# Patient Record
Sex: Female | Born: 1945
Health system: Southern US, Community
[De-identification: ages and names within clinical notes are randomized; demographics above are authoritative.]

## PROBLEM LIST (undated history)

## (undated) DIAGNOSIS — J309 Allergic rhinitis, unspecified: Secondary | ICD-10-CM

## (undated) DIAGNOSIS — I10 Essential (primary) hypertension: Secondary | ICD-10-CM

## (undated) DIAGNOSIS — K219 Gastro-esophageal reflux disease without esophagitis: Secondary | ICD-10-CM

## (undated) DIAGNOSIS — D649 Anemia, unspecified: Secondary | ICD-10-CM

## (undated) DIAGNOSIS — C8590 Non-Hodgkin lymphoma, unspecified, unspecified site: Secondary | ICD-10-CM

## (undated) DIAGNOSIS — M109 Gout, unspecified: Secondary | ICD-10-CM

## (undated) DIAGNOSIS — R7689 Other specified abnormal immunological findings in serum: Secondary | ICD-10-CM

## (undated) DIAGNOSIS — C801 Malignant (primary) neoplasm, unspecified: Secondary | ICD-10-CM

## (undated) DIAGNOSIS — E079 Disorder of thyroid, unspecified: Secondary | ICD-10-CM

## (undated) DIAGNOSIS — IMO0002 Reserved for concepts with insufficient information to code with codable children: Secondary | ICD-10-CM

## (undated) DIAGNOSIS — R768 Other specified abnormal immunological findings in serum: Secondary | ICD-10-CM

## (undated) HISTORY — PX: COLON SURGERY: SHX602

## (undated) HISTORY — PX: TUBAL LIGATION: SHX77

## (undated) HISTORY — DX: Disorder of thyroid, unspecified: E07.9

## (undated) HISTORY — DX: Anemia, unspecified: D64.9

## (undated) HISTORY — DX: Essential (primary) hypertension: I10

## (undated) HISTORY — DX: Allergic rhinitis, unspecified: J30.9

## (undated) HISTORY — PX: ABDOMINAL HYSTERECTOMY: SHX81

## (undated) HISTORY — DX: Gout, unspecified: M10.9

## (undated) HISTORY — DX: Reserved for concepts with insufficient information to code with codable children: IMO0002

## (undated) HISTORY — PX: PARTIAL COLECTOMY: SHX5273

## (undated) HISTORY — DX: Other specified abnormal immunological findings in serum: R76.89

## (undated) HISTORY — PX: BREAST REDUCTION SURGERY: SHX8

## (undated) HISTORY — DX: Gastro-esophageal reflux disease without esophagitis: K21.9

## (undated) HISTORY — DX: Other specified abnormal immunological findings in serum: R76.8

## (undated) HISTORY — DX: Non-Hodgkin lymphoma, unspecified, unspecified site: C85.90

---

## 1998-12-19 ENCOUNTER — Encounter (INDEPENDENT_AMBULATORY_CARE_PROVIDER_SITE_OTHER): Payer: Self-pay | Admitting: Specialist

## 1998-12-19 ENCOUNTER — Other Ambulatory Visit: Admission: RE | Admit: 1998-12-19 | Discharge: 1998-12-19 | Payer: Self-pay | Admitting: Plastic Surgery

## 2001-02-25 ENCOUNTER — Encounter: Payer: Self-pay | Admitting: Internal Medicine

## 2001-05-07 ENCOUNTER — Encounter: Payer: Self-pay | Admitting: Internal Medicine

## 2002-09-16 ENCOUNTER — Other Ambulatory Visit: Admission: RE | Admit: 2002-09-16 | Discharge: 2002-09-16 | Payer: Self-pay | Admitting: Gynecology

## 2005-04-07 ENCOUNTER — Other Ambulatory Visit: Admission: RE | Admit: 2005-04-07 | Discharge: 2005-04-07 | Payer: Self-pay | Admitting: Gynecology

## 2005-07-24 ENCOUNTER — Ambulatory Visit: Payer: Self-pay | Admitting: Internal Medicine

## 2005-07-30 ENCOUNTER — Ambulatory Visit: Payer: Self-pay | Admitting: Internal Medicine

## 2005-08-11 ENCOUNTER — Ambulatory Visit (HOSPITAL_COMMUNITY): Admission: RE | Admit: 2005-08-11 | Discharge: 2005-08-11 | Payer: Self-pay | Admitting: Internal Medicine

## 2005-08-11 IMAGING — US US SOFT TISSUE HEAD/NECK
1 series · 14 of 25 positions shown · non-contrast
Comparison: none

CLINICAL DATA: The patient has a right thyroid nodule.
 THYROID ULTRASOUND:
TECHNIQUE: Ultrasound examination of the thyroid gland and adjacent soft tissue structures was performed.   
 No comparison.

[Series 1: unknown · 0.07mm/px · 14 of 76 slices shown]
[im 1/76]
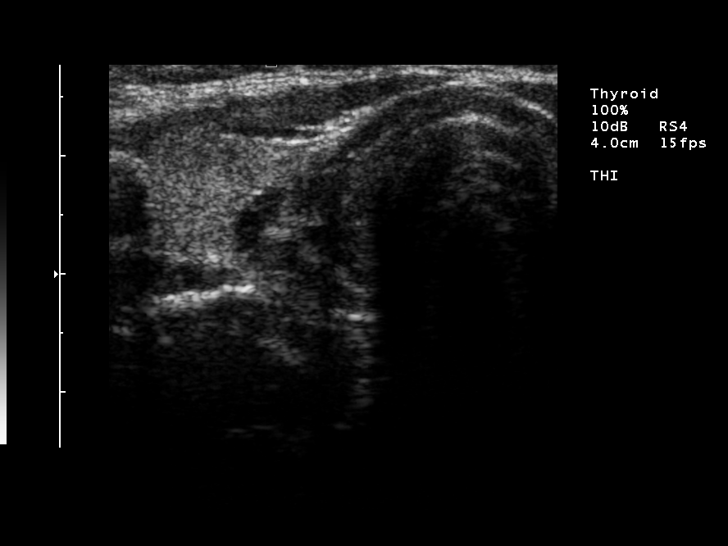
[im 7/76]
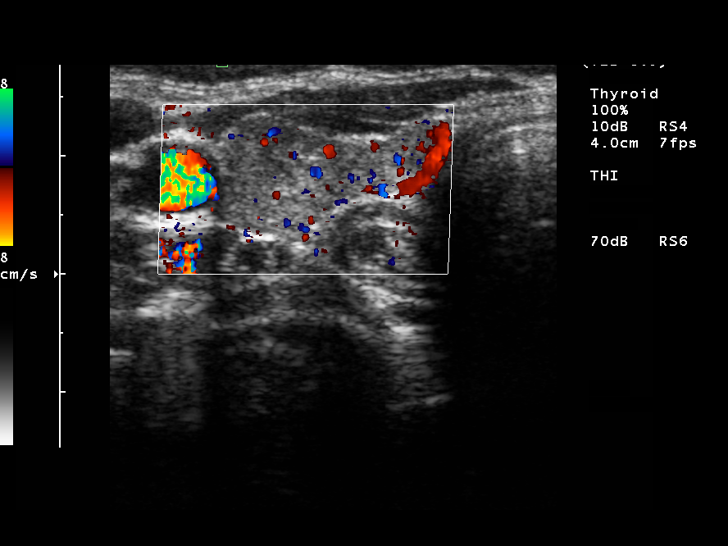
[im 13/76]
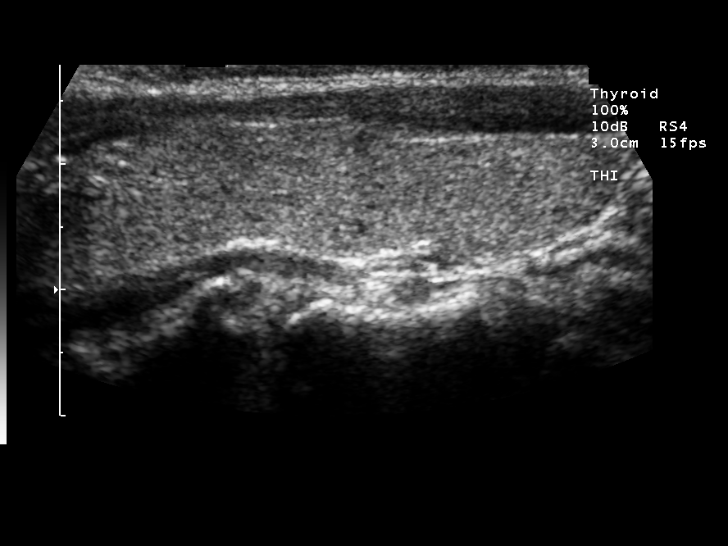
[im 19/76]
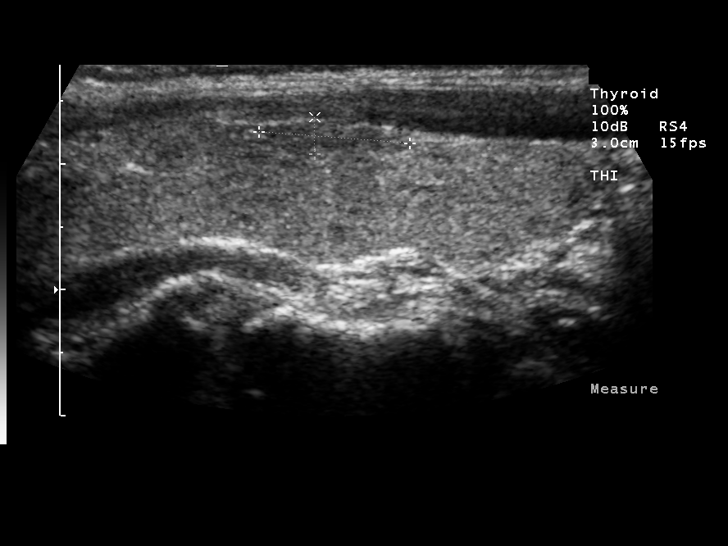
[im 26/76]
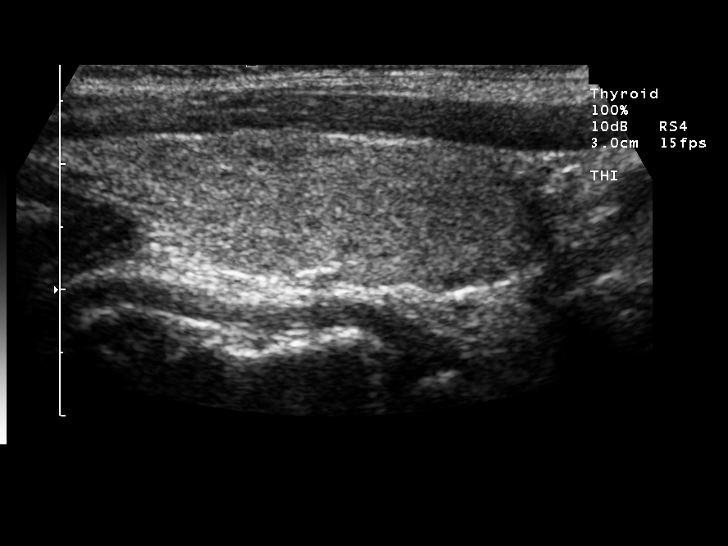
[im 29/76]
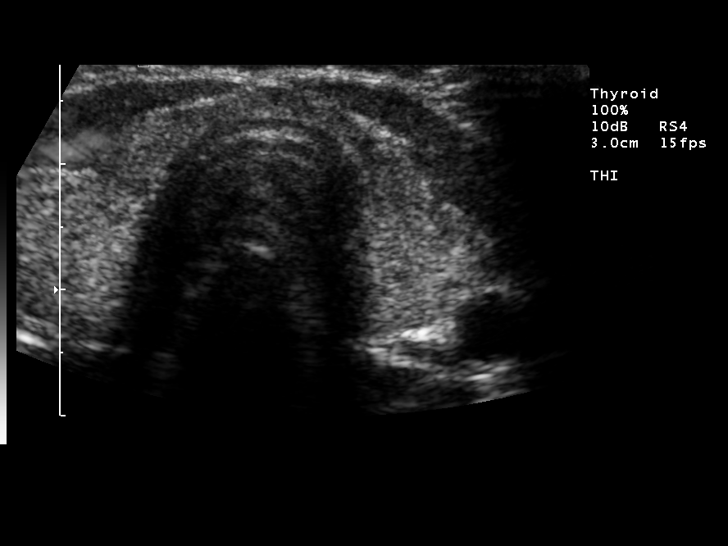
[im 35/76]
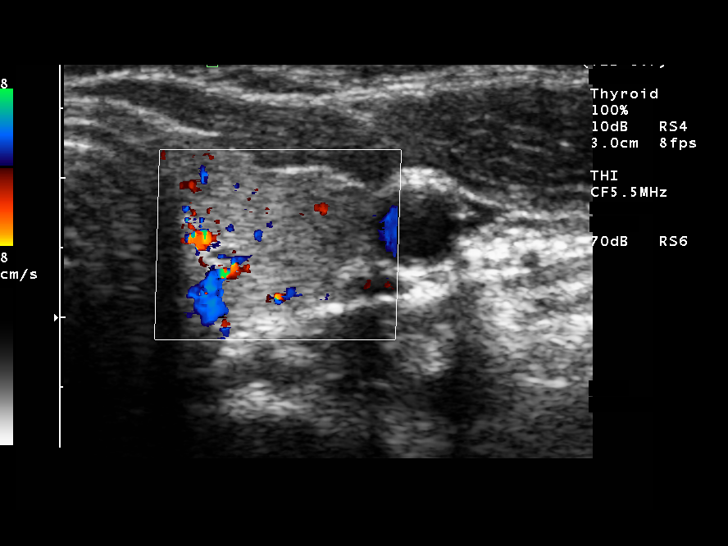
[im 41/76]
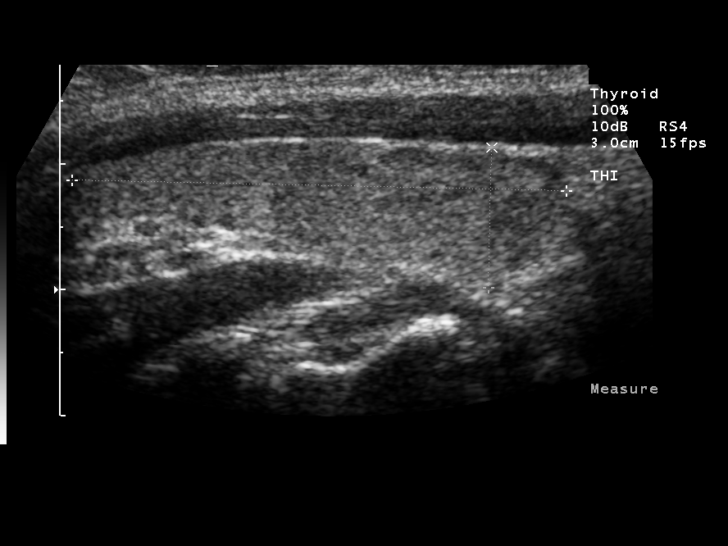
[im 47/76]
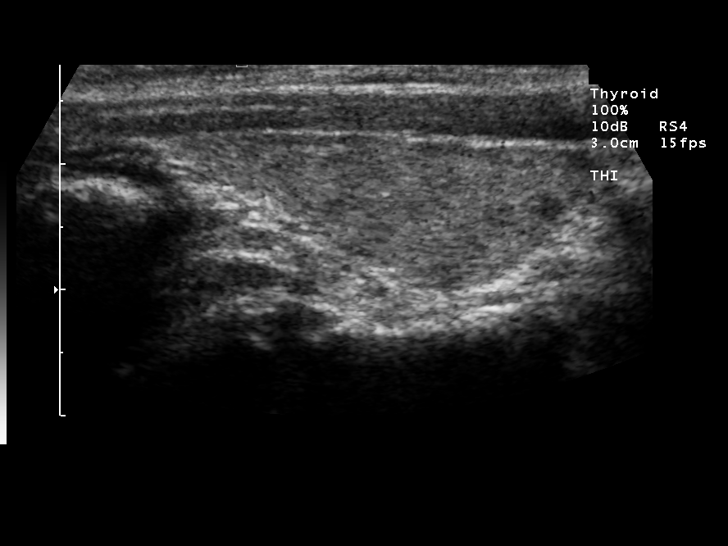
[im 51/76]
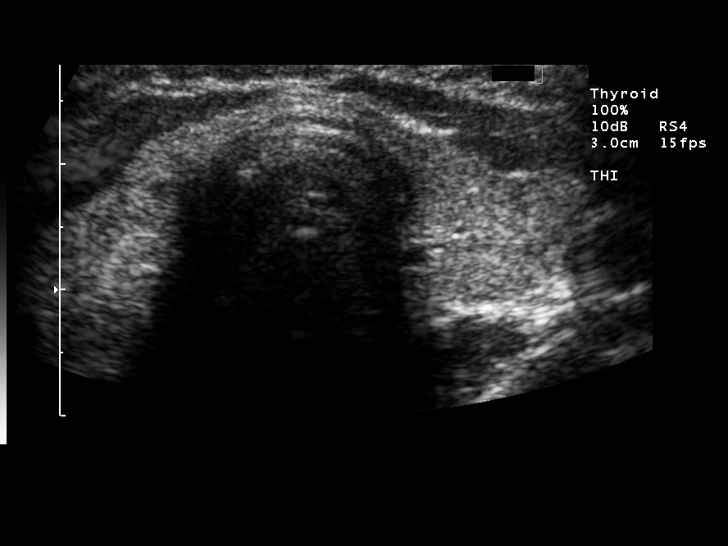
[im 57/76]
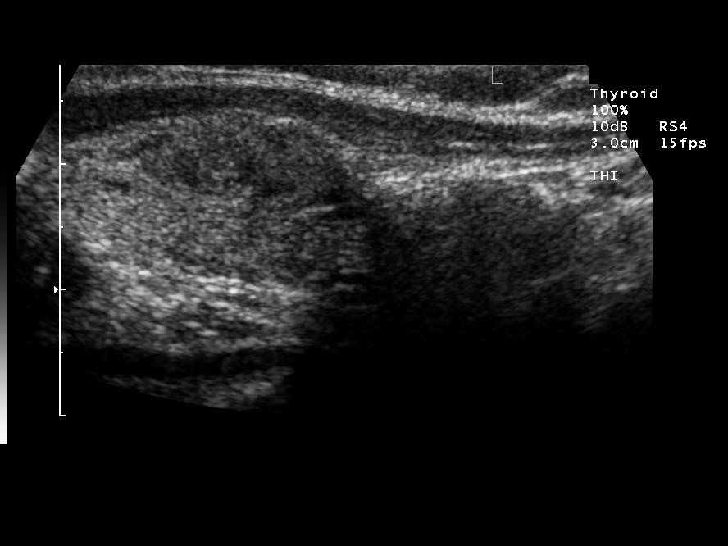
[im 63/76]
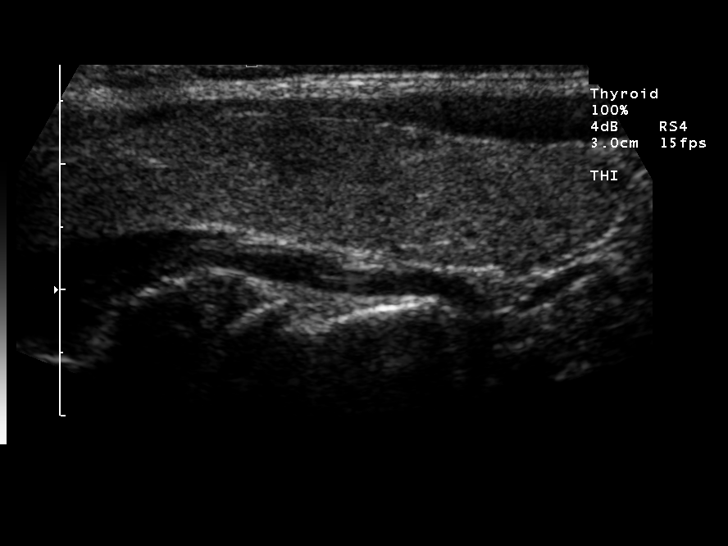
[im 69/76]
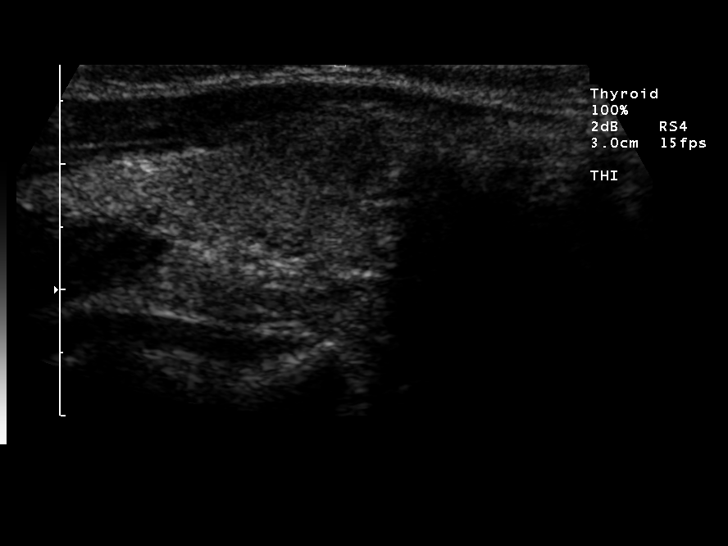
[im 76/76]
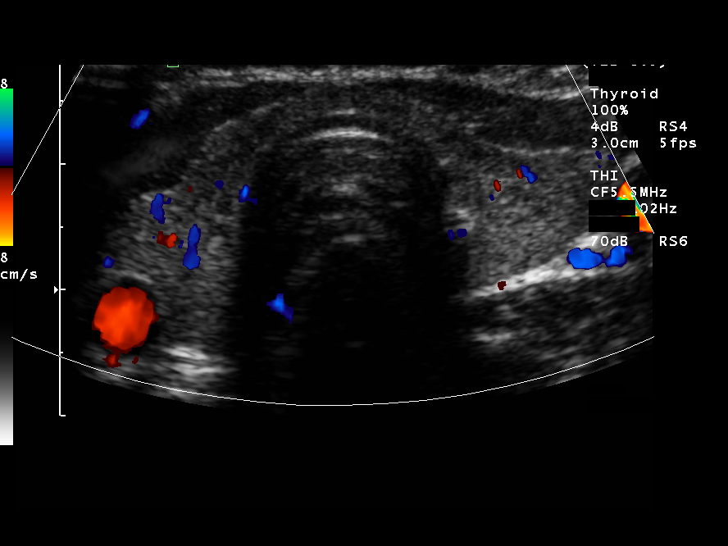

[14 of 25 positions shown; findings below may reference images not displayed]

FINDINGS: The right lobe of the thyroid measures 4.4 x 1.2 x 1.9 cm and the left lobe measures 3.9 x 1.1 x 1.3 cm.  In the right portion of the isthmus of the thyroid, there is a 10 x 6 x 7 mm solid nodule.  In the midportion of the right lobe, there is a 9 x 5 x 8 mm solid nodule.  The initial measurement was 1.6 cm in length however, this is thought to be an erroneous measurement.  
 There is a solid nodule laterally in the right midpole and this nodule measures 9 x 5 x 8 mm.  No discrete nodules are noted on the left.  The echo pattern is inhomogeneous.
IMPRESSION: Two solid nodules in the region of the right lobe, one laterally positioned measuring up to 9 mm and a second being medially positioned in the region of the isthmus measuring up to 10 mm.
 Follow-up ultrasound would be suggested in six months.  If there has been interval growth of these nodules, then biopsy would be suggested.

## 2006-03-06 ENCOUNTER — Ambulatory Visit: Payer: Self-pay | Admitting: Internal Medicine

## 2006-07-01 ENCOUNTER — Ambulatory Visit: Payer: Self-pay | Admitting: Internal Medicine

## 2006-07-24 HISTORY — PX: COLONOSCOPY: SHX174

## 2006-09-07 ENCOUNTER — Other Ambulatory Visit: Admission: RE | Admit: 2006-09-07 | Discharge: 2006-09-07 | Payer: Self-pay | Admitting: Gynecology

## 2007-01-04 ENCOUNTER — Ambulatory Visit: Payer: Self-pay | Admitting: Internal Medicine

## 2007-01-15 ENCOUNTER — Ambulatory Visit: Payer: Self-pay | Admitting: Internal Medicine

## 2007-01-15 LAB — HM COLONOSCOPY: HM Colonoscopy: NORMAL

## 2007-09-10 ENCOUNTER — Other Ambulatory Visit: Admission: RE | Admit: 2007-09-10 | Discharge: 2007-09-10 | Payer: Self-pay | Admitting: Gynecology

## 2007-09-17 ENCOUNTER — Encounter: Payer: Self-pay | Admitting: Internal Medicine

## 2007-12-01 ENCOUNTER — Encounter: Payer: Self-pay | Admitting: *Deleted

## 2007-12-01 DIAGNOSIS — I10 Essential (primary) hypertension: Secondary | ICD-10-CM

## 2007-12-01 DIAGNOSIS — J309 Allergic rhinitis, unspecified: Secondary | ICD-10-CM | POA: Insufficient documentation

## 2007-12-01 DIAGNOSIS — K921 Melena: Secondary | ICD-10-CM | POA: Insufficient documentation

## 2007-12-01 DIAGNOSIS — Z9189 Other specified personal risk factors, not elsewhere classified: Secondary | ICD-10-CM | POA: Insufficient documentation

## 2008-01-24 ENCOUNTER — Ambulatory Visit: Payer: Self-pay | Admitting: Internal Medicine

## 2008-01-24 DIAGNOSIS — M79609 Pain in unspecified limb: Secondary | ICD-10-CM

## 2008-01-24 LAB — CONVERTED CEMR LAB
ALT: 20 units/L (ref 0–35)
AST: 24 units/L (ref 0–37)
Basophils Absolute: 0 10*3/uL (ref 0.0–0.1)
Basophils Relative: 0.5 % (ref 0.0–3.0)
Calcium: 9.4 mg/dL (ref 8.4–10.5)
Eosinophils Absolute: 0.2 10*3/uL (ref 0.0–0.7)
Eosinophils Relative: 3 % (ref 0.0–5.0)
Glucose, Bld: 83 mg/dL (ref 70–99)
Hemoglobin, Urine: NEGATIVE
Lymphocytes Relative: 38.2 % (ref 12.0–46.0)
Monocytes Absolute: 0.5 10*3/uL (ref 0.1–1.0)
Mucus, UA: NEGATIVE
Neutro Abs: 3 10*3/uL (ref 1.4–7.7)
Nitrite: NEGATIVE
Platelets: 336 10*3/uL (ref 150–400)
Potassium: 4.4 meq/L (ref 3.5–5.1)
RBC / HPF: NONE SEEN
RDW: 13.4 % (ref 11.5–14.6)
Specific Gravity, Urine: 1.025 (ref 1.000–1.03)
TSH: 4.1 microintl units/mL (ref 0.35–5.50)
Total Protein, Urine: NEGATIVE mg/dL
Urine Glucose: NEGATIVE mg/dL

## 2008-03-24 ENCOUNTER — Ambulatory Visit: Payer: Self-pay | Admitting: Internal Medicine

## 2008-03-24 DIAGNOSIS — R109 Unspecified abdominal pain: Secondary | ICD-10-CM | POA: Insufficient documentation

## 2008-03-24 DIAGNOSIS — R61 Generalized hyperhidrosis: Secondary | ICD-10-CM

## 2008-03-26 LAB — CONVERTED CEMR LAB
BUN: 10 mg/dL (ref 6–23)
Basophils Absolute: 0.1 10*3/uL (ref 0.0–0.1)
Crystals: NEGATIVE
GFR calc Af Amer: 94 mL/min
GFR calc non Af Amer: 78 mL/min
HCT: 41.7 % (ref 36.0–46.0)
Lymphocytes Relative: 20 % (ref 12.0–46.0)
MCHC: 34.9 g/dL (ref 30.0–36.0)
Monocytes Absolute: 0.6 10*3/uL (ref 0.1–1.0)
Neutro Abs: 4.7 10*3/uL (ref 1.4–7.7)
Neutrophils Relative %: 70.9 % (ref 43.0–77.0)
Nitrite: NEGATIVE
Platelets: 333 10*3/uL (ref 150–400)
Potassium: 3.5 meq/L (ref 3.5–5.1)
Sed Rate: 46 mm/hr — ABNORMAL HIGH (ref 0–22)
Sodium: 137 meq/L (ref 135–145)
Total Protein, Urine: 30 mg/dL — AB
pH: 5.5 (ref 5.0–8.0)

## 2008-04-18 ENCOUNTER — Encounter: Payer: Self-pay | Admitting: Internal Medicine

## 2008-06-12 ENCOUNTER — Telehealth: Payer: Self-pay | Admitting: Internal Medicine

## 2008-06-13 ENCOUNTER — Telehealth: Payer: Self-pay | Admitting: Internal Medicine

## 2008-06-19 ENCOUNTER — Ambulatory Visit: Payer: Self-pay | Admitting: Internal Medicine

## 2008-06-19 DIAGNOSIS — R198 Other specified symptoms and signs involving the digestive system and abdomen: Secondary | ICD-10-CM | POA: Insufficient documentation

## 2008-09-18 ENCOUNTER — Ambulatory Visit: Payer: Self-pay | Admitting: Women's Health

## 2008-09-18 ENCOUNTER — Encounter: Payer: Self-pay | Admitting: Women's Health

## 2008-09-18 ENCOUNTER — Other Ambulatory Visit: Admission: RE | Admit: 2008-09-18 | Discharge: 2008-09-18 | Payer: Self-pay | Admitting: Gynecology

## 2008-09-29 ENCOUNTER — Telehealth: Payer: Self-pay | Admitting: Internal Medicine

## 2008-09-29 ENCOUNTER — Encounter: Payer: Self-pay | Admitting: Internal Medicine

## 2008-12-21 ENCOUNTER — Ambulatory Visit: Payer: Self-pay | Admitting: Internal Medicine

## 2008-12-21 DIAGNOSIS — M549 Dorsalgia, unspecified: Secondary | ICD-10-CM

## 2009-03-19 ENCOUNTER — Ambulatory Visit: Payer: Self-pay | Admitting: Internal Medicine

## 2009-03-19 DIAGNOSIS — R609 Edema, unspecified: Secondary | ICD-10-CM | POA: Insufficient documentation

## 2009-05-07 ENCOUNTER — Encounter: Payer: Self-pay | Admitting: Internal Medicine

## 2009-11-05 ENCOUNTER — Other Ambulatory Visit: Admission: RE | Admit: 2009-11-05 | Discharge: 2009-11-05 | Payer: Self-pay | Admitting: Gynecology

## 2009-11-05 ENCOUNTER — Ambulatory Visit: Payer: Self-pay | Admitting: Women's Health

## 2009-11-23 ENCOUNTER — Ambulatory Visit: Payer: Self-pay | Admitting: Internal Medicine

## 2009-11-23 DIAGNOSIS — R110 Nausea: Secondary | ICD-10-CM | POA: Insufficient documentation

## 2009-11-23 DIAGNOSIS — A048 Other specified bacterial intestinal infections: Secondary | ICD-10-CM | POA: Insufficient documentation

## 2009-11-23 DIAGNOSIS — R1013 Epigastric pain: Secondary | ICD-10-CM

## 2009-11-24 LAB — CONVERTED CEMR LAB
AST: 18 units/L (ref 0–37)
Albumin: 3.8 g/dL (ref 3.5–5.2)
Alkaline Phosphatase: 63 units/L (ref 39–117)
BUN: 10 mg/dL (ref 6–23)
Basophils Relative: 0.4 % (ref 0.0–3.0)
Bilirubin, Direct: 0.1 mg/dL (ref 0.0–0.3)
CO2: 30 meq/L (ref 19–32)
Chloride: 106 meq/L (ref 96–112)
Eosinophils Absolute: 0 10*3/uL (ref 0.0–0.7)
Eosinophils Relative: 0.7 % (ref 0.0–5.0)
GFR calc non Af Amer: 110.98 mL/min (ref >60)
H Pylori IgG: POSITIVE
Ketones, ur: NEGATIVE mg/dL
Lipase: 1 units/L — ABNORMAL LOW (ref 11.0–59.0)
Lymphocytes Relative: 28.6 % (ref 12.0–46.0)
Lymphs Abs: 1.7 10*3/uL (ref 0.7–4.0)
Monocytes Absolute: 0.5 10*3/uL (ref 0.1–1.0)
Monocytes Relative: 8.3 % (ref 3.0–12.0)
Neutrophils Relative %: 62 % (ref 43.0–77.0)
Sodium: 143 meq/L (ref 135–145)
Specific Gravity, Urine: 1.02 (ref 1.000–1.030)
Total Protein, Urine: NEGATIVE mg/dL
pH: 5.5 (ref 5.0–8.0)

## 2009-12-11 ENCOUNTER — Ambulatory Visit: Payer: Self-pay | Admitting: Women's Health

## 2009-12-12 ENCOUNTER — Encounter (INDEPENDENT_AMBULATORY_CARE_PROVIDER_SITE_OTHER): Payer: Self-pay | Admitting: *Deleted

## 2009-12-12 ENCOUNTER — Encounter: Admission: RE | Admit: 2009-12-12 | Discharge: 2009-12-12 | Payer: Self-pay | Admitting: Internal Medicine

## 2009-12-12 IMAGING — US US ABDOMEN COMPLETE
1 series · 14 of 25 positions shown · non-contrast
Comparison: None.

CLINICAL DATA: Nausea/epigastric and abdominal pain.

COMPLETE ABDOMINAL ULTRASOUND

[Series 1: us abdomen complete · 0.30mm/px · 14 of 77 slices shown]
[im 1/77]
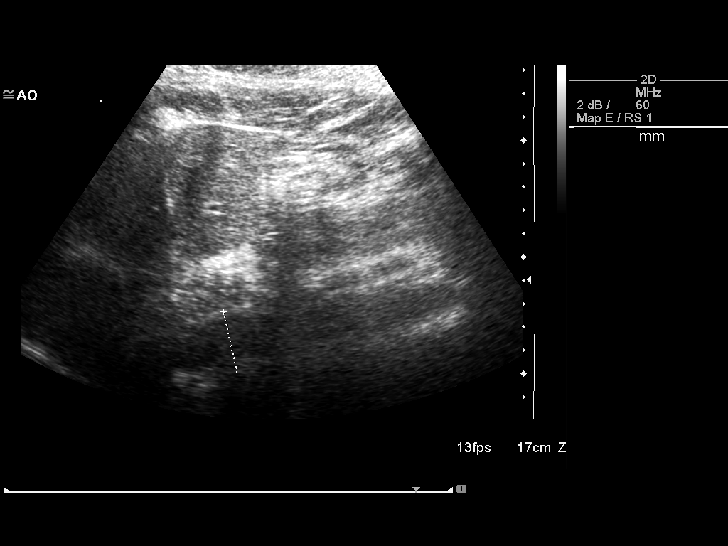
[im 7/77]
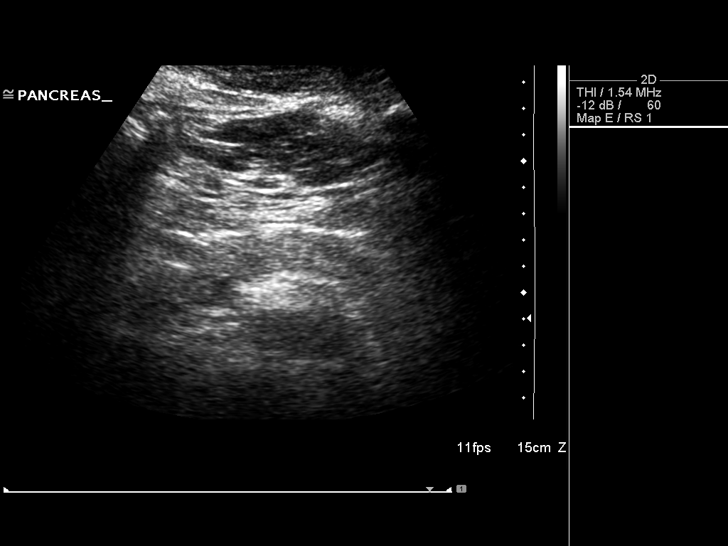
[im 13/77]
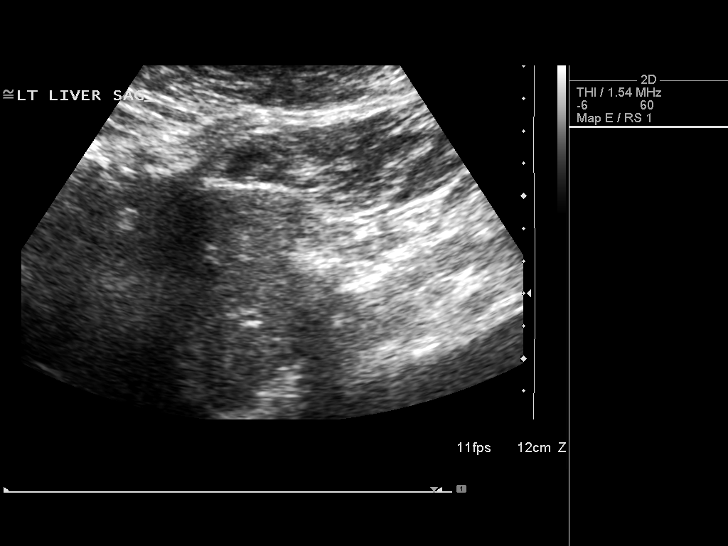
[im 20/77]
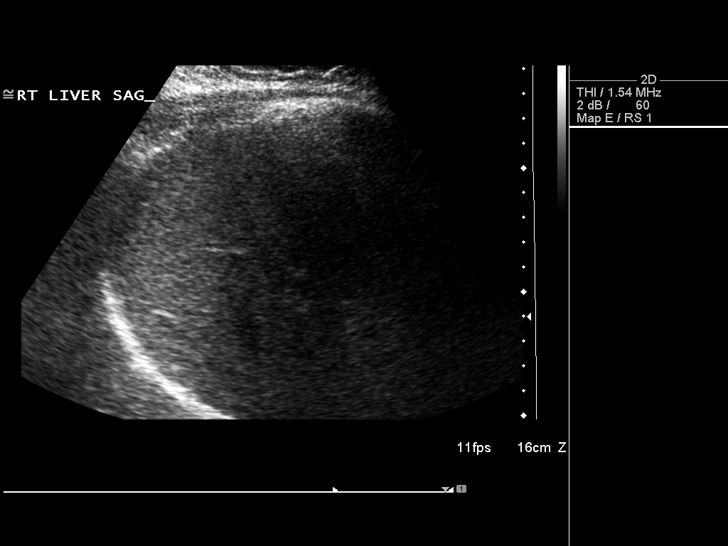
[im 26/77]
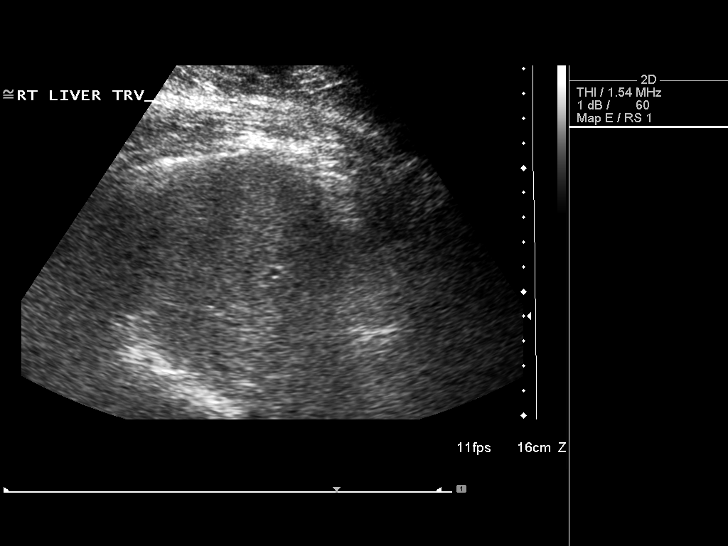
[im 29/77]
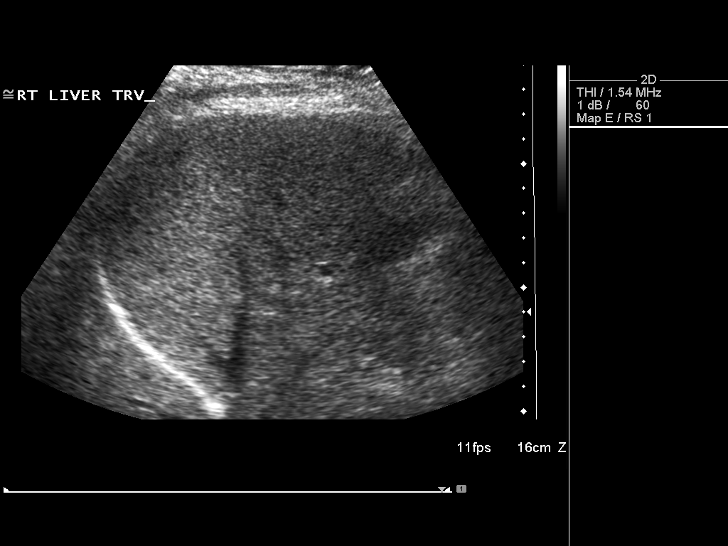
[im 35/77]
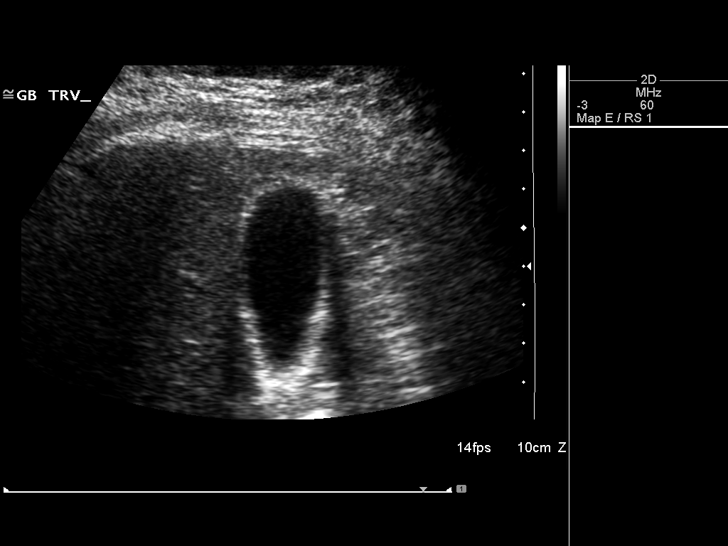
[im 42/77]
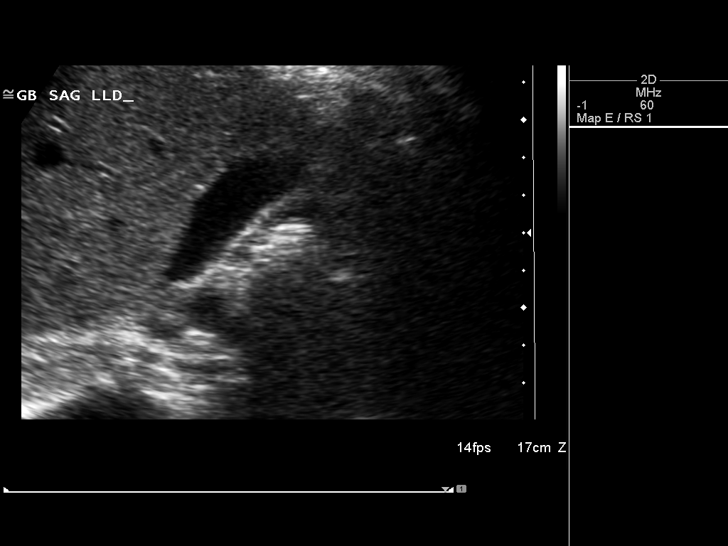
[im 48/77]
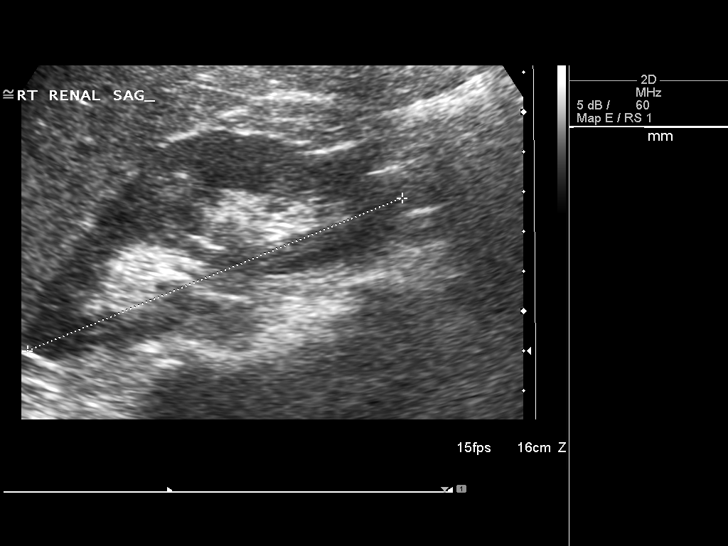
[im 51/77]
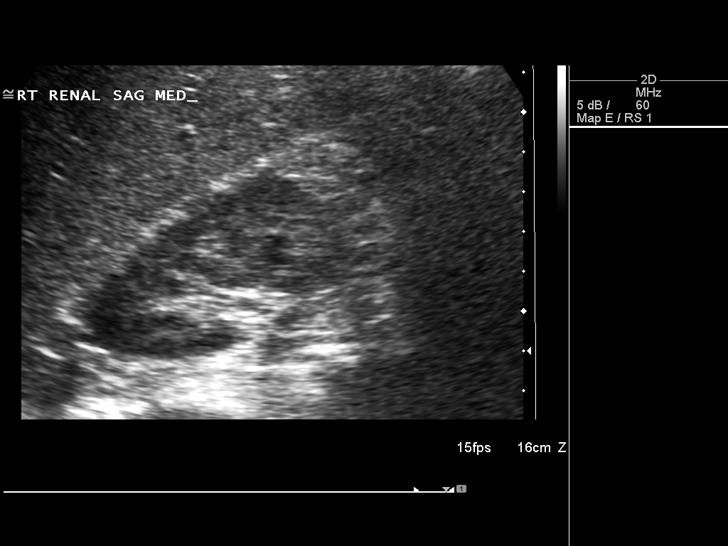
[im 58/77]
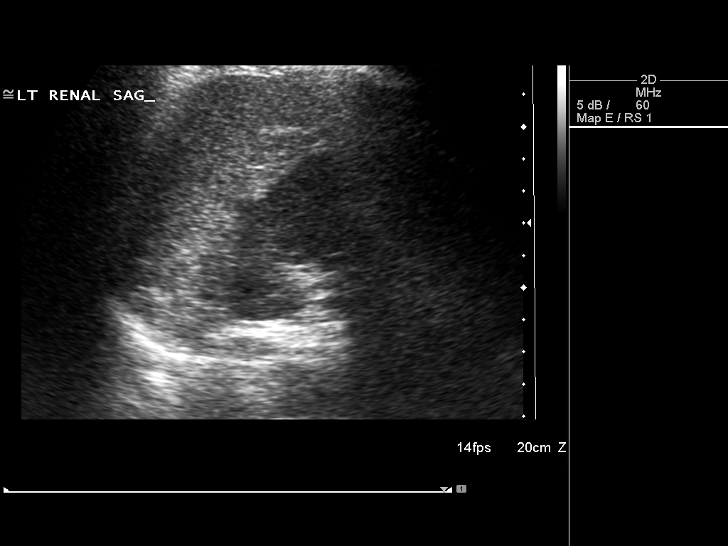
[im 64/77]
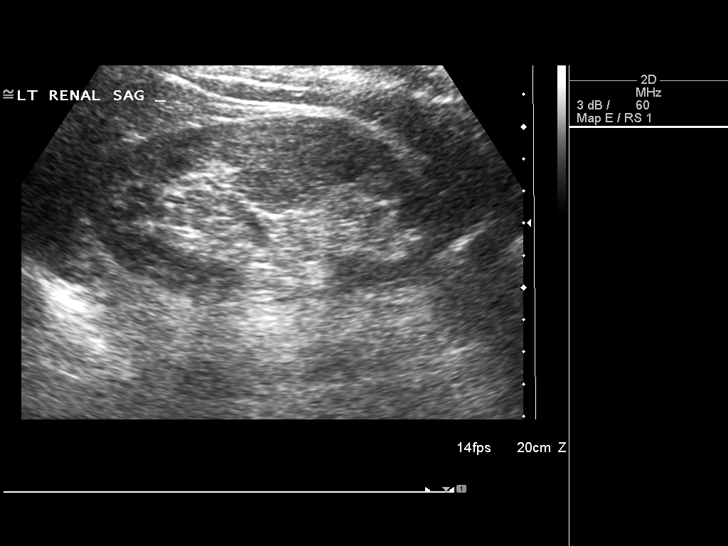
[im 70/77]
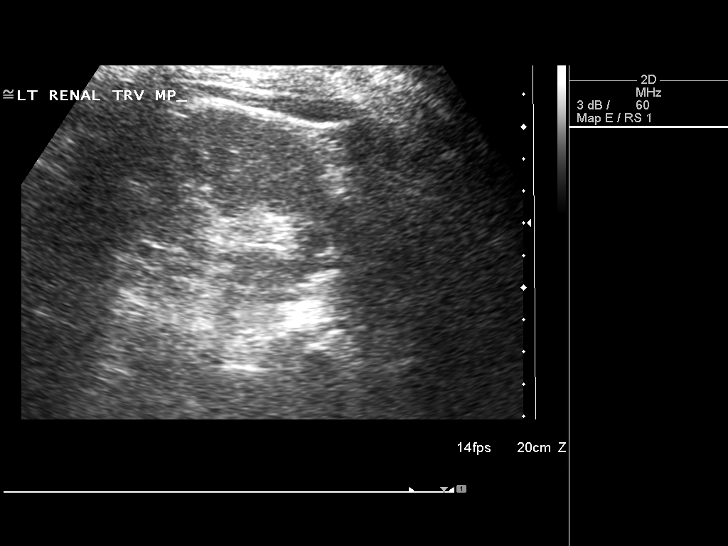
[im 77/77]
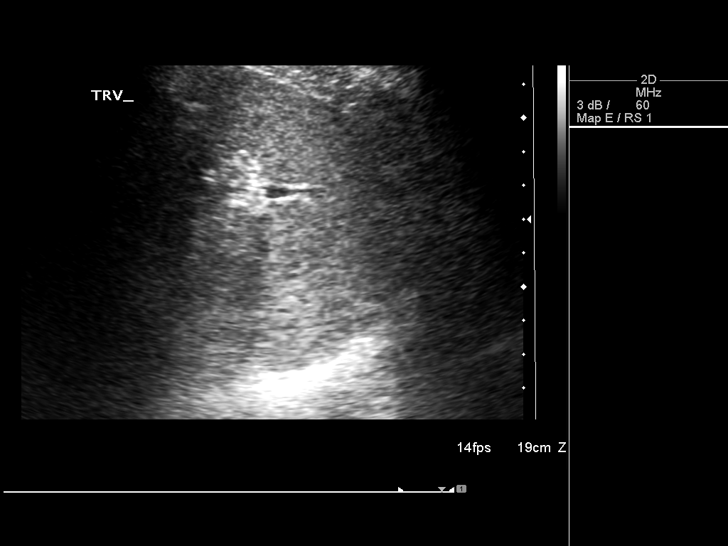

[14 of 25 positions shown; findings below may reference images not displayed]

FINDINGS: Gallbladder:  No gallstones, gallbladder wall thickening, or
pericholecystic fluid.

Common bile duct:  Measures 4.5 mm in diameter.

Liver:  No focal lesion identified.  Within normal limits in
parenchymal echogenicity.

IVC:  Appears normal.

Pancreas:  No focal abnormality seen.

Spleen:  Measures 6.2 cm in length.  No splenic lesions.

Right Kidney:  Measures 10.2 cm in length.  No renal mass or
hydronephrosis.

Left Kidney:  Measures 11.8 cm in length.  No renal mass or
hydronephrosis.

Abdominal aorta:  No aneurysm identified. Maximum diameter
abdominal aorta is 2.6 cm.
IMPRESSION: Negative abdominal ultrasound.

## 2009-12-21 DIAGNOSIS — C8590 Non-Hodgkin lymphoma, unspecified, unspecified site: Secondary | ICD-10-CM

## 2009-12-21 HISTORY — DX: Non-Hodgkin lymphoma, unspecified, unspecified site: C85.90

## 2009-12-28 ENCOUNTER — Encounter (INDEPENDENT_AMBULATORY_CARE_PROVIDER_SITE_OTHER): Payer: Self-pay | Admitting: *Deleted

## 2009-12-28 ENCOUNTER — Ambulatory Visit: Payer: Self-pay | Admitting: Internal Medicine

## 2009-12-28 DIAGNOSIS — K649 Unspecified hemorrhoids: Secondary | ICD-10-CM | POA: Insufficient documentation

## 2009-12-28 DIAGNOSIS — D509 Iron deficiency anemia, unspecified: Secondary | ICD-10-CM

## 2009-12-29 ENCOUNTER — Inpatient Hospital Stay (HOSPITAL_COMMUNITY)
Admission: EM | Admit: 2009-12-29 | Discharge: 2010-01-02 | Payer: Self-pay | Source: Home / Self Care | Admitting: Emergency Medicine

## 2009-12-29 ENCOUNTER — Encounter (INDEPENDENT_AMBULATORY_CARE_PROVIDER_SITE_OTHER): Payer: Self-pay | Admitting: *Deleted

## 2009-12-29 IMAGING — CT CT ABD-PELV W/ CM
2 of 5 series · 17 of 46 positions shown, 19 images · IV contrast (APPLIED)
Comparison: None

CLINICAL DATA: Abdominal pain, fever, history diverticulitis

CT ABDOMEN AND PELVIS WITH CONTRAST
TECHNIQUE: Multidetector CT imaging of the abdomen and pelvis was
performed following the standard protocol during bolus
administration of intravenous contrast. Breast shield utilized.
Sagittal and coronal MPR images reconstructed from axial data set.
Contrast: Dilute oral contrast and 125 ml [I7] IV

[Series 2: abd_pel 5.0 b40f st · axial · 0.73mm/px · z∈[-275,+115]mm · 14 of 88 slices shown, 16 images]
[im 5/88  soft-tissue]
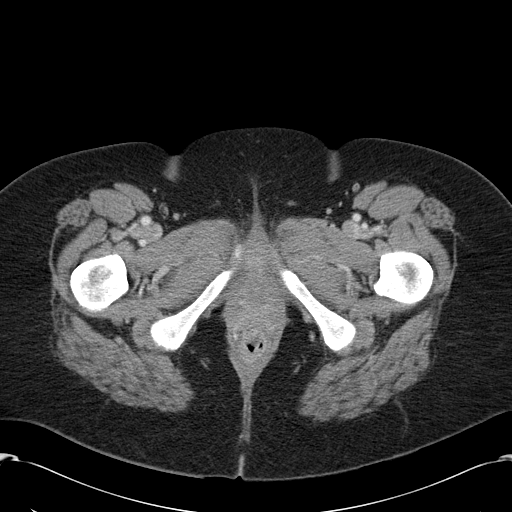
[im 5/88  bone]
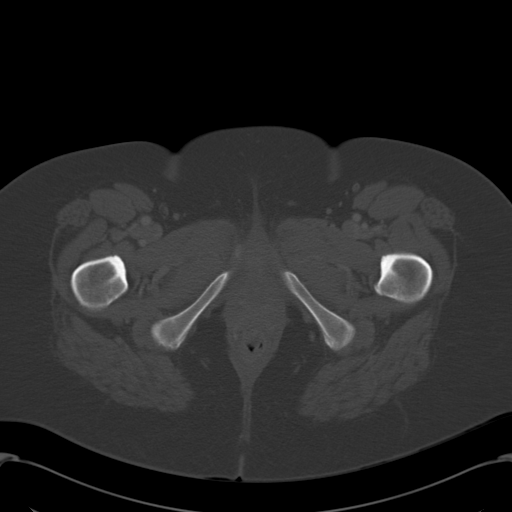
[im 14/88  soft-tissue]
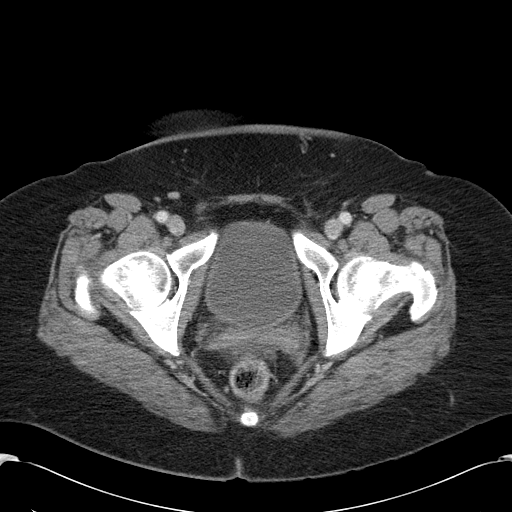
[im 18/88  soft-tissue]
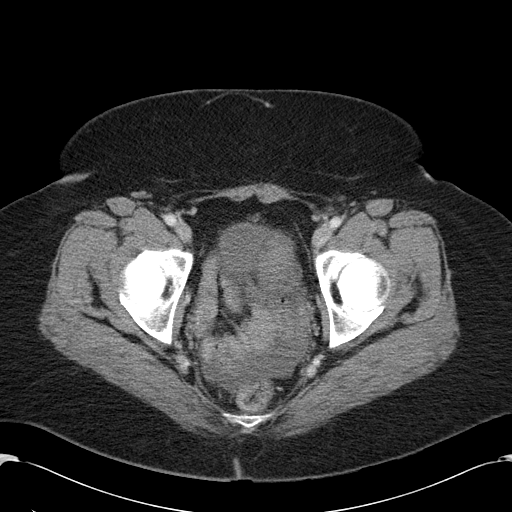
[im 22/88  soft-tissue]
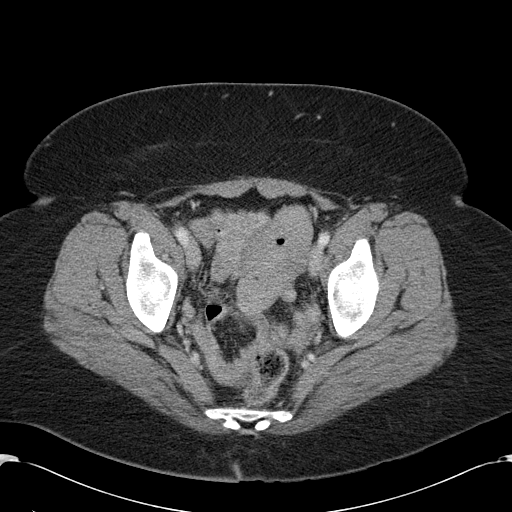
[im 31/88  soft-tissue]
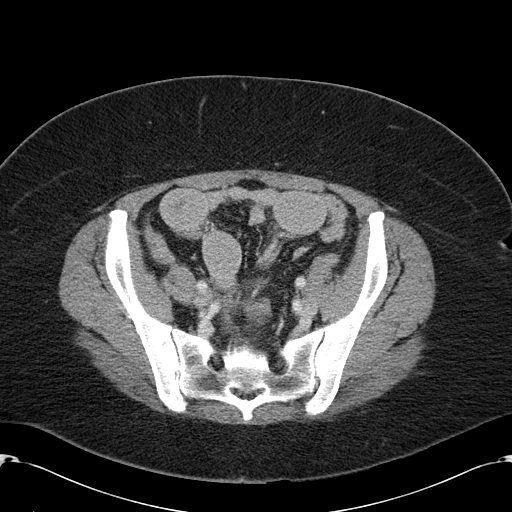
[im 35/88  soft-tissue]
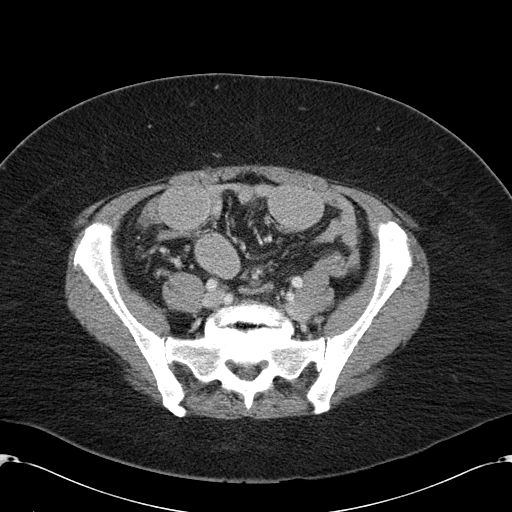
[im 40/88  soft-tissue]
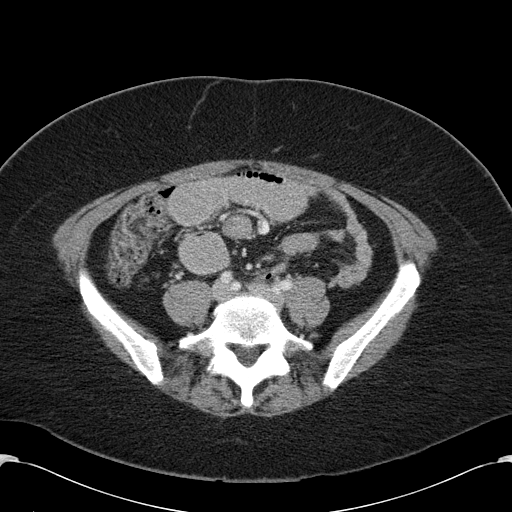
[im 48/88  soft-tissue]
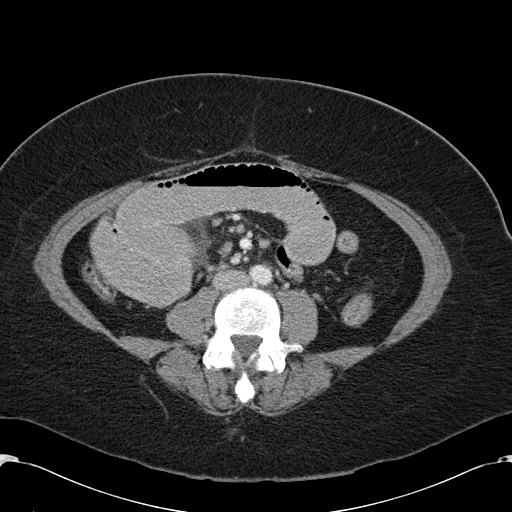
[im 53/88  soft-tissue]
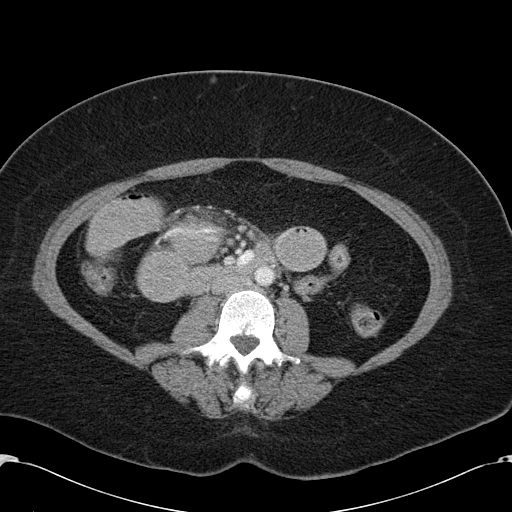
[im 53/88  bone]
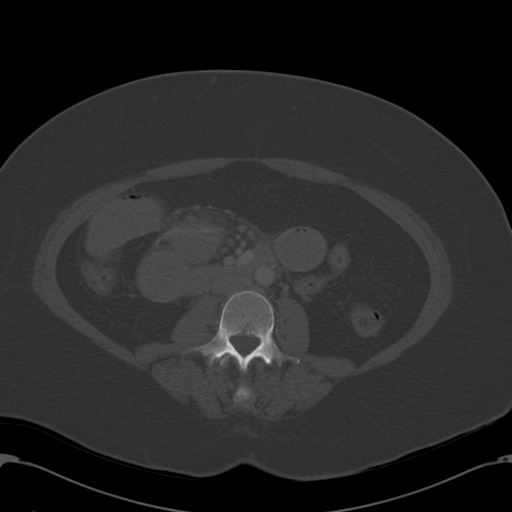
[im 57/88  soft-tissue]
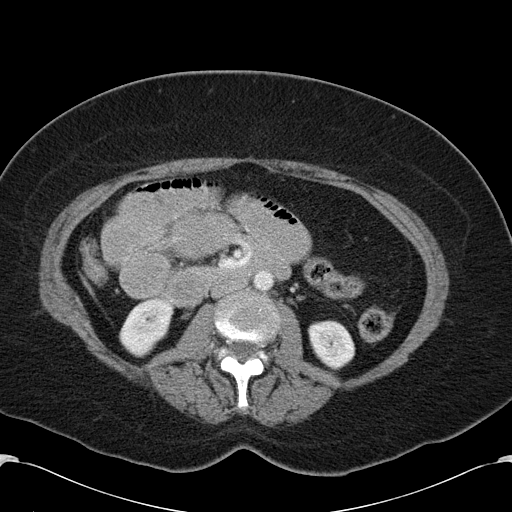
[im 66/88  soft-tissue]
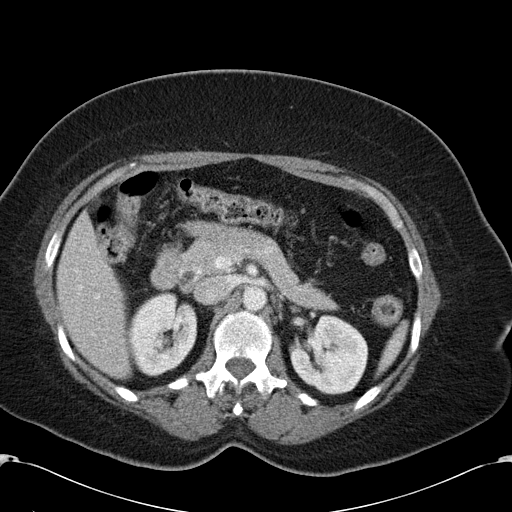
[im 70/88  soft-tissue]
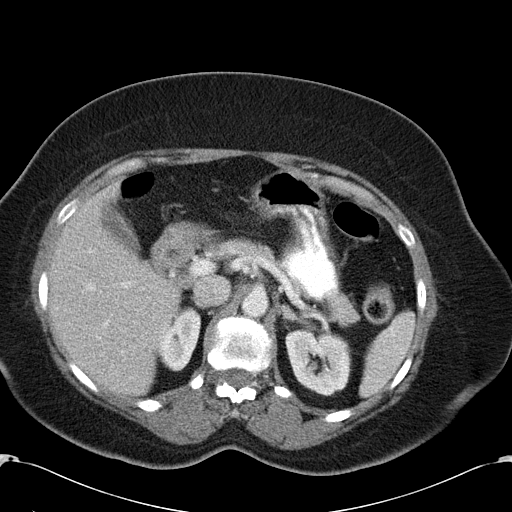
[im 74/88  soft-tissue]
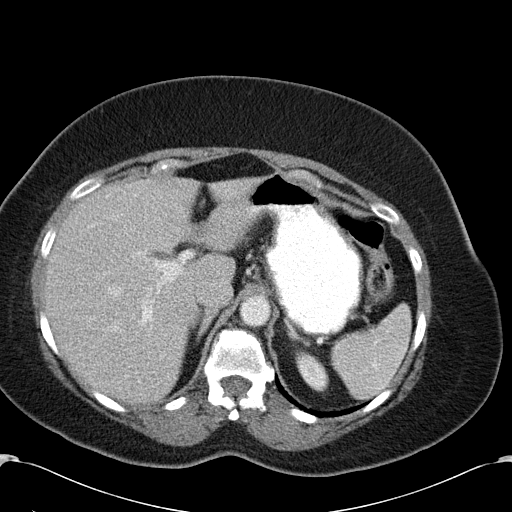
[im 83/88  soft-tissue]
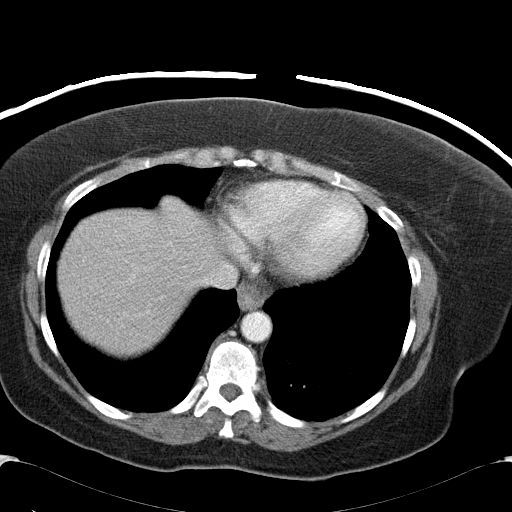

[Series 602: coronal · coronal · 0.90mm/px · 3 of 71 slices shown]
[im 24/71  soft-tissue]
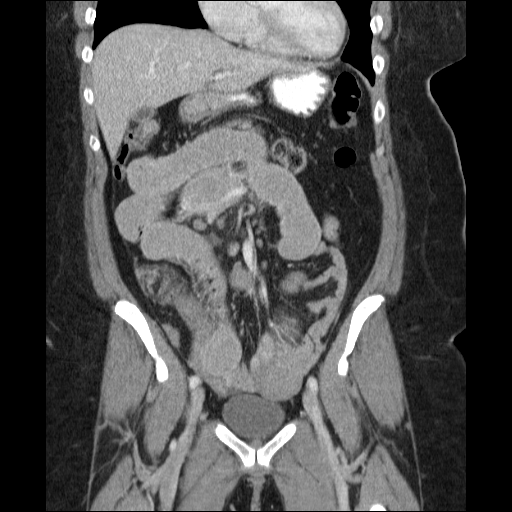
[im 32/71  soft-tissue]
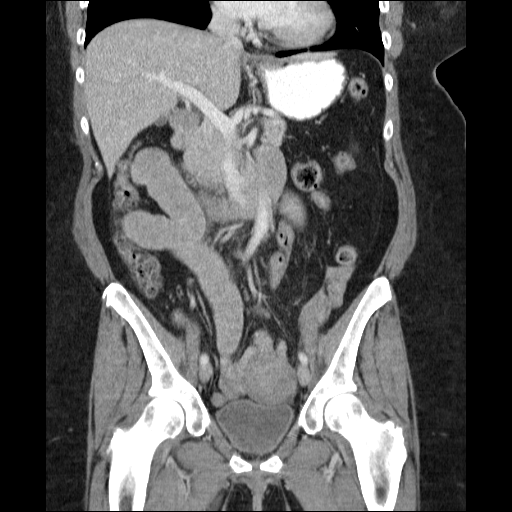
[im 39/71  soft-tissue]
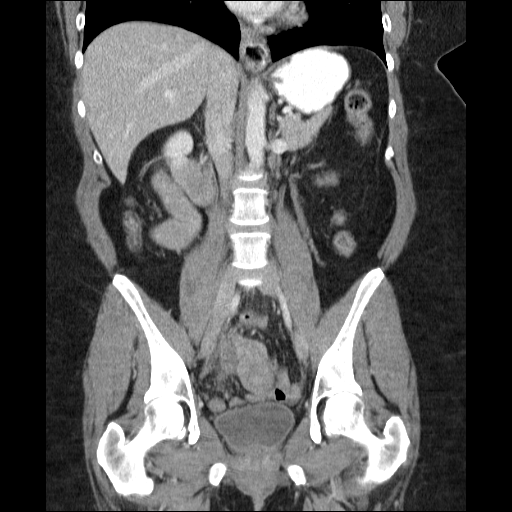

[17 of 46 positions shown; findings below may reference images not displayed]

FINDINGS: Lung bases clear.
Question tiny hiatal hernia.
Liver, spleen, pancreas, kidneys, and left adrenal gland normal.
Questionable tiny nonspecific nodule right adrenal gland 11 x 7 mm
image 14.
Stomach and colon unremarkable.
Small amount of nonspecific free pelvic fluid.
Dilated proximal and decompressed distal small bowel loops
compatible with small bowel obstruction.
Bowel dilatation terminates at a poorly defined soft tissue mass in
the left pelvis, measuring approximately 4.8 x 4.7 x 5.4 cm,
uncertain if this represents a thickened small bowel segment or a
soft tissue mass arising from small bowel, less likely mass of left
ovarian origin.
Small bowel beyond this site is decompressed with normal wall
thickness.
No hernia or adenopathy.
Uterus surgically absent.
No acute bony findings.
IMPRESSION: Mid to distal small bowel obstruction secondary to a soft tissue
mass in the left pelvis, [DATE] x 4.7 x 5.4 cm, question small bowel
mass versus less likely significantly thickened small bowel
segment.
Small amount of nonspecific free pelvic fluid.

## 2009-12-30 ENCOUNTER — Encounter (INDEPENDENT_AMBULATORY_CARE_PROVIDER_SITE_OTHER): Payer: Self-pay | Admitting: *Deleted

## 2009-12-30 IMAGING — US US PELVIS COMPLETE MODIFY
1 series · 13 of 25 positions shown · non-contrast
Comparison: CT scan [DATE].

CLINICAL DATA: A small bowel obstruction.  Evaluate for pelvic
mass.



[Series 1: us pelvis complete modify · 0.31mm/px · 37 acquisitions, 13 frames shown]
[im 1/37]
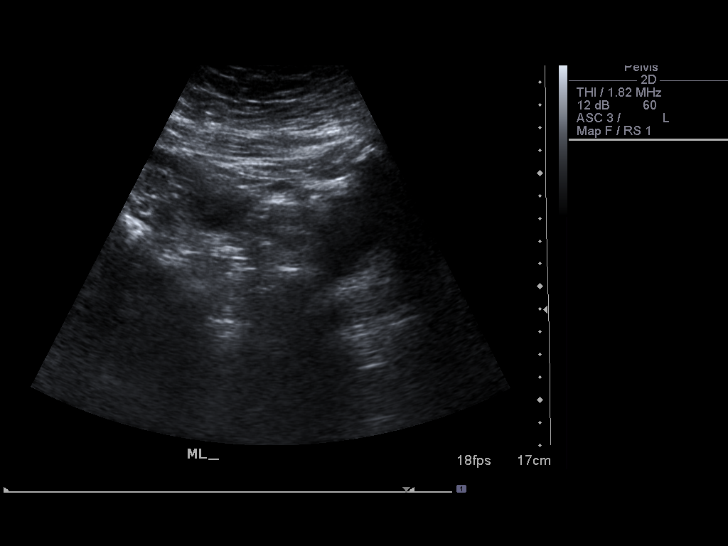
[im 4/37]
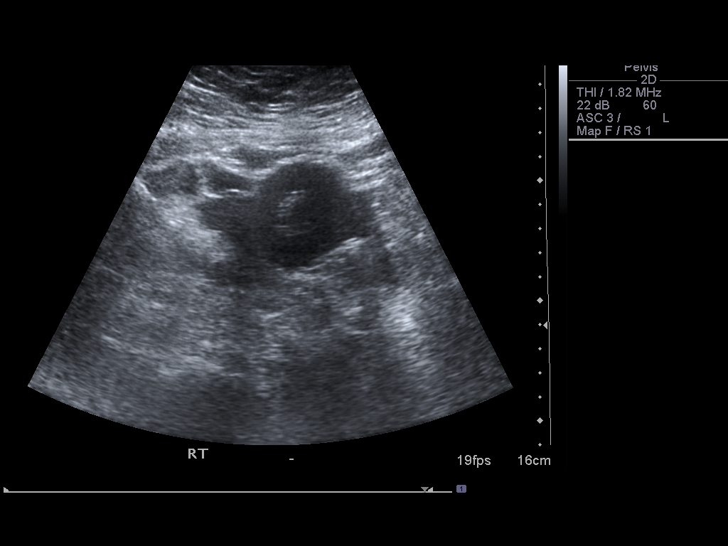
[im 7/37]
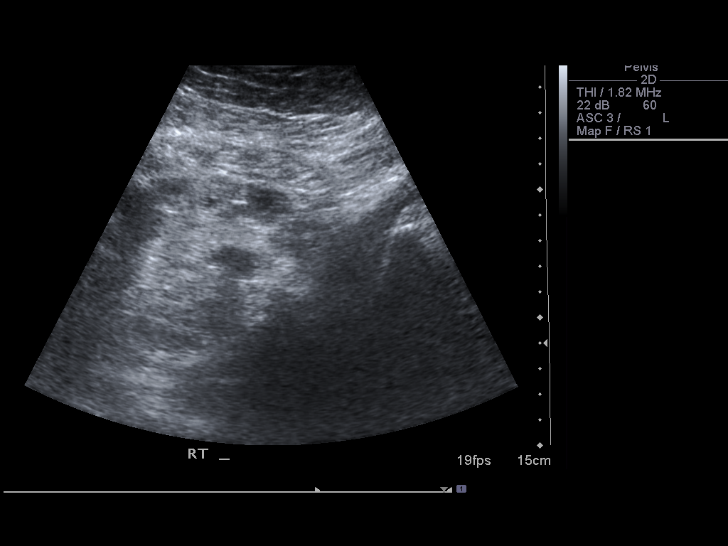
[im 10/37]
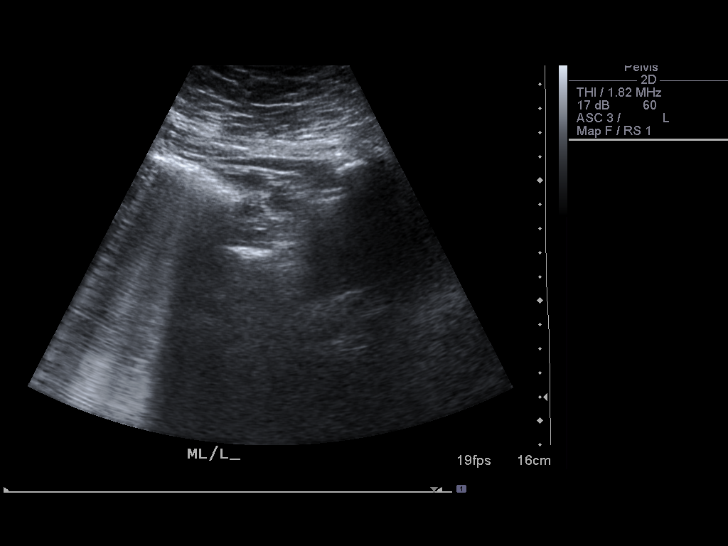
[im 13/37]
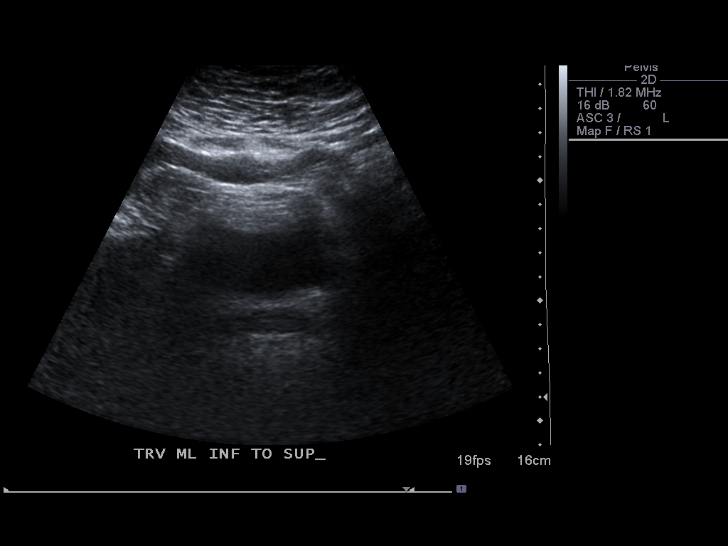
[im 16/37]
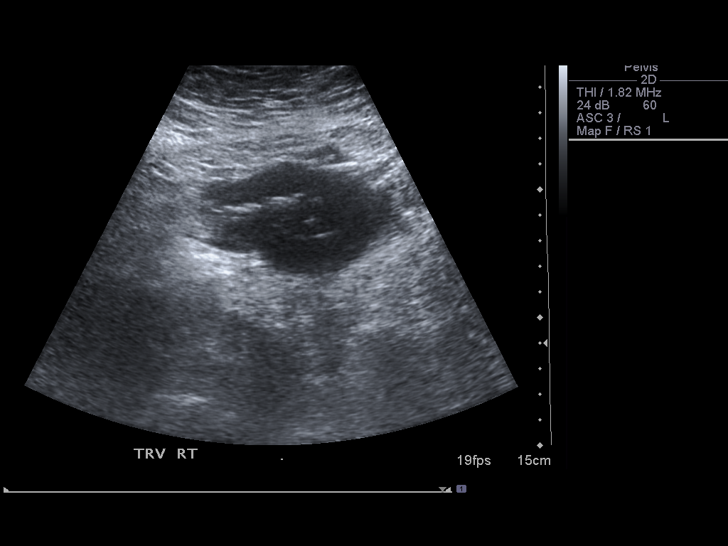
[im 19/37]
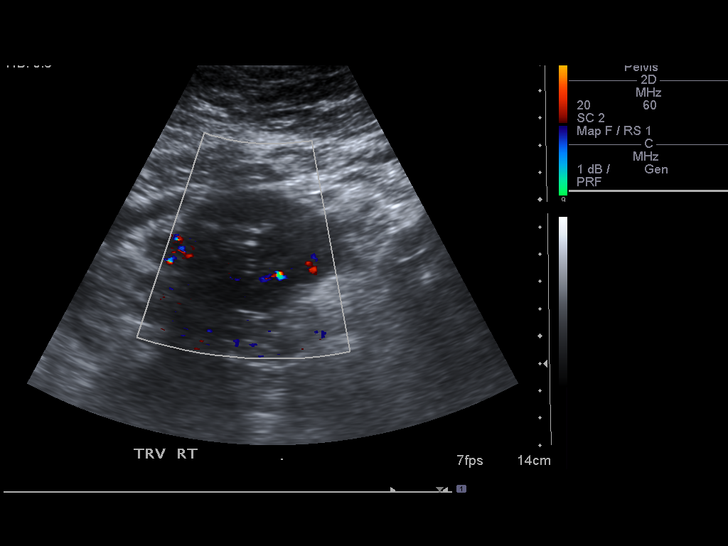
[im 22/37]
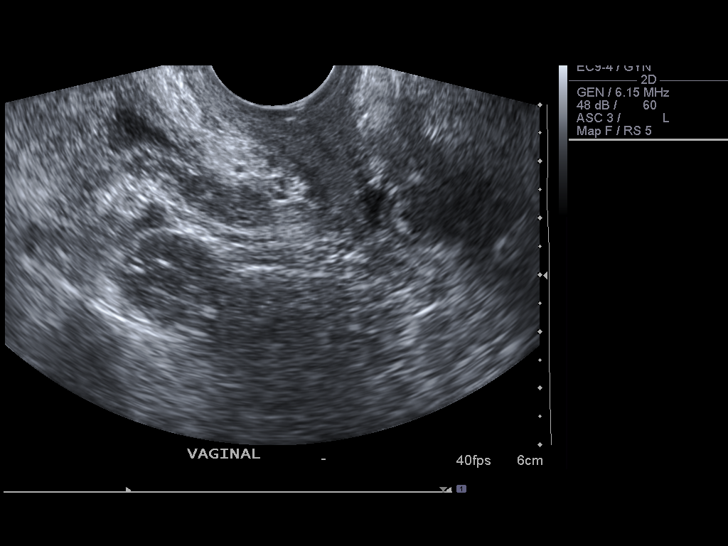
[im 25/37]
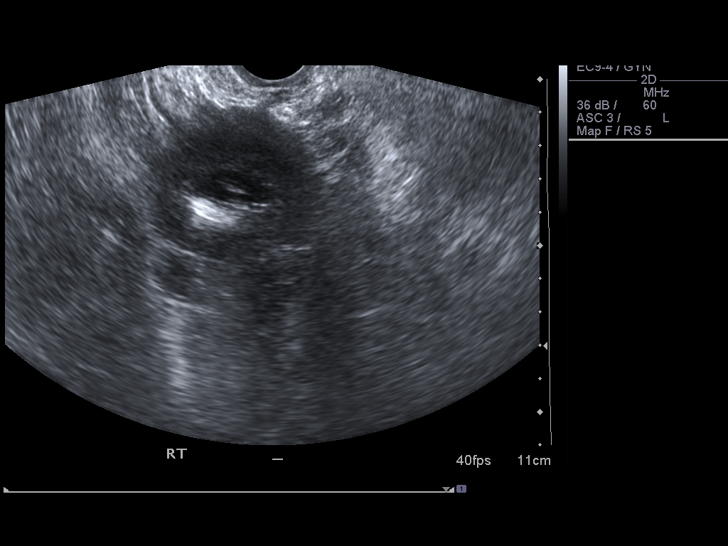
[im 28/37]
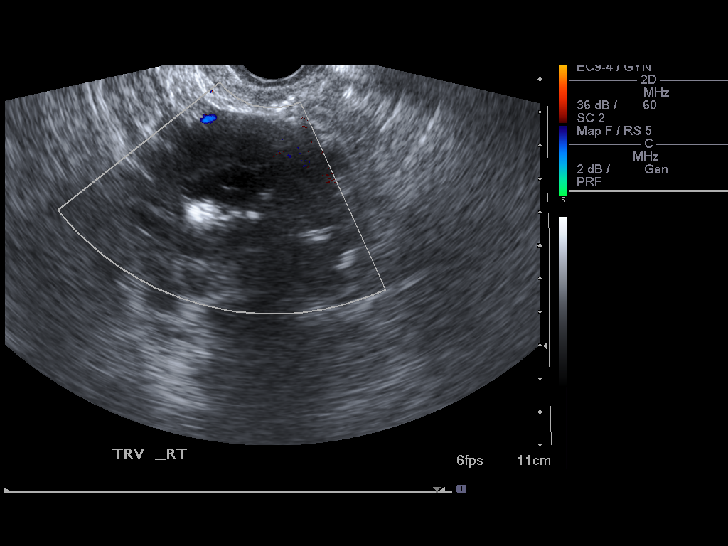
[im 31/37]
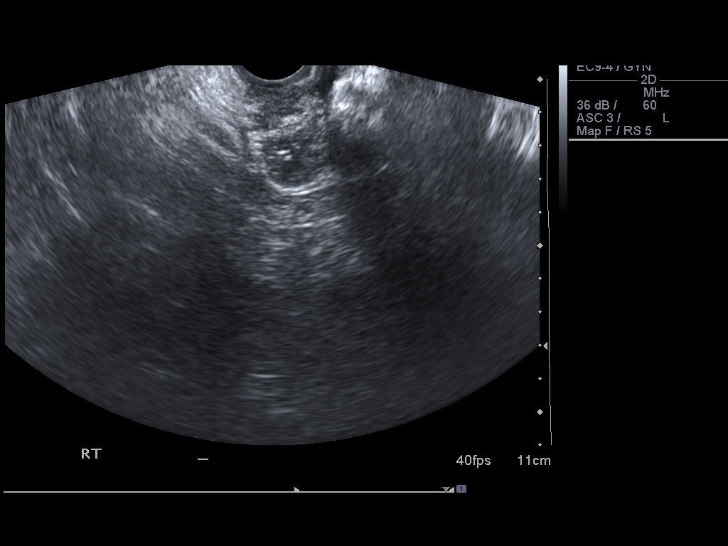
[im 34/37]
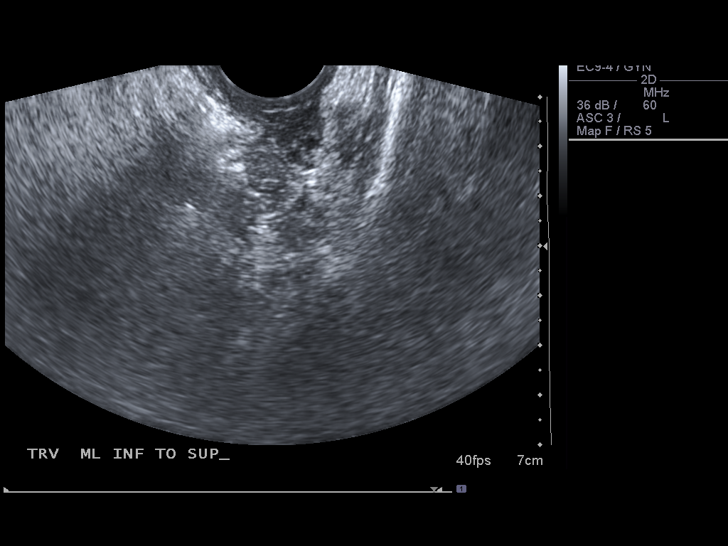
[im 37/37]
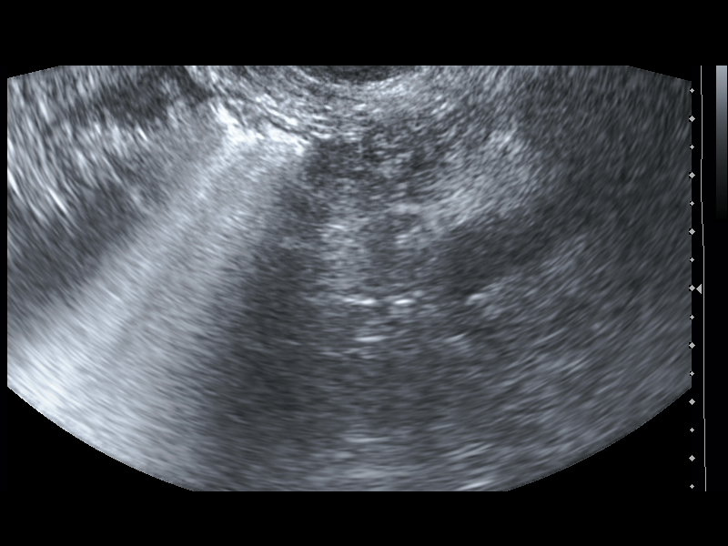

[13 of 25 positions shown; findings below may reference images not displayed]

FINDINGS: Uterus status post hysterectomy.

Endometrium status post hysterectomy.

Right Ovary not visualized on the ultrasound examination but normal
on the recent CT scan.

Left Ovary not visualized on the ultrasound examination but normal
on recent CT scan.

Other Findings:  Mass-like lesion noted  in the mid pelvis
measuring 5.3 x 4.9 x 4.4 cm.  Ultrasound appearance is
nonspecific.  This correlates with the lesion seen on CT scan. A
small bowel tumor is possible.
IMPRESSION: 1.  Status post hysterectomy.
2.  Ovaries are not visualized on the ultrasound examination but
appear normal on the CT scan.
3.  A mass-like lesion noted in the mid pelvis.  This may be a
small bowel tumor causing the small bowel obstruction.

## 2009-12-31 ENCOUNTER — Encounter: Payer: Self-pay | Admitting: Internal Medicine

## 2009-12-31 ENCOUNTER — Encounter (INDEPENDENT_AMBULATORY_CARE_PROVIDER_SITE_OTHER): Payer: Self-pay | Admitting: *Deleted

## 2009-12-31 ENCOUNTER — Encounter (INDEPENDENT_AMBULATORY_CARE_PROVIDER_SITE_OTHER): Payer: Self-pay | Admitting: Internal Medicine

## 2009-12-31 IMAGING — CT CT BIOPSY
1 of 7 series · 14 of 32 positions shown, 19 images · non-contrast
Comparison: none

Clinical Data/Indication: MESENTERIC MASS

CT-GUIDED BIOPSY OF A MESENTERIC MASS.  CORE BIOPSY.
Sedation: Versed three mg, Fentanyl 75 mcg.
Total Moderate Sedation Time: 25 minutes.
Procedure: The procedure, risks, benefits, and alternatives were
explained to the patient. Questions regarding the procedure were
encouraged and answered. The patient understands and consents to
the procedure.
The right abdomen was prepped with betadine in a sterile fashion,
and a sterile drape was applied covering the operative field. A
sterile gown and sterile gloves were used for the procedure.
Under CT guidance, a(n) 17 gauge guide needle was advanced into the
mesenteric mass. Subsequently four 18 gauge core biopsies were
obtained. The guide needle was removed. Final imaging was
performed.
Patient tolerated the procedure well without complication.  Vital
sign monitoring by nursing staff during the procedure will continue
as patient is in the special procedures unit for post procedure
observation.

[Series 2: rtn ap without · axial · non-contrast · 0.76mm/px · z∈[-189,-14]mm · 14 of 41 slices shown, 19 images]
[im 3/41  soft-tissue]
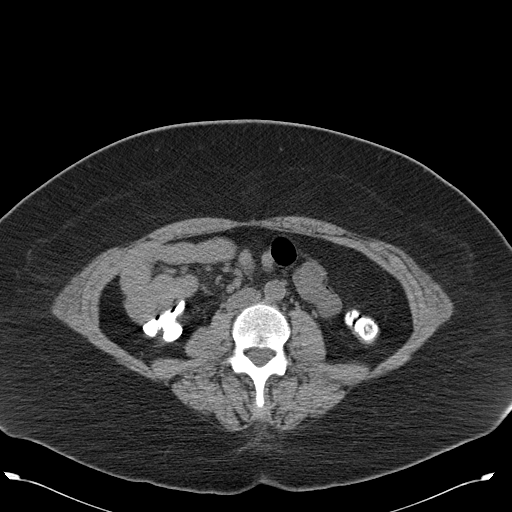
[im 3/41  bone]
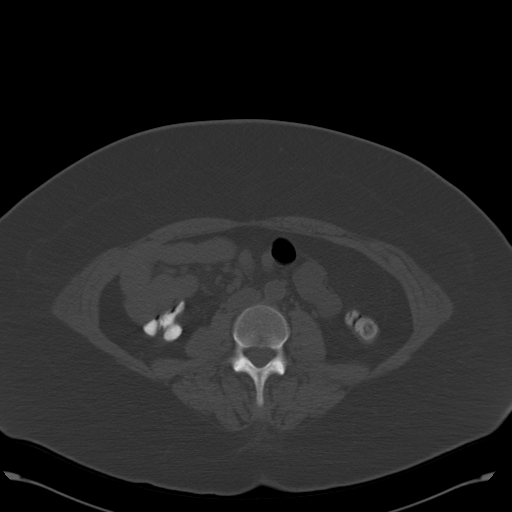
[im 5/41  soft-tissue]
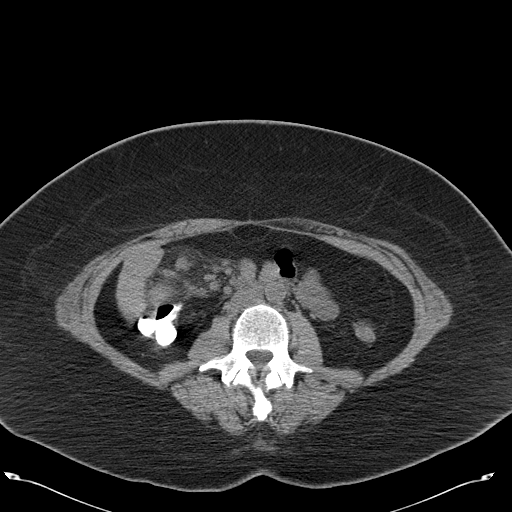
[im 10/41  soft-tissue]
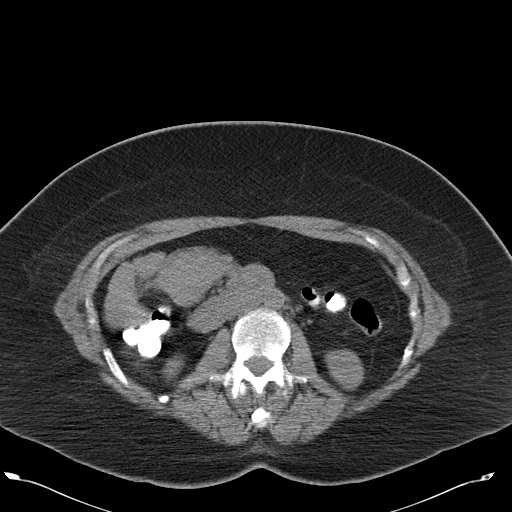
[im 12/41  soft-tissue]
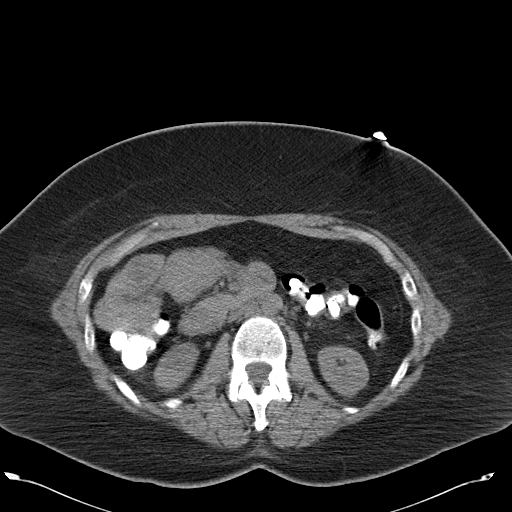
[im 15/41  soft-tissue]
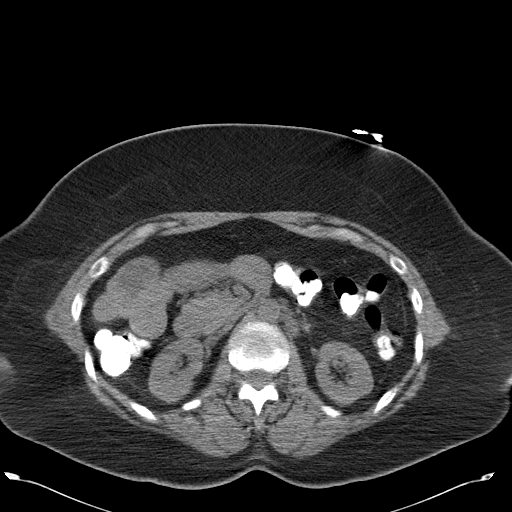
[im 17/41  soft-tissue]
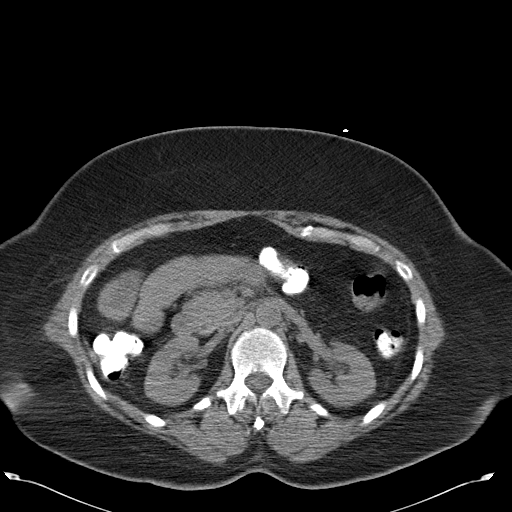
[im 22/41  soft-tissue]
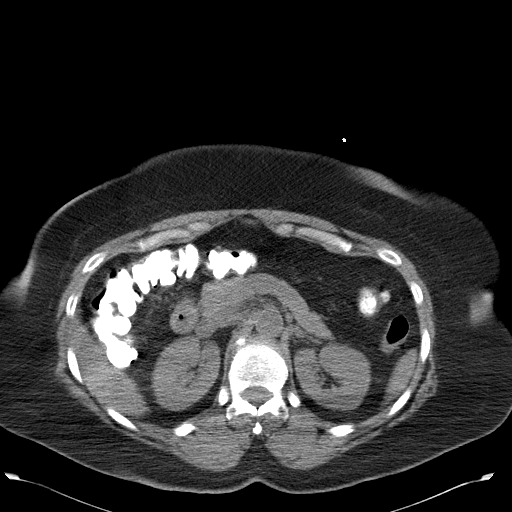
[im 24/41  soft-tissue]
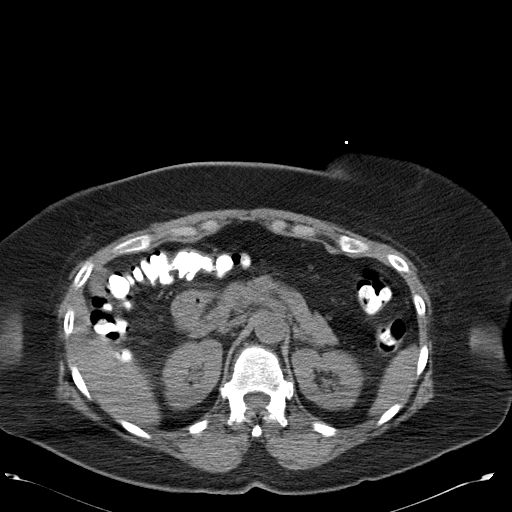
[im 26/41  soft-tissue]
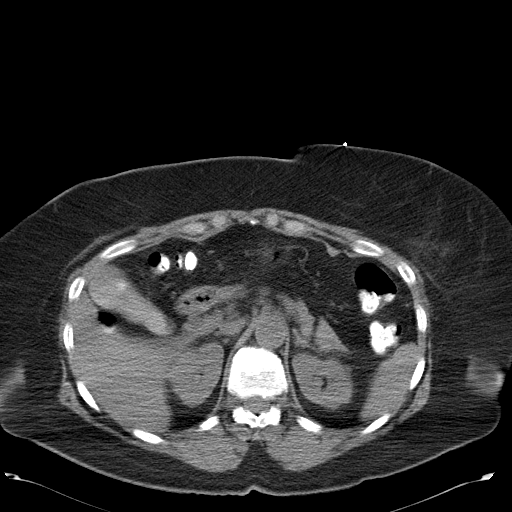
[im 26/41  bone]
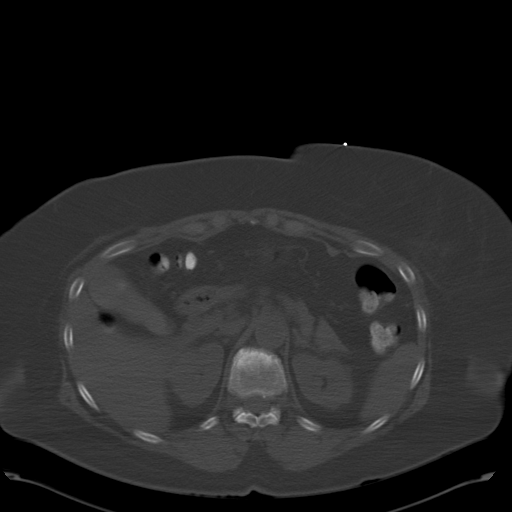
[im 29/41  soft-tissue]
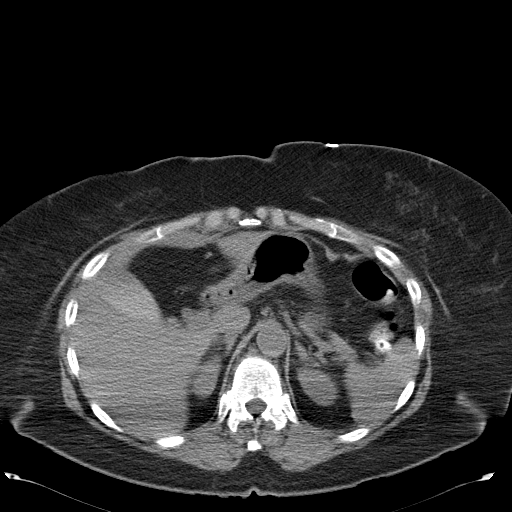
[im 31/41  soft-tissue]
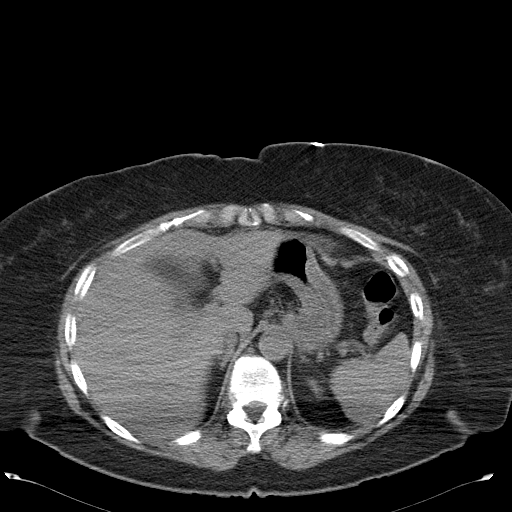
[im 31/41  lung]
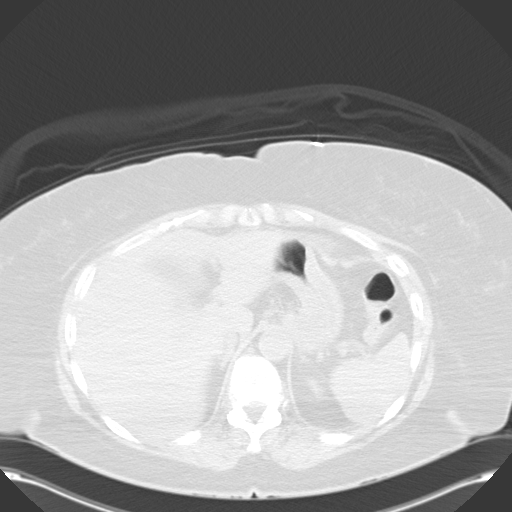
[im 33/41  lung]
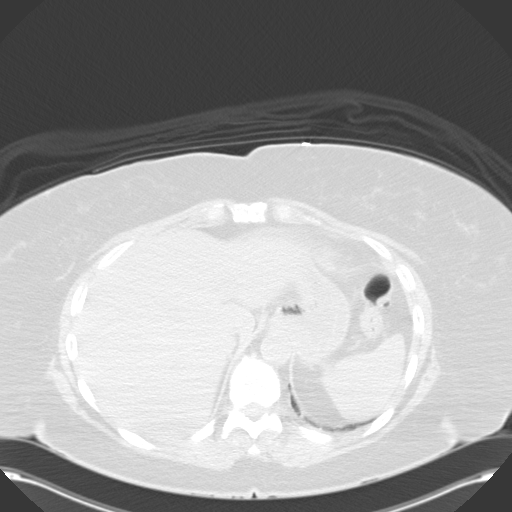
[im 36/41  soft-tissue]
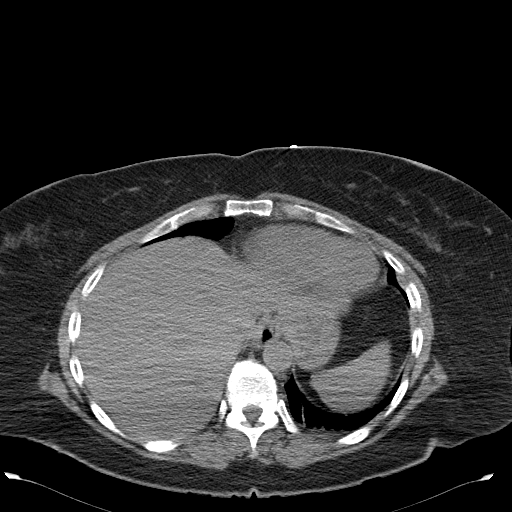
[im 36/41  lung]
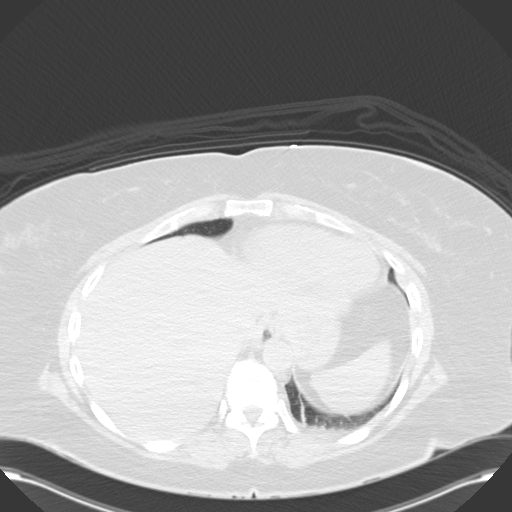
[im 38/41  soft-tissue]
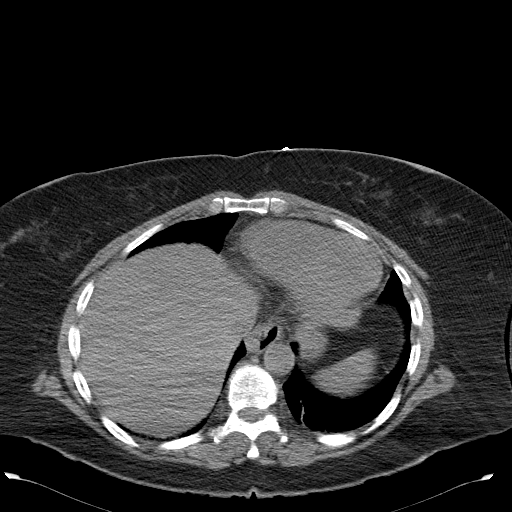
[im 38/41  lung]
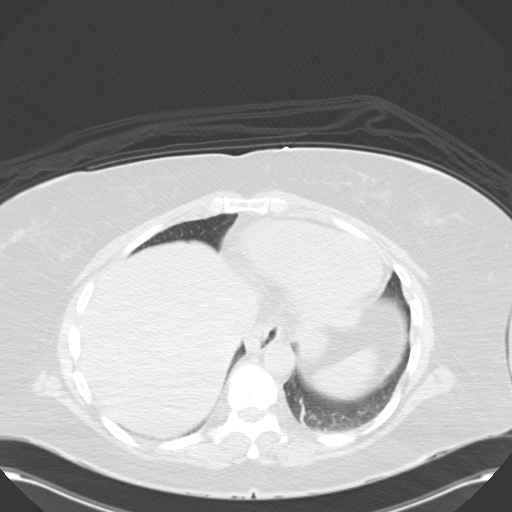

[14 of 32 positions shown; findings below may reference images not displayed]

FINDINGS: The images document guide needle placement within the
right abdominal mass.  Post biopsy images demonstrate no
hemorrhage.
IMPRESSION: Successful CT-guided core  biopsy of in mesenteric mass.

## 2010-01-03 ENCOUNTER — Telehealth: Payer: Self-pay | Admitting: Internal Medicine

## 2010-01-07 ENCOUNTER — Ambulatory Visit: Payer: Self-pay | Admitting: Internal Medicine

## 2010-01-07 DIAGNOSIS — C8593 Non-Hodgkin lymphoma, unspecified, intra-abdominal lymph nodes: Secondary | ICD-10-CM

## 2010-01-09 ENCOUNTER — Ambulatory Visit: Payer: Self-pay | Admitting: Oncology

## 2010-01-11 LAB — CBC WITH DIFFERENTIAL/PLATELET
BASO%: 0.3 % (ref 0.0–2.0)
Eosinophils Absolute: 0.1 10*3/uL (ref 0.0–0.5)
MCHC: 29.5 g/dL — ABNORMAL LOW (ref 31.5–36.0)
MONO#: 0.3 10*3/uL (ref 0.1–0.9)
NEUT#: 2 10*3/uL (ref 1.5–6.5)
Platelets: 355 10*3/uL (ref 145–400)
RBC: 4.87 10*6/uL (ref 3.70–5.45)
WBC: 3.9 10*3/uL (ref 3.9–10.3)
lymph#: 1.6 10*3/uL (ref 0.9–3.3)
nRBC: 0 % (ref 0–0)

## 2010-01-11 LAB — COMPREHENSIVE METABOLIC PANEL
ALT: 24 U/L (ref 0–35)
AST: 25 U/L (ref 0–37)
Albumin: 3.9 g/dL (ref 3.5–5.2)
CO2: 22 mEq/L (ref 19–32)
Calcium: 9.6 mg/dL (ref 8.4–10.5)
Chloride: 108 mEq/L (ref 96–112)
Creatinine, Ser: 0.74 mg/dL (ref 0.40–1.20)
Potassium: 3.9 mEq/L (ref 3.5–5.3)
Sodium: 144 mEq/L (ref 135–145)
Total Protein: 6.4 g/dL (ref 6.0–8.3)

## 2010-01-11 LAB — LACTATE DEHYDROGENASE: LDH: 213 U/L (ref 94–250)

## 2010-01-16 ENCOUNTER — Ambulatory Visit (HOSPITAL_COMMUNITY): Admission: RE | Admit: 2010-01-16 | Discharge: 2010-01-16 | Payer: Self-pay | Admitting: Oncology

## 2010-01-16 ENCOUNTER — Encounter (INDEPENDENT_AMBULATORY_CARE_PROVIDER_SITE_OTHER): Payer: Self-pay | Admitting: *Deleted

## 2010-01-16 IMAGING — CT NM PET TUM IMG INITIAL (PI) SKULL BASE T - THIGH
6 series · 25 of 25 positions shown · IV contrast (350 OM)
Comparison: CT [DATE]

CLINICAL DATA: Initial treatment strategy for lymphoma.

NUCLEAR MEDICINE PET SKULL BASE TO THIGH
Fasting Blood Glucose:  93
TECHNIQUE: 16.6 mCi F-18 FDG was injected intravenously via the
right antecubital fossa.  Full-ring PET imaging was performed from
the skull base through the mid-thighs 63  minutes after injection.
CT data was obtained and used for attenuation correction and
anatomic localization only.  (This was not acquired as a diagnostic
CT examination.)

[Series 1: pet ac · axial · 3.3mm · 4.69mm/px · z∈[-870,+0]mm · 5 of 267 slices shown]
[im 1/267]
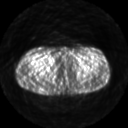
[im 67/267]
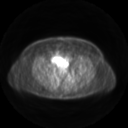
[im 134/267]
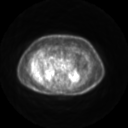
[im 200/267]
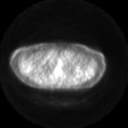
[im 267/267]
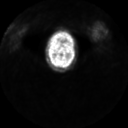

[Series 2: ct images · axial · 3.8mm · 0.98mm/px · z∈[-870,+0]mm · 5 of 267 slices shown]
[im 1/267]
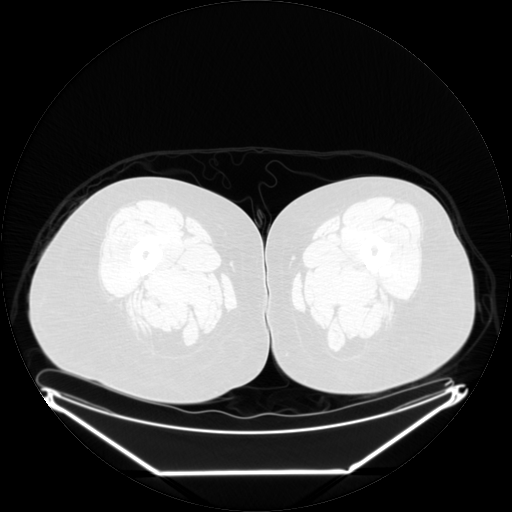
[im 67/267]
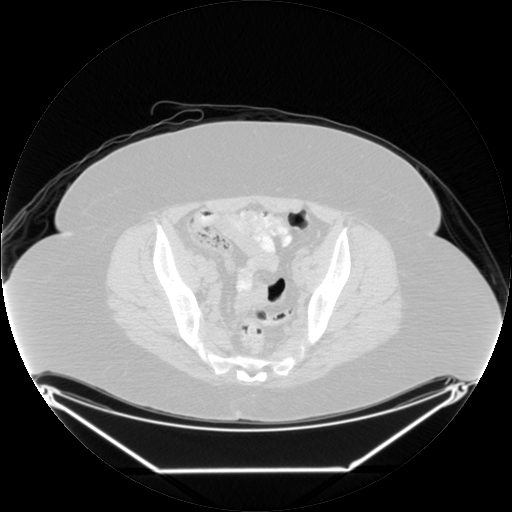
[im 134/267]
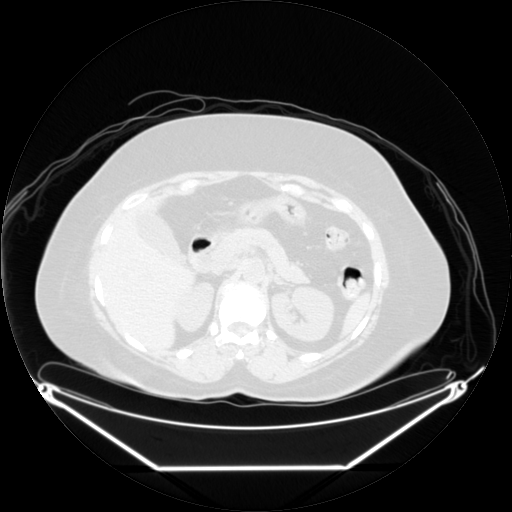
[im 200/267]
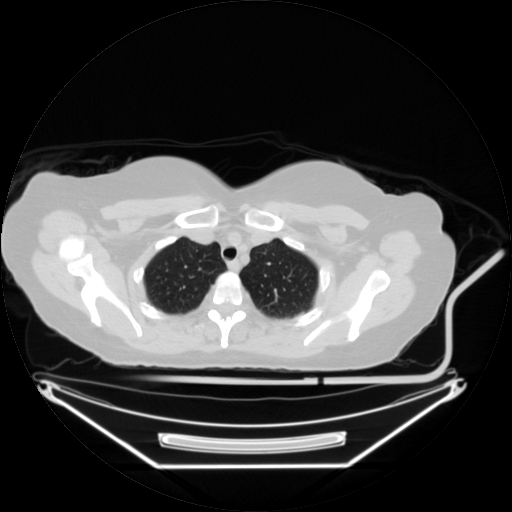
[im 267/267  brain]
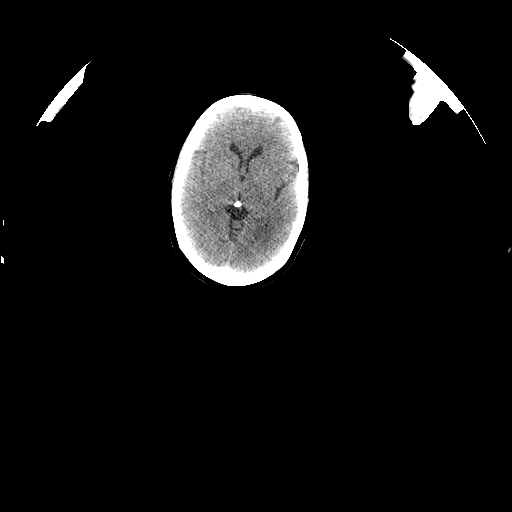

[Series 2: pet nac · axial · 3.3mm · 4.69mm/px · z∈[-870,+0]mm · 6 of 267 slices shown]
[im 1/267]
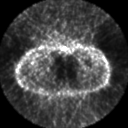
[im 54/267]
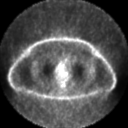
[im 107/267]
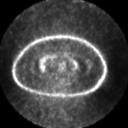
[im 160/267]
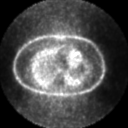
[im 213/267]
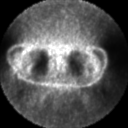
[im 267/267]
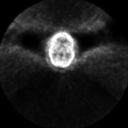

[Series 123: mip · coronal · 3.3mm · 4.69mm/px · 1 of 30 slices shown]
[im 1/30]
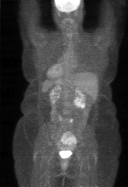

[Series 151: reformatted · axial · 3.3mm · 3.91mm/px · z∈[-870,+0]mm · 6 of 267 slices shown (1 of 2)]
[im 1/267]
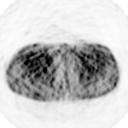
[im 54/267]
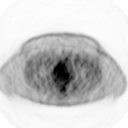
[im 107/267]
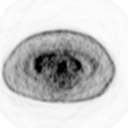
[im 160/267]
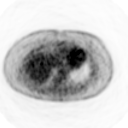
[im 213/267]
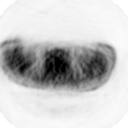
[im 267/267]
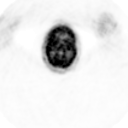

[Series 153: reformatted · coronal · 4.7mm · 6.98mm/px · 2 of 85 slices shown (2 of 2)]
[im 1/85]
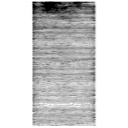
[im 85/85]
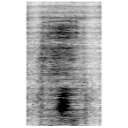

[25 of 25 positions shown; findings below may reference images not displayed]

FINDINGS: Neck:No hypermetabolic nodes in the neck.

Chest:No hypermetabolic mediastinal or hilar nodes.  No suspicious
pulmonary nodules.

Abdomen / Pelvis:There is hypermetabolic mass in the right upper
quadrant just beneath the duodenum which measures 4.5 x 3.6 cm
(image 49) with intense metabolic activity SUV max = 10.9.
Hypermetabolic smaller metabolic nodes in the central mesentery
(image 172) measuring 11 mm with SUV max = 8.0.  There is a
hypermetabolic circumferential mass surrounding a loop of small
bowel within the upper pelvis with SUV max = 9.8 (image 206).

There is no hypermetabolic activity within the spleen or liver to
suggest metastasis.

Skeleton:No focal hypermetabolic activity to suggest skeletal
metastasis.
IMPRESSION: 1.  Hypermetabolic mass within the right upper quadrant adjacent
to or potentially arising from the second portion the duodenum
consistent with lymphoma.
2.  Hypermetabolic circumferential mass-like thickening of the
distal small bowel in the pelvis consistent with lymphoma.
3.  Several hypermetabolic retroperitoneal/mesenteric lymph nodes.

4.  No evidence of metastasis above the diaphragm or within the
liver or spleen.  No evidence of bone metastasis.

## 2010-01-21 ENCOUNTER — Ambulatory Visit (HOSPITAL_COMMUNITY): Admission: RE | Admit: 2010-01-21 | Discharge: 2010-01-21 | Payer: Self-pay | Admitting: Oncology

## 2010-01-21 IMAGING — XA IR FLUORO GUIDE CV LINE*R*
1 series · 1 of 1 positions shown · non-contrast
Comparison: none

CLINICAL DATA: Non-Hodgkins lymphoma, access for chemotherapy

[Series 300: line placements · 1 of 1 slices shown]
[im 1/1]
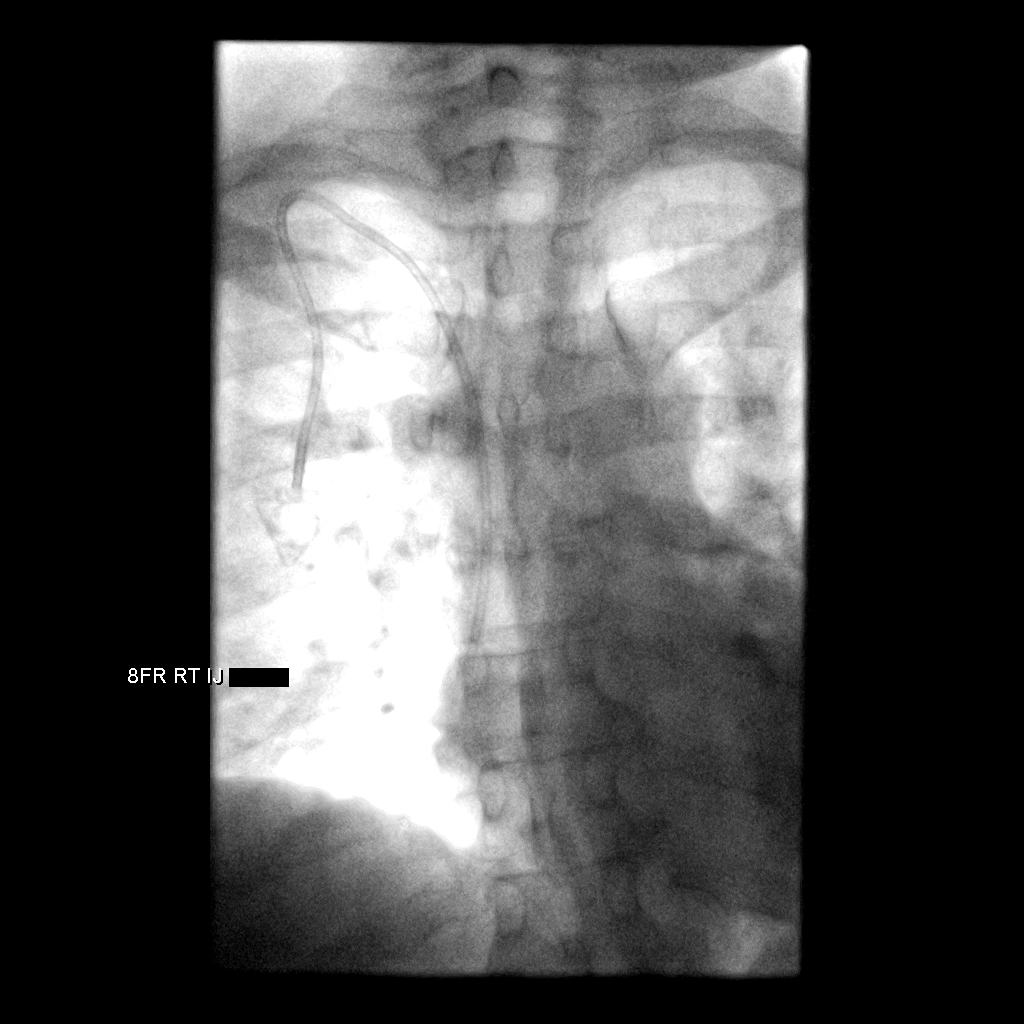

[1 of 1 positions shown; findings below may reference images not displayed]

ULTRASOUND GUIDANCE FOR VASCULAR ACCESS
RIGHT INTERNAL JUGULAR SINGLE LUMEN POWER PORT CATHETER INSERTION

Date: [DATE] [DATE],[DATE] [DATE]

Radiologist:  NYA, M.D.

Medications:  1 gram ancefadministered within 1 hour of the
procedure.2 mg Versed, 100 mcg Fentanyl

Guidance:  Ultrasound and fluoroscopic

Fluoroscopy time:  0.6 minutes

Sedation time:  40 minutes

Contrast volume:  None.

Complications:  No immediate

PROCEDURE/FINDINGS:

Informed consent was obtained from the patient following
explanation of the procedure, risks, benefits and alternatives.
The patient understands, agrees and consents for the procedure.
All questions were addressed.  A time out was performed.

Maximal barrier sterile technique utilized including caps, mask,
sterile gowns, sterile gloves, large sterile drape, hand hygiene,
and 2% chlorhexidine scrub.

Under sterile conditions and local anesthesia, right internal
jugular micropuncture venous access was performed.  Access was
performed with ultrasound.  Images were obtained for documentation.
A guide wire was inserted followed by a transitional dilator.  This
allowed insertion of a guide wire and catheter into the IVC.
Measurements were obtained from the SVC / RA junction back to the
right IJ venotomy site.  In the right infraclavicular chest, a
subcutaneous pocket was created over the second anterior rib.  This
was done under sterile conditions and local anesthesia.  1%
lidocaine with epinephrine was utilized for this.  A 2.5 cm
incision was made in the skin.  Blunt dissection was performed to
create a subcutaneous pocket over the right pectoralis major
muscle.  The pocket was flushed with saline vigorously.  There was
adequate hemostasis.  The port catheter was assembled and checked
for leakage.  The port catheter was secured in the pocket with two
retention sutures.  The tubing was tunneled subcutaneously to the
right venotomy site and inserted into the SVC/RA junction through a
valved peel-away sheath.  Position was confirmed with fluoroscopy.
Images were obtained for documentation.  The patient tolerated the
procedure well.  No immediate complications.  Incisions were closed
in a two layer fashion with 4 - 0 Vicryl suture.  Dermabond was
applied to the skin. The port catheter was accessed, blood was
aspirated followed by saline and heparin flushes.  Needle was
removed.  A dry sterile dressing was applied.
IMPRESSION: Ultrasound and fluoroscopically guided right internal jugular
single lumen power port catheter insertion.  Tip in the SVC/RA
junction.  Catheter ready for use.

## 2010-01-21 IMAGING — US IR US GUIDE VASC ACCESS RIGHT
1 series · 2 of 2 positions shown · non-contrast
Comparison: none

CLINICAL DATA: Non-Hodgkins lymphoma, access for chemotherapy

[Series 1: sp us guide vasc access*right* · 2 of 2 slices shown]
[im 1/2]
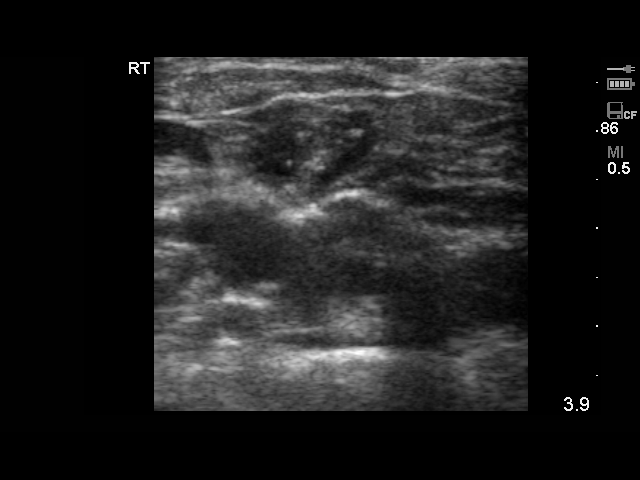
[im 2/2]
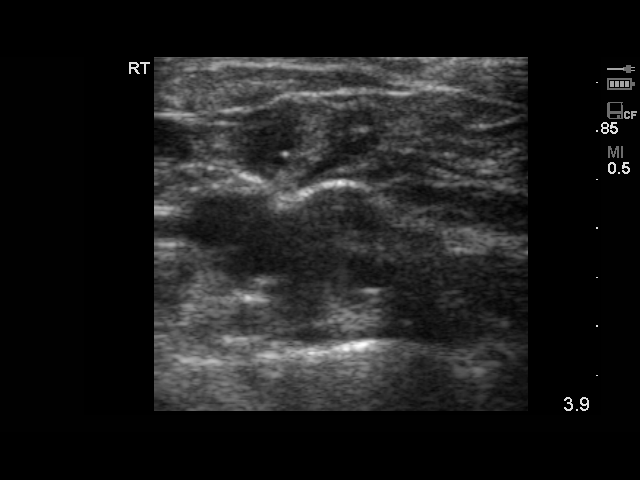

[2 of 2 positions shown; findings below may reference images not displayed]

ULTRASOUND GUIDANCE FOR VASCULAR ACCESS
RIGHT INTERNAL JUGULAR SINGLE LUMEN POWER PORT CATHETER INSERTION

Date: [DATE] [DATE],[DATE] [DATE]

Radiologist:  NYA, M.D.

Medications:  1 gram ancefadministered within 1 hour of the
procedure.2 mg Versed, 100 mcg Fentanyl

Guidance:  Ultrasound and fluoroscopic

Fluoroscopy time:  0.6 minutes

Sedation time:  40 minutes

Contrast volume:  None.

Complications:  No immediate

PROCEDURE/FINDINGS:

Informed consent was obtained from the patient following
explanation of the procedure, risks, benefits and alternatives.
The patient understands, agrees and consents for the procedure.
All questions were addressed.  A time out was performed.

Maximal barrier sterile technique utilized including caps, mask,
sterile gowns, sterile gloves, large sterile drape, hand hygiene,
and 2% chlorhexidine scrub.

Under sterile conditions and local anesthesia, right internal
jugular micropuncture venous access was performed.  Access was
performed with ultrasound.  Images were obtained for documentation.
A guide wire was inserted followed by a transitional dilator.  This
allowed insertion of a guide wire and catheter into the IVC.
Measurements were obtained from the SVC / RA junction back to the
right IJ venotomy site.  In the right infraclavicular chest, a
subcutaneous pocket was created over the second anterior rib.  This
was done under sterile conditions and local anesthesia.  1%
lidocaine with epinephrine was utilized for this.  A 2.5 cm
incision was made in the skin.  Blunt dissection was performed to
create a subcutaneous pocket over the right pectoralis major
muscle.  The pocket was flushed with saline vigorously.  There was
adequate hemostasis.  The port catheter was assembled and checked
for leakage.  The port catheter was secured in the pocket with two
retention sutures.  The tubing was tunneled subcutaneously to the
right venotomy site and inserted into the SVC/RA junction through a
valved peel-away sheath.  Position was confirmed with fluoroscopy.
Images were obtained for documentation.  The patient tolerated the
procedure well.  No immediate complications.  Incisions were closed
in a two layer fashion with 4 - 0 Vicryl suture.  Dermabond was
applied to the skin. The port catheter was accessed, blood was
aspirated followed by saline and heparin flushes.  Needle was
removed.  A dry sterile dressing was applied.
IMPRESSION: Ultrasound and fluoroscopically guided right internal jugular
single lumen power port catheter insertion.  Tip in the SVC/RA
junction.  Catheter ready for use.

## 2010-01-22 ENCOUNTER — Encounter: Payer: Self-pay | Admitting: Internal Medicine

## 2010-01-23 LAB — WHOLE BLOOD GLUCOSE

## 2010-01-26 ENCOUNTER — Encounter (INDEPENDENT_AMBULATORY_CARE_PROVIDER_SITE_OTHER): Payer: Self-pay | Admitting: *Deleted

## 2010-01-26 ENCOUNTER — Observation Stay (HOSPITAL_COMMUNITY): Admission: EM | Admit: 2010-01-26 | Discharge: 2010-01-29 | Payer: Self-pay | Admitting: Emergency Medicine

## 2010-01-26 IMAGING — CR DG ABDOMEN ACUTE W/ 1V CHEST
3 series · 3 of 3 positions shown · non-contrast
Comparison: None.

CLINICAL DATA: Nominal pain, nausea and vomiting

ACUTE ABDOMEN SERIES (ABDOMEN 2 VIEW & CHEST 1 VIEW)

[w chest pa]
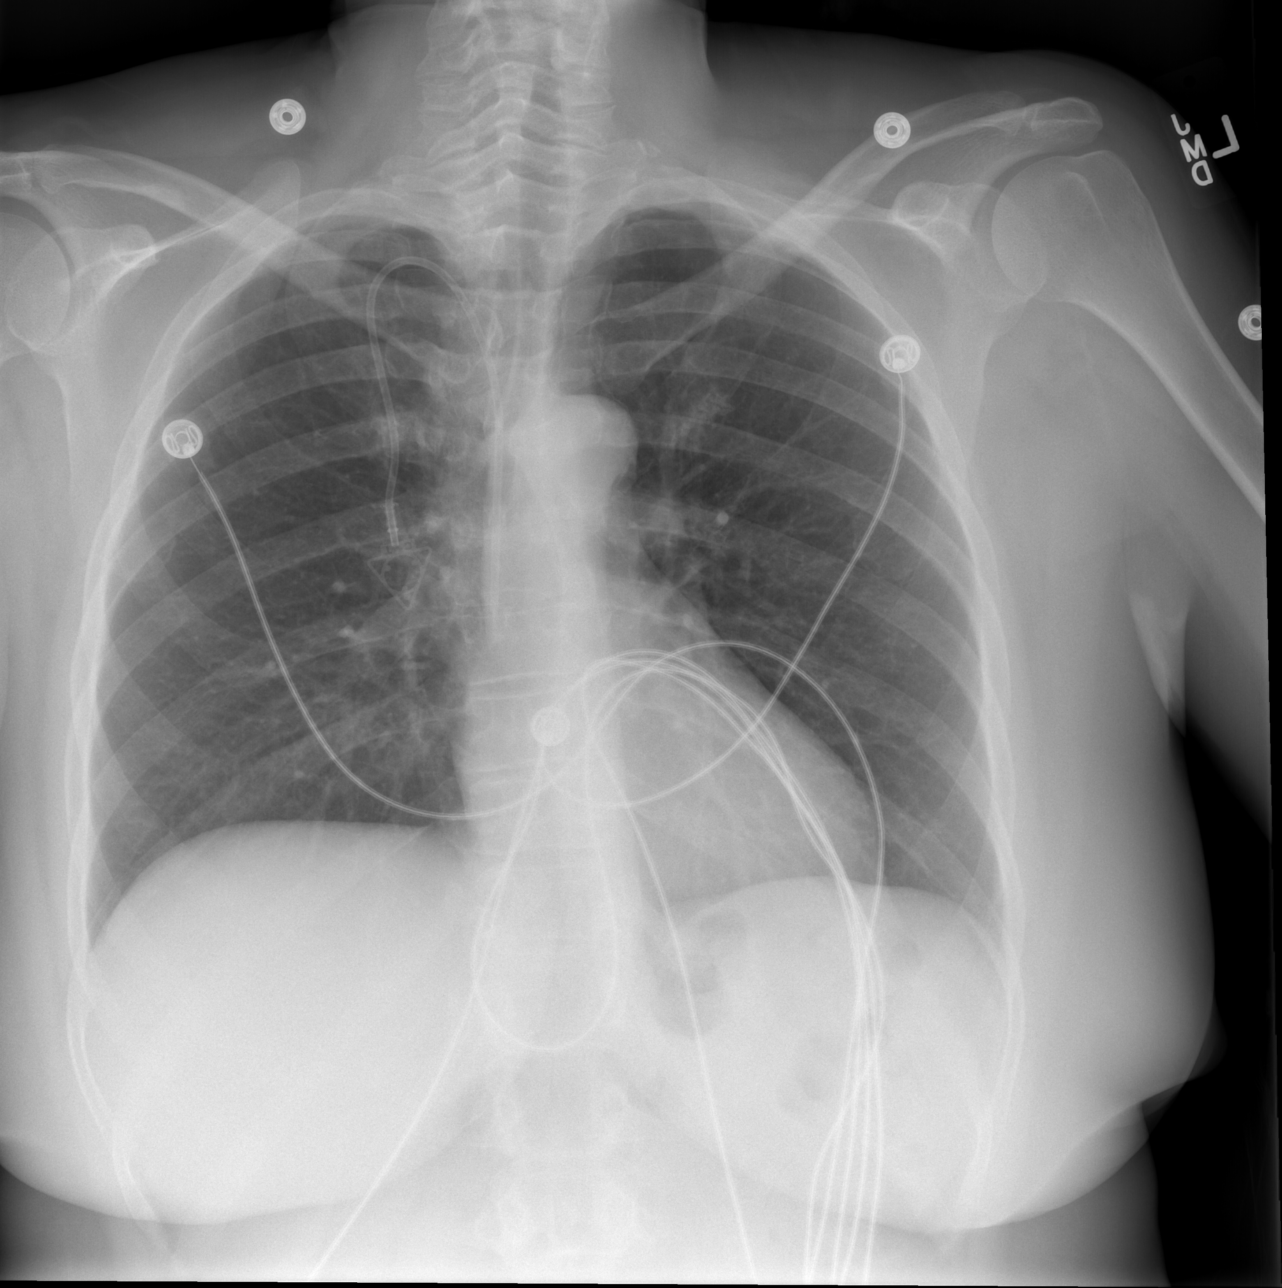

[w abdomen upright *]
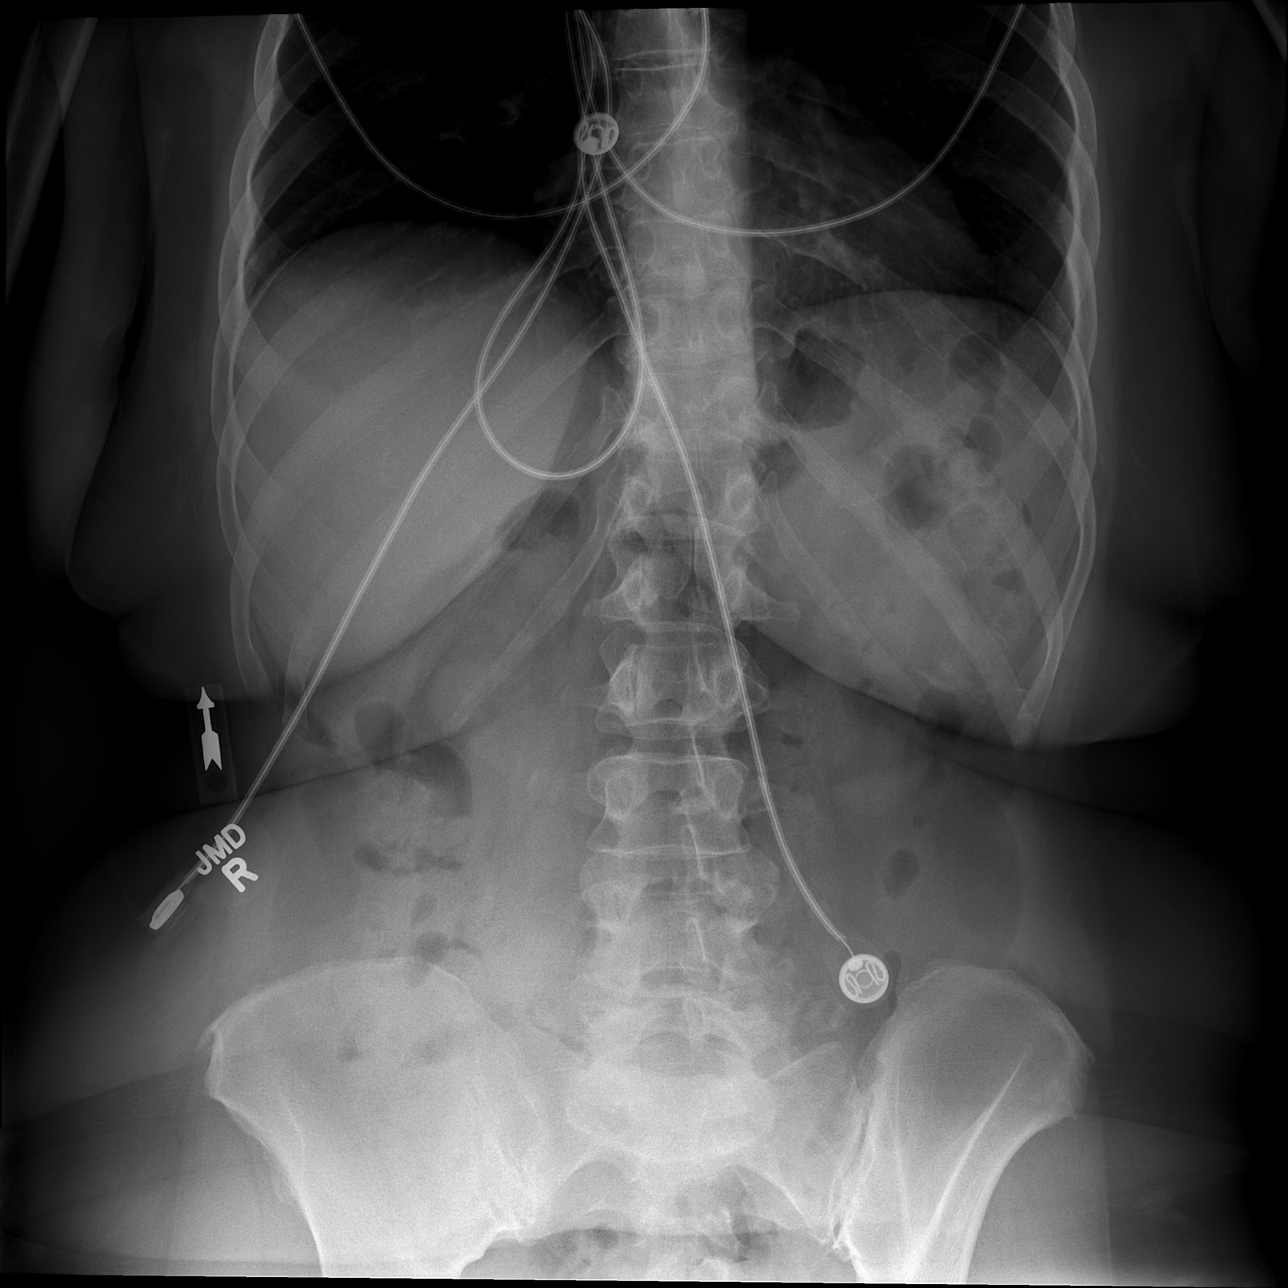

[t abdomen supine]
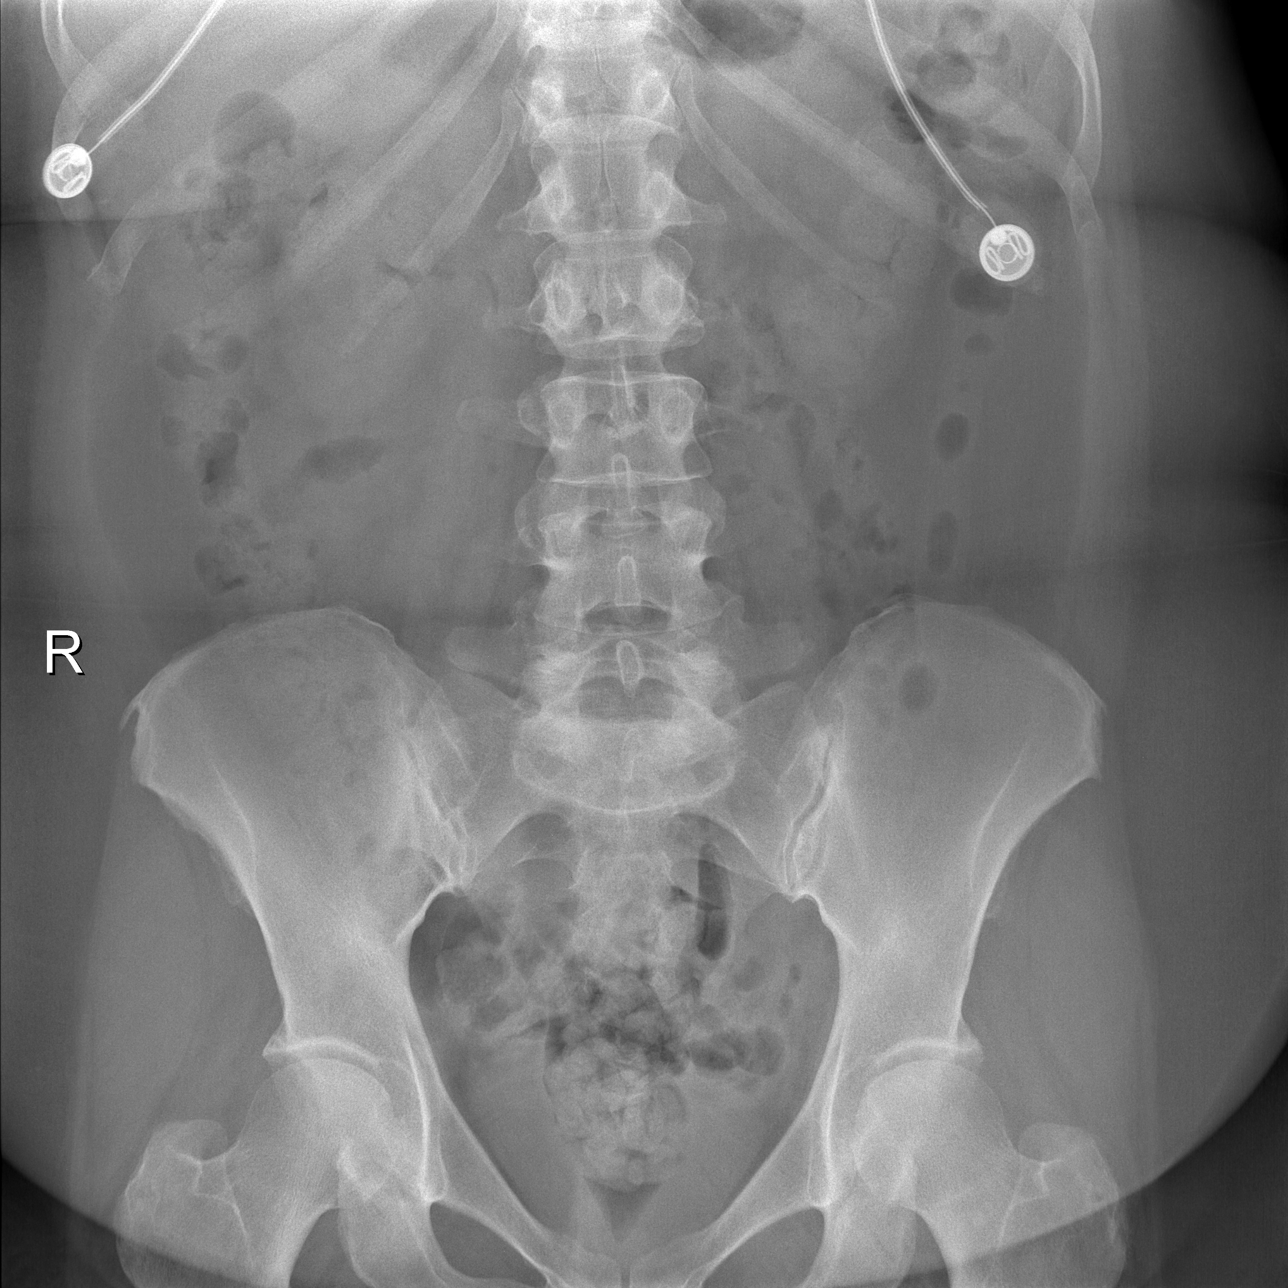

[3 of 3 positions shown; findings below may reference images not displayed]

FINDINGS: Cardiomediastinal silhouette is unremarkable.  There is a
right IJ Port-A-Cath with tip in SVC.  No diagnostic pneumothorax.
No acute infiltrate or edema.

There is a nonspecific nonobstructive bowel gas pattern.  Bony
structures are unremarkable.  No free abdominal air.
IMPRESSION: No acute disease.  Nonspecific nonobstructive bowel gas pattern.
No free abdominal air.

## 2010-01-27 ENCOUNTER — Ambulatory Visit: Payer: Self-pay | Admitting: Oncology

## 2010-02-15 ENCOUNTER — Ambulatory Visit: Payer: Self-pay | Admitting: Oncology

## 2010-02-19 ENCOUNTER — Encounter: Payer: Self-pay | Admitting: Internal Medicine

## 2010-02-19 LAB — COMPREHENSIVE METABOLIC PANEL
ALT: 21 U/L (ref 0–35)
CO2: 27 mEq/L (ref 19–32)
Calcium: 9.1 mg/dL (ref 8.4–10.5)
Chloride: 107 mEq/L (ref 96–112)
Creatinine, Ser: 0.77 mg/dL (ref 0.40–1.20)
Glucose, Bld: 81 mg/dL (ref 70–99)
Sodium: 144 mEq/L (ref 135–145)
Total Bilirubin: 0.6 mg/dL (ref 0.3–1.2)
Total Protein: 6 g/dL (ref 6.0–8.3)

## 2010-02-19 LAB — CBC WITH DIFFERENTIAL/PLATELET
BASO%: 0.3 % (ref 0.0–2.0)
Eosinophils Absolute: 0.1 10*3/uL (ref 0.0–0.5)
HCT: 36.2 % (ref 34.8–46.6)
LYMPH%: 23.4 % (ref 14.0–49.7)
MONO#: 0.4 10*3/uL (ref 0.1–0.9)
NEUT#: 1.4 10*3/uL — ABNORMAL LOW (ref 1.5–6.5)
NEUT%: 56 % (ref 38.4–76.8)
Platelets: 203 10*3/uL (ref 145–400)
WBC: 2.6 10*3/uL — ABNORMAL LOW (ref 3.9–10.3)
lymph#: 0.6 10*3/uL — ABNORMAL LOW (ref 0.9–3.3)

## 2010-03-02 ENCOUNTER — Inpatient Hospital Stay (HOSPITAL_COMMUNITY): Admission: EM | Admit: 2010-03-02 | Discharge: 2010-03-05 | Payer: Self-pay | Admitting: Emergency Medicine

## 2010-03-02 IMAGING — CR DG ABDOMEN ACUTE W/ 1V CHEST
4 series · 4 of 4 positions shown · non-contrast
Comparison: [DATE]

CLINICAL DATA: Abdominal pain with nausea, vomiting, and diarrhea.

ACUTE ABDOMEN SERIES (ABDOMEN 2 VIEW & CHEST 1 VIEW)

[w abdomen upright *]
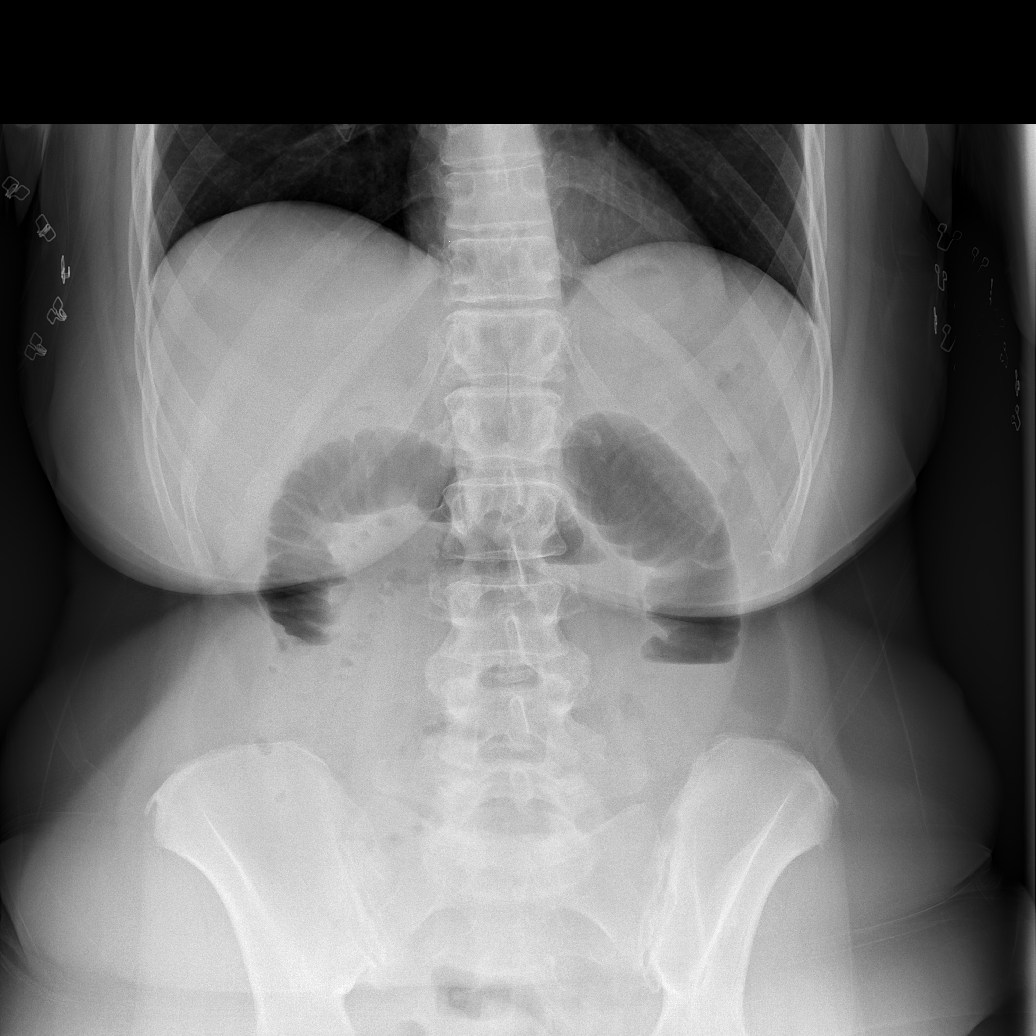

[w chest pa]
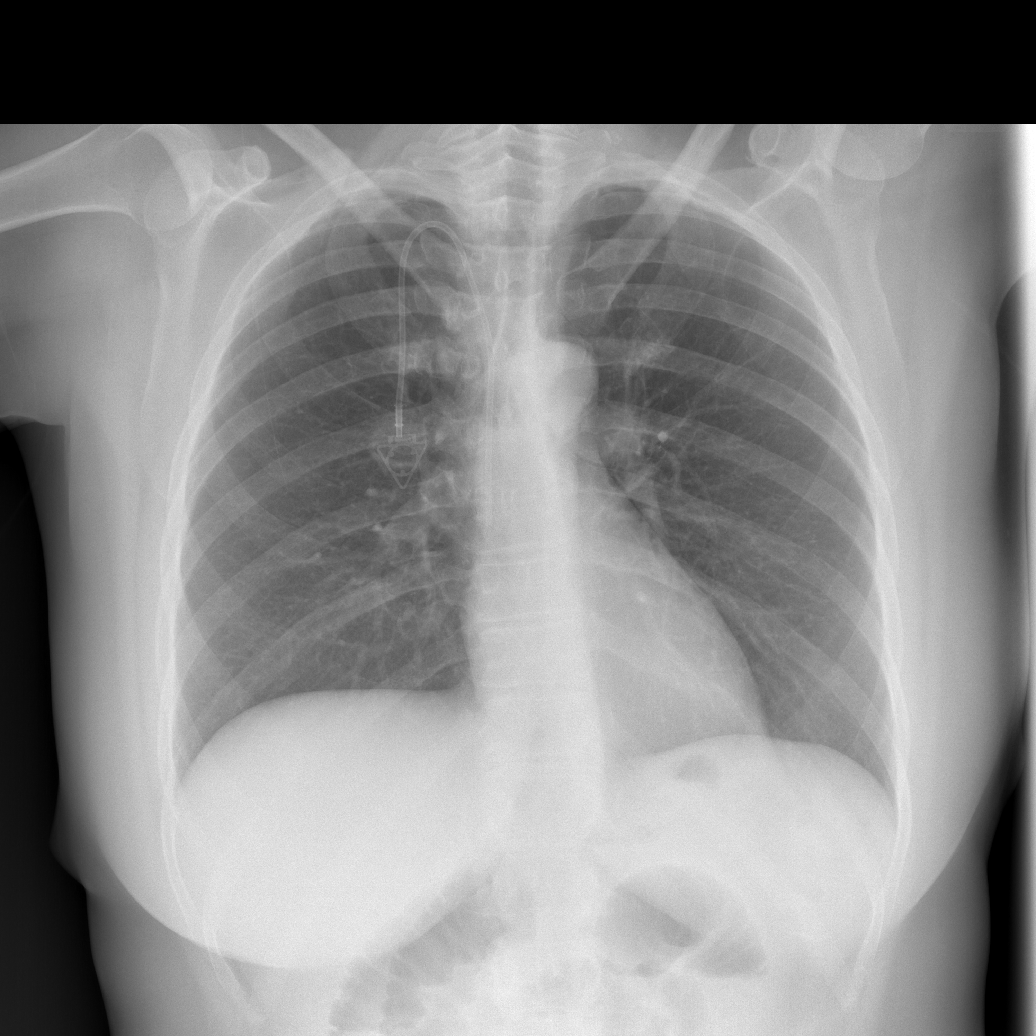

[t abdomen supine (1 of 2)]
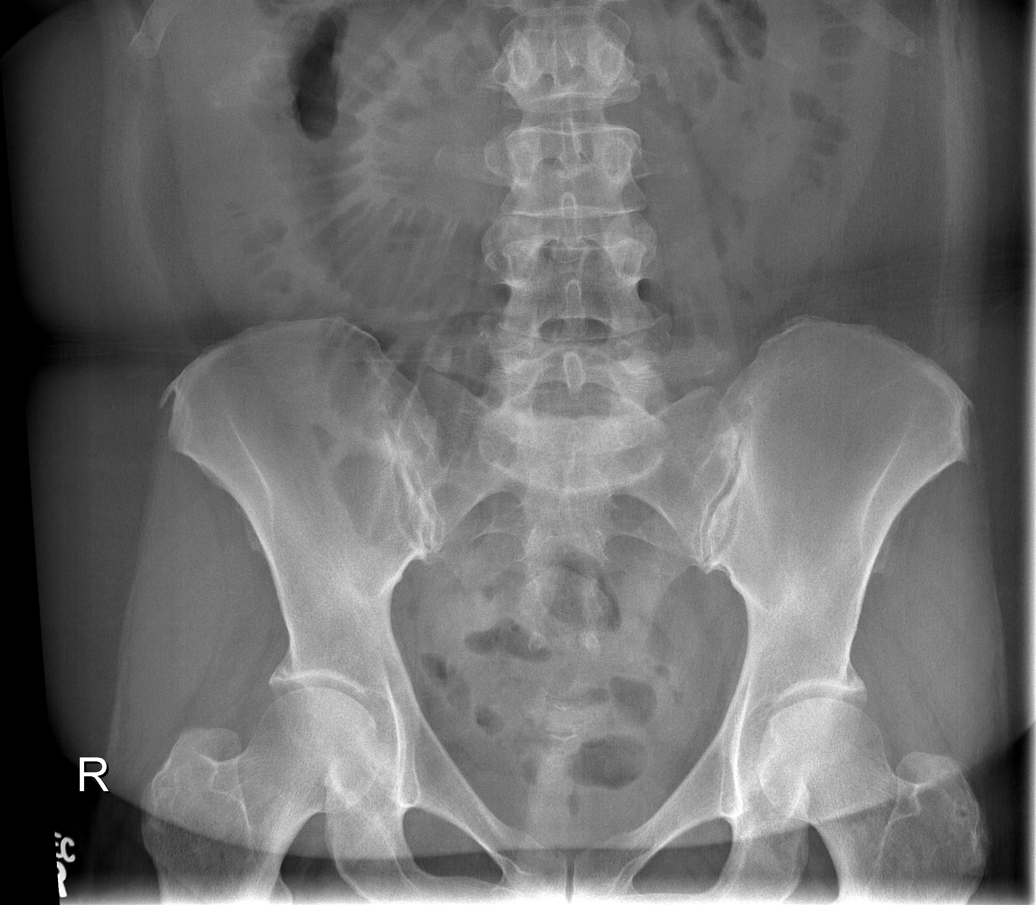

[t abdomen supine (2 of 2)]
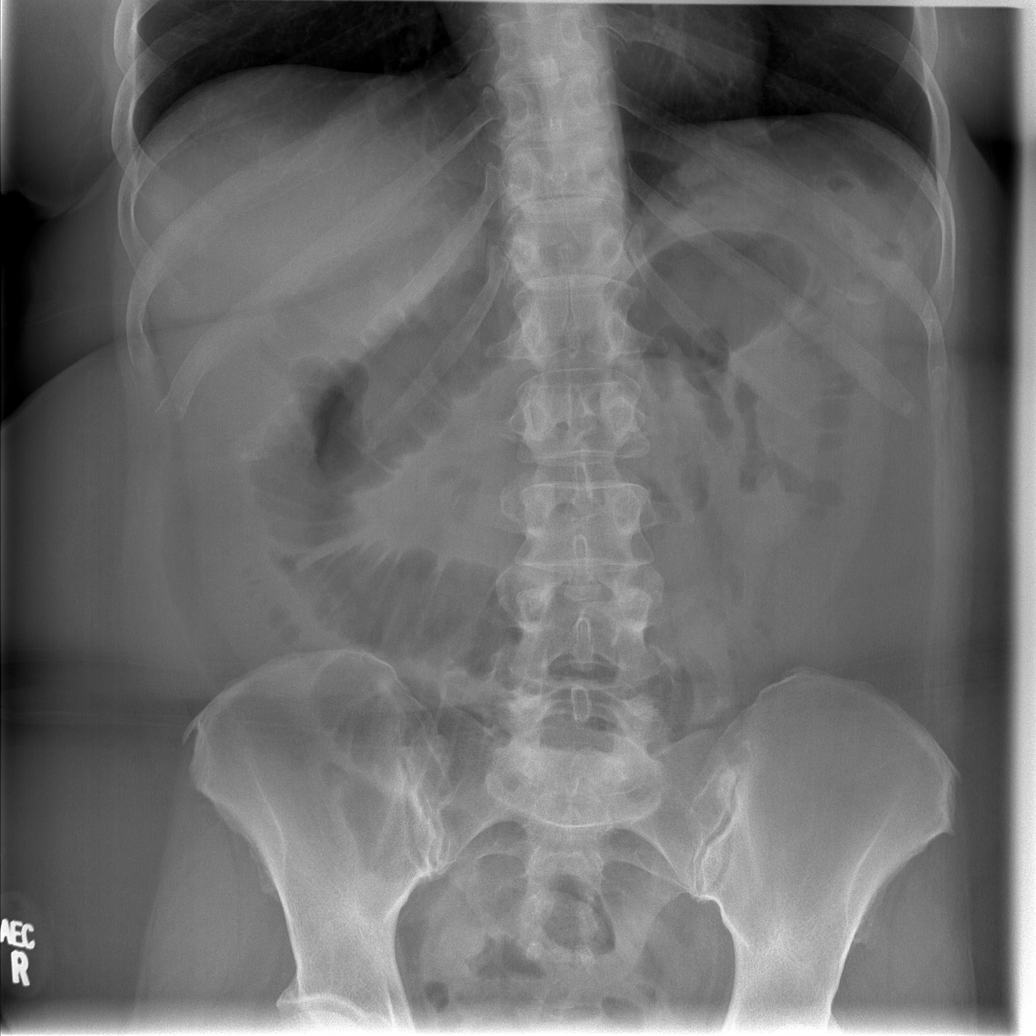

[4 of 4 positions shown; findings below may reference images not displayed]

FINDINGS: Port-A-Cath is in place, unchanged.  Heart and pulmonary
vascularity are normal and the lungs are clear.

There are several dilated loops of small bowel with stair step air
fluid levels consistent with small bowel obstruction.  The colon is
not distended.  There is no free air or free fluid in the abdomen.
Osseous structures are normal.
IMPRESSION: Small bowel obstruction.

## 2010-03-03 IMAGING — CR DG ABDOMEN ACUTE W/ 1V CHEST
3 series · 3 of 3 positions shown · non-contrast
Comparison: [DATE]

CLINICAL DATA: Small bowel obstruction

ACUTE ABDOMEN SERIES (ABDOMEN 2 VIEW & CHEST 1 VIEW)

[w chest pa]
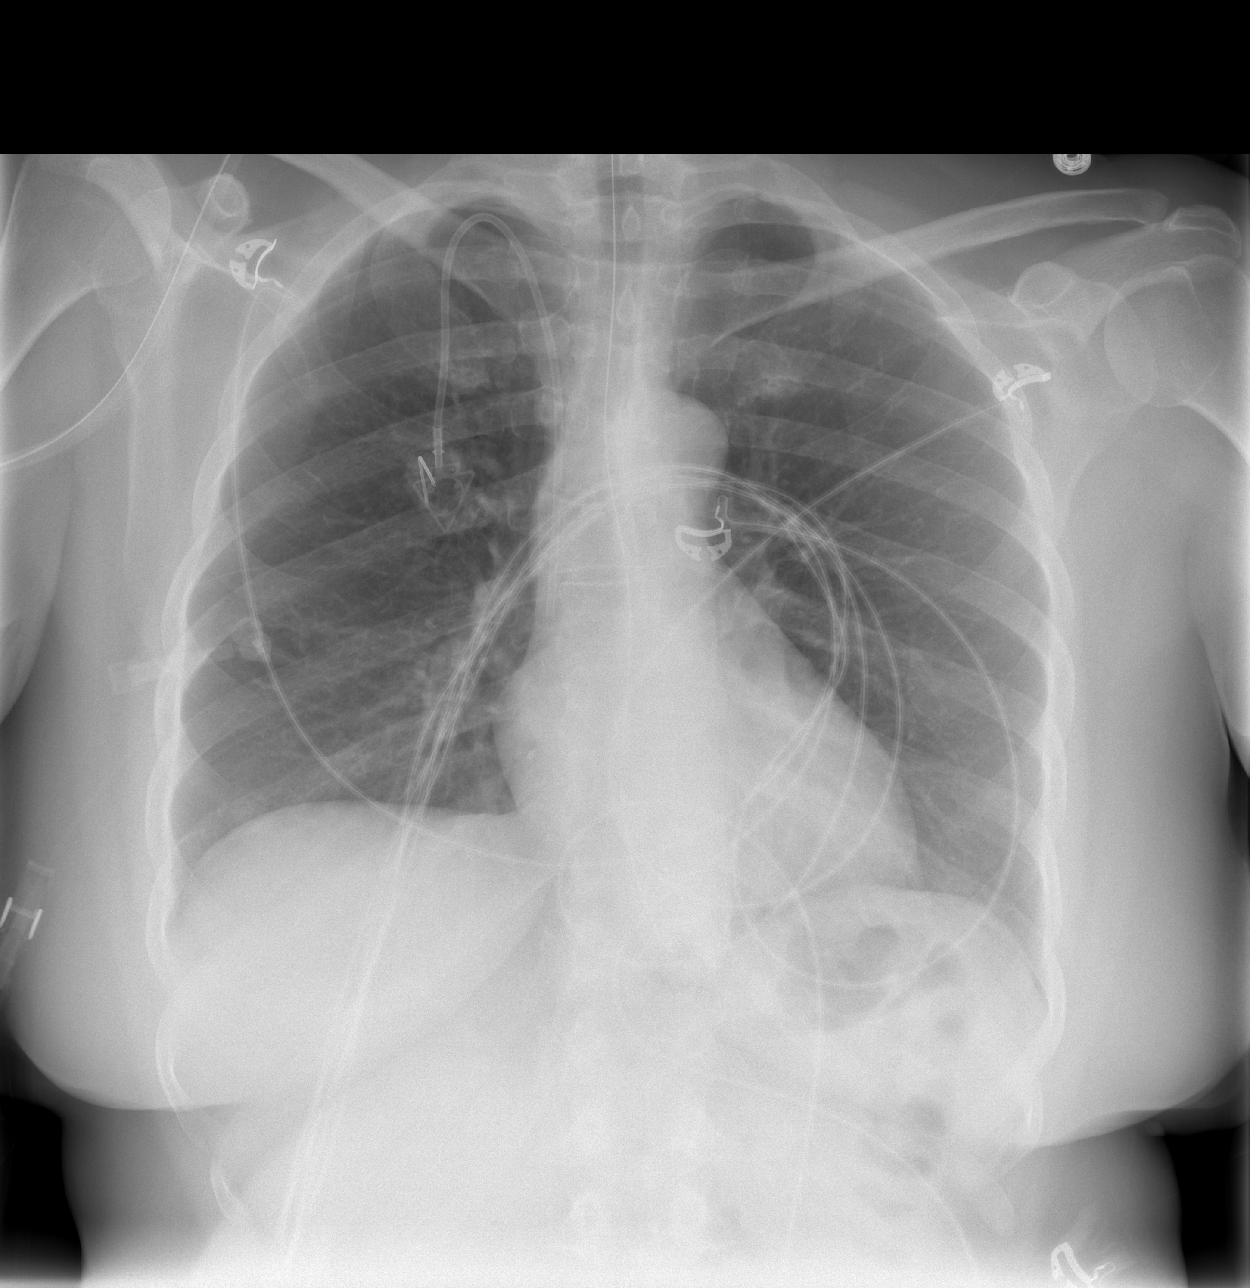

[w abdomen upright *]
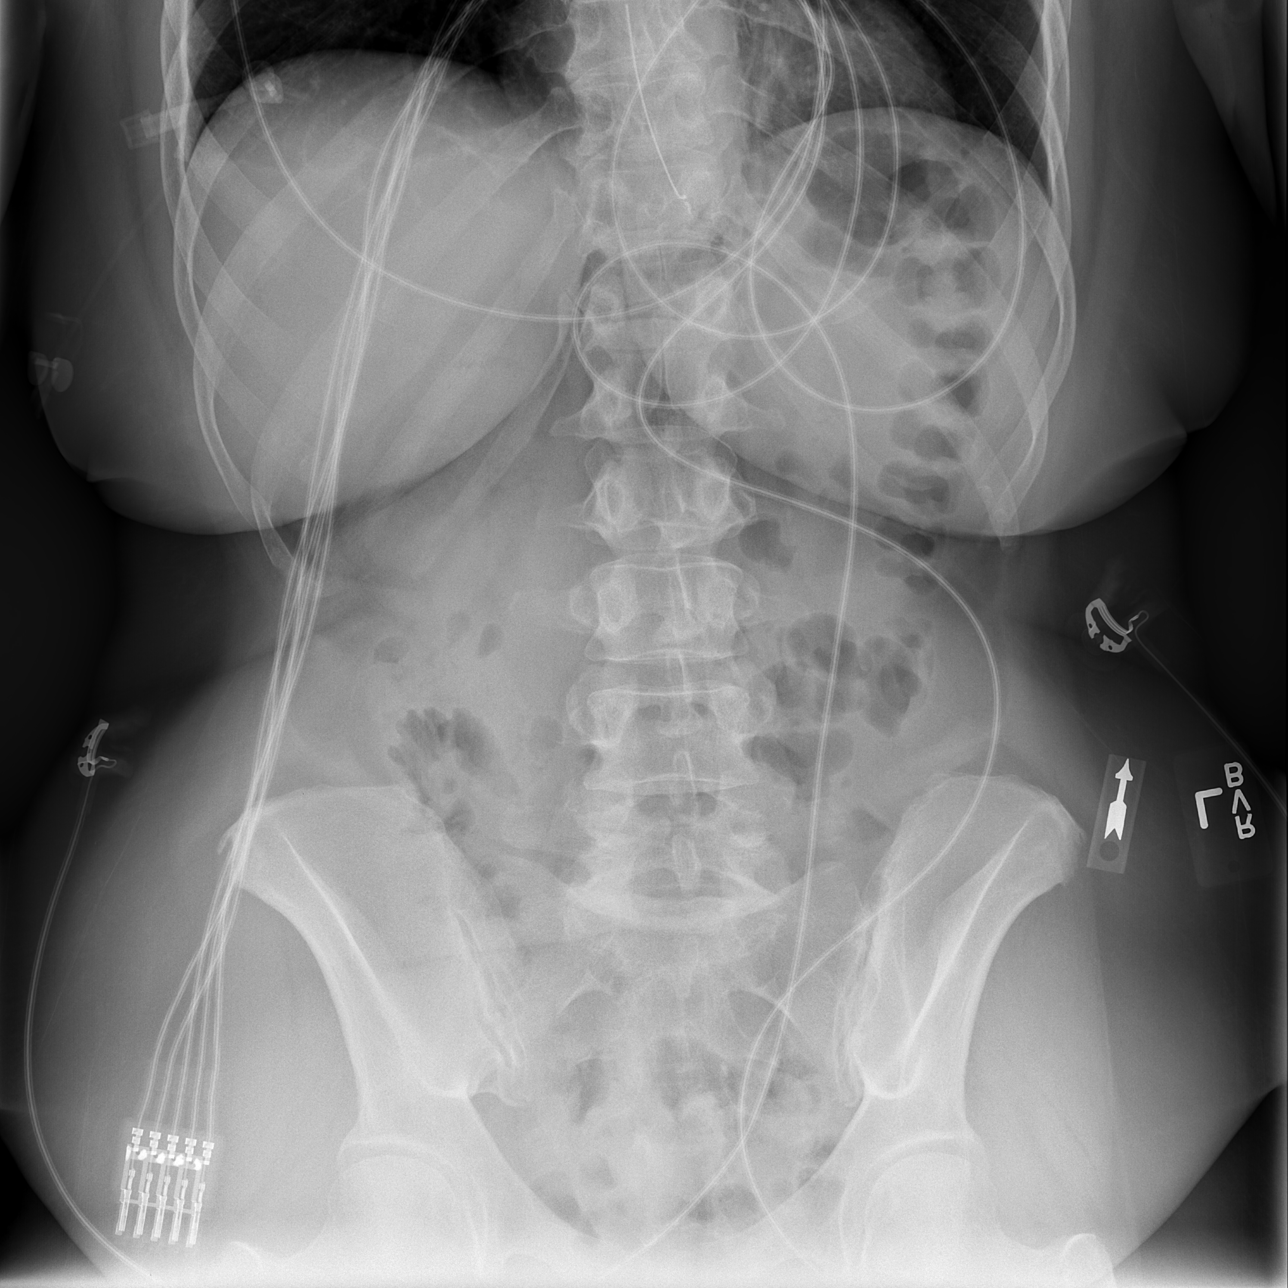

[t abdomen supine]
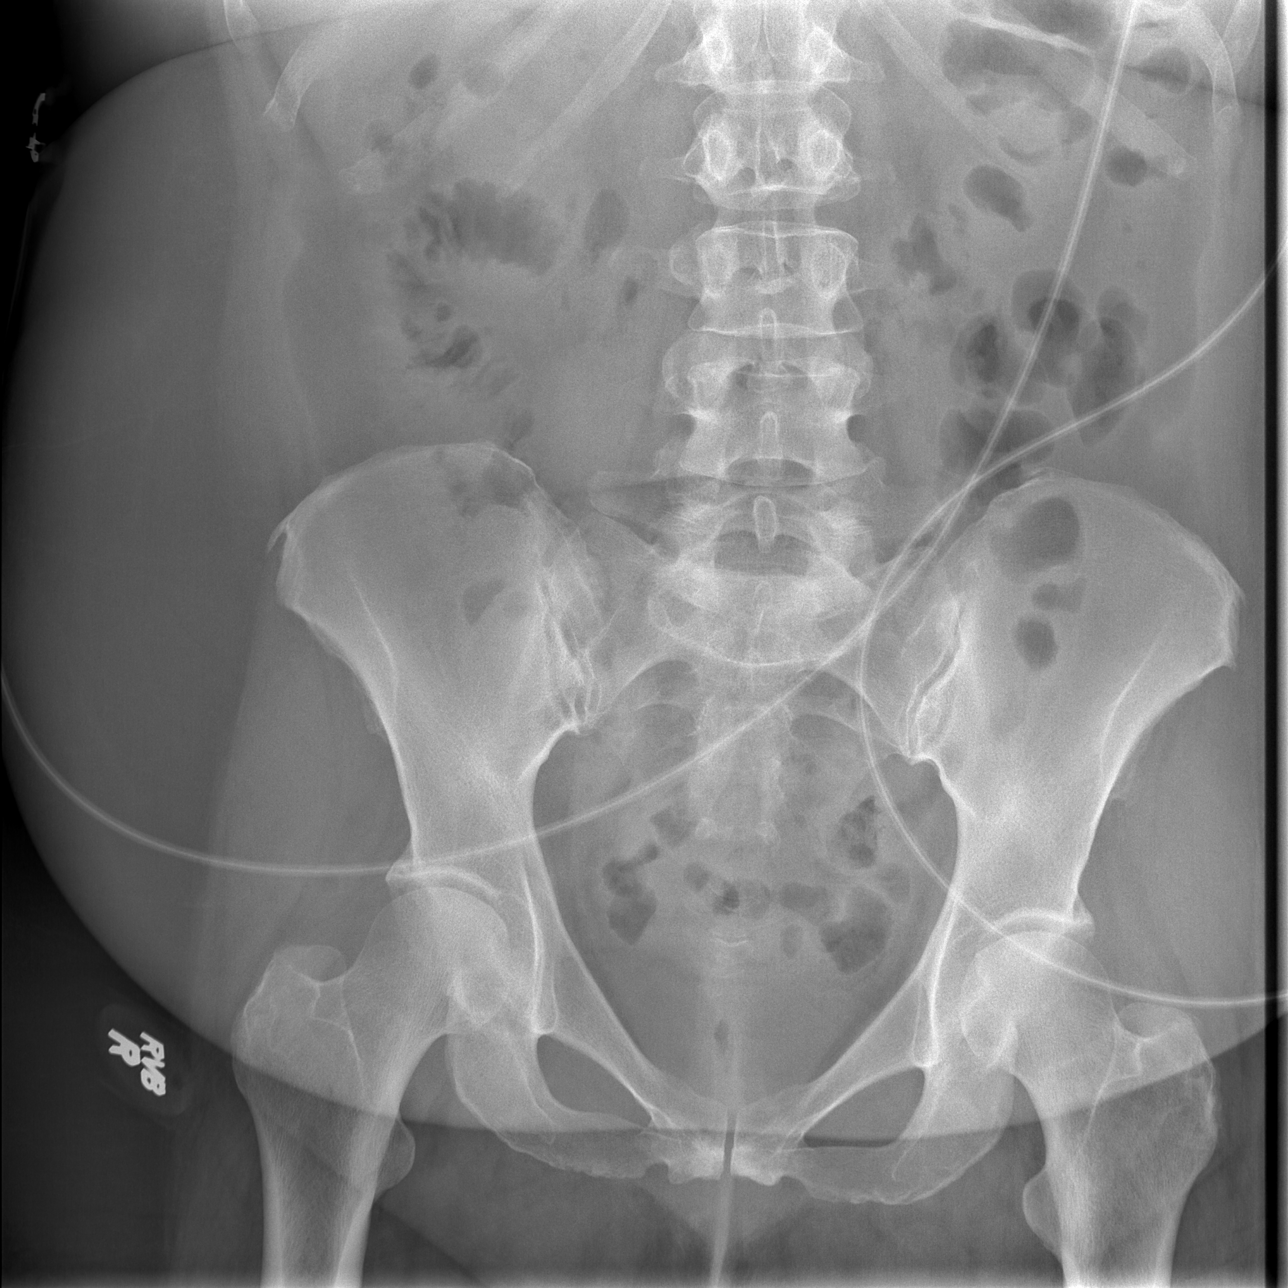

[3 of 3 positions shown; findings below may reference images not displayed]

FINDINGS: Lungs are clear.  Nasogastric tube present extending just
to the GE junction.  Bowel shows significant improvement with no
residual dilated small bowel loops identified.  Air is present in
nondistended colon.  No evidence of free air.
IMPRESSION: Radiographic resolution of small bowel obstruction.

## 2010-03-06 ENCOUNTER — Telehealth: Payer: Self-pay | Admitting: Internal Medicine

## 2010-03-14 ENCOUNTER — Ambulatory Visit: Payer: Self-pay | Admitting: Internal Medicine

## 2010-03-14 DIAGNOSIS — B370 Candidal stomatitis: Secondary | ICD-10-CM | POA: Insufficient documentation

## 2010-03-14 DIAGNOSIS — R634 Abnormal weight loss: Secondary | ICD-10-CM | POA: Insufficient documentation

## 2010-03-18 ENCOUNTER — Ambulatory Visit: Payer: Self-pay | Admitting: Oncology

## 2010-03-19 IMAGING — CT CT ABD-PELV W/ CM
1 of 3 series · 13 of 32 positions shown, 17 images · IV contrast (agent unspecified)
Comparison: Abdomen pelvis CT [DATE]

CLINICAL DATA: Abdominal pelvic pain.  The patient has non-Hodgkins
lymphoma.  Undergoing chemotherapy.

CT ABDOMEN AND PELVIS WITH CONTRAST
TECHNIQUE: Multidetector CT imaging of the abdomen and pelvis was
performed following the standard protocol during bolus
administration of intravenous contrast.
Contrast: 100 ml [7I]

[Series 2: rtn ap with st · axial · 0.72mm/px · z∈[-429,-34]mm · 13 of 89 slices shown, 17 images]
[im 5/89  soft-tissue]
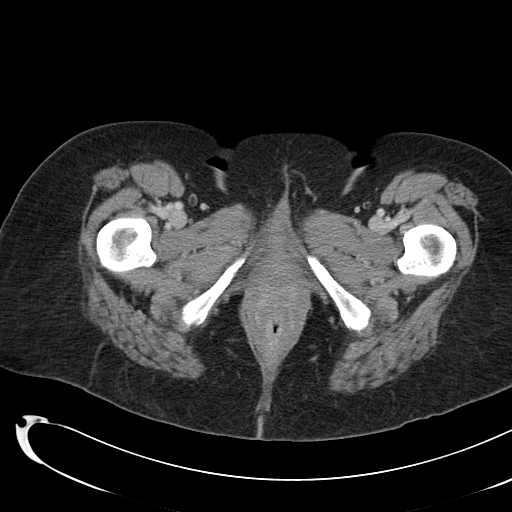
[im 5/89  bone]
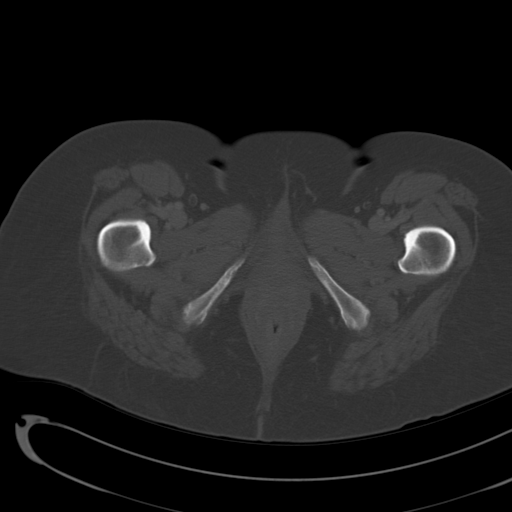
[im 14/89  soft-tissue]
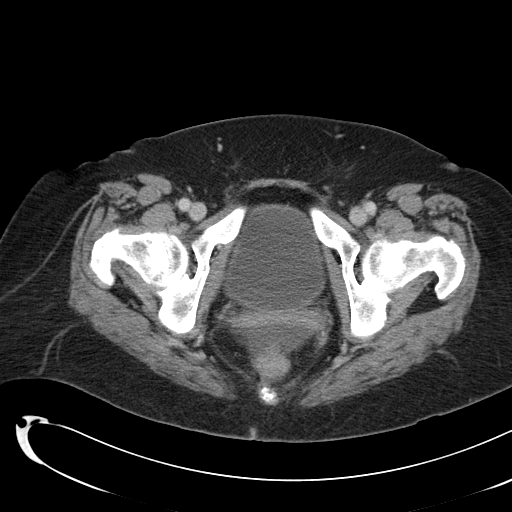
[im 24/89  soft-tissue]
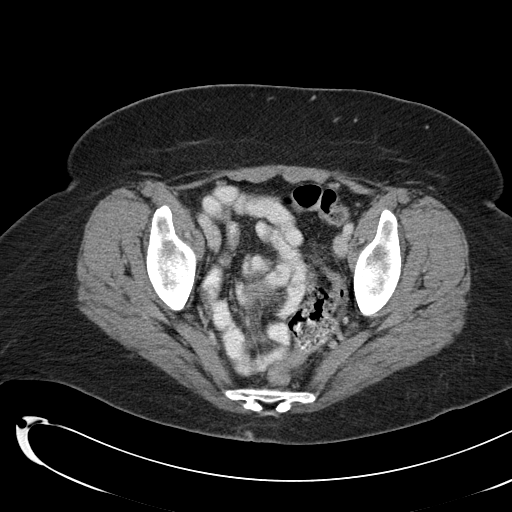
[im 28/89  soft-tissue]
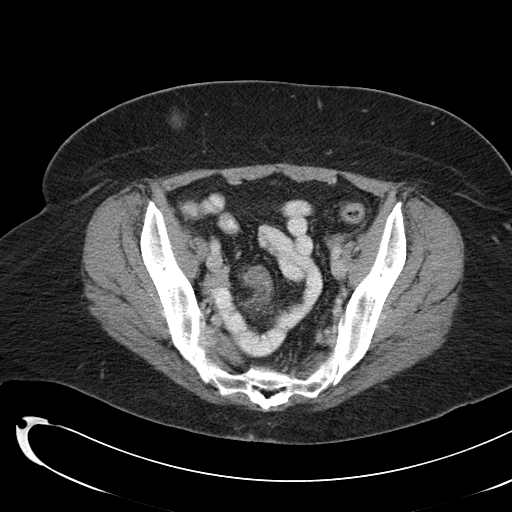
[im 38/89  soft-tissue]
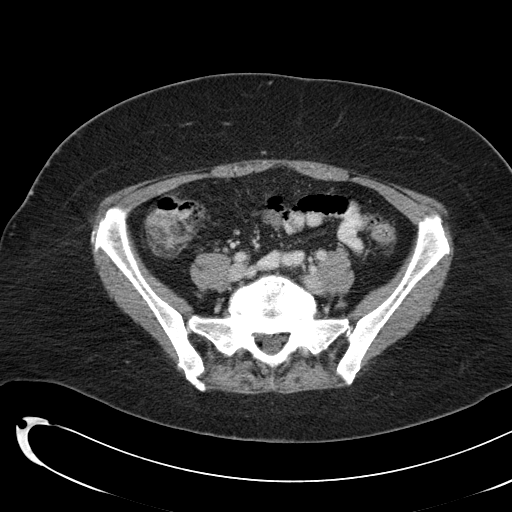
[im 47/89  soft-tissue]
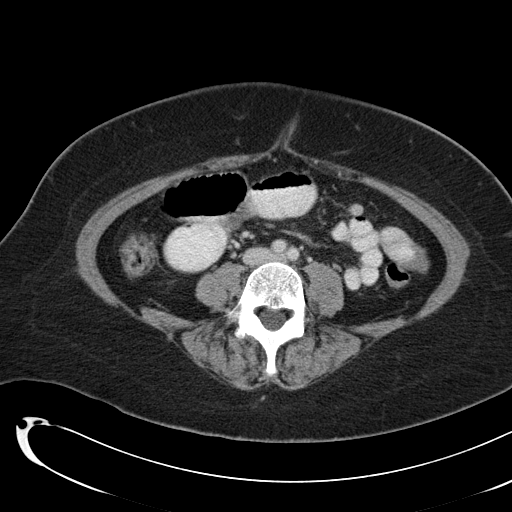
[im 51/89  soft-tissue]
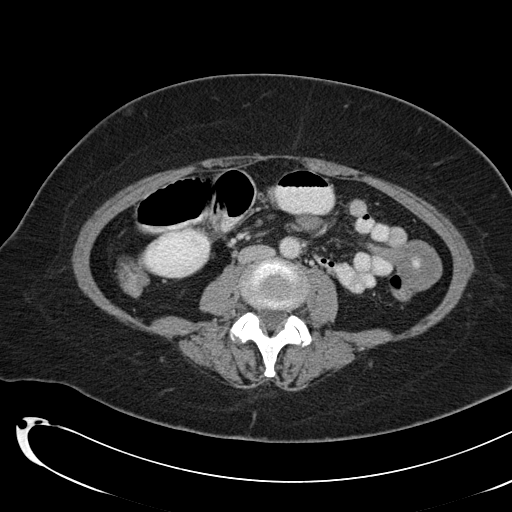
[im 61/89  soft-tissue]
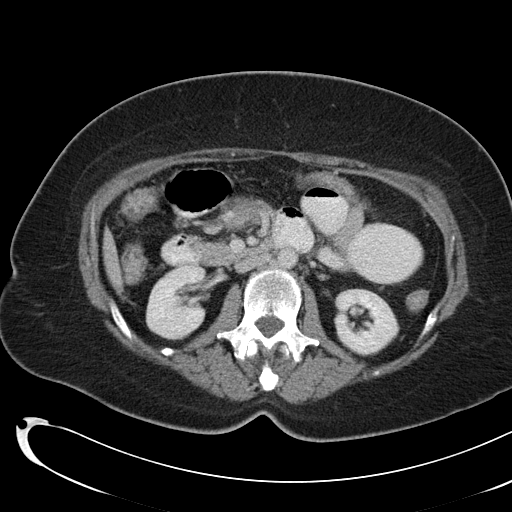
[im 65/89  soft-tissue]
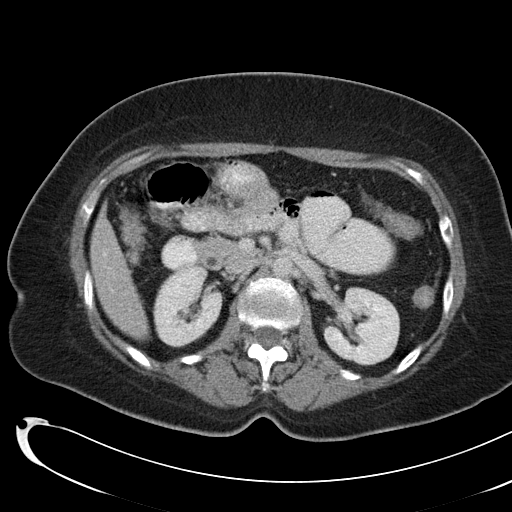
[im 65/89  bone]
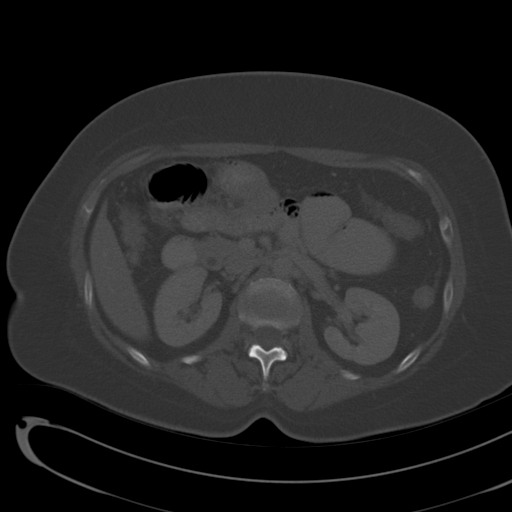
[im 70/89  lung]
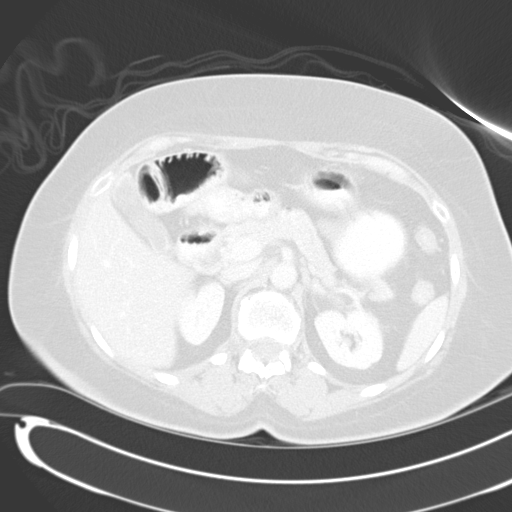
[im 75/89  soft-tissue]
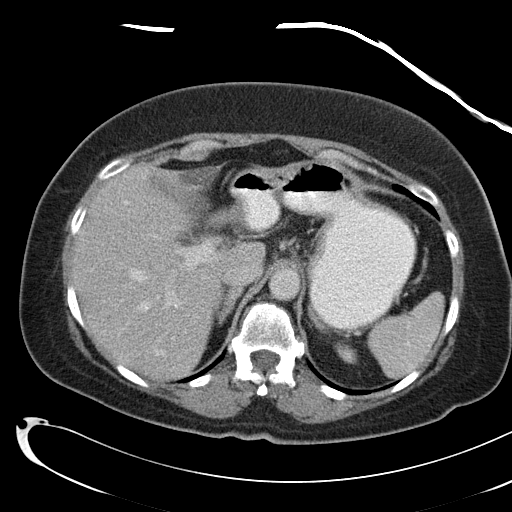
[im 75/89  lung]
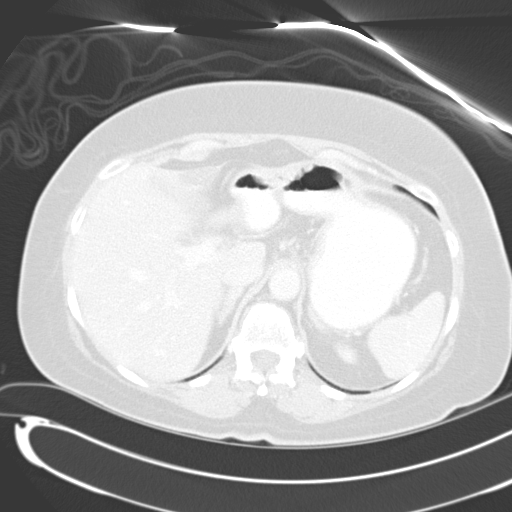
[im 79/89  lung]
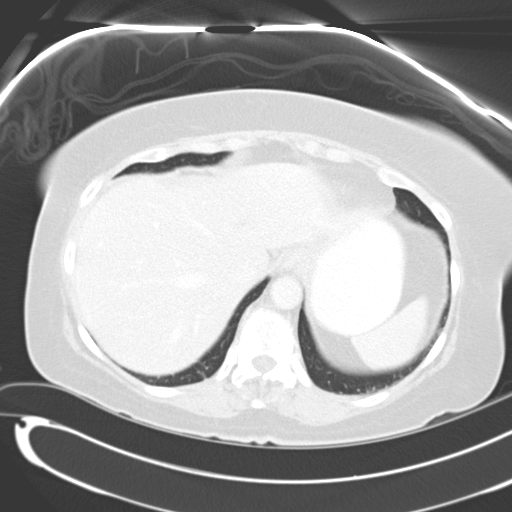
[im 84/89  soft-tissue]
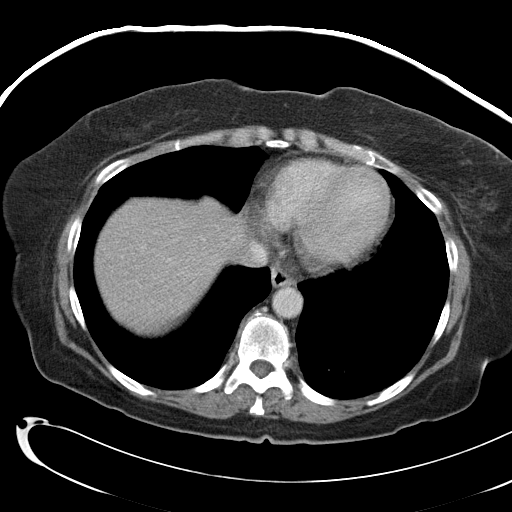
[im 84/89  lung]
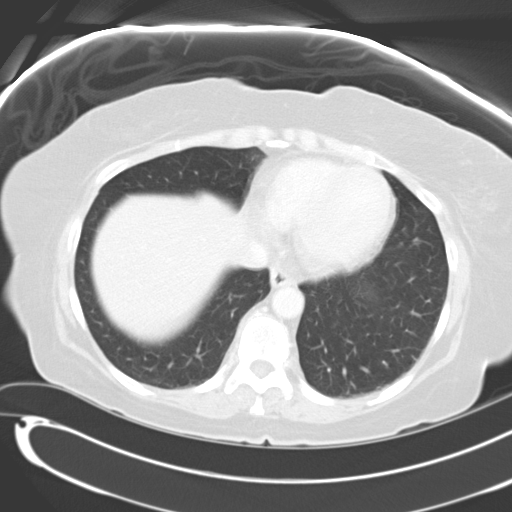

[13 of 32 positions shown; findings below may reference images not displayed]

FINDINGS: The lung bases are clear.

Negative for pleural effusion.

There is a partial small bowel obstruction, which is secondary to
marked circumferential mass-like thickening of a 4.6 cm segment of
a loop of jejunum in the left abdomen (image #38 of series 2).
Findings are suspicious for lymphoma of the bowel, given the stated
clinical history.  Proximal to this small bowel abnormality, small
bowel dilatation measures up to 3.8 cm.  There is progression of
oral contrast into normal caliber small bowel loops, beyond the
abnormal small bowel.

There is a small to moderate amount of ascites in the pelvis.
Previously identified soft tissue mass in the left anatomic pelvis,
seen [DATE], is no longer visualized.

There is abnormal soft tissue density, measuring 2.7 x 2.2 cm
within the small bowel mesentery of the anatomic pelvis, centrally,
with some surrounding stranding.  This is favored to be an enlarged
mesenteric lymph node.  In retrospect, there were prominent
mesenteric lymph nodes in the upper anatomic pelvis on the study [DATE].  A right abdominal mesenteric lymph node measures 3.3 x
1.7 cm (previously 5.0 x 3.5 cm).

The patient is status post hysterectomy.  Left ovary is visualized
and appears within normal limits.  Right ovary not definitely
identified.  No right adnexal mass.  Urinary bladder has normal
appearances.

The liver contains a subcapsular focal area of decreased
attenuation in the lateral to the falciform ligament, that area
measures 4.0 x 0.9 cm and appears new or progressive compared to
prior examination.  Metastatic disease to the liver is not
excluded.  The remainder the liver is homogeneous enhancement.  The
gallbladder, adrenal glands, kidneys, and pancreas are within
normal limits.  The spleen is normal in size and enhancement. There
are mild degenerative changes of the spine.  No acute or
destructive osseous lesion is identified.

New 1.5 cm soft tissue nodule in the subcutaneous fat of the lower
right anterior abdominal wall.
IMPRESSION: 1.  Partial small bowel obstruction secondary to a circumferential
soft tissue mass-like thickening of the a focal segment of a loop
of jejunum in the left abdomen.  Findings are suspicious for
malignancy in the small bowel, likely lymphoma, given the patient
reported history of non-Hodgkins lymphoma.
2.  2.7 x 2.2 cm small bowel mesenteric mass in the anatomic pelvis
is likely an enlarged mesenteric lymph node.  There are additional
smaller mesenteric lymph nodes, some of which have decreased since
the prior study.
3.  Small to moderate amount of pelvic ascites.
4.  Focal hepatic lesion appears new compared to prior study and is
suspicious for metastatic disease.  Further evaluation with
abdominal MRI could be considered, as clinically indicated.
4.  New soft tissue nodule within the subcutaneous fat of the right
lower abdominal wall (image #63).  This is of uncertain etiology.
Metastatic nodule cannot be excluded.

## 2010-03-20 ENCOUNTER — Ambulatory Visit: Payer: Self-pay | Admitting: Oncology

## 2010-03-21 IMAGING — CR DG ABDOMEN 2V
2 series · 2 of 2 positions shown · non-contrast
Comparison: CT dated [DATE] and plain film dated [DATE]

CLINICAL DATA: Shortness of breath and hypokalemia

ABDOMEN - 2 VIEW

[w abdomen upright *]
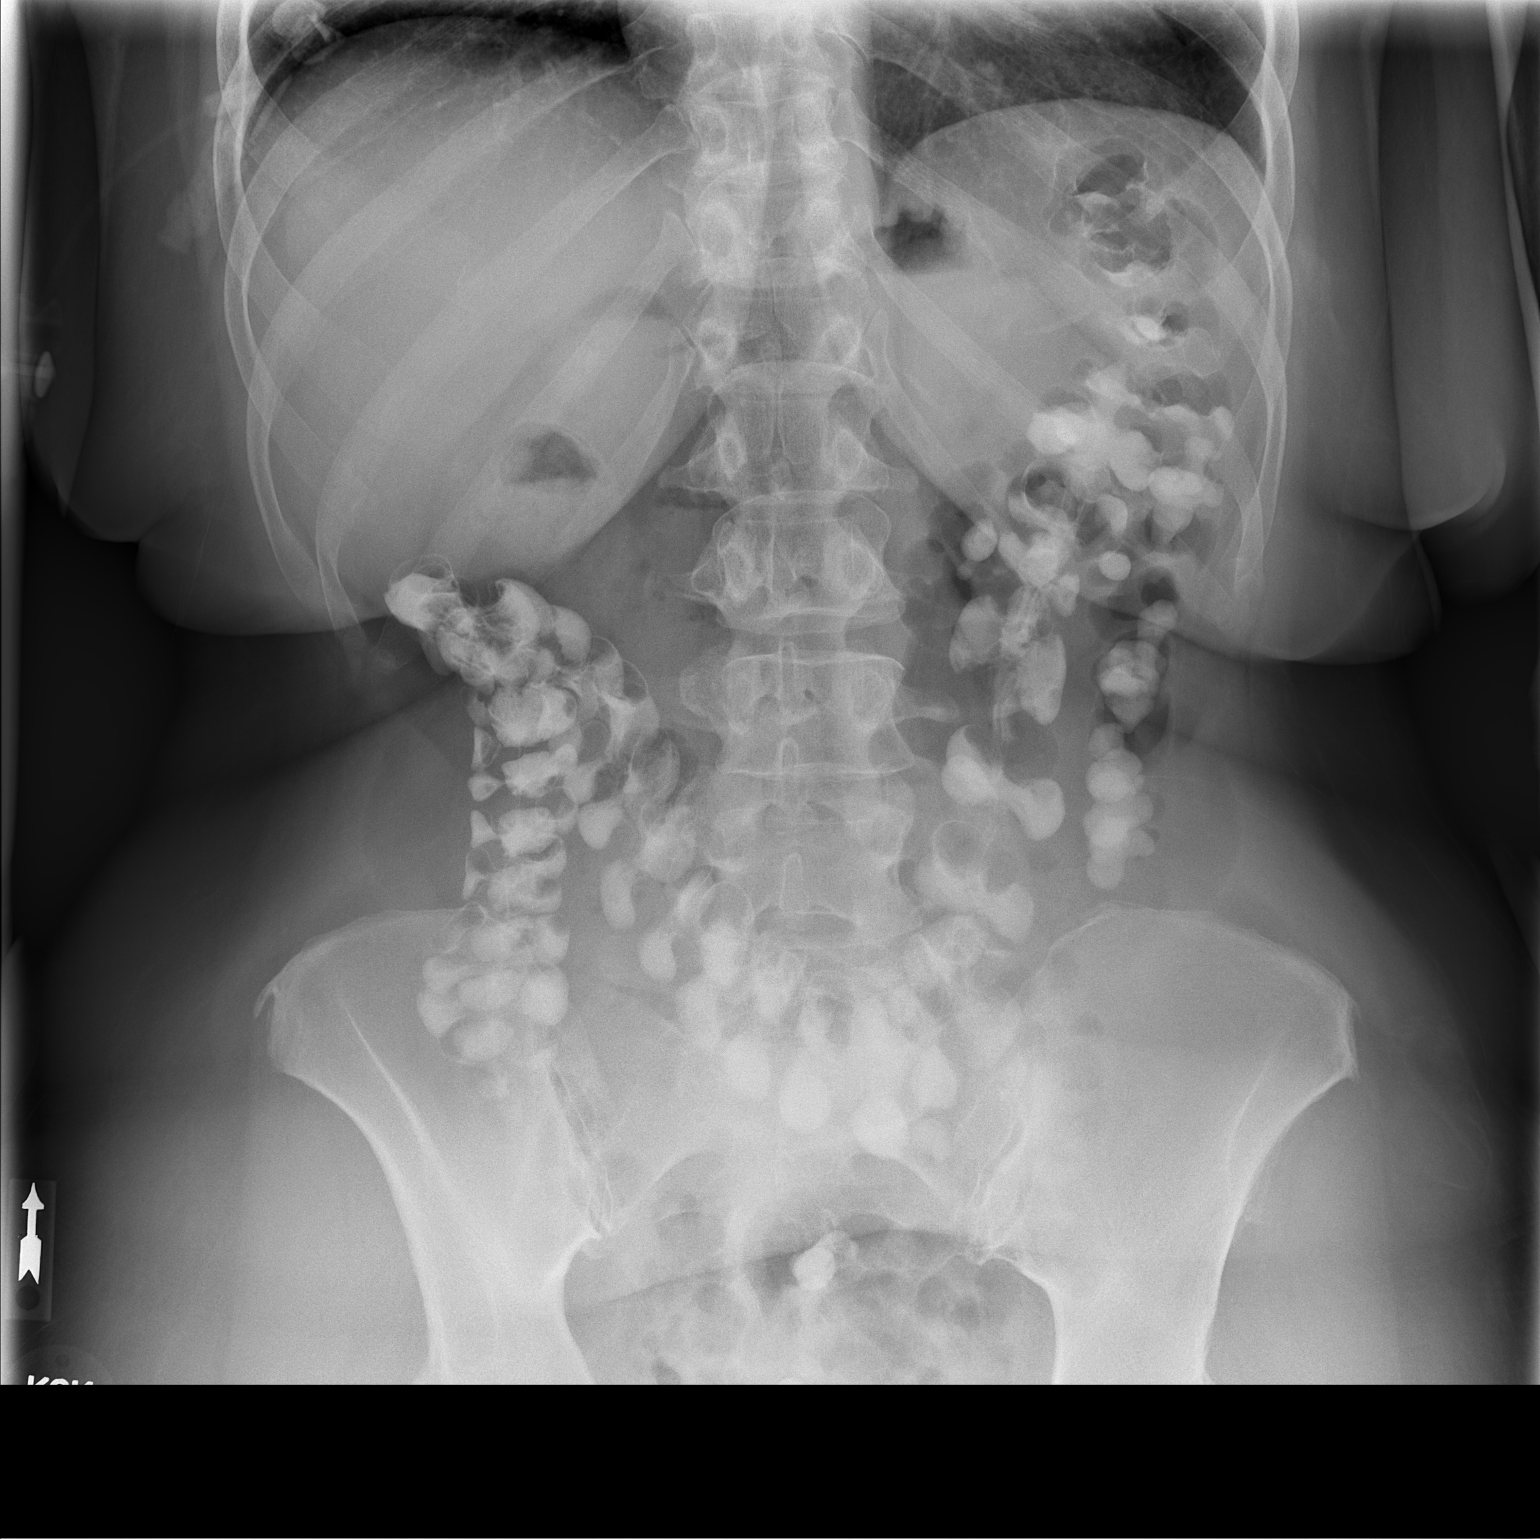

[t abdomen supine *]
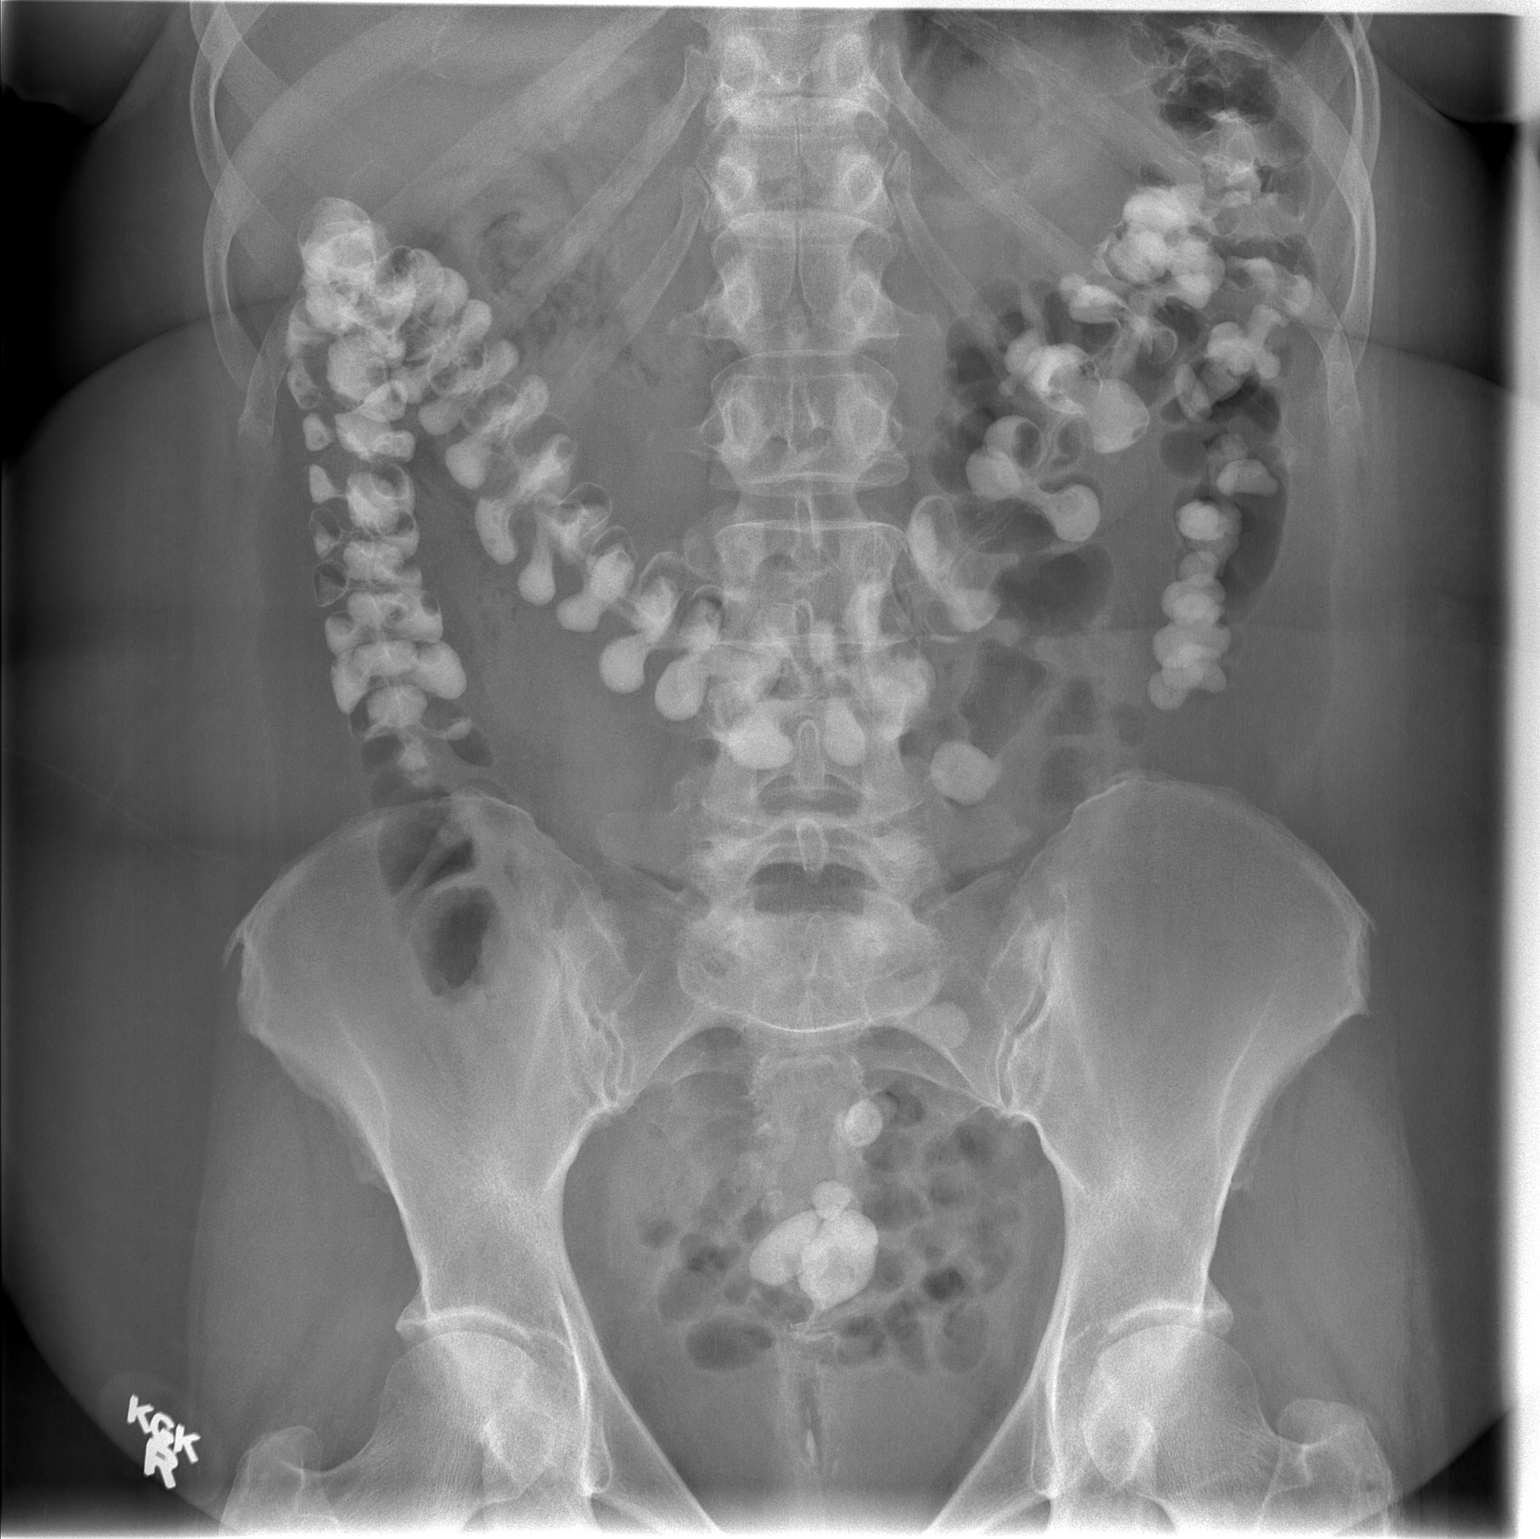

[2 of 2 positions shown; findings below may reference images not displayed]

FINDINGS: There are no dilated loops of small bowel seen on this
exam.  Residual contrast material is seen throughout the colon.
There is no intra-abdominal free air.  The lung bases are clear.
The bones are within normal limits.
IMPRESSION: Nonobstructive bowel gas pattern.

## 2010-03-26 ENCOUNTER — Encounter: Payer: Self-pay | Admitting: Internal Medicine

## 2010-03-26 LAB — CBC WITH DIFFERENTIAL/PLATELET
BASO%: 0.3 % (ref 0.0–2.0)
Basophils Absolute: 0 10*3/uL (ref 0.0–0.1)
Eosinophils Absolute: 0 10*3/uL (ref 0.0–0.5)
LYMPH%: 39.2 % (ref 14.0–49.7)
MCH: 25.7 pg (ref 25.1–34.0)
MCHC: 33.9 g/dL (ref 31.5–36.0)
MCV: 75.9 fL — ABNORMAL LOW (ref 79.5–101.0)
MONO%: 15 % — ABNORMAL HIGH (ref 0.0–14.0)
Platelets: 211 10*3/uL (ref 145–400)
RBC: 4.94 10*6/uL (ref 3.70–5.45)
lymph#: 1.2 10*3/uL (ref 0.9–3.3)

## 2010-03-26 LAB — COMPREHENSIVE METABOLIC PANEL
ALT: 23 U/L (ref 0–35)
Albumin: 3.7 g/dL (ref 3.5–5.2)
Alkaline Phosphatase: 59 U/L (ref 39–117)
BUN: 5 mg/dL — ABNORMAL LOW (ref 6–23)
Calcium: 9.4 mg/dL (ref 8.4–10.5)
Creatinine, Ser: 0.91 mg/dL (ref 0.40–1.20)
Sodium: 139 mEq/L (ref 135–145)
Total Protein: 5.8 g/dL — ABNORMAL LOW (ref 6.0–8.3)

## 2010-03-26 LAB — LACTATE DEHYDROGENASE: LDH: 281 U/L — ABNORMAL HIGH (ref 94–250)

## 2010-04-01 ENCOUNTER — Ambulatory Visit: Payer: Self-pay | Admitting: Internal Medicine

## 2010-04-19 ENCOUNTER — Ambulatory Visit: Payer: Self-pay | Admitting: Oncology

## 2010-04-22 ENCOUNTER — Emergency Department (HOSPITAL_COMMUNITY): Admission: EM | Admit: 2010-04-22 | Discharge: 2010-04-22 | Payer: Self-pay | Admitting: Emergency Medicine

## 2010-04-23 ENCOUNTER — Encounter: Payer: Self-pay | Admitting: Internal Medicine

## 2010-04-23 LAB — CBC WITH DIFFERENTIAL/PLATELET
BASO%: 0.2 % (ref 0.0–2.0)
Basophils Absolute: 0 10*3/uL (ref 0.0–0.1)
EOS%: 3.2 % (ref 0.0–7.0)
HGB: 12.1 g/dL (ref 11.6–15.9)
MCH: 27.7 pg (ref 25.1–34.0)
MCHC: 34.1 g/dL (ref 31.5–36.0)
MCV: 81.3 fL (ref 79.5–101.0)
MONO%: 7.3 % (ref 0.0–14.0)
RDW: 19.7 % — ABNORMAL HIGH (ref 11.2–14.5)

## 2010-04-23 LAB — COMPREHENSIVE METABOLIC PANEL
ALT: 18 U/L (ref 0–35)
AST: 24 U/L (ref 0–37)
Albumin: 3.6 g/dL (ref 3.5–5.2)
Alkaline Phosphatase: 62 U/L (ref 39–117)
BUN: 13 mg/dL (ref 6–23)
Creatinine, Ser: 1.41 mg/dL — ABNORMAL HIGH (ref 0.40–1.20)
Potassium: 4.1 mEq/L (ref 3.5–5.3)

## 2010-04-29 ENCOUNTER — Inpatient Hospital Stay (HOSPITAL_COMMUNITY)
Admission: EM | Admit: 2010-04-29 | Discharge: 2010-05-09 | Payer: Self-pay | Source: Home / Self Care | Admitting: Emergency Medicine

## 2010-04-29 IMAGING — CR DG ABDOMEN ACUTE W/ 1V CHEST
3 series · 3 of 3 positions shown · non-contrast
Comparison: Two-view abdomen x-ray [DATE].  CT abdomen pelvis
[DATE].  Acute abdomen series [DATE].

CLINICAL DATA: Abdominal pain.  Nausea and vomiting.  History of
lymphoma.  Recent partial small bowel obstruction.

ACUTE ABDOMEN SERIES (ABDOMEN 2 VIEW & CHEST 1 VIEW) [DATE]:

[w chest pa]
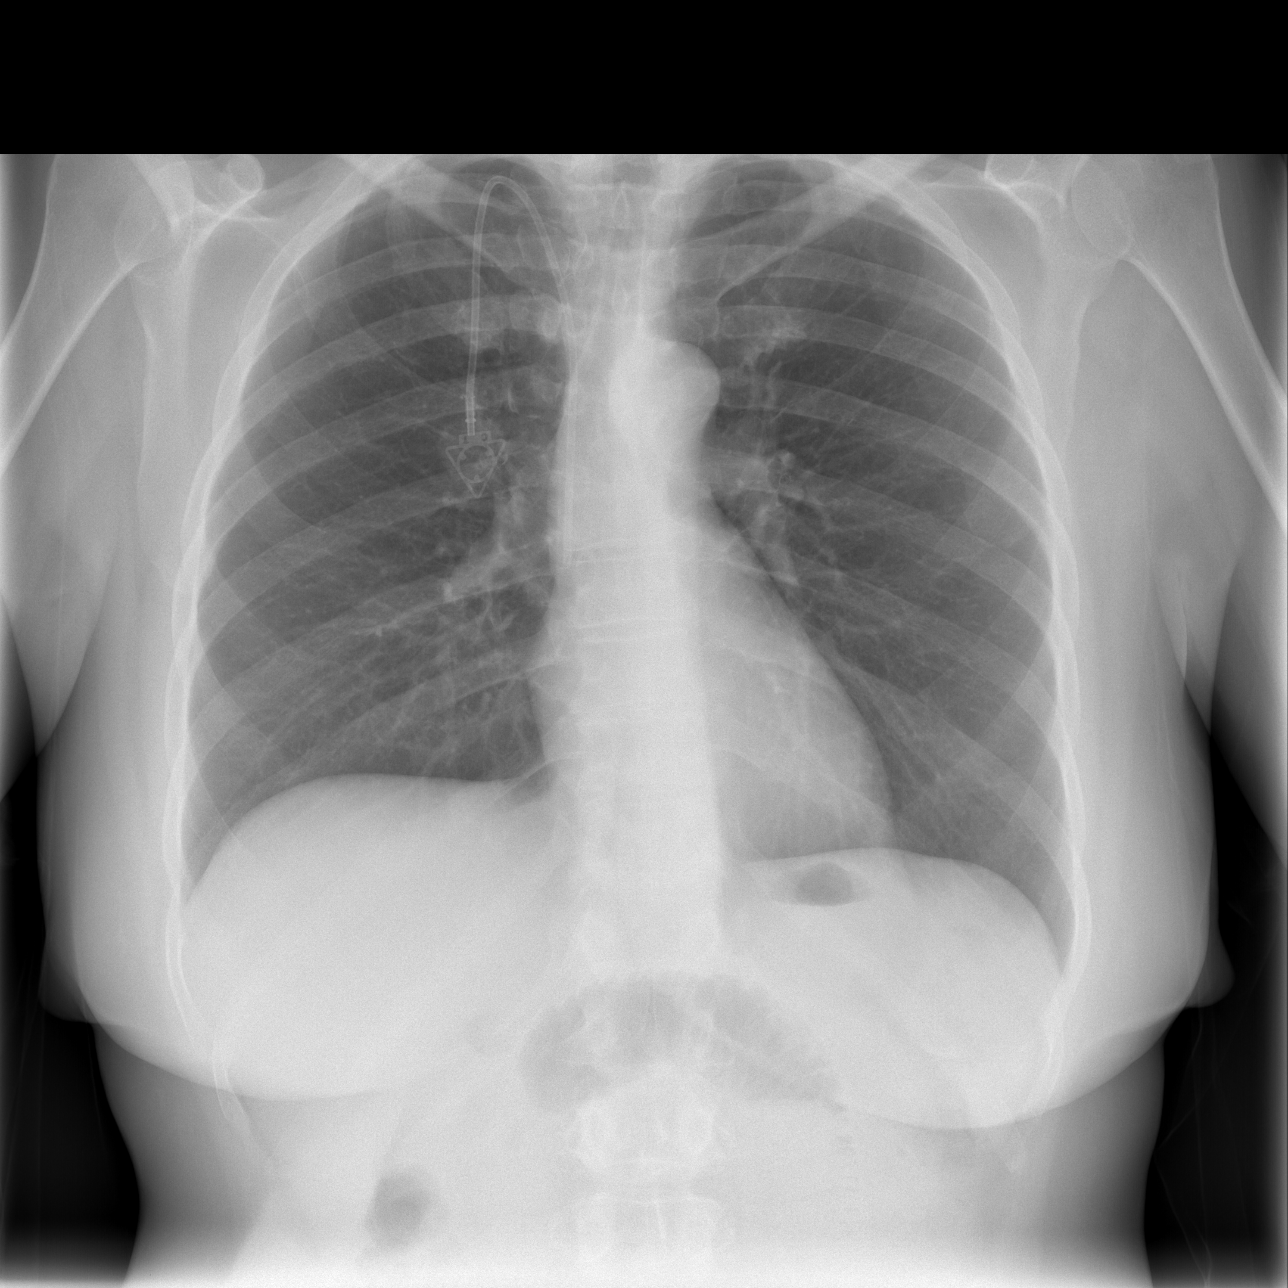

[w abdomen upright *]
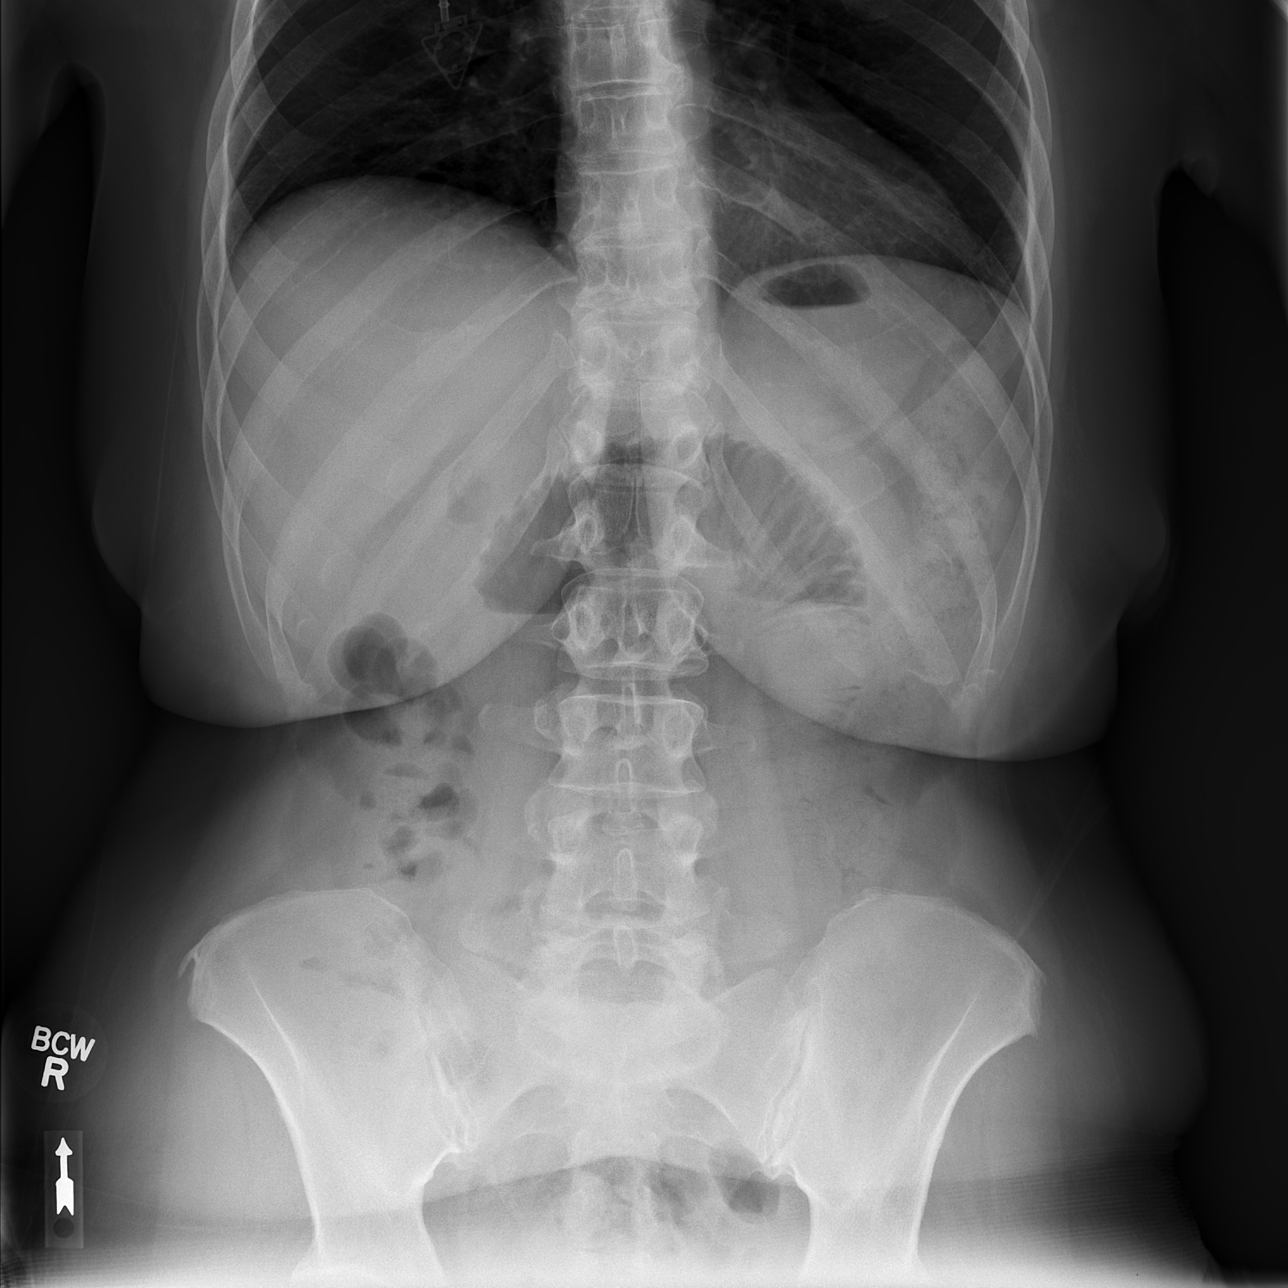

[t abdomen supine]
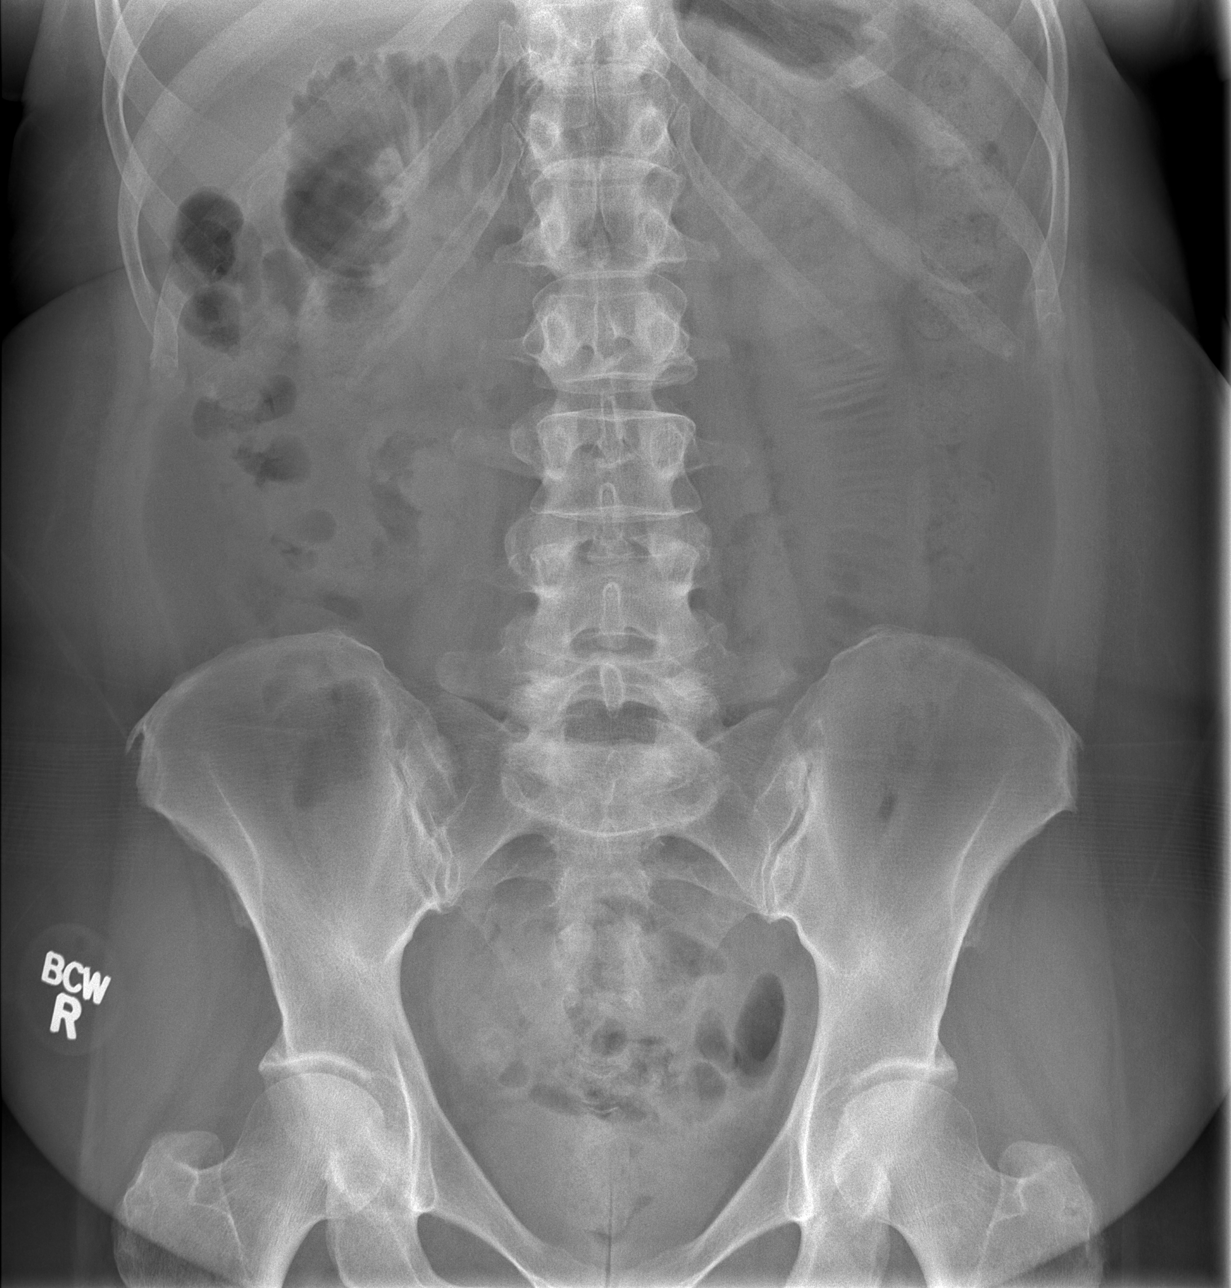

[3 of 3 positions shown; findings below may reference images not displayed]

FINDINGS: Recurrent gaseous distention of numerous loops of small
bowel in the upper abdomen, demonstrating air fluid levels on the
erect image.  Gas and liquid stool throughout normal caliber colon
from cecum to rectum.  No evidence of free intraperitoneal air.  No
abnormal calcifications.  Regional skeleton unremarkable.

Cardiomediastinal silhouette unremarkable.  Lungs clear.  No
pleural effusions.  Right jugular Port-A-Cath tip in the SVC.
IMPRESSION: 1.  Partial small bowel obstruction.  No free intraperitoneal air.
2.  No acute cardiopulmonary disease.

## 2010-04-30 ENCOUNTER — Ambulatory Visit: Payer: Self-pay | Admitting: Oncology

## 2010-04-30 IMAGING — CT CT ABD-PELV W/ CM
2 of 5 series · 16 of 46 positions shown, 18 images · IV contrast (agent unspecified)
Comparison: [DATE]

CLINICAL DATA: Small bowel obstruction.  Abdominal pain and
vomiting.  Non-Hodgkins lymphoma.  Currently undergoing
chemotherapy.  Neutropenia.

CT ABDOMEN AND PELVIS WITH CONTRAST
TECHNIQUE: Multidetector CT imaging of the abdomen and pelvis was
performed following the standard protocol during bolus
administration of intravenous contrast.
Contrast: 100 ml [UZ] and oral contrast

[Series 2: rtn a/p with · axial · 0.74mm/px · z∈[-402,-17]mm · 13 of 87 slices shown, 15 images]
[im 5/87  soft-tissue]
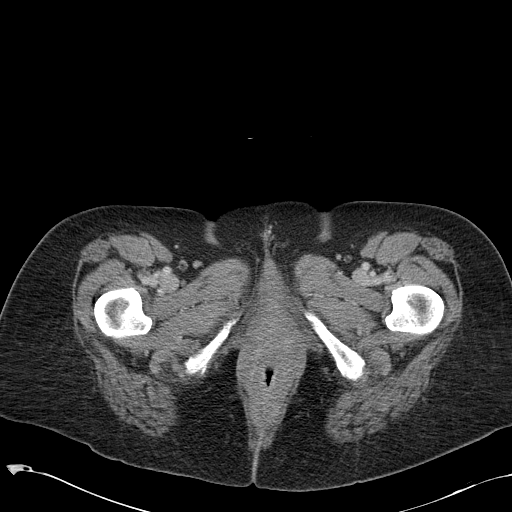
[im 5/87  bone]
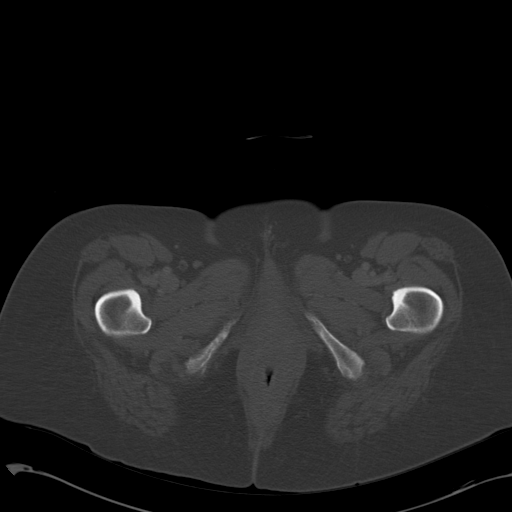
[im 13/87  soft-tissue]
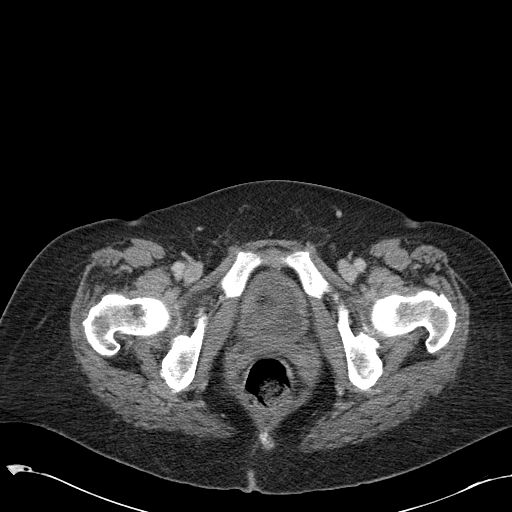
[im 18/87  soft-tissue]
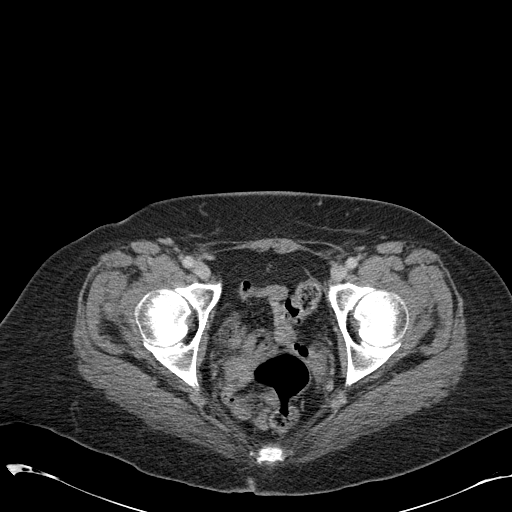
[im 26/87  soft-tissue]
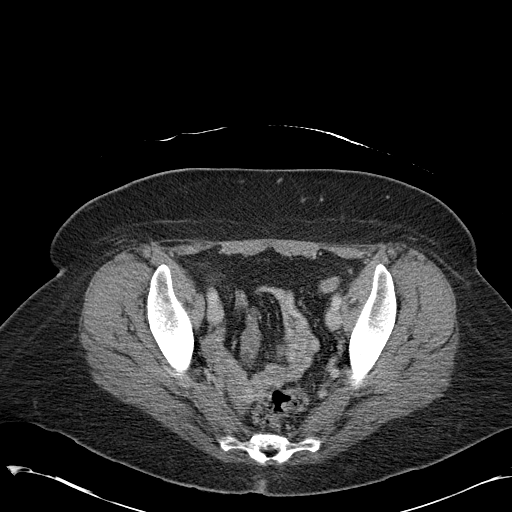
[im 31/87  soft-tissue]
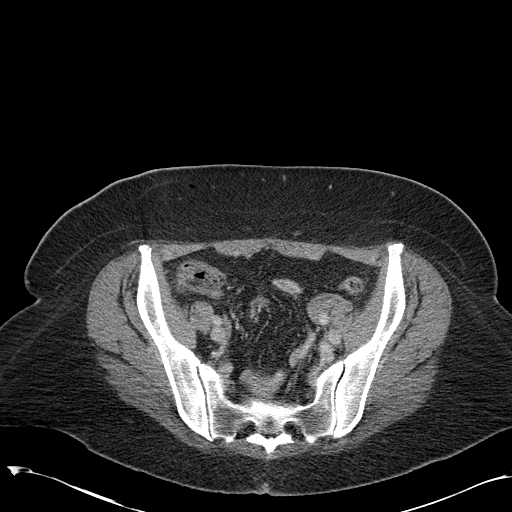
[im 39/87  soft-tissue]
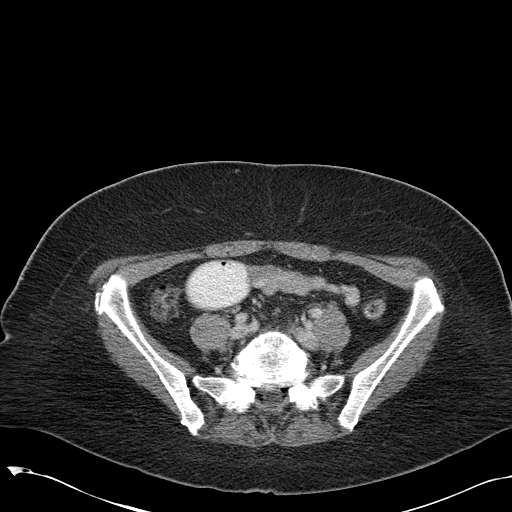
[im 44/87  soft-tissue]
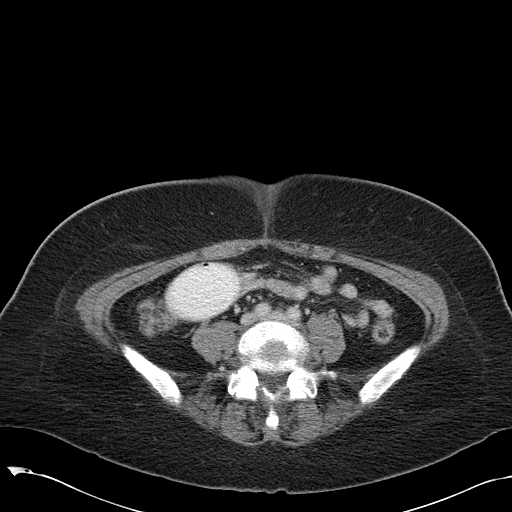
[im 48/87  soft-tissue]
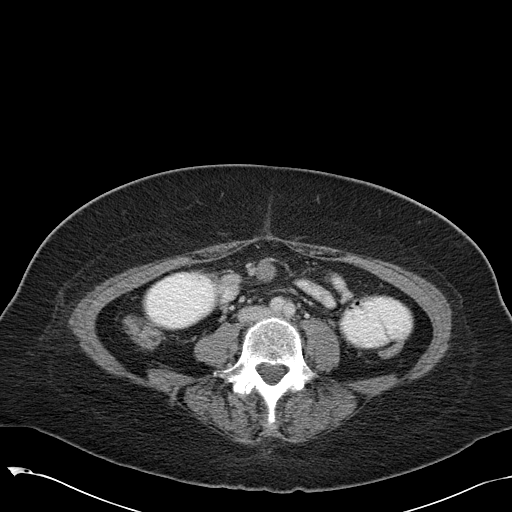
[im 56/87  soft-tissue]
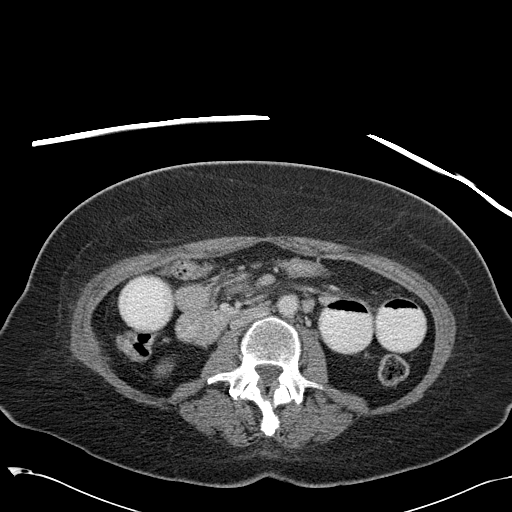
[im 56/87  bone]
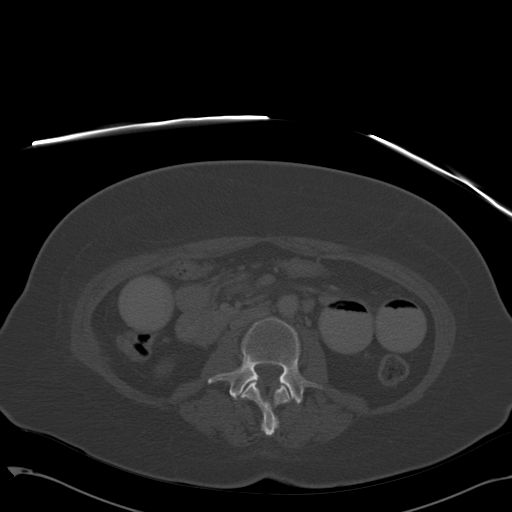
[im 61/87  soft-tissue]
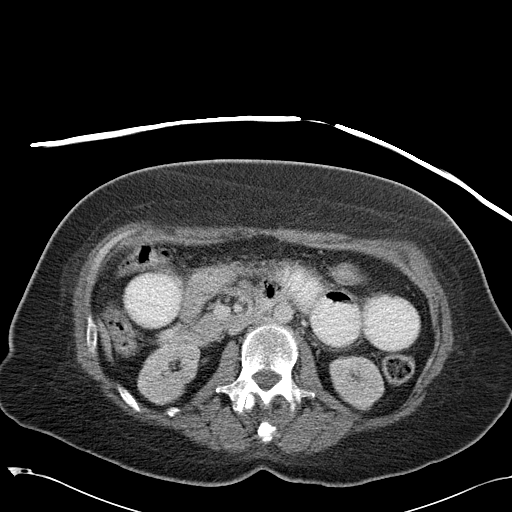
[im 69/87  soft-tissue]
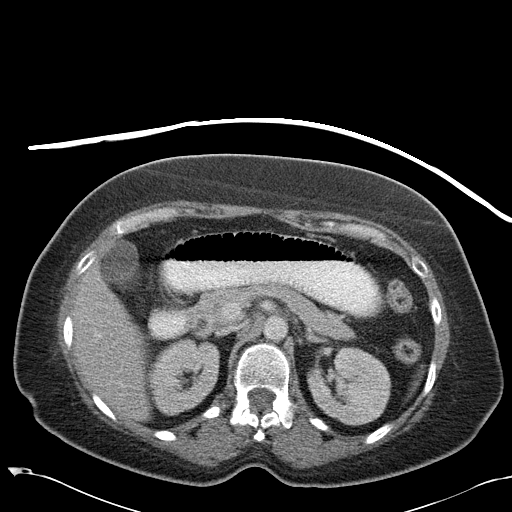
[im 74/87  soft-tissue]
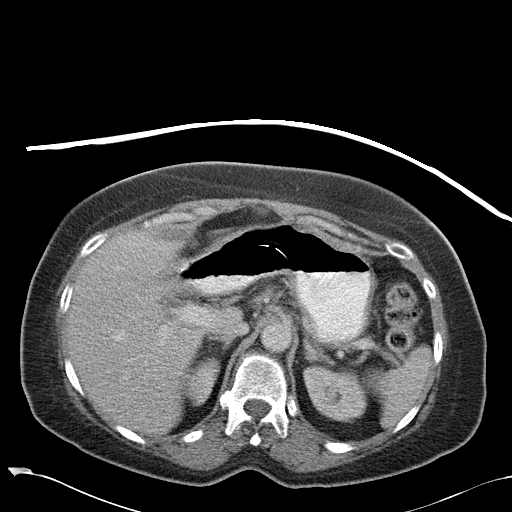
[im 82/87  soft-tissue]
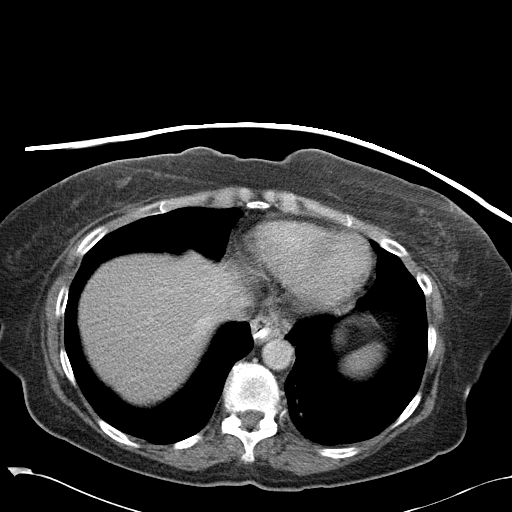

[Series 602: coronal images · coronal · 0.85mm/px · 3 of 73 slices shown]
[im 25/73  soft-tissue]
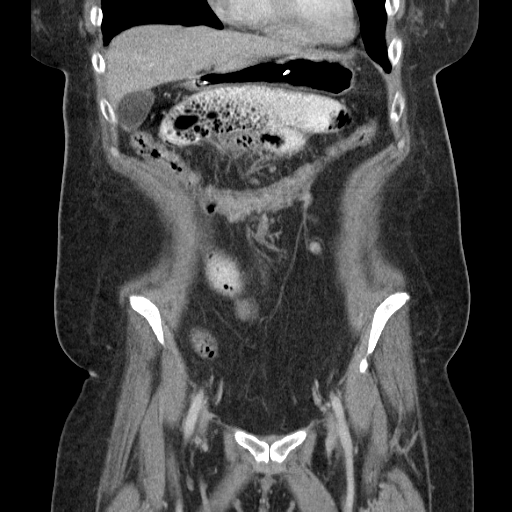
[im 33/73  soft-tissue]
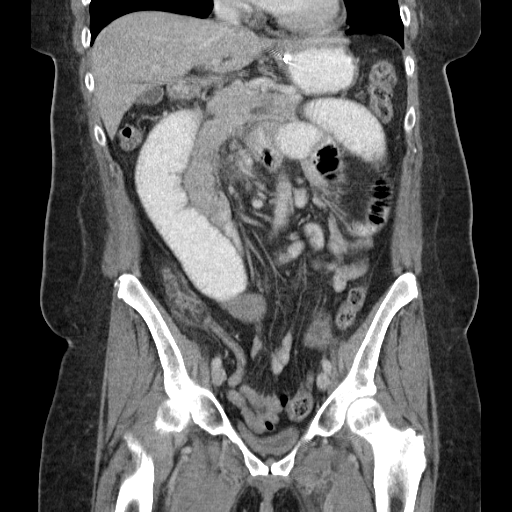
[im 41/73  soft-tissue]
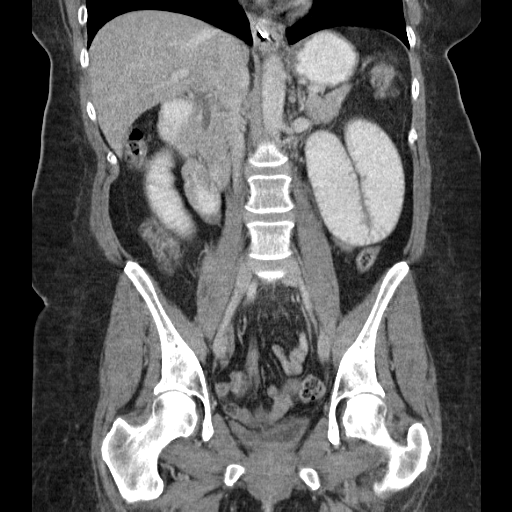

[16 of 46 positions shown; findings below may reference images not displayed]

FINDINGS: Nasogastric tube is now seen in the stomach.  There is
persistent dilatation of proximal and mid small bowel, which shows
no significant change.  A transition point is seen in the central
pelvis, which has the appearance of a circumferential mass lesion
involving the small bowel.  This measures approximately 3.2 x
cm, and is similar appearance to that seen on prior exam.

A mesenteric soft tissue mass is again seen in the central pelvis
measuring approximately 3.5 x 2.2 cm, and other smaller sites of
mesenteric soft tissue masses or adenopathy are again seen within
the abdomen.  These also show no significant change.  These
findings are consistent with known history non-Hodgkins lymphoma.

No evidence of inflammatory process or ascites.  Poorly defined low
attenuation lesion in the medial segment of the left hepatic lobe
measuring approximately 1.4 by 4.0 cm shows no significant change.
This is a indeterminate, and could be due to neoplasm or a somewhat
atypical pattern of focal fatty infiltration.  No other liver
lesions are identified.

There is no evidence of splenomegaly or splenic lesions.  The
pancreas, adrenal glands, and kidneys are normal in appearance.  No
retroperitoneal lymphadenopathy identified.
IMPRESSION: 1.  No significant change  in partial small bowel obstruction due
to circumferential mass lesion.  Other mesenteric masses/adenopathy
also show no significant change.  These findings are consistent
with known history of non-Hodgkins lymphoma.
2.  Stable indeterminate low attenuation lesion in the left hepatic
lobe, which may be due to neoplasm or atypical pattern of focal
fatty infiltration.  Abdomen MRI without and with contrast is
recommended for further characterization.

## 2010-05-02 ENCOUNTER — Encounter (INDEPENDENT_AMBULATORY_CARE_PROVIDER_SITE_OTHER): Payer: Self-pay

## 2010-05-20 ENCOUNTER — Ambulatory Visit: Payer: Self-pay | Admitting: Oncology

## 2010-05-21 ENCOUNTER — Encounter: Payer: Self-pay | Admitting: Internal Medicine

## 2010-05-21 LAB — CBC WITH DIFFERENTIAL/PLATELET
Basophils Absolute: 0 10*3/uL (ref 0.0–0.1)
EOS%: 1.1 % (ref 0.0–7.0)
HCT: 31.1 % — ABNORMAL LOW (ref 34.8–46.6)
HGB: 10.3 g/dL — ABNORMAL LOW (ref 11.6–15.9)
MCH: 28 pg (ref 25.1–34.0)
MCV: 84.5 fL (ref 79.5–101.0)
MONO%: 12.4 % (ref 0.0–14.0)
NEUT%: 31.3 % — ABNORMAL LOW (ref 38.4–76.8)

## 2010-05-21 LAB — COMPREHENSIVE METABOLIC PANEL
Albumin: 3.1 g/dL — ABNORMAL LOW (ref 3.5–5.2)
Alkaline Phosphatase: 78 U/L (ref 39–117)
BUN: 5 mg/dL — ABNORMAL LOW (ref 6–23)
Creatinine, Ser: 1.09 mg/dL (ref 0.40–1.20)
Glucose, Bld: 113 mg/dL — ABNORMAL HIGH (ref 70–99)
Total Bilirubin: 0.7 mg/dL (ref 0.3–1.2)

## 2010-05-30 ENCOUNTER — Inpatient Hospital Stay (HOSPITAL_COMMUNITY): Admission: EM | Admit: 2010-05-30 | Discharge: 2010-03-21 | Payer: Self-pay | Admitting: Emergency Medicine

## 2010-06-18 ENCOUNTER — Encounter: Payer: Self-pay | Admitting: Internal Medicine

## 2010-06-20 ENCOUNTER — Ambulatory Visit: Payer: Self-pay | Admitting: Oncology

## 2010-06-26 ENCOUNTER — Ambulatory Visit (HOSPITAL_BASED_OUTPATIENT_CLINIC_OR_DEPARTMENT_OTHER): Payer: BC Managed Care – PPO | Admitting: Oncology

## 2010-06-26 LAB — CBC WITH DIFFERENTIAL/PLATELET
BASO%: 0 % (ref 0.0–2.0)
Basophils Absolute: 0 10*3/uL (ref 0.0–0.1)
EOS%: 9.9 % — ABNORMAL HIGH (ref 0.0–7.0)
Eosinophils Absolute: 0.2 10*3/uL (ref 0.0–0.5)
HCT: 29.4 % — ABNORMAL LOW (ref 34.8–46.6)
HGB: 9.9 g/dL — ABNORMAL LOW (ref 11.6–15.9)
LYMPH%: 61.8 % — ABNORMAL HIGH (ref 14.0–49.7)
MCH: 28.9 pg (ref 25.1–34.0)
MCHC: 33.7 g/dL (ref 31.5–36.0)
MCV: 86 fL (ref 79.5–101.0)
MONO#: 0.1 10*3/uL (ref 0.1–0.9)
MONO%: 3.3 % (ref 0.0–14.0)
NEUT#: 0.4 10*3/uL — CL (ref 1.5–6.5)
NEUT%: 25 % — ABNORMAL LOW (ref 38.4–76.8)
Platelets: 72 10*3/uL — ABNORMAL LOW (ref 145–400)
RBC: 3.42 10*6/uL — ABNORMAL LOW (ref 3.70–5.45)
RDW: 15.9 % — ABNORMAL HIGH (ref 11.2–14.5)
WBC: 1.5 10*3/uL — ABNORMAL LOW (ref 3.9–10.3)
lymph#: 0.9 10*3/uL (ref 0.9–3.3)

## 2010-06-26 LAB — COMPREHENSIVE METABOLIC PANEL
ALT: 14 U/L (ref 0–35)
AST: 23 U/L (ref 0–37)
Albumin: 3 g/dL — ABNORMAL LOW (ref 3.5–5.2)
Alkaline Phosphatase: 58 U/L (ref 39–117)
BUN: 7 mg/dL (ref 6–23)
CO2: 29 mEq/L (ref 19–32)
Calcium: 9.3 mg/dL (ref 8.4–10.5)
Chloride: 101 mEq/L (ref 96–112)
Creatinine, Ser: 1.69 mg/dL — ABNORMAL HIGH (ref 0.40–1.20)
Glucose, Bld: 99 mg/dL (ref 70–99)
Potassium: 3.9 mEq/L (ref 3.5–5.3)
Sodium: 140 mEq/L (ref 135–145)
Total Bilirubin: 0.5 mg/dL (ref 0.3–1.2)
Total Protein: 5.7 g/dL — ABNORMAL LOW (ref 6.0–8.3)

## 2010-07-08 ENCOUNTER — Ambulatory Visit (HOSPITAL_COMMUNITY)
Admission: RE | Admit: 2010-07-08 | Discharge: 2010-07-08 | Payer: Self-pay | Source: Home / Self Care | Attending: Oncology | Admitting: Oncology

## 2010-07-08 IMAGING — CT CT ABD-PELV W/ CM
2 of 5 series · 16 of 46 positions shown, 18 images · IV contrast (agent unspecified)
Comparison: Multiple exams, including [DATE]

CT CHEST

CLINICAL DATA: Lymphoma.  Chemotherapy in progress.  Weight loss.
Abdominal cramping.

CT CHEST, ABDOMEN AND PELVIS WITH CONTRAST
TECHNIQUE: Multidetector CT imaging of the chest, abdomen and
pelvis was performed following the standard protocol during bolus
administration of intravenous contrast.
Contrast: 80 ml [KJ]

[Series 2: cap with st · axial · 0.77mm/px · z∈[-598,-48]mm · 13 of 124 slices shown, 15 images]
[im 7/124  soft-tissue]
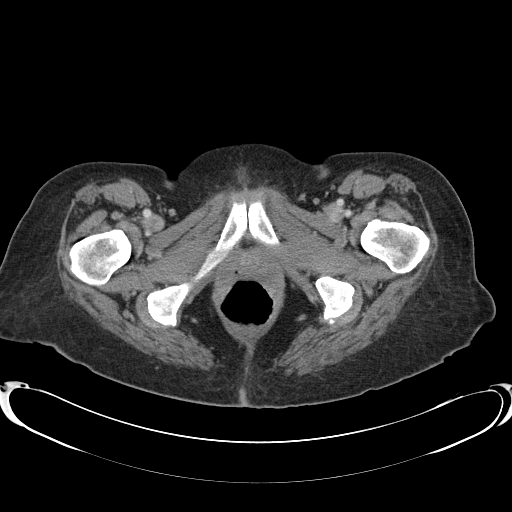
[im 7/124  bone]
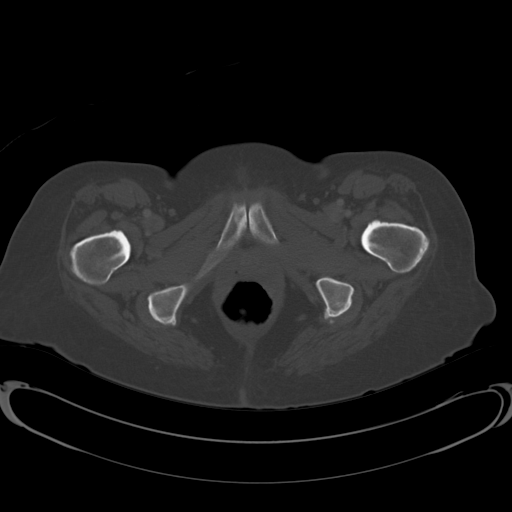
[im 20/124  soft-tissue]
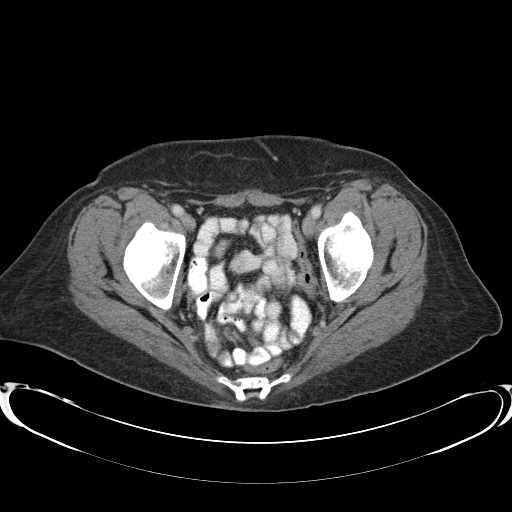
[im 26/124  soft-tissue]
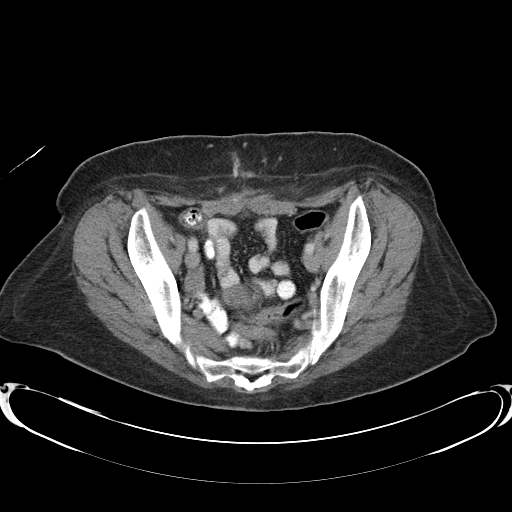
[im 33/124  soft-tissue]
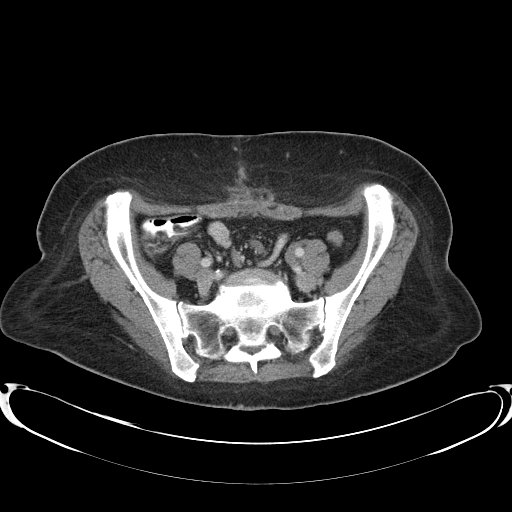
[im 46/124  soft-tissue]
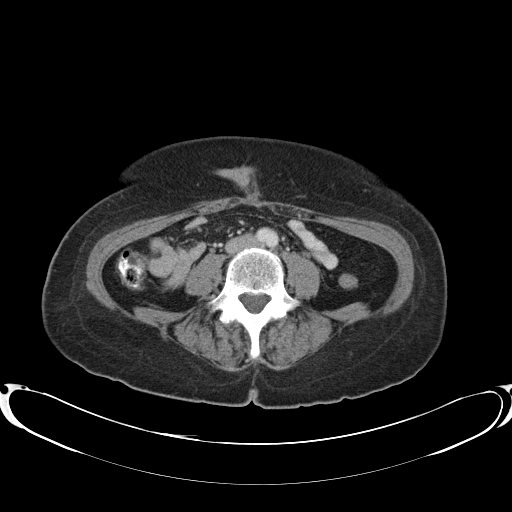
[im 52/124  soft-tissue]
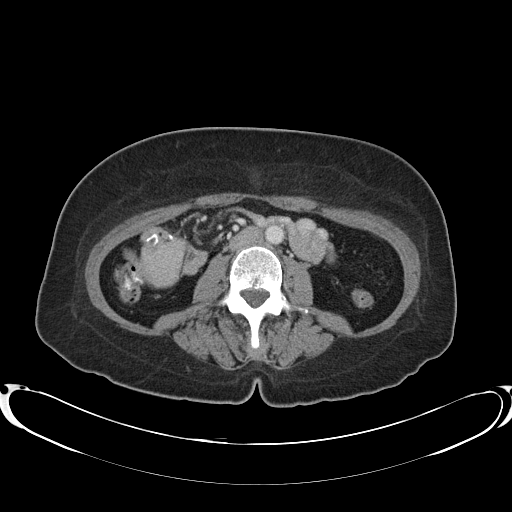
[im 65/124  soft-tissue]
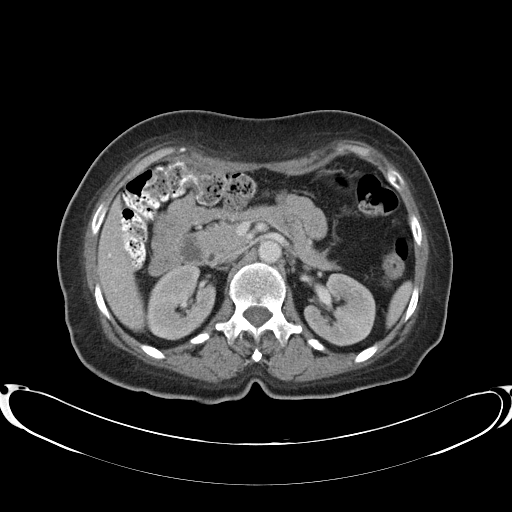
[im 72/124  soft-tissue]
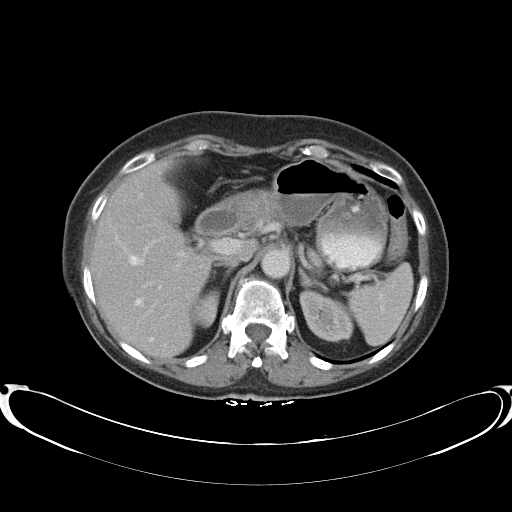
[im 78/124  soft-tissue]
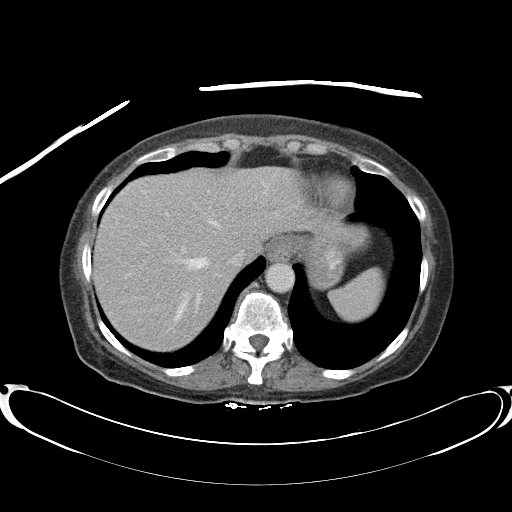
[im 78/124  bone]
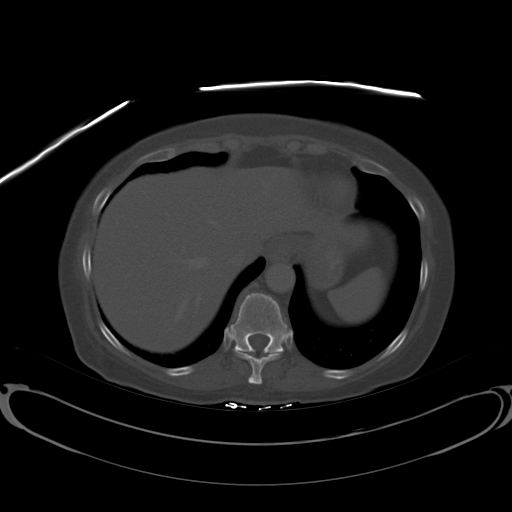
[im 91/124  soft-tissue]
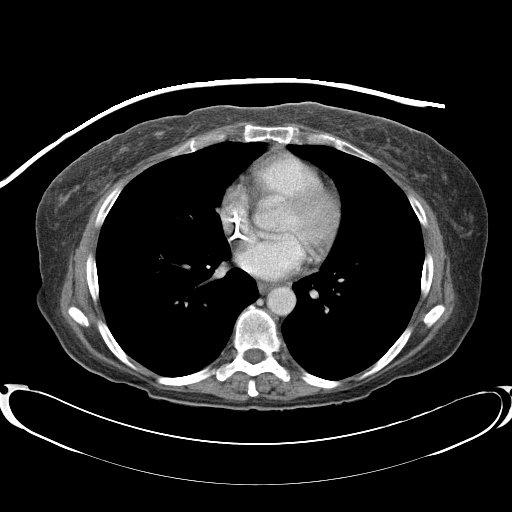
[im 98/124  soft-tissue]
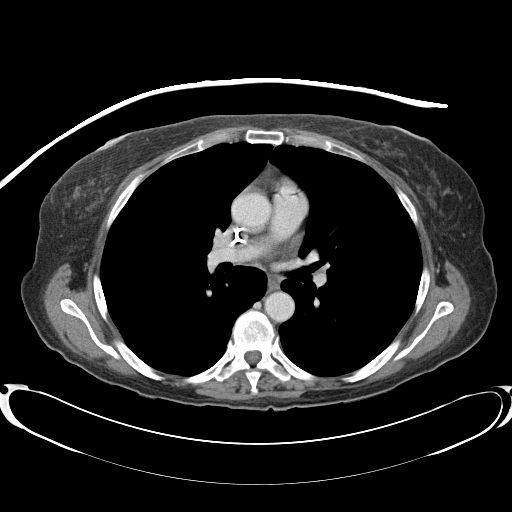
[im 104/124  soft-tissue]
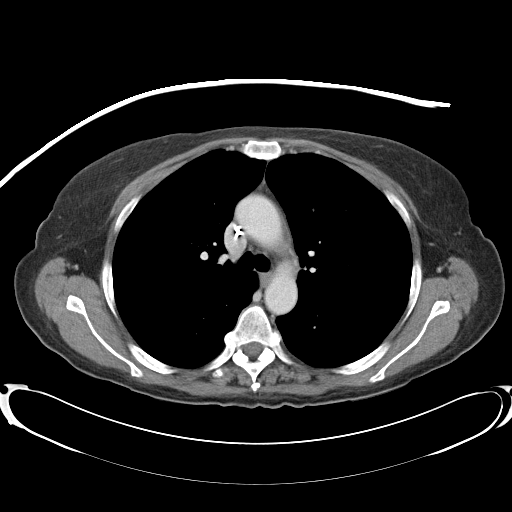
[im 117/124  soft-tissue]
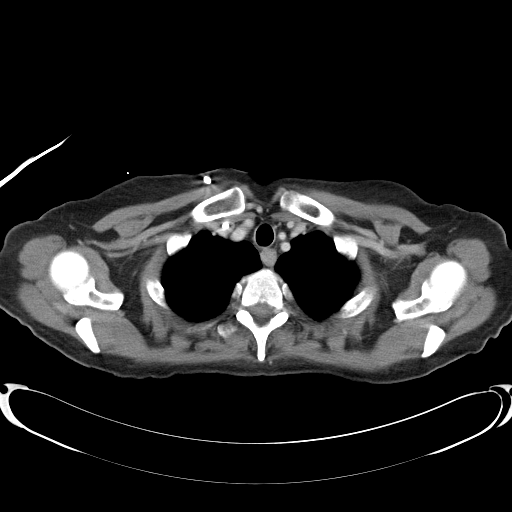

[Series 602: <mpr thick range> · coronal · 1.21mm/px · 3 of 71 slices shown]
[im 24/71  soft-tissue]
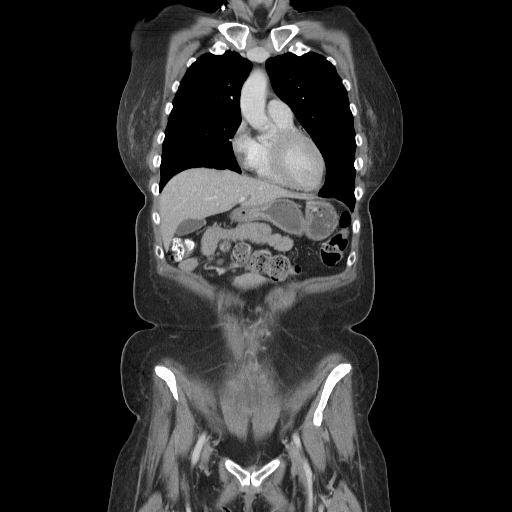
[im 32/71  soft-tissue]
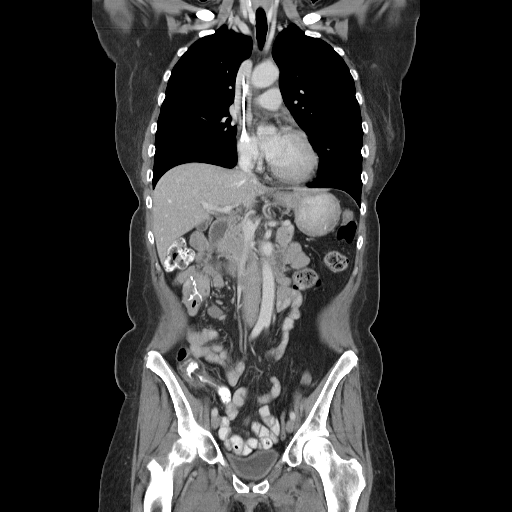
[im 39/71  soft-tissue]
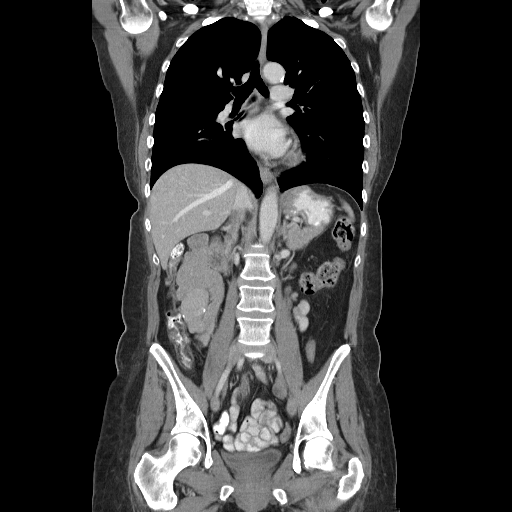

[16 of 46 positions shown; findings below may reference images not displayed]

FINDINGS: No pathologic thoracic adenopathy identified.  Right-
sided Port-A-Cath noted with tip at the cavoatrial junction.

There is some slight anterior pericardial thickening without overt
pericardial effusion.

No pleural effusion identified.  Slight nodularity along the upper
margin of the major fissures is considered incidental.
IMPRESSION: 1.  No findings of thoracic lymphoma

CT ABDOMEN AND PELVIS
FINDINGS: The region of hypodensity anteriorly in the medial
segment left hepatic lobe measures 4.7 x 1.4 cm on image 50 of
series 2 (by my measurement previously the same).  This area was
not overtly hypermetabolic on prior PET CT, and could represent
focal fatty infiltration but remains indeterminate.

The liver, spleen, pancreas, and adrenal glands remain otherwise
unremarkable.

The gallbladder and biliary system appear unremarkable.

An anastomotic staple line noted along the right abdominal small
bowel.  The previous identified obstructive wall thickening in the
small bowel is absent.  No dilated bowel noted.

A right-sided mesenteric node on image 76 of series 2 measures
x 1.6 cm.  I am uncertain whether this is a new lymph node or
whether this represents the prior central mesenteric node.

The previously noted infiltrative mass-like appearance anterior to
the mesenteric vessels below the pancreas is improved, with only
very faint infiltration slightly more eccentric to the right side
on image 64 of series 2.

A central nodal structure in the pelvis measures 4.2 cm in greatest
length, and 2.0 by 2.3 cm along the orthogonal planes.  This was
less sharply defined  on the prior exam, but measured approximately
5.2 x 3.0 by 2.0 cm.  Several other indistinct central pelvic
mesenteric lymph nodes are noted.

The kidneys appear unremarkable, as do the proximal ureters.

Left eccentric gas in the epidural space at L5-S1 is believed to be
due to a disc protrusion with nitrogen gas phenomenon.  There is
also posterior osseous ridging at this level.

Bilateral ovaries noted.
IMPRESSION: 1.  Interval resection of the small bowel mass, with mild
improvement in the adenopathy in the mesentery and pelvis.
2.  Stable lesion in the medial segment left hepatic lobe
anteriorly, indeterminate.
3.  Suspected disc protrusion at L5-S1.

## 2010-07-10 ENCOUNTER — Encounter: Payer: Self-pay | Admitting: Internal Medicine

## 2010-07-10 LAB — CBC WITH DIFFERENTIAL/PLATELET
BASO%: 0.5 % (ref 0.0–2.0)
Basophils Absolute: 0 10*3/uL (ref 0.0–0.1)
EOS%: 0.9 % (ref 0.0–7.0)
Eosinophils Absolute: 0 10*3/uL (ref 0.0–0.5)
HCT: 29.3 % — ABNORMAL LOW (ref 34.8–46.6)
HGB: 10 g/dL — ABNORMAL LOW (ref 11.6–15.9)
LYMPH%: 48.2 % (ref 14.0–49.7)
MCH: 29.9 pg (ref 25.1–34.0)
MCHC: 34 g/dL (ref 31.5–36.0)
MCV: 88.1 fL (ref 79.5–101.0)
MONO#: 0.4 10*3/uL (ref 0.1–0.9)
MONO%: 19.7 % — ABNORMAL HIGH (ref 0.0–14.0)
NEUT#: 0.7 10*3/uL — ABNORMAL LOW (ref 1.5–6.5)
NEUT%: 30.7 % — ABNORMAL LOW (ref 38.4–76.8)
Platelets: 287 10*3/uL (ref 145–400)
RBC: 3.33 10*6/uL — ABNORMAL LOW (ref 3.70–5.45)
RDW: 17.2 % — ABNORMAL HIGH (ref 11.2–14.5)
WBC: 2.2 10*3/uL — ABNORMAL LOW (ref 3.9–10.3)
lymph#: 1.1 10*3/uL (ref 0.9–3.3)

## 2010-07-10 LAB — COMPREHENSIVE METABOLIC PANEL
ALT: 10 U/L (ref 0–35)
AST: 20 U/L (ref 0–37)
Albumin: 3.7 g/dL (ref 3.5–5.2)
Alkaline Phosphatase: 59 U/L (ref 39–117)
BUN: 11 mg/dL (ref 6–23)
CO2: 26 mEq/L (ref 19–32)
Calcium: 9.9 mg/dL (ref 8.4–10.5)
Chloride: 103 mEq/L (ref 96–112)
Creatinine, Ser: 2.14 mg/dL — ABNORMAL HIGH (ref 0.40–1.20)
Glucose, Bld: 105 mg/dL — ABNORMAL HIGH (ref 70–99)
Potassium: 3.7 mEq/L (ref 3.5–5.3)
Sodium: 141 mEq/L (ref 135–145)
Total Bilirubin: 0.4 mg/dL (ref 0.3–1.2)
Total Protein: 6.1 g/dL (ref 6.0–8.3)

## 2010-07-25 NOTE — Letter (Signed)
[facility]   Imported By: Sherian Rein 02/19/2010 09:42:58  _____________________________________________________________________  External Attachment:    Type:   Image     Comment:   External Document

## 2010-07-25 NOTE — Letter (Signed)
Summary: Eli Hose MD/[facility]  Eli Hose MD/[facility]   Imported By: Lester [place] 07/10/2010 08:13:54  _____________________________________________________________________  External Attachment:    Type:   Image     Comment:   External Document

## 2010-07-25 NOTE — Assessment & Plan Note (Signed)
Summary: 1 MO ROV /[facility]   Vital Signs:  Patient profile:   65 year old female Height:      66 inches (167.64 cm) Weight:      211 pounds (95.91 kg) BMI:     34.18 O2 Sat:      98 % on Room air Temp:     98.2 degrees F (36.78 degrees C) oral Pulse rate:   80 / minute Resp:     16 per minute BP sitting:   118 / 68  (left arm) Cuff size:   regular  Vitals Entered By: Lanier Prude, CMA(AAMA) (December 28, 2009 9:42 AM)  O2 Flow:  Room air CC: 1 mo f/u Is Patient Diabetic? No   Primary Care Provider:  Tresa Garter MD  CC:  1 mo f/u.  History of Present Illness: F/u abd pain, H pilori pos, anemia Feeling much better  Current Medications (verified): 1)  Vitamin D3 1000 Unit  Tabs (Cholecalciferol) .Marland Kitchen.. 1 By Mouth Daily 2)  Diovan 320 Mg Tabs (Valsartan) .... Take 1 Tablet By Mouth Once A Day 3)  Nexium 40 Mg Cpdr (Esomeprazole Magnesium) .Marland Kitchen.. 1 By Mouth Qam ( Medically Necessary) 4)  Promethazine Hcl 25 Mg Tabs (Promethazine Hcl) .Marland Kitchen.. 1-2 By Mouth Four Times A Day As Needed Nausea 5)  Hydrocodone-Acetaminophen 5-325 Mg Tabs (Hydrocodone-Acetaminophen) .Marland Kitchen.. 1-2 By Mouth Two Times A Day As Needed Pain  Allergies (verified): No Known Drug Allergies  Past History:  Social History: Last updated: 01/24/2008 Occupation:office Retired Married with 2 children Never Smoked Alcohol use-no  Past Medical History: Hx of HEMOCCULT POSITIVE STOOL (ICD-578.1) HYPERTENSION (ICD-401.9) Hx of SINUSITIS, ACUTE (ICD-461.9) ALLERGIC RHINITIS (ICD-477.9)  H. pylori (+) Anemia-iron deficiency 2011 Hemorroids  Review of Systems  The patient denies fever, chest pain, dyspnea on exertion, abdominal pain, melena, hematochezia, and severe indigestion/heartburn.    Physical Exam  General:  overweight-appearing.   Nose:  External nasal examination shows no deformity or inflammation. Nasal mucosa are pink and moist without lesions or exudates. Mouth:  Oral mucosa and oropharynx  without lesions or exudates.  Teeth in good repair. Neck:  No deformities, masses, or tenderness noted. Lungs:  Normal respiratory effort, chest expands symmetrically. Lungs are clear to auscultation, no crackles or wheezes. Heart:  Normal rate and regular rhythm. S1 and S2 normal without gallop, murmur, click, rub or other extra sounds. Abdomen:  Bowel sounds positive,abdomen soft and non-tender without masses, organomegaly or hernias noted. Msk:  back: full range of motion of lumbar spine. Nontender to palpation. Negative straight leg raise. Deep tendon reflexes symmetrically intact at Achilles and patella, negative clonus. Sensation intact throughout all dermatomes in bilateral lower extremities. Full strength to manual muscle testing in all major muscule groups including EHL, anterior tibialis, gastrocnemius, quadriceps, and iliopsoas.  Neurologic:  No cranial nerve deficits noted. Station and gait are normal. Plantar reflexes are down-going bilaterally. DTRs are symmetrical throughout. Sensory, motor and coordinative functions appear intact. Skin:  Intact without suspicious lesions or rashes Psych:  Cognition and judgment appear intact. Alert and cooperative with normal attention span and concentration. No apparent delusions, illusions, hallucinations   Impression & Recommendations:  Problem # 1:  ABDOMINAL PAIN, EPIGASTRIC (ICD-789.06) Assessment Improved Cont w/PPI The labs/US were reviewed with the patient.   Problem # 2:  HELICOBACTER PYLORI INFECTION (ICD-041.86) Assessment: Improved Treated  Problem # 3:  ANEMIA-IRON DEFICIENCY (ICD-280.9) Assessment: New  Her updated medication list for this problem includes:    Ferro-bob  325 (65 Fe) Mg Tabs (Ferrous sulfate) .Marland Kitchen... 1 by mouth once daily for anemia  Orders: Gastroenterology Referral (GI)  Hgb: 9.0 (11/23/2009)   Hct: 28.5 (11/23/2009)   Platelets: 411.0 (11/23/2009) RBC: 4.55 (11/23/2009)   RDW: 19.6 (11/23/2009)   WBC: 5.9  (11/23/2009) MCV: 62.6 (11/23/2009)   MCHC: 31.6 (11/23/2009) TSH: 1.80 (11/23/2009)  Problem # 4:  HEMATOCHEZIA (ICD-578.1) due to #5 Assessment: Deteriorated  Orders: Gastroenterology Referral (GI)  Problem # 5:  HEMORRHOIDS (ICD-455.6) Assessment: Deteriorated Anusol supp  Complete Medication List: 1)  Vitamin D3 1000 Unit Tabs (Cholecalciferol) .Marland Kitchen.. 1 by mouth daily 2)  Diovan 320 Mg Tabs (Valsartan) .... Take 1 tablet by mouth once a day 3)  Nexium 40 Mg Cpdr (Esomeprazole magnesium) .Marland Kitchen.. 1 by mouth qam ( medically necessary) 4)  Ferro-bob 325 (65 Fe) Mg Tabs (Ferrous sulfate) .Marland Kitchen.. 1 by mouth once daily for anemia 5)  Vitamin C 100 Mg Tabs (Ascorbic acid) .Marland Kitchen.. 1 by mouth once daily  with iron 6)  Anusol-hc 25 Mg Supp (Hydrocortisone acetate) .Marland Kitchen.. 1 pr two times a day for hemorrhoids  Patient Instructions: 1)  Please schedule a follow-up appointment in 3 months. 2)  BMP prior to visit, ICD-9: 3)  CBC w/ Diff prior to visit, ICD-9: 4)  Iron/tibc  280.9 5)  Vit B12 Prescriptions: ANUSOL-HC 25 MG SUPP (HYDROCORTISONE ACETATE) 1 pr two times a day for hemorrhoids  #20 x 3   Entered and Authorized by:   Tresa Garter MD   Signed by:   Tresa Garter MD on 12/28/2009   Method used:   Print then Give to Patient   RxID:   [id number] FERRO-BOB 325 (65 FE) MG TABS (FERROUS SULFATE) 1 by mouth once daily for anemia  #30 x 6   Entered and Authorized by:   Tresa Garter MD   Signed by:   Tresa Garter MD on 12/28/2009   Method used:   Electronically to        [facility] [address] [phone]* (retail)       [address]       [place], [place]  [address]       Ph: [phone]       Fax: [phone]   RxID:   [id number]

## 2010-07-25 NOTE — Progress Notes (Signed)
Summary: HOSPITAL F/U  Phone Note Other Incoming   Caller: Pt Summary of Call: Pt called and states that the hosp told her to hold off on her Diovan until she comes in for an appt to see Dr Macario Golds. Her appt is not until Apr 16, 2010. Is this ok for her to hold off on BP medication that long. Please Advise Initial call taken by: Ami Bullins CMA,  March 06, 2010 11:31 AM  Follow-up for Phone Call        ok if BP is good ok to move up the appt Follow-up by: Tresa Garter MD,  March 06, 2010 4:50 PM  Additional Follow-up for Phone Call Additional follow up Details #1::        Pt informed, she has home bp machine and will continue to monitor.  Please help and contact patient to reschedule pt's october apt to the last week in September for hospital f/u. THANKS! Additional Follow-up by: Lamar Sprinkles, CMA,  March 06, 2010 6:06 PM    Additional Follow-up for Phone Call Additional follow up Details #2::    pt rs'd appt till 9/22 Follow-up by: Verdell Face,  March 07, 2010 9:36 AM

## 2010-07-25 NOTE — Letter (Signed)
Summary: [facility]  [facility]   Imported By: Lester [place] 03/08/2010 13:51:22  _____________________________________________________________________  External Attachment:    Type:   Image     Comment:   External Document

## 2010-07-25 NOTE — Procedures (Signed)
Summary: Colonoscopy   Colonoscopy  Procedure date:  05/07/2001  Findings:      Location:  [facility].  Results: Normal.   Procedures Next Due Date:    Colonoscopy: 04/2006 Patient Name: Christine Morales, Denise MRN:  Procedure Procedures: Colonoscopy CPT: 40981.  Personnel: Endoscopist: Wilhemina Bonito. Marina Goodell, MD.  Referred By: Linda Hedges Plotnikov, MD.  Exam Location: Exam performed in Outpatient Clinic. Outpatient  Patient Consent: Procedure, Alternatives, Risks and Benefits discussed, consent obtained, from patient.  Indications  Evaluation of: Positive fecal occult blood test per home screening.  History  Pre-Exam Physical: Performed May 07, 2001. Cardio-pulmonary exam, Rectal exam, HEENT exam , Abdominal exam, Extremity exam, Neurological exam, Mental status exam WNL.  Exam Exam: Extent of exam reached: Ileum, extent intended: Ileum.  The cecum was identified by appendiceal orifice and IC valve. Patient position: on left side. Colon retroflexion performed. Images taken. ASA Classification: I. Tolerance: excellent.  Monitoring: Pulse and BP monitoring, Oximetry used. Supplemental O2 given.  Colon Prep Used Golytely for colon prep. Prep results: excellent.  Sedation Meds: Patient assessed and found to be appropriate for moderate (conscious) sedation. Fentanyl 100 mcg. Versed 5 mg.  Findings NORMAL EXAM: Ileum to Rectum.   Assessment Normal examination.  Events  Unplanned Interventions: No intervention was required.  Unplanned Events: There were no complications. Plans Disposition: After procedure patient sent to recovery. After recovery patient sent home.  Scheduling/Referral: Colonoscopy, to Wilhemina Bonito. Marina Goodell, MD, in 5 years.,   Comments: Return to the care of Dr Posey Rea  This report was created from the original endoscopy report, which was reviewed and signed by the above listed endoscopist.   cc:  Sonda Primes, MD

## 2010-07-25 NOTE — Letter (Signed)
Summary: [facility]  [facility]   Imported By: Lennie Odor 05/07/2010 11:08:10  _____________________________________________________________________  External Attachment:    Type:   Image     Comment:   External Document

## 2010-07-25 NOTE — Assessment & Plan Note (Signed)
Summary: f/u discuss results/SD   Vital Signs:  Patient profile:   65 year old female Height:      66 inches (167.64 cm) Weight:      206 pounds (93.64 kg) BMI:     33.37 Temp:     98.1 degrees F (36.72 degrees C) oral Pulse rate:   80 / minute Pulse rhythm:   regular Resp:     16 per minute BP sitting:   138 / 82  (left arm) Cuff size:   regular  Vitals Entered By: Lanier Prude, Beverly Gust) (January 07, 2010 5:02 PM)   Primary Care Provider:  Georgina Quint Lavonda Thal MD   History of Present Illness:  Pt was d/c'd from the hosp and told to f/u with me and surgeon. Pt has called the surgeon but was told they would call her back w/an apt, she has not heard back yet. She was told to keep a liquid diet but could have limited amount of soft foods. Last bm was saturday. She has continued this and wants to know what to do next. Advised she should check w/surgeon again tomorrow if has not heard back again.   Had received call from [facility] - path revealed pelvic mass is non-hodgkin's lymphoma and she doesn't need surgery but rather oncology.    Current Medications (verified): 1)  Vitamin D3 1000 Unit  Tabs (Cholecalciferol) .Marland Kitchen.. 1 By Mouth Daily 2)  Diovan 320 Mg Tabs (Valsartan) .... Take 1 Tablet By Mouth Once A Day 3)  Nexium 40 Mg Cpdr (Esomeprazole Magnesium) .Marland Kitchen.. 1 By Mouth Qam ( Medically Necessary) 4)  Ferro-Bob 325 (65 Fe) Mg Tabs (Ferrous Sulfate) .Marland Kitchen.. 1 By Mouth Once Daily For Anemia 5)  Vitamin C 100 Mg Tabs (Ascorbic Acid) .Marland Kitchen.. 1 By Mouth Once Daily  With Iron  Allergies (verified): No Known Drug Allergies  Past History:  Past Surgical History: Last updated: 12/01/2007 * Hx of HYSTERECTOMY TUBAL LIGATION, HX OF (ICD-V26.51) REDUCTION MAMMOPLASTY, HX OF (ICD-V15.9)  Family History: Last updated: 11/23/2009 Family History Hypertension No gallstones  Social History: Last updated: 01/24/2008 Occupation:office Retired Married with 2 children Never Smoked Alcohol  use-no  Past Medical History: Hx of HEMOCCULT POSITIVE STOOL (ICD-578.1) HYPERTENSION (ICD-401.9) Hx of SINUSITIS, ACUTE (ICD-461.9) ALLERGIC RHINITIS (ICD-477.9)  H. pylori (+) Anemia-iron deficiency 2011 Hemorroids Non-Hodgkin B cell lymphoma - mesenteric 7/11 Dr Daphine Deutscher; it caused a partial SBO  Review of Systems       The patient complains of anorexia, weight loss, and abdominal pain.  The patient denies fever, chest pain, dyspnea on exertion, headaches, hemoptysis, melena, hematochezia, severe indigestion/heartburn, and difficulty walking.         No abd pain since last wk  Physical Exam  General:  overweight-appearing.   Head:  Normocephalic and atraumatic without obvious abnormalities. No apparent alopecia or balding. Eyes:  No corneal or conjunctival inflammation noted. EOMI. Perrla.  Ears:  External ear exam shows no significant lesions or deformities.  Otoscopic examination reveals clear canals, tympanic membranes are intact bilaterally without bulging, retraction, inflammation or discharge. Hearing is grossly normal bilaterally. Nose:  External nasal examination shows no deformity or inflammation. Nasal mucosa are pink and moist without lesions or exudates. Mouth:  Oral mucosa and oropharynx without lesions or exudates.  Teeth in good repair. Neck:  No deformities, masses, or tenderness noted. Lungs:  Normal respiratory effort, chest expands symmetrically. Lungs are clear to auscultation, no crackles or wheezes. Heart:  Normal rate and regular rhythm. S1 and  S2 normal without gallop, murmur, click, rub or other extra sounds. Abdomen:  Bowel sounds positive,abdomen soft and non-tender without masses, organomegaly or hernias noted. Msk:  No deformity or scoliosis noted of thoracic or lumbar spine.   Pulses:  R and L carotid,radial,femoral,dorsalis pedis and posterior tibial pulses are full and equal bilaterally Extremities:  No clubbing, cyanosis, edema, or deformity noted  with normal full range of motion of all joints.   Neurologic:  No cranial nerve deficits noted. Station and gait are normal. Plantar reflexes are down-going bilaterally. DTRs are symmetrical throughout. Sensory, motor and coordinative functions appear intact. Skin:  Intact without suspicious lesions or rashes Cervical Nodes:  No lymphadenopathy noted Inguinal Nodes:  No significant adenopathy Psych:  Cognition and judgment appear intact. Alert and cooperative with normal attention span and concentration. No apparent delusions, illusions, hallucinations not suicidal.     Impression & Recommendations:  Problem # 1:  LYMPHOMA, INTRA-ABDOMINAL (ICD-202.83) B cell Assessment New Discussed dx w/pt. Hospital stay labs/Radiol reviewed w/ptr.  Orders: Oncology Referral (Oncology)  Problem # 2:  ABDOMINAL PAIN (ICD-789.00) Assessment: Improved Small frequent soft meals Oxycod 5 mg if pai reccurs  Problem # 3:  HYPERTENSION (ICD-401.9) Assessment: Unchanged  Her updated medication list for this problem includes:    Diovan 320 Mg Tabs (Valsartan) .Marland Kitchen... Take 1 tablet by mouth once a day  BP today: 138/82 Prior BP: 118/68 (12/28/2009)  Labs Reviewed: K+: 4.3 (11/23/2009) Creat: : 0.7 (11/23/2009)     Problem # 4:  HELICOBACTER PYLORI INFECTION (ICD-041.86) Assessment: Comment Only Treated  Problem # 5:  NAUSEA (ICD-787.02) Assessment: Comment Only Resolved  Problem # 6:  ANEMIA-IRON DEFICIENCY (ICD-280.9) Assessment: Comment Only  Her updated medication list for this problem includes:    Ferro-bob 325 (65 Fe) Mg Tabs (Ferrous sulfate) .Marland Kitchen... 1 by mouth once daily for anemia  Hgb: 9.0 (11/23/2009)   Hct: 28.5 (11/23/2009)   Platelets: 411.0 (11/23/2009) RBC: 4.55 (11/23/2009)   RDW: 19.6 (11/23/2009)   WBC: 5.9 (11/23/2009) MCV: 62.6 (11/23/2009)   MCHC: 31.6 (11/23/2009) TSH: 1.80 (11/23/2009)  Complete Medication List: 1)  Vitamin D3 1000 Unit Tabs (Cholecalciferol) .Marland Kitchen.. 1 by  mouth daily 2)  Diovan 320 Mg Tabs (Valsartan) .... Take 1 tablet by mouth once a day 3)  Nexium 40 Mg Cpdr (Esomeprazole magnesium) .Marland Kitchen.. 1 by mouth qam ( medically necessary) 4)  Ferro-bob 325 (65 Fe) Mg Tabs (Ferrous sulfate) .Marland Kitchen.. 1 by mouth once daily for anemia 5)  Vitamin C 100 Mg Tabs (Ascorbic acid) .Marland Kitchen.. 1 by mouth once daily  with iron 6)  Oxycodone Hcl 5 Mg Tabs (Oxycodone hcl) .Marland Kitchen.. 1-2  by mouth two times a day as needed pain  Patient Instructions: 1)  Please schedule a follow-up appointment in 3 months. 2)  Call if you are not better in a reasonable amount of time or if worse. Go to ER if feeling really bad!  Prescriptions: OXYCODONE HCL 5 MG TABS (OXYCODONE HCL) 1-2  by mouth two times a day as needed pain  #30 x 0   Entered and Authorized by:   Tresa Garter MD   Signed by:   Tresa Garter MD on 01/07/2010   Method used:   Print then Give to Patient   RxID:   [id number]

## 2010-07-25 NOTE — Letter (Signed)
Summary: New Patient letter  [facility]  [address] [place], [place] [address]   Phone: [phone]  Fax: [phone]       12/28/2009 MRN: [id number]  Christine Morales [address] [place], [place]  [address]  Dear [name],  Welcome to the Gastroenterology Division at [facility].    You are scheduled to see Dr. Marina Goodell on 02-12-10 at 9:30a.m. on the 3rd floor at [facility], [address]  We ask that you try to arrive at our office 15 minutes prior to your appointment time to allow for check-in.  We would like you to complete the enclosed self-administered evaluation form prior to your visit and bring it with you on the day of your appointment.  We will review it with you.  Also, please bring a complete list of all your medications or, if you prefer, bring the medication bottles and we will list them.  Please bring your insurance card so that we may make a copy of it.  If your insurance requires a referral to see a specialist, please bring your referral form from your primary care physician.  Co-payments are due at the time of your visit and may be paid by cash, check or credit card.     Your office visit will consist of a consult with your physician (includes a physical exam), any laboratory testing he/she may order, scheduling of any necessary diagnostic testing (e.g. x-ray, ultrasound, CT-scan), and scheduling of a procedure (e.g. Endoscopy, Colonoscopy) if required.  Please allow enough time on your schedule to allow for any/all of these possibilities.    If you cannot keep your appointment, please call [phone] to cancel or reschedule prior to your appointment date.  This allows [place] the opportunity to schedule an appointment for another patient in need of care.  If you do not cancel or reschedule by 5 p.m. the business day prior to your appointment date, you will be charged a $50.00 late cancellation/no-show fee.    Thank you for choosing [facility] for your medical needs.  We appreciate the opportunity to care for you.  Please visit [place] at our website  to learn more about our practice.                     Sincerely,                                                             The Gastroenterology Division

## 2010-07-25 NOTE — Letter (Signed)
Summary: [facility]  [facility]   Imported By: Lennie Odor 04/23/2010 09:25:20  _____________________________________________________________________  External Attachment:    Type:   Image     Comment:   External Document

## 2010-07-25 NOTE — Progress Notes (Signed)
Summary: [facility]   Imported By: Sherian Rein 01/09/2010 09:19:03  _____________________________________________________________________  External Attachment:    Type:   Image     Comment:   External Document

## 2010-07-25 NOTE — Assessment & Plan Note (Signed)
Summary: 3 MO ROV /NWS  #   Vital Signs:  Patient profile:   65 year old female Height:      66 inches Weight:      177 pounds BMI:     28.67 Temp:     98.4 degrees F oral Pulse rate:   88 / minute Pulse rhythm:   regular Resp:     16 per minute BP sitting:   118 / 84  (left arm) Cuff size:   regular  Vitals Entered By: Lanier Prude, Beverly Gust) (March 14, 2010 9:19 AM) CC: f/u Is Patient Diabetic? No Comments pt is currently holding Diovan   Primary Care Provider:  Tresa Garter MD  CC:  f/u.  History of Present Illness: The patient presents for a follow up of hypertension, lymphoma. C/o wt loss. Off Diovan The patient presents for a post-hospital visit for  DISCHARGE DIAGNOSES 9/13: 1. Small bowel obstruction - resolved. 2. Non-Hodgkin's lymphoma - with a hypermetabolic circumferential mass-     like thickening of the distal small bowel in the pelvis consistent     with lymphoma as per January 16, 2010, PET scan - per Dr. Clelia Croft.     Patient to follow up with him at the office upon discharge. 3. Hypokalemia - resolved. 4. Hypotension - resolved. 5. History of hypertension. 6. Recent thrush. 7. History of anemia - hemoglobin stable.    Current Medications (verified): 1)  Vitamin D3 1000 Unit  Tabs (Cholecalciferol) .Marland Kitchen.. 1 By Mouth Daily 2)  Diovan 320 Mg Tabs (Valsartan) .... Take 1 Tablet By Mouth Once A Day 3)  Nexium 40 Mg Cpdr (Esomeprazole Magnesium) .Marland Kitchen.. 1 By Mouth Qam ( Medically Necessary) 4)  Ferro-Bob 325 (65 Fe) Mg Tabs (Ferrous Sulfate) .Marland Kitchen.. 1 By Mouth Once Daily For Anemia 5)  Vitamin C 100 Mg Tabs (Ascorbic Acid) .Marland Kitchen.. 1 By Mouth Once Daily  With Iron 6)  Oxycodone Hcl 5 Mg Tabs (Oxycodone Hcl) .Marland Kitchen.. 1-2  By Mouth Two Times A Day As Needed Pain  Allergies (verified): No Known Drug Allergies  Past History:  Past Medical History: Last updated: 02/06/2010 Hx of HEMOCCULT POSITIVE STOOL (ICD-578.1) HYPERTENSION (ICD-401.9) Hx of SINUSITIS,  ACUTE (ICD-461.9) ALLERGIC RHINITIS (ICD-477.9)  H. 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  The patient denies fever, syncope, dyspnea on exertion, and prolonged cough.    Physical Exam  General:  Less overweight-appearing.  NAD Mouth:  Trush present Neck:  No deformities, masses, or tenderness noted. Lungs:  Normal respiratory effort, chest expands symmetrically. Lungs are clear to auscultation, no crackles or wheezes. Heart:  Normal rate and regular rhythm. S1 and S2 normal without gallop, murmur, click, rub or other extra sounds. Abdomen:  Bowel sounds positive,abdomen soft and non-tender without masses, organomegaly or hernias noted. Msk:  No deformity or scoliosis noted of thoracic or lumbar spine.   Neurologic:  No cranial nerve deficits noted. Station and gait are normal. Plantar reflexes are down-going bilaterally. DTRs are symmetrical throughout. Sensory, motor and coordinative functions appear intact. Skin:  Intact without suspicious lesions or rashes Psych:  Cognition and judgment appear intact. Alert and cooperative with normal attention span and concentration. No apparent delusions, illusions, hallucinations not suicidal.  Impression & Recommendations:  Problem # 1:  LYMPHOMA, INTRA-ABDOMINAL (ICD-202.83) Assessment Comment Only on chemo  Problem # 2:  ABDOMINAL PAIN (ICD-789.00) Assessment: Improved Hosp records reviewed  Problem # 3:  HYPERTENSION (ICD-401.9) Assessment: Improved  Her updated medication list for this problem includes:    Diovan 320 Mg Tabs (Valsartan) .Marland Kitchen... Take 1 tablet by mouth once a day - HELD  Problem # 4:  THRUSH  (ICD-112.0) Assessment: New Nystatin  Problem # 5:  WEIGHT LOSS (ICD-783.21) Assessment: New  Complete Medication List: 1)  Diovan 320 Mg Tabs (Valsartan) .... Take 1 tablet by mouth once a day 2)  Nexium 40 Mg Cpdr (Esomeprazole magnesium) .Marland Kitchen.. 1 by mouth qam ( medically necessary) 3)  Ferro-bob 325 (65 Fe) Mg Tabs (Ferrous sulfate) .Marland Kitchen.. 1 by mouth once daily for anemia 4)  Vitamin C 100 Mg Tabs (Ascorbic acid) .Marland Kitchen.. 1 by mouth once daily  with iron 5)  Oxycodone Hcl 5 Mg Tabs (Oxycodone hcl) .Marland Kitchen.. 1-2  by mouth two times a day as needed pain 6)  Nystatin 100000 Unit/ml Susp (Nystatin) .... 5 ml by mouth swish, gargle and swallow qid for thrush 7)  Vitamin D3 1000 Unit Tabs (Cholecalciferol) .Marland Kitchen.. 1 by mouth daily  Patient Instructions: 1)  Try [place] yogurt with honey; Kefir 2)  Smoothies 3)  Please schedule a follow-up appointment in 2 months. 4)  BMP prior to visit, ICD-9: 5)  Hepatic Panel prior to visit, ICD-9:995.20 202.83 6)  TSH prior to visit, ICD-9: 7)  CBC w/ Diff prior to visit, ICD-9: 8)  Urine-dip prior to visit, ICD-9: Prescriptions: NYSTATIN 100000 UNIT/ML SUSP (NYSTATIN) 5 ml by mouth swish, gargle and swallow qid for thrush  #300 ml x 1   Entered and Authorized by:   Tresa Garter MD   Signed by:   Tresa Garter MD on 03/14/2010   Method used:   Electronically to        [phone]* (retail)       [address]       [place], [place]  [address]       Ph: [phone]       Fax: [phone]   RxID:   [id number]

## 2010-07-25 NOTE — Assessment & Plan Note (Signed)
Summary: stomach pain-oyu   Vital Signs:  Patient profile:   65 year old female Height:      66 inches Weight:      216.25 pounds BMI:     35.03 O2 Sat:      98 % on Room air Temp:     97.3 degrees F oral Pulse rate:   80 / minute BP sitting:   134 / 72  (left arm) Cuff size:   large  Vitals Entered By: Lucious Groves (November 23, 2009 1:13 PM)  O2 Flow:  Room air CC: C/O abd pain on/off x2 mo. Pt notes that it is related to food./kb Is Patient Diabetic? No Pain Assessment Patient in pain? yes     Location: abdomen Intensity: 7 Onset of pain  2 months ago Comments Patient also notes some N&V, but denies rectal bleeding, blood in stool, and fever./kb   Primary Care Provider:  Georgina Quint Danil Wedge MD  CC:  C/O abd pain on/off x2 mo. Pt notes that it is related to food./kb.  History of Present Illness: C/o abd pain in the center off and on x 2 months depending on diet - worse w/fried food. It has beed happening q 1-2 wks; it would last x 4 hrs. C/o nausea, no diarrhea  Current Medications (verified): 1)  Calcium 600 600 Mg  Tabs (Calcium Carbonate) .... Take Once Daily 2)  Vitamin D3 1000 Unit  Tabs (Cholecalciferol) .Marland Kitchen.. 1 By Mouth Daily 3)  Diovan 320 Mg Tabs (Valsartan) .... 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   Nose:  External nasal  examination shows no deformity or inflammation. Nasal mucosa are pink and moist without lesions or exudates. Mouth:  Oral mucosa and oropharynx without lesions or exudates.  Teeth in good repair. Neck:  No deformities, masses, or tenderness noted. Lungs:  Normal respiratory effort, chest expands symmetrically. Lungs are clear to auscultation, no crackles or wheezes. Heart:  Normal rate and regular rhythm. S1 and S2 normal without gallop, murmur, click, rub or other extra sounds. Abdomen:  Bowel sounds positive,abdomen soft and non-tender without masses, organomegaly or hernias noted. Msk:  back: full range of motion of lumbar spine. Nontender to palpation. Negative straight leg raise. Deep tendon reflexes symmetrically intact at Achilles and patella, negative clonus. Sensation intact throughout all dermatomes in bilateral lower extremities. Full strength to manual muscle testing in all major muscule groups including EHL, anterior tibialis, gastrocnemius, quadriceps, and iliopsoas.  Skin:  Intact without suspicious lesions or rashes   Impression & Recommendations:  Problem # 1:  ABDOMINAL PAIN, EPIGASTRIC (ICD-789.06) Assessment New  Orders: TLB-BMP (Basic Metabolic Panel-BMET) (80048-METABOL) TLB-CBC Platelet - w/Differential (85025-CBCD) TLB-Hepatic/Liver Function Pnl (80076-HEPATIC) TLB-Lipase (83690-LIPASE) TLB-Sedimentation Rate (ESR) (85652-ESR) TLB-TSH (Thyroid Stimulating Hormone) (84443-TSH) TLB-Udip ONLY (81003-UDIP) TLB-H. Pylori Abs(Helicobacter Pylori) (86677-HELICO) Radiology Referral (Radiology)  Problem # 2:  NAUSEA (ICD-787.02)  Orders: TLB-BMP (Basic Metabolic Panel-BMET) (80048-METABOL) TLB-CBC Platelet - w/Differential (85025-CBCD) TLB-Hepatic/Liver Function Pnl (80076-HEPATIC) TLB-Lipase (83690-LIPASE) TLB-Sedimentation Rate (  ESR) (85652-ESR) TLB-TSH (Thyroid Stimulating Hormone) (84443-TSH) TLB-Udip ONLY (81003-UDIP) Radiology Referral (Radiology)  Problem #  3:  HELICOBACTER PYLORI INFECTION (ICD-041.86) Assessment: New  Complete Medication List: 1)  Vitamin D3 1000 Unit Tabs (Cholecalciferol) .Marland Kitchen.. 1 by mouth daily 2)  Diovan 320 Mg Tabs (Valsartan) .... Take 1 tablet by mouth once a day 3)  Nexium 40 Mg Cpdr (Esomeprazole magnesium) .Marland Kitchen.. 1 by mouth qam ( medically necessary) 4)  Promethazine Hcl 25 Mg Tabs (Promethazine hcl) .Marland Kitchen.. 1-2 by mouth four times a day as needed nausea 5)  Hydrocodone-acetaminophen 5-325 Mg Tabs (Hydrocodone-acetaminophen) .Marland Kitchen.. 1-2 by mouth two times a day as needed pain 6)  Prevpac Misc (Amoxicill-clarithro-lansopraz) .... As dirr. two times a day  Patient Instructions: 1)  Call if you are not better in a reasonable amount of time or if worse. Go to ER if feeling really bad!  2)  Low fat diet 3)  Please schedule a follow-up appointment in 1 month. Prescriptions: PREVPAC  MISC (AMOXICILL-CLARITHRO-LANSOPRAZ) as dirr. two times a day  #10 x 0   Entered and Authorized by:   Tresa Garter MD   Signed by:   Tresa Garter MD on 11/24/2009   Method used:   Print then Give to Patient   RxID:   [id number] HYDROCODONE-ACETAMINOPHEN 5-325 MG TABS (HYDROCODONE-ACETAMINOPHEN) 1-2 by mouth two times a day as needed pain  #20 x 1   Entered and Authorized by:   Tresa Garter MD   Signed by:   Tresa Garter MD on 11/23/2009   Method used:   Print then Give to Patient   RxID:   [id number] PROMETHAZINE HCL 25 MG TABS (PROMETHAZINE HCL) 1-2 by mouth four times a day as needed nausea  #60 x 1   Entered and Authorized by:   Tresa Garter MD   Signed by:   Tresa Garter MD on 11/23/2009   Method used:   Print then Give to Patient   RxID:   [id number] NEXIUM 40 MG CPDR (ESOMEPRAZOLE MAGNESIUM) 1 by mouth qam ( medically necessary)  #30 x 12   Entered and Authorized by:   Tresa Garter MD   Signed by:   Tresa Garter MD on 11/23/2009   Method used:   Print then Give to  Patient   RxID:   [id number]

## 2010-07-25 NOTE — Letter (Signed)
Summary: [facility]  [facility]   Imported By: Sherian Rein 05/27/2010 09:34:16  _____________________________________________________________________  External Attachment:    Type:   Image     Comment:   External Document

## 2010-07-25 NOTE — Progress Notes (Signed)
Summary: [facility] PT/ Surgeon  Phone Note Call from Patient Call back at Home Phone [phone]   Summary of Call: Pt was d/c'd from the hosp and told to f/u with Dr Posey Rea and surgeon. Pt has called the surgeon but was told they would call her back w/an apt, she has not heard back yet. She was told to keep a liquid diet but could have limited amount of soft foods. Last bm was saturday. She has continued this and wants to know what to do next. Advised she should check w/surgeon again tomorrow if has not heard back again. Pt did have concerns b/c she did not know this surgeon and wanted reccomendation from our office. Hospital has pt set up to see Dr Luretha Murphy.   Pt is not in any pain.  Initial call taken by: Lamar Sprinkles, CMA,  January 03, 2010 4:12 PM  Follow-up for Phone Call        Had received call from [facility] - path revealed pelvic mass is non-hodgkin's lymphoma and she doesn't need surgery but rather oncology.   I spoke with her: told her that the surgeon did not think she needed surgery. Did not give her the diagnosis. She needs to see Dr. Posey Rea next week to go over the path/diagnosis and be referred.  Follow-up by: Jacques Navy MD,  January 03, 2010 5:40 PM  Additional Follow-up for Phone Call Additional follow up Details #1::        Pt scheduled for end of day monday. Additional Follow-up by: Lamar Sprinkles, CMA,  January 04, 2010 9:21 AM    Additional Follow-up for Phone Call Additional follow up Details #2::    Agree. Noted. Thx Follow-up by: Tresa Garter MD,  January 04, 2010 9:01 AM

## 2010-07-25 NOTE — Procedures (Signed)
Summary: Colonoscopy/[facility]  Colonoscopy/[facility]   Imported By: Sherian Rein 01/09/2010 09:17:03  _____________________________________________________________________  External Attachment:    Type:   Image     Comment:   External Document

## 2010-08-08 NOTE — Letter (Signed)
Summary: [facility]  [facility]   Imported By: Lennie Odor 07/30/2010 11:33:23  _____________________________________________________________________  External Attachment:    Type:   Image     Comment:   External Document

## 2010-08-14 ENCOUNTER — Encounter: Payer: Self-pay | Admitting: Internal Medicine

## 2010-08-14 ENCOUNTER — Encounter (HOSPITAL_BASED_OUTPATIENT_CLINIC_OR_DEPARTMENT_OTHER): Payer: BC Managed Care – PPO | Admitting: Oncology

## 2010-08-14 ENCOUNTER — Other Ambulatory Visit: Payer: Self-pay | Admitting: Oncology

## 2010-08-14 DIAGNOSIS — D702 Other drug-induced agranulocytosis: Secondary | ICD-10-CM

## 2010-08-14 DIAGNOSIS — D72819 Decreased white blood cell count, unspecified: Secondary | ICD-10-CM

## 2010-08-14 DIAGNOSIS — T451X5A Adverse effect of antineoplastic and immunosuppressive drugs, initial encounter: Secondary | ICD-10-CM

## 2010-08-14 DIAGNOSIS — C8590 Non-Hodgkin lymphoma, unspecified, unspecified site: Secondary | ICD-10-CM

## 2010-08-14 DIAGNOSIS — C8588 Other specified types of non-Hodgkin lymphoma, lymph nodes of multiple sites: Secondary | ICD-10-CM

## 2010-08-14 LAB — CBC WITH DIFFERENTIAL/PLATELET
BASO%: 0.2 % (ref 0.0–2.0)
Basophils Absolute: 0 10*3/uL (ref 0.0–0.1)
EOS%: 2.9 % (ref 0.0–7.0)
HGB: 9.1 g/dL — ABNORMAL LOW (ref 11.6–15.9)
MCH: 30.3 pg (ref 25.1–34.0)
MCHC: 33.9 g/dL (ref 31.5–36.0)
RBC: 3 10*6/uL — ABNORMAL LOW (ref 3.70–5.45)
RDW: 15.8 % — ABNORMAL HIGH (ref 11.2–14.5)
lymph#: 0.8 10*3/uL — ABNORMAL LOW (ref 0.9–3.3)

## 2010-08-14 LAB — COMPREHENSIVE METABOLIC PANEL
ALT: 11 U/L (ref 0–35)
AST: 19 U/L (ref 0–37)
Albumin: 3.6 g/dL (ref 3.5–5.2)
Calcium: 9.2 mg/dL (ref 8.4–10.5)
Chloride: 106 mEq/L (ref 96–112)
Potassium: 4.1 mEq/L (ref 3.5–5.3)
Sodium: 142 mEq/L (ref 135–145)
Total Protein: 5.7 g/dL — ABNORMAL LOW (ref 6.0–8.3)

## 2010-08-23 ENCOUNTER — Ambulatory Visit (INDEPENDENT_AMBULATORY_CARE_PROVIDER_SITE_OTHER): Payer: BC Managed Care – PPO | Admitting: Internal Medicine

## 2010-08-23 ENCOUNTER — Encounter: Payer: Self-pay | Admitting: Internal Medicine

## 2010-08-23 DIAGNOSIS — I10 Essential (primary) hypertension: Secondary | ICD-10-CM

## 2010-08-23 DIAGNOSIS — R634 Abnormal weight loss: Secondary | ICD-10-CM

## 2010-08-23 DIAGNOSIS — C8583 Other specified types of non-Hodgkin lymphoma, intra-abdominal lymph nodes: Secondary | ICD-10-CM

## 2010-09-03 LAB — COMPREHENSIVE METABOLIC PANEL
ALT: 20 U/L (ref 0–35)
ALT: 57 U/L — ABNORMAL HIGH (ref 0–35)
AST: 16 U/L (ref 0–37)
AST: 60 U/L — ABNORMAL HIGH (ref 0–37)
Albumin: 2.2 g/dL — ABNORMAL LOW (ref 3.5–5.2)
Alkaline Phosphatase: 64 U/L (ref 39–117)
Alkaline Phosphatase: 64 U/L (ref 39–117)
CO2: 26 mEq/L (ref 19–32)
CO2: 30 mEq/L (ref 19–32)
Chloride: 112 mEq/L (ref 96–112)
GFR calc Af Amer: 49 mL/min — ABNORMAL LOW (ref >60)
GFR calc Af Amer: >60 mL/min (ref >60)
GFR calc non Af Amer: 40 mL/min — ABNORMAL LOW (ref >60)
Glucose, Bld: 102 mg/dL — ABNORMAL HIGH (ref 70–99)
Potassium: 3 mEq/L — ABNORMAL LOW (ref 3.5–5.1)
Potassium: 3.8 mEq/L (ref 3.5–5.1)
Sodium: 142 mEq/L (ref 135–145)
Total Bilirubin: 0.7 mg/dL (ref 0.3–1.2)

## 2010-09-03 LAB — BASIC METABOLIC PANEL
BUN: 3 mg/dL — ABNORMAL LOW (ref 6–23)
BUN: 9 mg/dL (ref 6–23)
BUN: 9 mg/dL (ref 6–23)
CO2: 26 mEq/L (ref 19–32)
CO2: 27 mEq/L (ref 19–32)
Calcium: 7.6 mg/dL — ABNORMAL LOW (ref 8.4–10.5)
Calcium: 8 mg/dL — ABNORMAL LOW (ref 8.4–10.5)
Calcium: 8.1 mg/dL — ABNORMAL LOW (ref 8.4–10.5)
Calcium: 8.5 mg/dL (ref 8.4–10.5)
Chloride: 106 mEq/L (ref 96–112)
Creatinine, Ser: 0.92 mg/dL (ref 0.4–1.2)
Creatinine, Ser: 1.02 mg/dL (ref 0.4–1.2)
Creatinine, Ser: 1.03 mg/dL (ref 0.4–1.2)
Creatinine, Ser: 1.11 mg/dL (ref 0.4–1.2)
Creatinine, Ser: 1.15 mg/dL (ref 0.4–1.2)
GFR calc Af Amer: >60 mL/min (ref >60)
GFR calc Af Amer: >60 mL/min (ref >60)
GFR calc non Af Amer: 50 mL/min — ABNORMAL LOW (ref >60)
Glucose, Bld: 103 mg/dL — ABNORMAL HIGH (ref 70–99)
Glucose, Bld: 145 mg/dL — ABNORMAL HIGH (ref 70–99)
Glucose, Bld: 89 mg/dL (ref 70–99)
Glucose, Bld: 98 mg/dL (ref 70–99)
Potassium: 4.2 mEq/L (ref 3.5–5.1)

## 2010-09-03 LAB — DIFFERENTIAL
Basophils Absolute: 0 10*3/uL (ref 0.0–0.1)
Basophils Absolute: 0 10*3/uL (ref 0.0–0.1)
Basophils Absolute: 0 10*3/uL (ref 0.0–0.1)
Basophils Absolute: 0 10*3/uL (ref 0.0–0.1)
Basophils Absolute: 0 10*3/uL (ref 0.0–0.1)
Basophils Relative: 0 % (ref 0–1)
Basophils Relative: 0 % (ref 0–1)
Basophils Relative: 0 % (ref 0–1)
Basophils Relative: 0 % (ref 0–1)
Basophils Relative: 0 % (ref 0–1)
Eosinophils Absolute: 0 10*3/uL (ref 0.0–0.7)
Eosinophils Absolute: 0 10*3/uL (ref 0.0–0.7)
Eosinophils Absolute: 0 10*3/uL (ref 0.0–0.7)
Eosinophils Absolute: 0 10*3/uL (ref 0.0–0.7)
Eosinophils Absolute: 0.1 10*3/uL (ref 0.0–0.7)
Eosinophils Absolute: 0.1 10*3/uL (ref 0.0–0.7)
Eosinophils Relative: 2 % (ref 0–5)
Eosinophils Relative: 3 % (ref 0–5)
Lymphocytes Relative: 13 % (ref 12–46)
Lymphocytes Relative: 15 % (ref 12–46)
Lymphocytes Relative: 22 % (ref 12–46)
Lymphocytes Relative: 24 % (ref 12–46)
Lymphocytes Relative: 27 % (ref 12–46)
Lymphocytes Relative: 6 % — ABNORMAL LOW (ref 12–46)
Lymphs Abs: 0.1 10*3/uL — ABNORMAL LOW (ref 0.7–4.0)
Lymphs Abs: 0.4 10*3/uL — ABNORMAL LOW (ref 0.7–4.0)
Lymphs Abs: 0.4 10*3/uL — ABNORMAL LOW (ref 0.7–4.0)
Lymphs Abs: 0.6 10*3/uL — ABNORMAL LOW (ref 0.7–4.0)
Lymphs Abs: 0.6 10*3/uL — ABNORMAL LOW (ref 0.7–4.0)
Lymphs Abs: 0.6 10*3/uL — ABNORMAL LOW (ref 0.7–4.0)
Monocytes Absolute: 0.1 10*3/uL (ref 0.1–1.0)
Monocytes Absolute: 0.2 10*3/uL (ref 0.1–1.0)
Monocytes Relative: 15 % — ABNORMAL HIGH (ref 3–12)
Monocytes Relative: 16 % — ABNORMAL HIGH (ref 3–12)
Monocytes Relative: 19 % — ABNORMAL HIGH (ref 3–12)
Monocytes Relative: 7 % (ref 3–12)
Monocytes Relative: 9 % (ref 3–12)
Neutro Abs: 1.7 10*3/uL (ref 1.7–7.7)
Neutro Abs: 2.1 10*3/uL (ref 1.7–7.7)
Neutro Abs: 4 10*3/uL (ref 1.7–7.7)
Neutrophils Relative %: 60 % (ref 43–77)
Neutrophils Relative %: 61 % (ref 43–77)
Neutrophils Relative %: 73 % (ref 43–77)
Neutrophils Relative %: 74 % (ref 43–77)
Neutrophils Relative %: 76 % (ref 43–77)
Neutrophils Relative %: 80 % — ABNORMAL HIGH (ref 43–77)
Neutrophils Relative %: 80 % — ABNORMAL HIGH (ref 43–77)

## 2010-09-03 LAB — CBC
HCT: 26.2 % — ABNORMAL LOW (ref 36.0–46.0)
HCT: 28.9 % — ABNORMAL LOW (ref 36.0–46.0)
HCT: 30.4 % — ABNORMAL LOW (ref 36.0–46.0)
HCT: 32.8 % — ABNORMAL LOW (ref 36.0–46.0)
Hemoglobin: 8.7 g/dL — ABNORMAL LOW (ref 12.0–15.0)
Hemoglobin: 8.8 g/dL — ABNORMAL LOW (ref 12.0–15.0)
Hemoglobin: 8.9 g/dL — ABNORMAL LOW (ref 12.0–15.0)
Hemoglobin: 9.8 g/dL — ABNORMAL LOW (ref 12.0–15.0)
MCH: 28.1 pg (ref 26.0–34.0)
MCH: 28.6 pg (ref 26.0–34.0)
MCH: 28.6 pg (ref 26.0–34.0)
MCH: 28.8 pg (ref 26.0–34.0)
MCH: 28.9 pg (ref 26.0–34.0)
MCHC: 33.9 g/dL (ref 30.0–36.0)
MCHC: 34.6 g/dL (ref 30.0–36.0)
MCHC: 34.7 g/dL (ref 30.0–36.0)
MCHC: 34.9 g/dL (ref 30.0–36.0)
MCHC: 35.4 g/dL (ref 30.0–36.0)
MCV: 82.2 fL (ref 78.0–100.0)
MCV: 82.3 fL (ref 78.0–100.0)
MCV: 82.8 fL (ref 78.0–100.0)
MCV: 83.7 fL (ref 78.0–100.0)
Platelets: 173 10*3/uL (ref 150–400)
Platelets: 184 10*3/uL (ref 150–400)
Platelets: 190 10*3/uL (ref 150–400)
Platelets: 201 10*3/uL (ref 150–400)
Platelets: 212 10*3/uL (ref 150–400)
Platelets: 228 10*3/uL (ref 150–400)
Platelets: 229 10*3/uL (ref 150–400)
Platelets: 242 10*3/uL (ref 150–400)
RBC: 3.03 MIL/uL — ABNORMAL LOW (ref 3.87–5.11)
RBC: 3.06 MIL/uL — ABNORMAL LOW (ref 3.87–5.11)
RBC: 3.07 MIL/uL — ABNORMAL LOW (ref 3.87–5.11)
RBC: 3.13 MIL/uL — ABNORMAL LOW (ref 3.87–5.11)
RBC: 3.32 MIL/uL — ABNORMAL LOW (ref 3.87–5.11)
RBC: 3.36 MIL/uL — ABNORMAL LOW (ref 3.87–5.11)
RBC: 3.52 MIL/uL — ABNORMAL LOW (ref 3.87–5.11)
RBC: 3.99 MIL/uL (ref 3.87–5.11)
RDW: 17.2 % — ABNORMAL HIGH (ref 11.5–15.5)
RDW: 17.2 % — ABNORMAL HIGH (ref 11.5–15.5)
RDW: 17.4 % — ABNORMAL HIGH (ref 11.5–15.5)
RDW: 17.5 % — ABNORMAL HIGH (ref 11.5–15.5)
RDW: 17.6 % — ABNORMAL HIGH (ref 11.5–15.5)
RDW: 17.6 % — ABNORMAL HIGH (ref 11.5–15.5)
WBC: 1.1 10*3/uL — CL (ref 4.0–10.5)
WBC: 1.5 10*3/uL — ABNORMAL LOW (ref 4.0–10.5)
WBC: 2.1 10*3/uL — ABNORMAL LOW (ref 4.0–10.5)
WBC: 2.6 10*3/uL — ABNORMAL LOW (ref 4.0–10.5)
WBC: 2.8 10*3/uL — ABNORMAL LOW (ref 4.0–10.5)
WBC: 3 10*3/uL — ABNORMAL LOW (ref 4.0–10.5)
WBC: 9.5 10*3/uL (ref 4.0–10.5)

## 2010-09-03 LAB — LIPASE, BLOOD: Lipase: 28 U/L (ref 11–59)

## 2010-09-03 LAB — MAGNESIUM
Magnesium: 1.7 mg/dL (ref 1.5–2.5)
Magnesium: 2 mg/dL (ref 1.5–2.5)

## 2010-09-03 LAB — URINALYSIS, ROUTINE W REFLEX MICROSCOPIC
Bilirubin Urine: NEGATIVE
Glucose, UA: NEGATIVE mg/dL
Hgb urine dipstick: NEGATIVE
Protein, ur: NEGATIVE mg/dL
Urobilinogen, UA: 0.2 mg/dL (ref 0.0–1.0)

## 2010-09-03 NOTE — Assessment & Plan Note (Signed)
Summary: EVAL BLOOD PRESSURE/CD   Vital Signs:  Patient profile:   65 year old female Height:      66 inches Weight:      144 pounds BMI:     23.33 Temp:     97.3 degrees F oral Pulse rate:   92 / minute Pulse rhythm:   regular Resp:     16 per minute BP sitting:   150 / 94  (left arm) Cuff size:   regular  Vitals Entered By: Lanier Prude, Beverly Gust) (August 23, 2010 2:40 PM) CC: elevated blood pressure Comments pt states she is not currently taking any meds   Primary Care Provider:  Tresa Garter MD  CC:  elevated blood pressure.  History of Present Illness: The patient presents for a follow up of hypertension, wt loss and lymphoma  Current Medications (verified): 1)  Diovan 320 Mg Tabs (Valsartan) .... Take 1 Tablet By Mouth Once A Day 2)  Nexium 40 Mg Cpdr (Esomeprazole Magnesium) .Marland Kitchen.. 1 By Mouth Qam ( Medically Necessary) 3)  Ferro-Bob 325 (65 Fe) Mg Tabs (Ferrous Sulfate) .Marland Kitchen.. 1 By Mouth Once Daily For Anemia 4)  Vitamin C 100 Mg Tabs (Ascorbic Acid) .Marland Kitchen.. 1 By Mouth Once Daily  With Iron 5)  Oxycodone Hcl 5 Mg Tabs (Oxycodone Hcl) .Marland Kitchen.. 1-2  By Mouth Two Times A Day As Needed Pain 6)  Vitamin D3 1000 Unit  Tabs (Cholecalciferol) .Marland Kitchen.. 1 By Mouth Daily  Allergies (verified): No Known Drug Allergies  Past History:  Past Medical History: Last updated: 02/06/2010 Hx of HEMOCCULT POSITIVE STOOL (ICD-578.1) HYPERTENSION (ICD-401.9) Hx of SINUSITIS, ACUTE (ICD-461.9) ALLERGIC RHINITIS (ICD-477.9)  H. pylori (+) Anemia-iron deficiency 2011 Hemorroids Non-Hodgkin B cell lymphoma - mesenteric 7/11 Dr Daphine Deutscher; it caused a partial SBO Peptic Ulcer Disease  Social History: Last updated: 01/24/2008 Occupation:office Retired Married with 2 children Never Smoked Alcohol use-no  Past Surgical History: * Hx of HYSTERECTOMY TUBAL LIGATION, HX OF (ICD-V26.51) REDUCTION MAMMOPLASTY, HX OF (ICD-V15.9) Colectomy - partial colectomy Dr Rayburn Ma  Review of  Systems       The patient complains of weight loss.  The patient denies anorexia, fever, chest pain, dyspnea on exertion, peripheral edema, and abdominal pain.    Physical Exam  General:  Not overweight-appearing.  NAD Eyes:  No corneal or conjunctival inflammation noted. EOMI. Perrla.  Nose:  External nasal examination shows no deformity or inflammation. Nasal mucosa are pink and moist without lesions or exudates. Mouth:  Clear Lungs:  Normal respiratory effort, chest expands symmetrically. Lungs are clear to auscultation, no crackles or wheezes. Heart:  Normal rate and regular rhythm. S1 and S2 normal without gallop, murmur, click, rub or other extra sounds. Abdomen:  Bowel sounds positive,abdomen soft and non-tender without masses, organomegaly or hernias noted. Msk:  No deformity or scoliosis noted of thoracic or lumbar spine.   Extremities:  No clubbing, cyanosis, edema, or deformity noted with normal full range of motion of all joints.   Neurologic:  No cranial nerve deficits noted. Station and gait are normal. Plantar reflexes are down-going bilaterally. DTRs are symmetrical throughout. Sensory, motor and coordinative functions appear intact. Skin:  Intact without suspicious lesions or rashes Psych:  Cognition and judgment appear intact. Alert and cooperative with normal attention span and concentration. No apparent delusions, illusions, hallucinations   Impression & Recommendations:  Problem # 1:  WEIGHT LOSS (ICD-783.21) due to #2 Assessment Improved See "Patient Instructions".   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 1 by mouth once daily for anemia  Hgb: 9.0 (11/23/2009)   Hct: 28.5 (11/23/2009)   Platelets: 411.0 (11/23/2009) RBC:  4.55 (11/23/2009)   RDW: 19.6 (11/23/2009)   WBC: 5.9 (11/23/2009) MCV: 62.6 (11/23/2009)   MCHC: 31.6 (11/23/2009) TSH: 1.80 (11/23/2009)  Problem # 4:  HYPERTENSION (ICD-401.9) Assessment: Deteriorated Call if BP is up The following medications were removed from the medication list:    Diovan 320 Mg Tabs (Valsartan) .Marland Kitchen... Take 1 tablet by mouth once a day Her updated medication list for this problem includes:    Losartan Potassium 100 Mg Tabs (Losartan potassium) .Marland Kitchen... 1 by mouth once daily for blood pressure  BP today: 150/94 Prior BP: 118/84 (03/14/2010)  Labs Reviewed: K+: 4.3 (11/23/2009) Creat: : 0.7 (11/23/2009)     Complete Medication List: 1)  Ferro-bob 325 (65 Fe) Mg Tabs (Ferrous sulfate) .Marland Kitchen.. 1 by mouth once daily for anemia 2)  Vitamin D3 1000 Unit Tabs (Cholecalciferol) .Marland Kitchen.. 1 by mouth daily 3)  Losartan Potassium 100 Mg Tabs (Losartan potassium) .Marland Kitchen.. 1 by mouth once daily for blood pressure  Patient Instructions: 1)  Please schedule a follow-up appointment in 3-4 months well w/labs. 2)  BMP prior to visit, ICD-9: v70.0 3)  Hepatic Panel prior to visit, ICD-9: 4)  CBC w/ Diff prior to visit, ICD-9: 5)  Urine-dip prior to visit, ICD-9: 6)  Vit B12 995.20 7)  Vit D 733.90 8)  Hepatic Panel prior to visit, ICD-9: 9)  Lipid Panel prior to visit, ICD-9: 10)  TSH prior to visit, ICD-9: Prescriptions: VITAMIN D3 1000 UNIT  TABS (CHOLECALCIFEROL) 1 by mouth daily  #100 x 3   Entered and Authorized by:   Tresa Garter MD   Signed by:   Tresa Garter MD on 08/23/2010   Method used:   Print then Give to Patient   RxID:   [id number] LOSARTAN POTASSIUM 100 MG TABS (LOSARTAN POTASSIUM) 1 by mouth once daily for blood pressure  #30 x 12   Entered and Authorized by:   Tresa Garter MD   Signed by:   Tresa Garter MD on 08/23/2010   Method used:   Print then Give to Patient   RxID:   [id number]    Orders Added: 1)  Est. Patient Level  IV [[id number]]

## 2010-09-04 LAB — DIFFERENTIAL
Basophils Absolute: 0 10*3/uL (ref 0.0–0.1)
Eosinophils Relative: 1 % (ref 0–5)
Lymphocytes Relative: 28 % (ref 12–46)
Lymphs Abs: 1.4 10*3/uL (ref 0.7–4.0)
Neutro Abs: 3.3 10*3/uL (ref 1.7–7.7)
Neutrophils Relative %: 66 % (ref 43–77)

## 2010-09-04 LAB — COMPREHENSIVE METABOLIC PANEL
BUN: 10 mg/dL (ref 6–23)
CO2: 29 mEq/L (ref 19–32)
Calcium: 9.1 mg/dL (ref 8.4–10.5)
Chloride: 99 mEq/L (ref 96–112)
Creatinine, Ser: 1.12 mg/dL (ref 0.4–1.2)
GFR calc Af Amer: 60 mL/min — ABNORMAL LOW (ref >60)
GFR calc non Af Amer: 49 mL/min — ABNORMAL LOW (ref >60)
Glucose, Bld: 159 mg/dL — ABNORMAL HIGH (ref 70–99)
Total Bilirubin: 1.3 mg/dL — ABNORMAL HIGH (ref 0.3–1.2)

## 2010-09-04 LAB — URINE CULTURE: Colony Count: 50000

## 2010-09-04 LAB — URINALYSIS, ROUTINE W REFLEX MICROSCOPIC
Glucose, UA: NEGATIVE mg/dL
Hgb urine dipstick: NEGATIVE
Ketones, ur: NEGATIVE mg/dL
Protein, ur: NEGATIVE mg/dL
pH: 6 (ref 5.0–8.0)

## 2010-09-04 LAB — URINE MICROSCOPIC-ADD ON

## 2010-09-04 LAB — CBC
Hemoglobin: 12.1 g/dL (ref 12.0–15.0)
MCH: 27.5 pg (ref 26.0–34.0)
MCHC: 34.2 g/dL (ref 30.0–36.0)
MCV: 80.6 fL (ref 78.0–100.0)
RBC: 4.39 MIL/uL (ref 3.87–5.11)

## 2010-09-04 LAB — LIPASE, BLOOD: Lipase: 23 U/L (ref 11–59)

## 2010-09-05 LAB — BASIC METABOLIC PANEL
BUN: 3 mg/dL — ABNORMAL LOW (ref 6–23)
BUN: 6 mg/dL (ref 6–23)
CO2: 25 mEq/L (ref 19–32)
CO2: 26 mEq/L (ref 19–32)
CO2: 22 mEq/L (ref 19–32)
CO2: 23 mEq/L (ref 19–32)
Calcium: 7.8 mg/dL — ABNORMAL LOW (ref 8.4–10.5)
Chloride: 106 mEq/L (ref 96–112)
Chloride: 112 mEq/L (ref 96–112)
Chloride: 112 mEq/L (ref 96–112)
Chloride: 113 mEq/L — ABNORMAL HIGH (ref 96–112)
Creatinine, Ser: 0.73 mg/dL (ref 0.4–1.2)
Creatinine, Ser: 0.8 mg/dL (ref 0.4–1.2)
GFR calc Af Amer: >60 mL/min (ref >60)
GFR calc non Af Amer: >60 mL/min (ref >60)
Glucose, Bld: 58 mg/dL — ABNORMAL LOW (ref 70–99)
Glucose, Bld: 76 mg/dL (ref 70–99)
Glucose, Bld: 146 mg/dL — ABNORMAL HIGH (ref 70–99)
Potassium: 3.1 mEq/L — ABNORMAL LOW (ref 3.5–5.1)
Potassium: 4 mEq/L (ref 3.5–5.1)
Potassium: 2.8 mEq/L — ABNORMAL LOW (ref 3.5–5.1)
Sodium: 142 mEq/L (ref 135–145)
Sodium: 144 mEq/L (ref 135–145)

## 2010-09-05 LAB — URINALYSIS, ROUTINE W REFLEX MICROSCOPIC
Glucose, UA: NEGATIVE mg/dL
Leukocytes, UA: NEGATIVE
Nitrite: NEGATIVE
Nitrite: NEGATIVE
Protein, ur: NEGATIVE mg/dL
Specific Gravity, Urine: 1.017 (ref 1.005–1.030)
Urobilinogen, UA: 1 mg/dL (ref 0.0–1.0)
Urobilinogen, UA: 0.2 mg/dL (ref 0.0–1.0)
pH: 6 (ref 5.0–8.0)

## 2010-09-05 LAB — URINE CULTURE
Colony Count: 6000
Culture  Setup Time: 201109111102

## 2010-09-05 LAB — CBC
HCT: 39.6 % (ref 36.0–46.0)
MCH: 26.1 pg (ref 26.0–34.0)
MCHC: 33.6 g/dL (ref 30.0–36.0)
MCV: 77.8 fL — ABNORMAL LOW (ref 78.0–100.0)
RBC: 5.09 MIL/uL (ref 3.87–5.11)
RDW: 30.3 % — ABNORMAL HIGH (ref 11.5–15.5)
WBC: 7.4 10*3/uL (ref 4.0–10.5)

## 2010-09-05 LAB — COMPREHENSIVE METABOLIC PANEL
ALT: 33 U/L (ref 0–35)
ALT: 37 U/L — ABNORMAL HIGH (ref 0–35)
ALT: 51 U/L — ABNORMAL HIGH (ref 0–35)
AST: 38 U/L — ABNORMAL HIGH (ref 0–37)
AST: 28 U/L (ref 0–37)
AST: 44 U/L — ABNORMAL HIGH (ref 0–37)
Alkaline Phosphatase: 71 U/L (ref 39–117)
CO2: 25 mEq/L (ref 19–32)
Calcium: 9.5 mg/dL (ref 8.4–10.5)
Calcium: 8.3 mg/dL — ABNORMAL LOW (ref 8.4–10.5)
Calcium: 9.4 mg/dL (ref 8.4–10.5)
Creatinine, Ser: 0.93 mg/dL (ref 0.4–1.2)
Creatinine, Ser: 1.36 mg/dL — ABNORMAL HIGH (ref 0.4–1.2)
GFR calc Af Amer: >60 mL/min (ref >60)
GFR calc Af Amer: 48 mL/min — ABNORMAL LOW (ref >60)
GFR calc Af Amer: >60 mL/min (ref >60)
GFR calc non Af Amer: 56 mL/min — ABNORMAL LOW (ref >60)
Glucose, Bld: 140 mg/dL — ABNORMAL HIGH (ref 70–99)
Potassium: 2.7 mEq/L — CL (ref 3.5–5.1)
Sodium: 142 mEq/L (ref 135–145)
Sodium: 141 mEq/L (ref 135–145)
Sodium: 145 mEq/L (ref 135–145)
Total Protein: 6.8 g/dL (ref 6.0–8.3)
Total Protein: 5.5 g/dL — ABNORMAL LOW (ref 6.0–8.3)
Total Protein: 6.9 g/dL (ref 6.0–8.3)

## 2010-09-05 LAB — DIFFERENTIAL
Eosinophils Relative: 0 % (ref 0–5)
Eosinophils Relative: 1 % (ref 0–5)
Lymphocytes Relative: 3 % — ABNORMAL LOW (ref 12–46)
Lymphs Abs: 1.3 10*3/uL (ref 0.7–4.0)
Lymphs Abs: 0.2 10*3/uL — ABNORMAL LOW (ref 0.7–4.0)
Monocytes Relative: 6 % (ref 3–12)
Monocytes Relative: 5 % (ref 3–12)
Neutro Abs: 5.6 10*3/uL (ref 1.7–7.7)

## 2010-09-05 LAB — URINE MICROSCOPIC-ADD ON

## 2010-09-05 LAB — CORTISOL: Cortisol, Plasma: 57.2 ug/dL

## 2010-09-05 LAB — POCT CARDIAC MARKERS: Myoglobin, poc: 55.3 ng/mL (ref 12–200)

## 2010-09-05 LAB — LIPASE, BLOOD: Lipase: 27 U/L (ref 11–59)

## 2010-09-05 LAB — GLUCOSE, CAPILLARY

## 2010-09-05 LAB — MRSA PCR SCREENING: MRSA by PCR: NEGATIVE

## 2010-09-06 LAB — CBC
HCT: 33.7 % — ABNORMAL LOW (ref 36.0–46.0)
HCT: 34.4 % — ABNORMAL LOW (ref 36.0–46.0)
HCT: 36.9 % (ref 36.0–46.0)
Hemoglobin: 12.2 g/dL (ref 12.0–15.0)
MCH: 22.9 pg — ABNORMAL LOW (ref 26.0–34.0)
MCH: 23.1 pg — ABNORMAL LOW (ref 26.0–34.0)
MCHC: 32.2 g/dL (ref 30.0–36.0)
MCHC: 32.6 g/dL (ref 30.0–36.0)
MCV: 70.1 fL — ABNORMAL LOW (ref 78.0–100.0)
MCV: 70.3 fL — ABNORMAL LOW (ref 78.0–100.0)
MCV: 70.3 fL — ABNORMAL LOW (ref 78.0–100.0)
Platelets: 248 10*3/uL (ref 150–400)
Platelets: 257 10*3/uL (ref 150–400)
RBC: 4.8 MIL/uL (ref 3.87–5.11)
RBC: 5.27 MIL/uL — ABNORMAL HIGH (ref 3.87–5.11)
RDW: 32.1 % — ABNORMAL HIGH (ref 11.5–15.5)
RDW: 32.4 % — ABNORMAL HIGH (ref 11.5–15.5)
WBC: 4 10*3/uL (ref 4.0–10.5)
WBC: 4.1 10*3/uL (ref 4.0–10.5)

## 2010-09-06 LAB — COMPREHENSIVE METABOLIC PANEL
ALT: 15 U/L (ref 0–35)
ALT: 18 U/L (ref 0–35)
Albumin: 3.4 g/dL — ABNORMAL LOW (ref 3.5–5.2)
Albumin: 3.6 g/dL (ref 3.5–5.2)
Alkaline Phosphatase: 46 U/L (ref 39–117)
BUN: 7 mg/dL (ref 6–23)
BUN: 9 mg/dL (ref 6–23)
Calcium: 8.5 mg/dL (ref 8.4–10.5)
Chloride: 108 mEq/L (ref 96–112)
Creatinine, Ser: 1.02 mg/dL (ref 0.4–1.2)
Glucose, Bld: 85 mg/dL (ref 70–99)
Potassium: 3.4 mEq/L — ABNORMAL LOW (ref 3.5–5.1)
Sodium: 140 mEq/L (ref 135–145)
Sodium: 143 mEq/L (ref 135–145)
Total Bilirubin: 0.9 mg/dL (ref 0.3–1.2)
Total Protein: 5.7 g/dL — ABNORMAL LOW (ref 6.0–8.3)
Total Protein: 5.9 g/dL — ABNORMAL LOW (ref 6.0–8.3)

## 2010-09-06 LAB — DIFFERENTIAL
Basophils Absolute: 0 10*3/uL (ref 0.0–0.1)
Basophils Relative: 0 % (ref 0–1)
Basophils Relative: 0 % (ref 0–1)
Eosinophils Absolute: 0 10*3/uL (ref 0.0–0.7)
Eosinophils Relative: 0 % (ref 0–5)
Lymphocytes Relative: 5 % — ABNORMAL LOW (ref 12–46)
Lymphs Abs: 0.4 10*3/uL — ABNORMAL LOW (ref 0.7–4.0)
Monocytes Absolute: 0.3 10*3/uL (ref 0.1–1.0)
Monocytes Relative: 11 % (ref 3–12)
Neutro Abs: 3.4 10*3/uL (ref 1.7–7.7)
Neutro Abs: 3.4 10*3/uL (ref 1.7–7.7)
Neutrophils Relative %: 83 % — ABNORMAL HIGH (ref 43–77)

## 2010-09-06 LAB — BASIC METABOLIC PANEL
CO2: 25 mEq/L (ref 19–32)
Chloride: 108 mEq/L (ref 96–112)
Creatinine, Ser: 0.75 mg/dL (ref 0.4–1.2)
GFR calc Af Amer: >60 mL/min (ref >60)
Potassium: 4 mEq/L (ref 3.5–5.1)
Sodium: 141 mEq/L (ref 135–145)

## 2010-09-06 LAB — CARDIAC PANEL(CRET KIN+CKTOT+MB+TROPI)
CK, MB: 0.8 ng/mL (ref 0.3–4.0)
CK, MB: 0.8 ng/mL (ref 0.3–4.0)
Total CK: 34 U/L (ref 7–177)
Troponin I: 0.01 ng/mL (ref 0.00–0.06)
Troponin I: 0.01 ng/mL (ref 0.00–0.06)
Troponin I: 0.01 ng/mL (ref 0.00–0.06)

## 2010-09-06 LAB — MAGNESIUM: Magnesium: 2.1 mg/dL (ref 1.5–2.5)

## 2010-09-06 LAB — URINALYSIS, ROUTINE W REFLEX MICROSCOPIC
Bilirubin Urine: NEGATIVE
Glucose, UA: NEGATIVE mg/dL
Hgb urine dipstick: NEGATIVE
Specific Gravity, Urine: 1.009 (ref 1.005–1.030)
Urobilinogen, UA: 0.2 mg/dL (ref 0.0–1.0)

## 2010-09-06 LAB — URINE MICROSCOPIC-ADD ON

## 2010-09-06 LAB — LIPASE, BLOOD: Lipase: 23 U/L (ref 11–59)

## 2010-09-06 LAB — PROTIME-INR: INR: 1.08 (ref 0.00–1.49)

## 2010-09-06 LAB — APTT: aPTT: 27 seconds (ref 24–37)

## 2010-09-08 LAB — COMPREHENSIVE METABOLIC PANEL
Albumin: 3.9 g/dL (ref 3.5–5.2)
Alkaline Phosphatase: 68 U/L (ref 39–117)
BUN: 11 mg/dL (ref 6–23)
Calcium: 8.8 mg/dL (ref 8.4–10.5)
Potassium: 3.8 mEq/L (ref 3.5–5.1)
Sodium: 137 mEq/L (ref 135–145)
Total Protein: 7 g/dL (ref 6.0–8.3)

## 2010-09-08 LAB — BASIC METABOLIC PANEL
CO2: 25 mEq/L (ref 19–32)
Calcium: 8.4 mg/dL (ref 8.4–10.5)
Chloride: 112 mEq/L (ref 96–112)
GFR calc Af Amer: >60 mL/min (ref >60)
Glucose, Bld: 73 mg/dL (ref 70–99)
Potassium: 4 mEq/L (ref 3.5–5.1)
Sodium: 141 mEq/L (ref 135–145)

## 2010-09-08 LAB — VITAMIN B12: Vitamin B-12: 175 pg/mL — ABNORMAL LOW (ref 211–911)

## 2010-09-08 LAB — IRON AND TIBC
Iron: 15 ug/dL — ABNORMAL LOW (ref 42–135)
Saturation Ratios: 4 % — ABNORMAL LOW (ref 20–55)
TIBC: 342 ug/dL (ref 250–470)
UIBC: 327 ug/dL

## 2010-09-08 LAB — DIFFERENTIAL
Basophils Relative: 2 % — ABNORMAL HIGH (ref 0–1)
Eosinophils Absolute: 0 10*3/uL (ref 0.0–0.7)
Eosinophils Relative: 0 % (ref 0–5)
Lymphocytes Relative: 18 % (ref 12–46)
Neutro Abs: 6.6 10*3/uL (ref 1.7–7.7)
Neutrophils Relative %: 76 % (ref 43–77)

## 2010-09-08 LAB — RETICULOCYTES: Retic Ct Pct: 0.9 % (ref 0.4–3.1)

## 2010-09-08 LAB — URINALYSIS, ROUTINE W REFLEX MICROSCOPIC
Bilirubin Urine: NEGATIVE
Glucose, UA: NEGATIVE mg/dL
Hgb urine dipstick: NEGATIVE
Specific Gravity, Urine: 1.023 (ref 1.005–1.030)
pH: 7 (ref 5.0–8.0)

## 2010-09-08 LAB — CBC
HCT: 27.3 % — ABNORMAL LOW (ref 36.0–46.0)
HCT: 29.1 % — ABNORMAL LOW (ref 36.0–46.0)
Hemoglobin: 8.5 g/dL — ABNORMAL LOW (ref 12.0–15.0)
MCH: 19.8 pg — ABNORMAL LOW (ref 26.0–34.0)
MCH: 19.8 pg — ABNORMAL LOW (ref 26.0–34.0)
MCHC: 31.3 g/dL (ref 30.0–36.0)
MCHC: 31.5 g/dL (ref 30.0–36.0)
MCV: 62.9 fL — ABNORMAL LOW (ref 78.0–100.0)
MCV: 63.3 fL — ABNORMAL LOW (ref 78.0–100.0)
Platelets: 392 10*3/uL (ref 150–400)
Platelets: 434 10*3/uL — ABNORMAL HIGH (ref 150–400)
RBC: 4.3 MIL/uL (ref 3.87–5.11)
RDW: 19.5 % — ABNORMAL HIGH (ref 11.5–15.5)
RDW: 20.7 % — ABNORMAL HIGH (ref 11.5–15.5)
WBC: 5.4 10*3/uL (ref 4.0–10.5)
WBC: 8.8 10*3/uL (ref 4.0–10.5)

## 2010-09-08 LAB — URINE CULTURE: Colony Count: NO GROWTH

## 2010-09-08 LAB — LIPASE, BLOOD: Lipase: 25 U/L (ref 11–59)

## 2010-09-10 NOTE — Letter (Signed)
Summary: [facility]  [facility]   Imported By: Sherian Rein 09/04/2010 09:11:34  _____________________________________________________________________  External Attachment:    Type:   Image     Comment:   External Document

## 2010-09-26 ENCOUNTER — Encounter (HOSPITAL_BASED_OUTPATIENT_CLINIC_OR_DEPARTMENT_OTHER): Payer: BC Managed Care – PPO | Admitting: Oncology

## 2010-09-26 ENCOUNTER — Other Ambulatory Visit: Payer: Self-pay | Admitting: Oncology

## 2010-09-26 DIAGNOSIS — C8588 Other specified types of non-Hodgkin lymphoma, lymph nodes of multiple sites: Secondary | ICD-10-CM

## 2010-09-26 DIAGNOSIS — R630 Anorexia: Secondary | ICD-10-CM

## 2010-09-26 DIAGNOSIS — D702 Other drug-induced agranulocytosis: Secondary | ICD-10-CM

## 2010-09-26 DIAGNOSIS — T451X5A Adverse effect of antineoplastic and immunosuppressive drugs, initial encounter: Secondary | ICD-10-CM

## 2010-09-26 LAB — CBC WITH DIFFERENTIAL/PLATELET
BASO%: 0.3 % (ref 0.0–2.0)
Basophils Absolute: 0 10*3/uL (ref 0.0–0.1)
EOS%: 2.4 % (ref 0.0–7.0)
HGB: 8.9 g/dL — ABNORMAL LOW (ref 11.6–15.9)
MCH: 30.2 pg (ref 25.1–34.0)
MCHC: 34 g/dL (ref 31.5–36.0)
MCV: 88.9 fL (ref 79.5–101.0)
MONO%: 12.8 % (ref 0.0–14.0)
NEUT%: 56.7 % (ref 38.4–76.8)
RDW: 13.6 % (ref 11.2–14.5)
lymph#: 0.8 10*3/uL — ABNORMAL LOW (ref 0.9–3.3)

## 2010-09-26 LAB — COMPREHENSIVE METABOLIC PANEL
ALT: 11 U/L (ref 0–35)
AST: 19 U/L (ref 0–37)
Alkaline Phosphatase: 59 U/L (ref 39–117)
BUN: 36 mg/dL — ABNORMAL HIGH (ref 6–23)
Creatinine, Ser: 2.71 mg/dL — ABNORMAL HIGH (ref 0.40–1.20)

## 2010-11-05 ENCOUNTER — Encounter (HOSPITAL_BASED_OUTPATIENT_CLINIC_OR_DEPARTMENT_OTHER): Payer: BC Managed Care – PPO | Admitting: Oncology

## 2010-11-05 ENCOUNTER — Other Ambulatory Visit: Payer: Self-pay | Admitting: Oncology

## 2010-11-05 ENCOUNTER — Ambulatory Visit (HOSPITAL_COMMUNITY)
Admission: RE | Admit: 2010-11-05 | Discharge: 2010-11-05 | Disposition: A | Discharge status: Home / Self Care | Payer: BC Managed Care – PPO | Source: Ambulatory Visit | Attending: Oncology | Admitting: Oncology

## 2010-11-05 DIAGNOSIS — K0889 Other specified disorders of teeth and supporting structures: Secondary | ICD-10-CM | POA: Insufficient documentation

## 2010-11-05 DIAGNOSIS — C8590 Non-Hodgkin lymphoma, unspecified, unspecified site: Secondary | ICD-10-CM

## 2010-11-05 DIAGNOSIS — D709 Neutropenia, unspecified: Secondary | ICD-10-CM | POA: Insufficient documentation

## 2010-11-05 DIAGNOSIS — Z452 Encounter for adjustment and management of vascular access device: Secondary | ICD-10-CM

## 2010-11-05 DIAGNOSIS — M47812 Spondylosis without myelopathy or radiculopathy, cervical region: Secondary | ICD-10-CM | POA: Insufficient documentation

## 2010-11-05 DIAGNOSIS — C8588 Other specified types of non-Hodgkin lymphoma, lymph nodes of multiple sites: Secondary | ICD-10-CM

## 2010-11-05 DIAGNOSIS — C8589 Other specified types of non-Hodgkin lymphoma, extranodal and solid organ sites: Secondary | ICD-10-CM | POA: Insufficient documentation

## 2010-11-05 DIAGNOSIS — K279 Peptic ulcer, site unspecified, unspecified as acute or chronic, without hemorrhage or perforation: Secondary | ICD-10-CM | POA: Insufficient documentation

## 2010-11-05 LAB — CBC WITH DIFFERENTIAL/PLATELET
Eosinophils Absolute: 0.1 10*3/uL (ref 0.0–0.5)
MCV: 89.1 fL (ref 79.5–101.0)
MONO%: 13.4 % (ref 0.0–14.0)
NEUT#: 1.5 10*3/uL (ref 1.5–6.5)
RBC: 2.89 10*6/uL — ABNORMAL LOW (ref 3.70–5.45)
RDW: 13.4 % (ref 11.2–14.5)
WBC: 2.9 10*3/uL — ABNORMAL LOW (ref 3.9–10.3)
lymph#: 0.9 10*3/uL (ref 0.9–3.3)

## 2010-11-05 LAB — CMP (CANCER CENTER ONLY)
AST: 21 U/L (ref 11–38)
Albumin: 3.7 g/dL (ref 3.3–5.5)
Alkaline Phosphatase: 74 U/L (ref 26–84)
Glucose, Bld: 86 mg/dL (ref 73–118)
Potassium: 4.9 mEq/L — ABNORMAL HIGH (ref 3.3–4.7)
Sodium: 150 mEq/L — ABNORMAL HIGH (ref 128–145)
Total Protein: 6.5 g/dL (ref 6.4–8.1)

## 2010-11-05 IMAGING — CT CT CHEST W/O CM
3 of 5 series · 15 of 36 positions shown, 18 images · non-contrast
Comparison: [DATE]

CT CHEST

CLINICAL DATA: Follow up of lymphoma. Non-Hodgkins lymphoma
diagnosed [DATE].  Chemotherapy in progress.  History of small bowel
obstructions.  Peptic ulcer disease.  Elevated creatinine.

CT CHEST, ABDOMEN AND PELVIS WITHOUT  CONTRAST
TECHNIQUE: Contiguous axial images of the chest abdomen and pelvis
were obtained without  IV contrast administration.

[Series 2: rtn cap st · axial · 0.72mm/px · z∈[-719,-234]mm · 10 of 123 slices shown, 13 images]
[im 13/123  mediastinal]
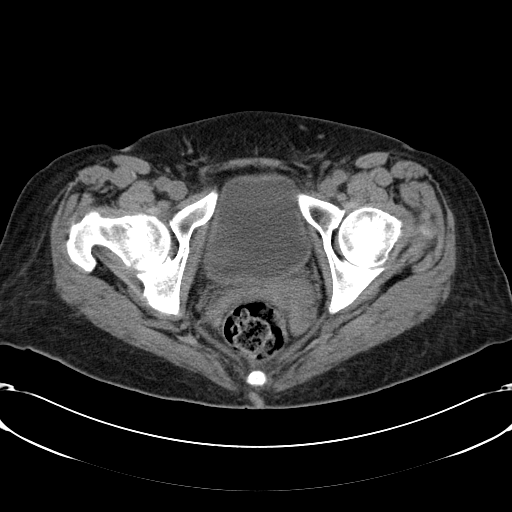
[im 13/123  lung]
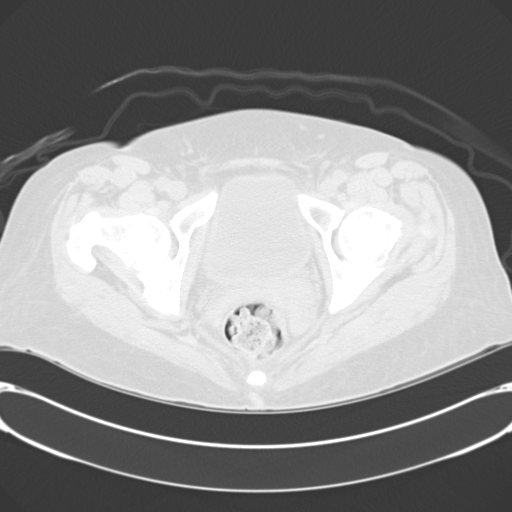
[im 25/123  lung]
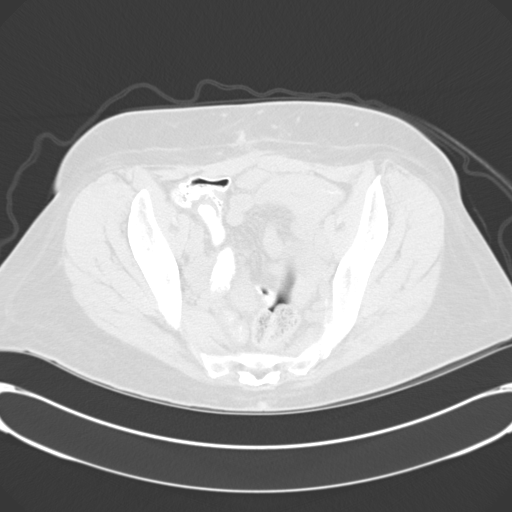
[im 37/123  lung]
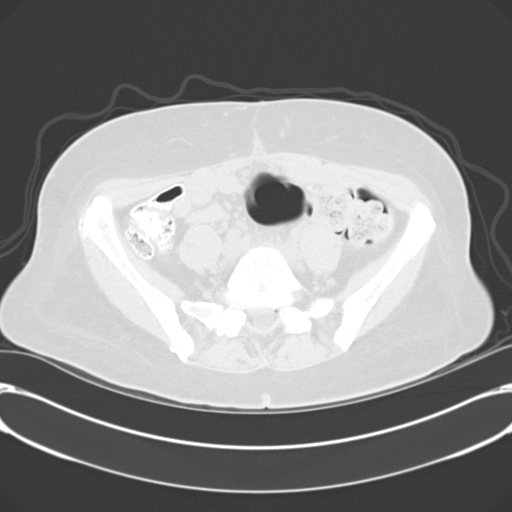
[im 49/123  lung]
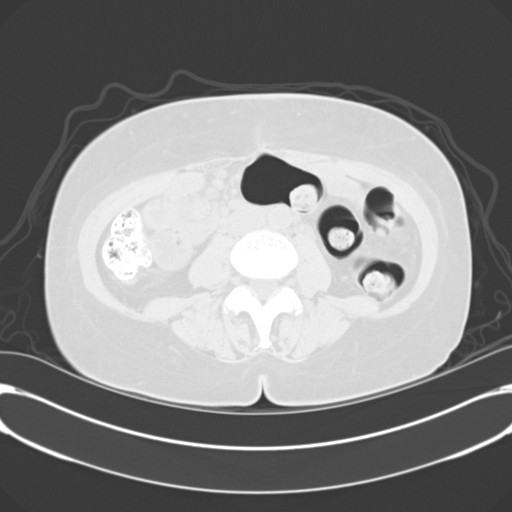
[im 62/123  mediastinal]
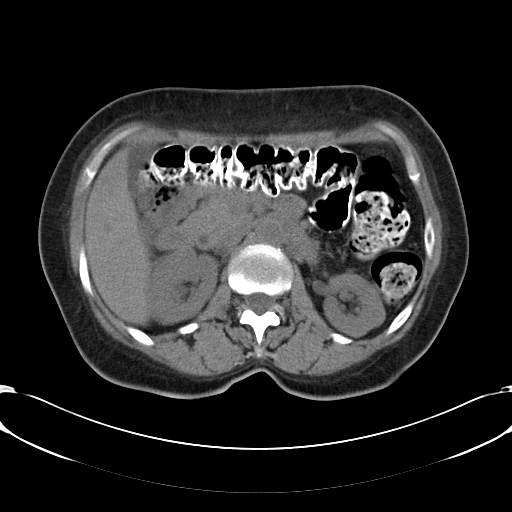
[im 62/123  lung]
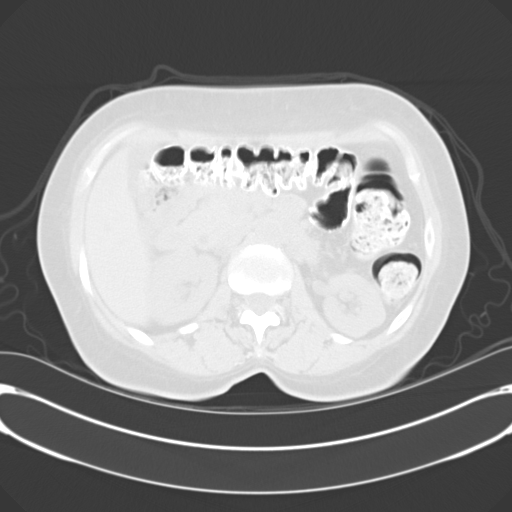
[im 74/123  lung]
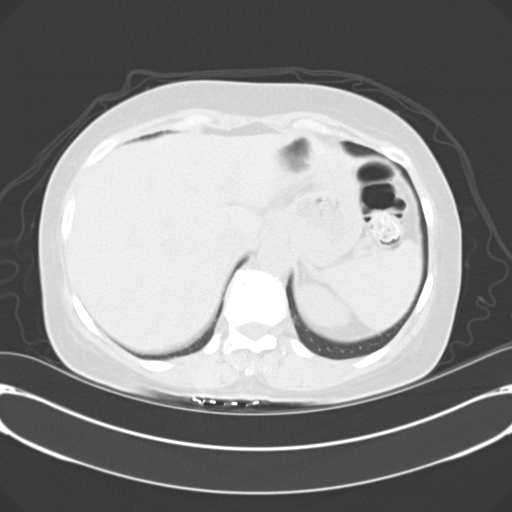
[im 79/123  lung]
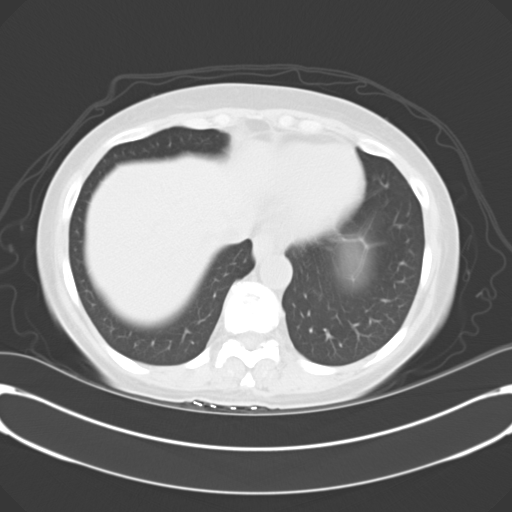
[im 86/123  lung]
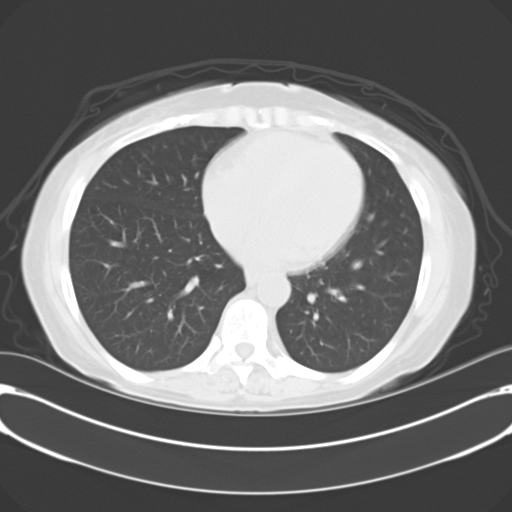
[im 98/123  mediastinal]
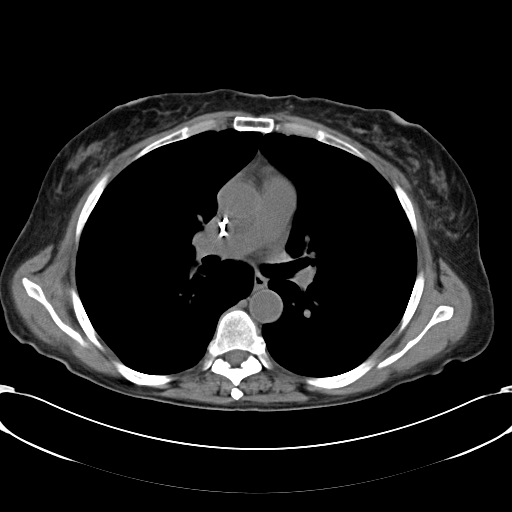
[im 98/123  lung]
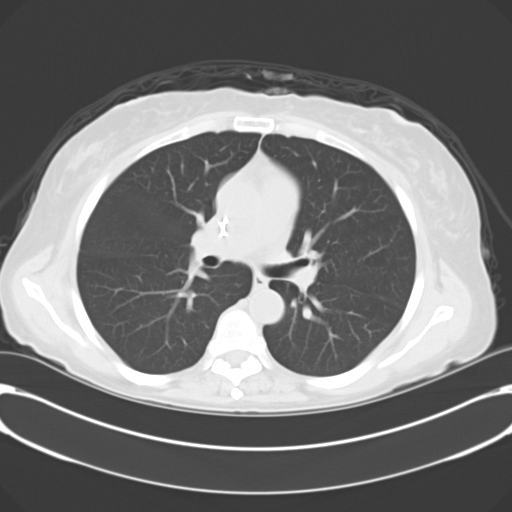
[im 110/123  lung]
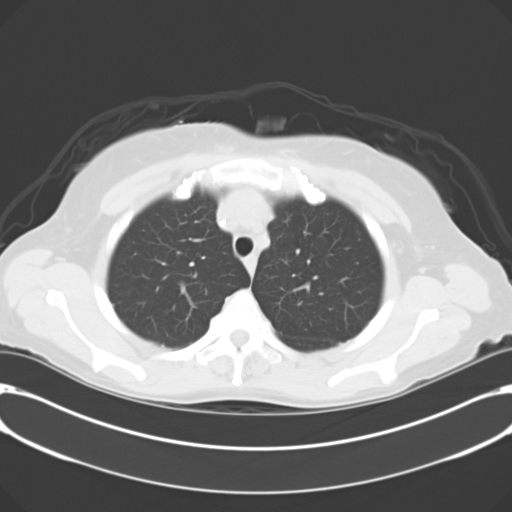

[Series 604: <mpr thick range(2)> · axial · 0.48mm/px · z∈[-260,-213]mm · 2 of 96 slices shown]
[im 16/96  lung]
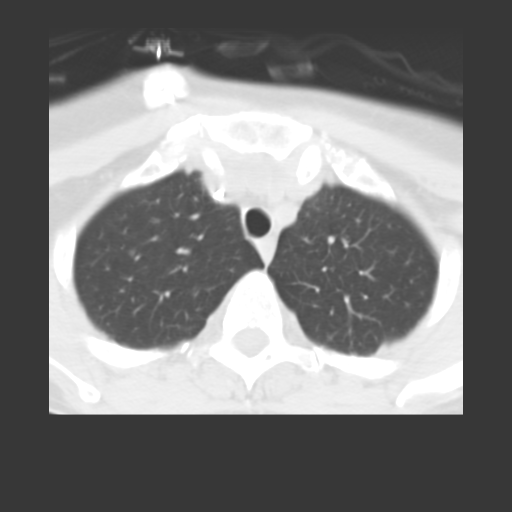
[im 32/96  lung]
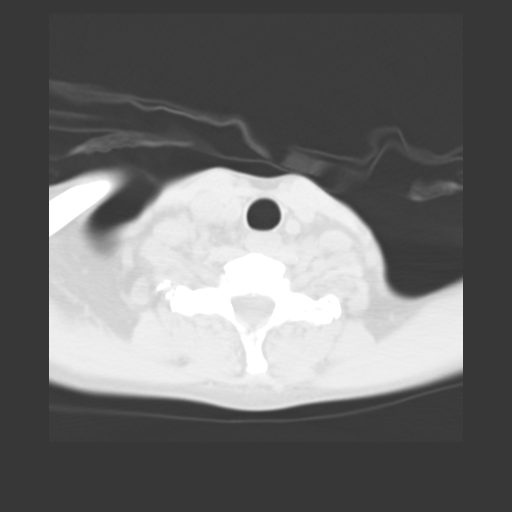

[Series 605: <mpr thick range(3)> · coronal · 0.48mm/px · 3 of 70 slices shown]
[im 14/70  lung]
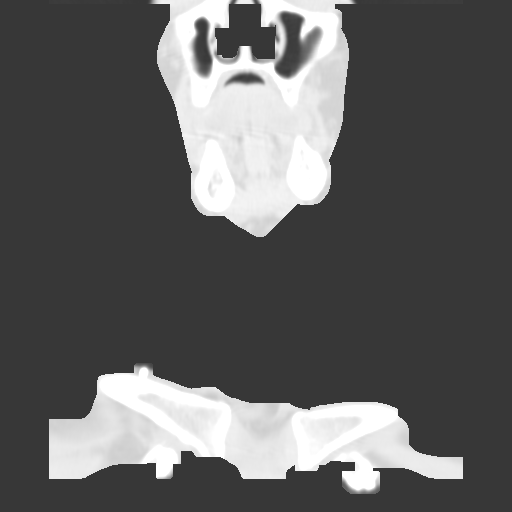
[im 28/70  lung]
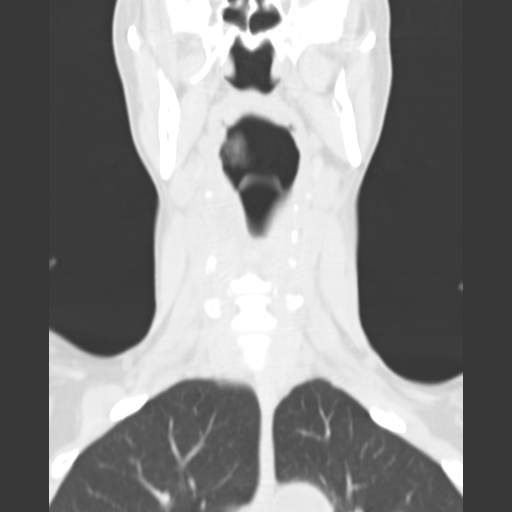
[im 42/70  lung]
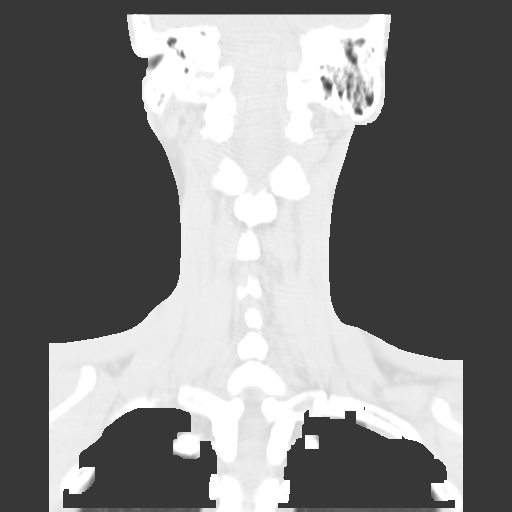

[15 of 36 positions shown; findings below may reference images not displayed]

FINDINGS: Lung windows demonstrate suspicion of of 4 mm right upper
lobe lung nodule on image 19 which is unchanged.
Left lung clear.

Soft tissue windows demonstrate a right-sided Port-A-Cath which
terminates at the high right atrium.

normal heart size without pericardial or pleural effusion.

Calcified coronary artery atherosclerosis within the proximal LAD
on images 29 and 30. No mediastinal or definite hilar adenopathy,
given limitations of unenhanced CT.
IMPRESSION: 1. No acute process or evidence of active lymphoma within the
chest.
2.  A probable 4 mm right upper lobe lung nodule which is stable
and may represent a subpleural lymph node.
3.  Coronary artery atherosclerosis.

CT ABDOMEN AND PELVIS
FINDINGS: Mild motion degradation within the upper abdomen.  Exam
also degraded by lack of IV contrast.  Normal uninfused appearance
of the liver, spleen, stomach.  Normal gallbladder.  The common
duct measures up to 8 mm in the region of the pancreatic head on
image 61.  This is unchanged.  No definite cause identified.

Normal adrenal glands and kidneys.  Limited evaluation for
retroperitoneal adenopathy. Colonic stool burden suggests
constipation.

The colon is well opacified with contrast.  The proximal and mid
small bowel demonstrate essentially no enteric opacification.
There is opacification of some pelvic small bowel loops.  Surgical
sutures are identified within the upper pelvic small bowel on image
94.  No small bowel obstruction identified.  Small bowel mesenteric
lymph node measures 9 mm on image 94 and is similar to on the prior
and not pathologic by size criteria.

A probable mesenteric nodule or lymph node measures 2.5 x 2.1 cm on
image 101.  This is suboptimally evaluated due to surrounding bowel
loops.  On the prior exam, this measures similarly, 2.3 cm.

No definite pelvic sidewall adenopathy.  Normal urinary bladder.
Hysterectomy. No significant free fluid.  No acute osseous
abnormality.  Degenerative disc disease involves the lumbosacral
junction.
IMPRESSION: 1.  Moderately degraded evaluation of the abdomen/pelvis.
Limitations include lack of IV contrast, mild motion degradation
superiorly, and minimal small bowel enteric opacification.
2.  Given these factors, similar mesenteric adenopathy.  No
evidence of residual or recurrent small bowel obstruction.
3.  The subcapsular liver mass described on the prior exam is not
well visualized.
4.  Chronic mild common duct dilatation.  Correlate with right
upper quadrant symptoms and possibly bilirubin level.

## 2010-11-07 ENCOUNTER — Encounter (HOSPITAL_BASED_OUTPATIENT_CLINIC_OR_DEPARTMENT_OTHER): Payer: BC Managed Care – PPO | Admitting: Oncology

## 2010-11-07 DIAGNOSIS — C8588 Other specified types of non-Hodgkin lymphoma, lymph nodes of multiple sites: Secondary | ICD-10-CM

## 2010-11-19 ENCOUNTER — Other Ambulatory Visit (INDEPENDENT_AMBULATORY_CARE_PROVIDER_SITE_OTHER): Payer: BC Managed Care – PPO

## 2010-11-19 ENCOUNTER — Other Ambulatory Visit: Payer: Self-pay | Admitting: Internal Medicine

## 2010-11-19 ENCOUNTER — Other Ambulatory Visit (INDEPENDENT_AMBULATORY_CARE_PROVIDER_SITE_OTHER): Payer: BC Managed Care – PPO | Admitting: Internal Medicine

## 2010-11-19 DIAGNOSIS — T887XXA Unspecified adverse effect of drug or medicament, initial encounter: Secondary | ICD-10-CM

## 2010-11-19 DIAGNOSIS — Z0000 Encounter for general adult medical examination without abnormal findings: Secondary | ICD-10-CM

## 2010-11-19 DIAGNOSIS — Z5189 Encounter for other specified aftercare: Secondary | ICD-10-CM

## 2010-11-19 DIAGNOSIS — M899 Disorder of bone, unspecified: Secondary | ICD-10-CM

## 2010-11-19 DIAGNOSIS — I10 Essential (primary) hypertension: Secondary | ICD-10-CM

## 2010-11-19 DIAGNOSIS — E538 Deficiency of other specified B group vitamins: Secondary | ICD-10-CM

## 2010-11-19 LAB — URINALYSIS, ROUTINE W REFLEX MICROSCOPIC
Specific Gravity, Urine: <=1.005 (ref 1.000–1.030)
Urobilinogen, UA: 0.2 (ref 0.0–1.0)
pH: 6 (ref 5.0–8.0)

## 2010-11-19 LAB — HEPATIC FUNCTION PANEL
ALT: 15 U/L (ref 0–35)
AST: 17 U/L (ref 0–37)
Albumin: 3.7 g/dL (ref 3.5–5.2)
Alkaline Phosphatase: 72 U/L (ref 39–117)
Total Bilirubin: 0.3 mg/dL (ref 0.3–1.2)

## 2010-11-19 LAB — LIPID PANEL
Total CHOL/HDL Ratio: 2
Triglycerides: 81 mg/dL (ref 0.0–149.0)

## 2010-11-19 LAB — BASIC METABOLIC PANEL
CO2: 26 mEq/L (ref 19–32)
Calcium: 9.1 mg/dL (ref 8.4–10.5)
Creatinine, Ser: 2.7 mg/dL — ABNORMAL HIGH (ref 0.4–1.2)
GFR: 22.72 mL/min — ABNORMAL LOW (ref >60.00)
Sodium: 142 mEq/L (ref 135–145)

## 2010-11-19 LAB — CBC WITH DIFFERENTIAL/PLATELET
Basophils Relative: 0.4 % (ref 0.0–3.0)
Eosinophils Relative: 2.7 % (ref 0.0–5.0)
HCT: 24.6 % — ABNORMAL LOW (ref 36.0–46.0)
Lymphs Abs: 0.7 10*3/uL (ref 0.7–4.0)
MCV: 89.7 fl (ref 78.0–100.0)
Monocytes Relative: 15.7 % — ABNORMAL HIGH (ref 3.0–12.0)
Platelets: 188 10*3/uL (ref 150.0–400.0)
RBC: 2.74 Mil/uL — ABNORMAL LOW (ref 3.87–5.11)
WBC: 2.4 10*3/uL — ABNORMAL LOW (ref 4.5–10.5)

## 2010-11-19 LAB — VITAMIN D 25 HYDROXY (VIT D DEFICIENCY, FRACTURES): Vit D, 25-Hydroxy: 45 ng/mL (ref 30–89)

## 2010-11-20 LAB — VITAMIN B12: Vitamin B-12: 149 pg/mL — ABNORMAL LOW (ref 211–911)

## 2010-11-21 ENCOUNTER — Encounter: Payer: Self-pay | Admitting: Internal Medicine

## 2010-11-22 ENCOUNTER — Encounter: Payer: Self-pay | Admitting: Internal Medicine

## 2010-11-22 ENCOUNTER — Ambulatory Visit (INDEPENDENT_AMBULATORY_CARE_PROVIDER_SITE_OTHER): Payer: BC Managed Care – PPO | Admitting: Internal Medicine

## 2010-11-22 DIAGNOSIS — R1013 Epigastric pain: Secondary | ICD-10-CM

## 2010-11-22 DIAGNOSIS — R609 Edema, unspecified: Secondary | ICD-10-CM

## 2010-11-22 DIAGNOSIS — I10 Essential (primary) hypertension: Secondary | ICD-10-CM

## 2010-11-22 DIAGNOSIS — E559 Vitamin D deficiency, unspecified: Secondary | ICD-10-CM

## 2010-11-22 DIAGNOSIS — E538 Deficiency of other specified B group vitamins: Secondary | ICD-10-CM

## 2010-11-22 DIAGNOSIS — D649 Anemia, unspecified: Secondary | ICD-10-CM

## 2010-11-22 MED ORDER — CYANOCOBALAMIN 1000 MCG/ML IJ SOLN
1000.0000 ug | Freq: Once | INTRAMUSCULAR | Status: AC
  Administered 2010-11-22: 1000 ug via INTRAMUSCULAR

## 2010-11-22 MED ORDER — CYANOCOBALAMIN 500 MCG/0.1ML NA SOLN: 500.0000 ug | NASAL | Status: DC

## 2010-11-22 MED ORDER — AMLODIPINE BESYLATE 5 MG PO TABS: 5.0000 mg | ORAL_TABLET | Freq: Every day | ORAL | Status: DC

## 2010-11-22 NOTE — Assessment & Plan Note (Signed)
Resolved

## 2010-11-22 NOTE — Patient Instructions (Signed)
Losartan 1/2 tab a day Normal BP <130/85

## 2010-11-22 NOTE — Progress Notes (Signed)
  Subjective:    Patient ID: Christine Morales, female    DOB: 09-20-1945, 65 y.o.   MRN: [id number]  HPI   The patient is here to follow up on wt loss,chronic depression, anxiety, headaches and chronic moderate fibromyalgia symptoms controlled with medicines, diet and exercise.   Review of Systems  Constitutional: Positive for fatigue. Negative for chills, activity change, appetite change and unexpected weight change.  HENT: Negative for congestion, mouth sores and sinus pressure.   Eyes: Negative for visual disturbance.  Respiratory: Negative for cough and chest tightness.   Gastrointestinal: Negative for nausea and abdominal pain.  Genitourinary: Negative for frequency, difficulty urinating and vaginal pain.  Musculoskeletal: Negative for back pain and gait problem.  Skin: Negative for pallor and rash.  Neurological: Positive for numbness. Negative for dizziness, tremors, weakness and headaches.  Psychiatric/Behavioral: Negative for confusion and sleep disturbance.    Wt Readings from Last 3 Encounters:  11/22/10 148 lb (67.132 kg)  08/23/10 144 lb (65.318 kg)  03/14/10 177 lb (80.287 kg)        Objective:   Physical Exam  Constitutional: She appears well-developed and well-nourished. No distress.  HENT:  Head: Normocephalic.  Right Ear: External ear normal.  Left Ear: External ear normal.  Nose: Nose normal.  Mouth/Throat: Oropharynx is clear and moist.  Eyes: Conjunctivae are normal. Pupils are equal, round, and reactive to light. Right eye exhibits no discharge. Left eye exhibits no discharge.  Neck: Normal range of motion. Neck supple. No JVD present. No tracheal deviation present. No thyromegaly present.  Cardiovascular: Normal rate, regular rhythm and normal heart sounds.   Pulmonary/Chest: No stridor. No respiratory distress. She has no wheezes.  Abdominal: Soft. Bowel sounds are normal. She exhibits no distension and no mass. There is no tenderness. There is no rebound  and no guarding.  Musculoskeletal: She exhibits no edema and no tenderness.  Lymphadenopathy:    She has no cervical adenopathy.  Neurological: She displays normal reflexes. No cranial nerve deficit. She exhibits normal muscle tone. Coordination normal.  Skin: No rash noted. No erythema.  Psychiatric: She has a normal mood and affect. Her behavior is normal. Judgment and thought content normal.          Assessment & Plan:    A complex case

## 2010-11-22 NOTE — Assessment & Plan Note (Signed)
Check BP at home See Meds

## 2010-11-22 NOTE — Assessment & Plan Note (Signed)
Try Nascobal Declined self-shots

## 2010-11-23 ENCOUNTER — Encounter: Payer: Self-pay | Admitting: Internal Medicine

## 2010-12-19 ENCOUNTER — Encounter (HOSPITAL_BASED_OUTPATIENT_CLINIC_OR_DEPARTMENT_OTHER): Payer: BC Managed Care – PPO | Admitting: Oncology

## 2010-12-19 DIAGNOSIS — C8588 Other specified types of non-Hodgkin lymphoma, lymph nodes of multiple sites: Secondary | ICD-10-CM

## 2011-01-17 ENCOUNTER — Encounter: Payer: Self-pay | Admitting: Internal Medicine

## 2011-02-05 ENCOUNTER — Ambulatory Visit: Payer: BC Managed Care – PPO | Admitting: Internal Medicine

## 2011-03-11 ENCOUNTER — Other Ambulatory Visit: Payer: Self-pay | Admitting: Oncology

## 2011-03-11 ENCOUNTER — Encounter (HOSPITAL_BASED_OUTPATIENT_CLINIC_OR_DEPARTMENT_OTHER): Payer: BC Managed Care – PPO | Admitting: Oncology

## 2011-03-11 ENCOUNTER — Encounter: Payer: Self-pay | Admitting: Internal Medicine

## 2011-03-11 ENCOUNTER — Ambulatory Visit (INDEPENDENT_AMBULATORY_CARE_PROVIDER_SITE_OTHER): Payer: BC Managed Care – PPO | Admitting: Internal Medicine

## 2011-03-11 VITALS — BP 116/70 | HR 76 | Temp 97.4°F | Resp 16 | Wt 164.0 lb

## 2011-03-11 DIAGNOSIS — R609 Edema, unspecified: Secondary | ICD-10-CM

## 2011-03-11 DIAGNOSIS — N289 Disorder of kidney and ureter, unspecified: Secondary | ICD-10-CM

## 2011-03-11 DIAGNOSIS — D509 Iron deficiency anemia, unspecified: Secondary | ICD-10-CM

## 2011-03-11 DIAGNOSIS — D702 Other drug-induced agranulocytosis: Secondary | ICD-10-CM

## 2011-03-11 DIAGNOSIS — I10 Essential (primary) hypertension: Secondary | ICD-10-CM

## 2011-03-11 DIAGNOSIS — R634 Abnormal weight loss: Secondary | ICD-10-CM

## 2011-03-11 DIAGNOSIS — C8590 Non-Hodgkin lymphoma, unspecified, unspecified site: Secondary | ICD-10-CM

## 2011-03-11 DIAGNOSIS — C8583 Other specified types of non-Hodgkin lymphoma, intra-abdominal lymph nodes: Secondary | ICD-10-CM

## 2011-03-11 DIAGNOSIS — T451X5A Adverse effect of antineoplastic and immunosuppressive drugs, initial encounter: Secondary | ICD-10-CM

## 2011-03-11 DIAGNOSIS — E538 Deficiency of other specified B group vitamins: Secondary | ICD-10-CM

## 2011-03-11 DIAGNOSIS — C8588 Other specified types of non-Hodgkin lymphoma, lymph nodes of multiple sites: Secondary | ICD-10-CM

## 2011-03-11 DIAGNOSIS — R630 Anorexia: Secondary | ICD-10-CM

## 2011-03-11 DIAGNOSIS — Z0000 Encounter for general adult medical examination without abnormal findings: Secondary | ICD-10-CM

## 2011-03-11 LAB — COMPREHENSIVE METABOLIC PANEL
ALT: 9 U/L (ref 0–35)
AST: 15 U/L (ref 0–37)
CO2: 22 mEq/L (ref 19–32)
Calcium: 9.6 mg/dL (ref 8.4–10.5)
Chloride: 107 mEq/L (ref 96–112)
Sodium: 141 mEq/L (ref 135–145)
Total Protein: 6.7 g/dL (ref 6.0–8.3)

## 2011-03-11 LAB — CBC WITH DIFFERENTIAL/PLATELET
BASO%: 0.4 % (ref 0.0–2.0)
EOS%: 1.7 % (ref 0.0–7.0)
HCT: 29.1 % — ABNORMAL LOW (ref 34.8–46.6)
MCH: 30.9 pg (ref 25.1–34.0)
MCHC: 35.1 g/dL (ref 31.5–36.0)
MCV: 88.1 fL (ref 79.5–101.0)
MONO%: 9.5 % (ref 0.0–14.0)
NEUT%: 57.3 % (ref 38.4–76.8)
lymph#: 1.1 10*3/uL (ref 0.9–3.3)

## 2011-03-11 NOTE — Progress Notes (Signed)
  Subjective:    Patient ID: Tona Sensing, female    DOB: 1946/04/02, 65 y.o.   MRN: [id number]  HPI  The patient presents for a follow-up of  chronic hypertension, anemia, lymphoma controlled with medicines F/u on wt loss   Review of Systems  Constitutional: Negative for chills, activity change, appetite change, fatigue and unexpected weight change.  HENT: Negative for congestion, mouth sores and sinus pressure.   Eyes: Negative for visual disturbance.  Respiratory: Negative for cough and chest tightness.   Gastrointestinal: Negative for nausea and abdominal pain.  Genitourinary: Negative for frequency, difficulty urinating and vaginal pain.  Musculoskeletal: Negative for back pain and gait problem.  Skin: Negative for pallor and rash.  Neurological: Negative for dizziness, tremors, weakness, numbness and headaches.  Psychiatric/Behavioral: Negative for confusion and sleep disturbance.       Objective:   Physical Exam  Constitutional: She appears well-developed and well-nourished. No distress.  HENT:  Head: Normocephalic.  Right Ear: External ear normal.  Left Ear: External ear normal.  Nose: Nose normal.  Mouth/Throat: Oropharynx is clear and moist.  Eyes: Conjunctivae are normal. Pupils are equal, round, and reactive to light. Right eye exhibits no discharge. Left eye exhibits no discharge.  Neck: Normal range of motion. Neck supple. No JVD present. No tracheal deviation present. No thyromegaly present.  Cardiovascular: Normal rate, regular rhythm and normal heart sounds.   Pulmonary/Chest: No stridor. No respiratory distress. She has no wheezes.  Abdominal: Soft. Bowel sounds are normal. She exhibits no distension and no mass. There is no tenderness. There is no rebound and no guarding.  Musculoskeletal: She exhibits no edema and no tenderness.  Lymphadenopathy:    She has no cervical adenopathy.  Neurological: She displays normal reflexes. No cranial nerve deficit. She  exhibits normal muscle tone. Coordination normal.  Skin: No rash noted. No erythema.  Psychiatric: She has a normal mood and affect. Her behavior is normal. Judgment and thought content normal.     Lab Results  Component Value Date   WBC 2.4 Repeated and verified X2.* 11/19/2010   HGB 10.2* 03/11/2011   HCT 29.1* 03/11/2011   PLT 208 03/11/2011   CHOL 148 11/19/2010   TRIG 81.0 11/19/2010   HDL 69.80 11/19/2010   ALT 15 11/19/2010   AST 17 11/19/2010   NA 142 11/19/2010   K 4.5 11/19/2010   CL 108 11/19/2010   CREATININE 2.7* 11/19/2010   BUN 38* 11/19/2010   CO2 26 11/19/2010   TSH 2.43 11/19/2010   INR 1.08 01/27/2010        Assessment & Plan:

## 2011-03-11 NOTE — Assessment & Plan Note (Signed)
Monitoring CBC 

## 2011-03-11 NOTE — Assessment & Plan Note (Signed)
Resolved

## 2011-03-11 NOTE — Assessment & Plan Note (Signed)
Will recheck B12

## 2011-03-11 NOTE — Assessment & Plan Note (Signed)
resolved 

## 2011-03-11 NOTE — Assessment & Plan Note (Signed)
Continue with current prescription therapy as reflected on the Med list. Controlled well

## 2011-03-11 NOTE — Assessment & Plan Note (Signed)
Hem-Onc f/u appt is today

## 2011-03-20 ENCOUNTER — Other Ambulatory Visit (INDEPENDENT_AMBULATORY_CARE_PROVIDER_SITE_OTHER): Payer: BC Managed Care – PPO

## 2011-03-20 DIAGNOSIS — E538 Deficiency of other specified B group vitamins: Secondary | ICD-10-CM

## 2011-03-20 DIAGNOSIS — R609 Edema, unspecified: Secondary | ICD-10-CM

## 2011-03-20 DIAGNOSIS — D649 Anemia, unspecified: Secondary | ICD-10-CM

## 2011-03-20 DIAGNOSIS — E559 Vitamin D deficiency, unspecified: Secondary | ICD-10-CM

## 2011-03-20 DIAGNOSIS — R1013 Epigastric pain: Secondary | ICD-10-CM

## 2011-03-20 DIAGNOSIS — I10 Essential (primary) hypertension: Secondary | ICD-10-CM

## 2011-03-21 ENCOUNTER — Telehealth: Payer: Self-pay | Admitting: Internal Medicine

## 2011-03-21 LAB — CBC WITH DIFFERENTIAL/PLATELET
Basophils Absolute: 0 10*3/uL (ref 0.0–0.1)
Basophils Relative: 0.5 % (ref 0.0–3.0)
Eosinophils Absolute: 0.1 10*3/uL (ref 0.0–0.7)
Lymphocytes Relative: 38.7 % (ref 12.0–46.0)
MCHC: 34.5 g/dL (ref 30.0–36.0)
Neutrophils Relative %: 45.3 % (ref 43.0–77.0)
Platelets: 202 10*3/uL (ref 150.0–400.0)
RBC: 3.08 Mil/uL — ABNORMAL LOW (ref 3.87–5.11)

## 2011-03-21 LAB — BASIC METABOLIC PANEL
BUN: 46 mg/dL — ABNORMAL HIGH (ref 6–23)
CO2: 24 mEq/L (ref 19–32)
Calcium: 8.8 mg/dL (ref 8.4–10.5)
Creatinine, Ser: 2.4 mg/dL — ABNORMAL HIGH (ref 0.4–1.2)

## 2011-03-21 LAB — VITAMIN D 25 HYDROXY (VIT D DEFICIENCY, FRACTURES): Vit D, 25-Hydroxy: 37 ng/mL (ref 30–89)

## 2011-03-21 LAB — VITAMIN B12: Vitamin B-12: 152 pg/mL — ABNORMAL LOW (ref 211–911)

## 2011-03-21 LAB — IBC PANEL: Transferrin: 215.6 mg/dL (ref 212.0–360.0)

## 2011-03-21 NOTE — Telephone Encounter (Signed)
Left detailed mess informing pt of below. And to call back to schedule OV.

## 2011-03-21 NOTE — Telephone Encounter (Signed)
Christine Morales, please, inform patient that all labs are normal except for low Vit B12. OV to start shots Thx

## 2011-03-24 ENCOUNTER — Telehealth: Payer: Self-pay | Admitting: Internal Medicine

## 2011-03-24 NOTE — Telephone Encounter (Signed)
Christine Morales, please, inform patient that all labs are normal except for low Vit B12 OV this wk to start shots Thx

## 2011-03-25 NOTE — Telephone Encounter (Signed)
Called pt- no working home/cell number. Lady at her work number states she isnt available.

## 2011-03-26 ENCOUNTER — Ambulatory Visit: Payer: BC Managed Care – PPO | Admitting: Internal Medicine

## 2011-03-27 ENCOUNTER — Ambulatory Visit (INDEPENDENT_AMBULATORY_CARE_PROVIDER_SITE_OTHER): Payer: BC Managed Care – PPO | Admitting: Internal Medicine

## 2011-03-27 ENCOUNTER — Encounter: Payer: Self-pay | Admitting: Internal Medicine

## 2011-03-27 VITALS — BP 118/58 | HR 88 | Temp 97.6°F | Wt 167.0 lb

## 2011-03-27 DIAGNOSIS — D509 Iron deficiency anemia, unspecified: Secondary | ICD-10-CM

## 2011-03-27 DIAGNOSIS — E538 Deficiency of other specified B group vitamins: Secondary | ICD-10-CM

## 2011-03-27 MED ORDER — CYANOCOBALAMIN 1000 MCG/ML IJ SOLN: 1000.0000 ug | Freq: Once | INTRAMUSCULAR | Status: DC

## 2011-03-27 MED ORDER — "SYRINGE/NEEDLE (DISP) 30G X 1/2"" 1 ML MISC": 1.0000 | Status: DC

## 2011-03-27 NOTE — Patient Instructions (Signed)
Vit B12 1000 mcg daily x 6 ays, then weekly x 4 weeks, then monthly

## 2011-03-27 NOTE — Progress Notes (Signed)
  Subjective:    Patient ID: Christine Morales, female    DOB: [birth year], 65 y.o.   MRN: [id number]  HPI  F/u on low B12 level  Review of Systems  Constitutional: Positive for fatigue.  Respiratory: Negative for shortness of breath.   Gastrointestinal: Negative for nausea.       Objective:   Physical Exam  Constitutional: She appears well-developed. No distress.  HENT:  Head: Normocephalic.  Eyes: Pupils are equal, round, and reactive to light. Right eye exhibits no discharge. Left eye exhibits no discharge.  Neck: Neck supple.  Cardiovascular: Normal rate, regular rhythm and normal heart sounds.   Pulmonary/Chest: No stridor. No respiratory distress. She has no wheezes.  Abdominal: Soft. Bowel sounds are normal. She exhibits no distension.  Skin: No rash noted. No erythema.  Psychiatric: She has a normal mood and affect. Her behavior is normal.   Lab Results  Component Value Date   WBC 3.1* 03/20/2011   HGB 9.4* 03/20/2011   HCT 27.2* 03/20/2011   PLT 202.0 03/20/2011   GLUCOSE 89 03/20/2011   CHOL 148 11/19/2010   TRIG 81.0 11/19/2010   HDL 69.80 11/19/2010   LDLCALC 62 11/19/2010   ALT 9 03/11/2011   AST 15 03/11/2011   NA 142 03/20/2011   K 4.8 03/20/2011   CL 109 03/20/2011   CREATININE 2.4* 03/20/2011   BUN 46* 03/20/2011   CO2 24 03/20/2011   TSH 2.43 11/19/2010   INR 1.08 01/27/2010          Assessment & Plan:

## 2011-03-27 NOTE — Assessment & Plan Note (Signed)
Discussed. Start on B12. See meds

## 2011-03-27 NOTE — Assessment & Plan Note (Signed)
Continue with current prescription therapy as reflected on the Med list.  

## 2011-04-03 NOTE — Telephone Encounter (Signed)
Left mess for patient to call back.  

## 2011-04-04 NOTE — Telephone Encounter (Signed)
Called pt at wk number. Lady who answered states she is not available. Called home phone and left detailed mess advising her of below.

## 2011-04-17 ENCOUNTER — Encounter: Payer: Self-pay | Admitting: Oncology

## 2011-04-17 ENCOUNTER — Other Ambulatory Visit: Payer: Self-pay | Admitting: Oncology

## 2011-05-04 ENCOUNTER — Other Ambulatory Visit: Payer: Self-pay | Admitting: Oncology

## 2011-05-06 ENCOUNTER — Ambulatory Visit (HOSPITAL_BASED_OUTPATIENT_CLINIC_OR_DEPARTMENT_OTHER): Payer: BC Managed Care – PPO

## 2011-05-06 VITALS — BP 134/82 | HR 71 | Temp 97.5°F

## 2011-05-06 DIAGNOSIS — C8583 Other specified types of non-Hodgkin lymphoma, intra-abdominal lymph nodes: Secondary | ICD-10-CM

## 2011-05-06 DIAGNOSIS — C8588 Other specified types of non-Hodgkin lymphoma, lymph nodes of multiple sites: Secondary | ICD-10-CM

## 2011-05-06 DIAGNOSIS — Z452 Encounter for adjustment and management of vascular access device: Secondary | ICD-10-CM

## 2011-05-06 MED ORDER — SODIUM CHLORIDE 0.9 % IJ SOLN
10.0000 mL | INTRAMUSCULAR | Status: DC | PRN
  Administered 2011-05-06: 10 mL via INTRAVENOUS
  Filled 2011-05-06: qty 10

## 2011-05-06 MED ORDER — HEPARIN SOD (PORK) LOCK FLUSH 100 UNIT/ML IV SOLN
500.0000 [IU] | Freq: Once | INTRAVENOUS | Status: AC | PRN
  Administered 2011-05-06: 500 [IU] via INTRAVENOUS
  Filled 2011-05-06: qty 5

## 2011-06-21 ENCOUNTER — Telehealth: Payer: Self-pay | Admitting: Oncology

## 2011-06-21 NOTE — Telephone Encounter (Signed)
Not able to reach pt by phone due to home phone d/c. Added lb to 1/8 ct and f/u for 1/15. Jan schedule mailed today.

## 2011-07-01 ENCOUNTER — Ambulatory Visit (HOSPITAL_COMMUNITY)
Admission: RE | Admit: 2011-07-01 | Discharge: 2011-07-01 | Disposition: A | Discharge status: Home / Self Care | Payer: BC Managed Care – PPO | Source: Ambulatory Visit | Attending: Oncology | Admitting: Oncology

## 2011-07-01 ENCOUNTER — Other Ambulatory Visit (HOSPITAL_BASED_OUTPATIENT_CLINIC_OR_DEPARTMENT_OTHER): Payer: BC Managed Care – PPO

## 2011-07-01 ENCOUNTER — Other Ambulatory Visit: Payer: Self-pay | Admitting: Oncology

## 2011-07-01 ENCOUNTER — Encounter (HOSPITAL_COMMUNITY): Payer: Self-pay

## 2011-07-01 DIAGNOSIS — C8590 Non-Hodgkin lymphoma, unspecified, unspecified site: Secondary | ICD-10-CM

## 2011-07-01 DIAGNOSIS — R599 Enlarged lymph nodes, unspecified: Secondary | ICD-10-CM | POA: Insufficient documentation

## 2011-07-01 DIAGNOSIS — C8589 Other specified types of non-Hodgkin lymphoma, extranodal and solid organ sites: Secondary | ICD-10-CM | POA: Insufficient documentation

## 2011-07-01 DIAGNOSIS — C8588 Other specified types of non-Hodgkin lymphoma, lymph nodes of multiple sites: Secondary | ICD-10-CM

## 2011-07-01 HISTORY — DX: Malignant (primary) neoplasm, unspecified: C80.1

## 2011-07-01 LAB — CBC WITH DIFFERENTIAL/PLATELET
Eosinophils Absolute: 0.1 10*3/uL (ref 0.0–0.5)
LYMPH%: 25.5 % (ref 14.0–49.7)
MONO#: 0.3 10*3/uL (ref 0.1–0.9)
NEUT#: 3.1 10*3/uL (ref 1.5–6.5)
Platelets: 226 10*3/uL (ref 145–400)
RBC: 3.65 10*6/uL — ABNORMAL LOW (ref 3.70–5.45)
WBC: 4.7 10*3/uL (ref 3.9–10.3)
lymph#: 1.2 10*3/uL (ref 0.9–3.3)

## 2011-07-01 LAB — CMP (CANCER CENTER ONLY)
ALT(SGPT): 16 U/L (ref 10–47)
AST: 17 U/L (ref 11–38)
Alkaline Phosphatase: 119 U/L — ABNORMAL HIGH (ref 26–84)
CO2: 30 mEq/L (ref 18–33)
Sodium: 148 mEq/L — ABNORMAL HIGH (ref 128–145)
Total Bilirubin: 0.6 mg/dl (ref 0.20–1.60)
Total Protein: 7 g/dL (ref 6.4–8.1)

## 2011-07-01 IMAGING — CT CT CHEST W/O CM
2 of 8 series · 11 of 46 positions shown, 18 images · non-contrast
Comparison: [DATE]

CT NECK

CLINICAL DATA: Follow-up non-Hodgkins lymphoma.  Previous
chemotherapy.

CT NECK, CHEST, ABDOMEN AND PELVIS WITHOUT CONTRAST
TECHNIQUE: Multidetector CT imaging of the neck, chest, abdomen
and pelvis was performed using the standard protocol without
intravenous contrast.

[Series 2: cap st · axial · 0.68mm/px · z∈[-730,-225]mm · 9 of 125 slices shown, 15 images]
[im 12/125  soft-tissue]
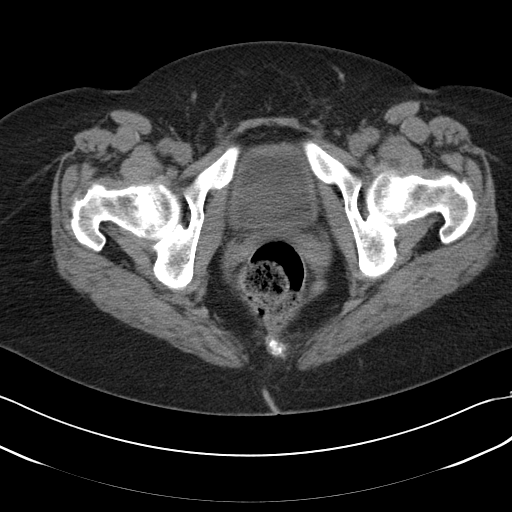
[im 12/125  bone]
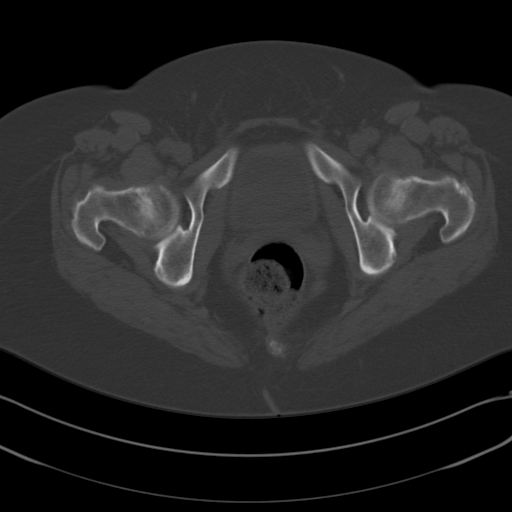
[im 23/125  soft-tissue]
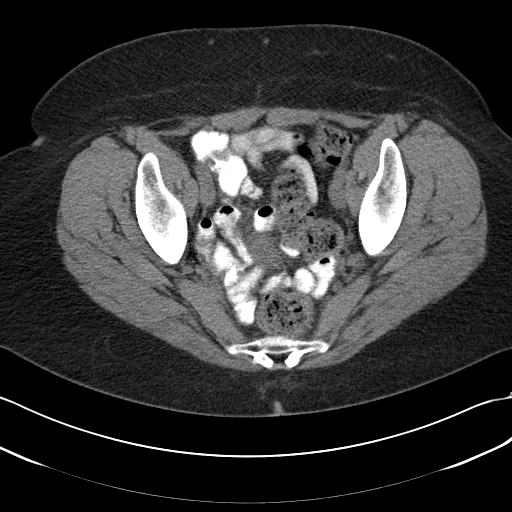
[im 34/125  soft-tissue]
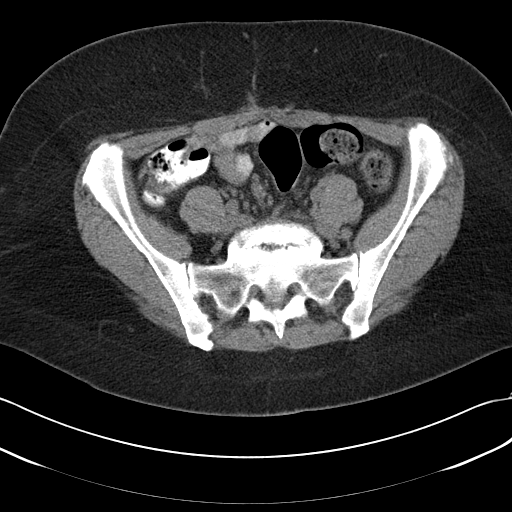
[im 46/125  soft-tissue]
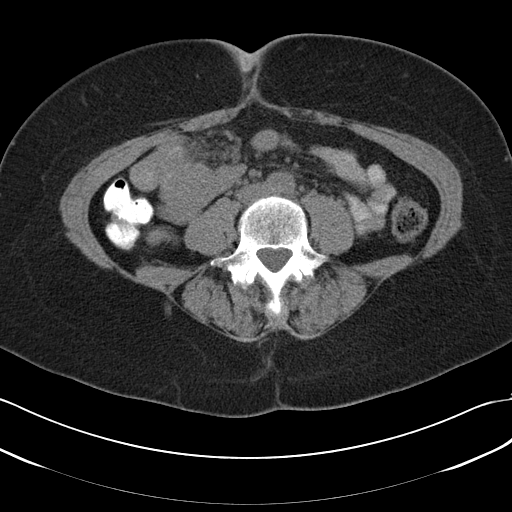
[im 68/125  soft-tissue]
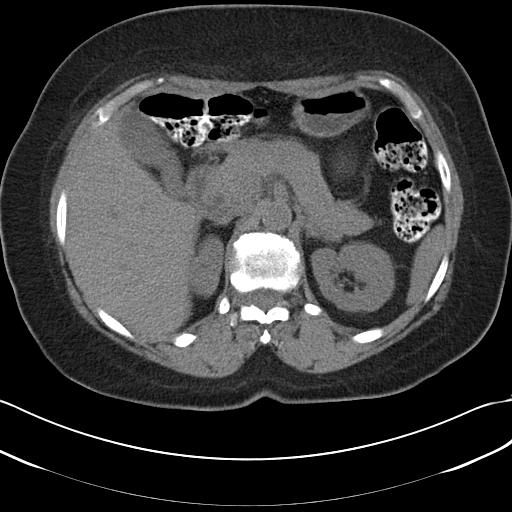
[im 79/125  soft-tissue]
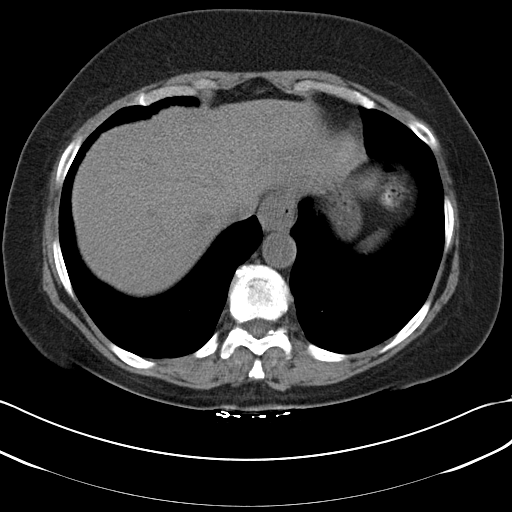
[im 79/125  lung]
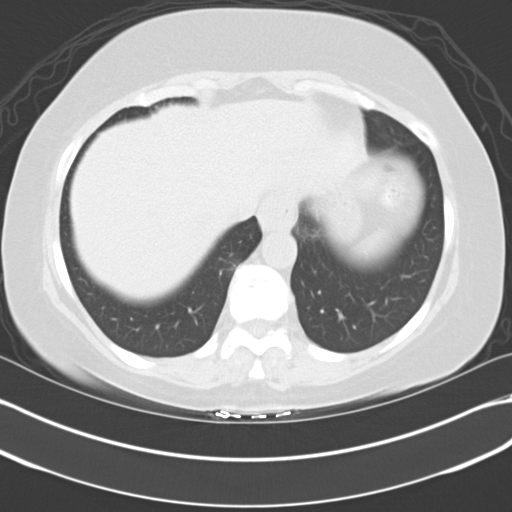
[im 91/125  soft-tissue]
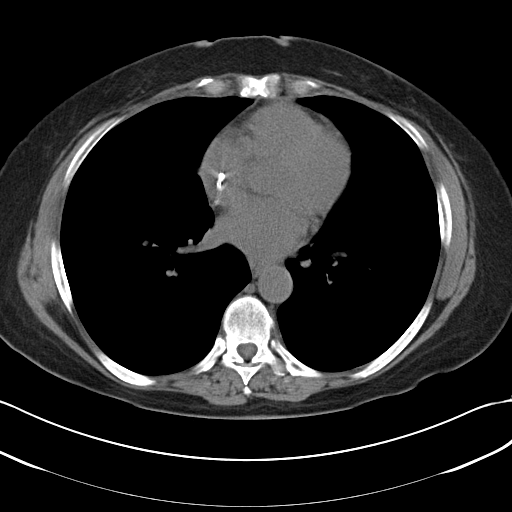
[im 91/125  lung]
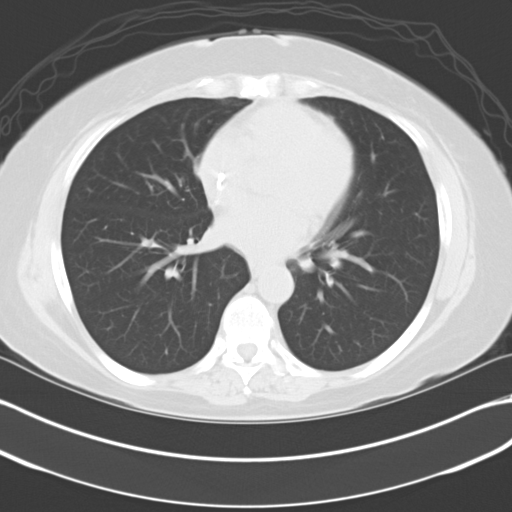
[im 102/125  soft-tissue]
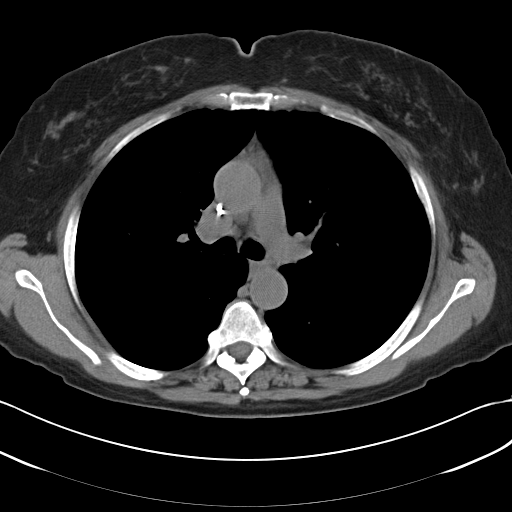
[im 102/125  lung]
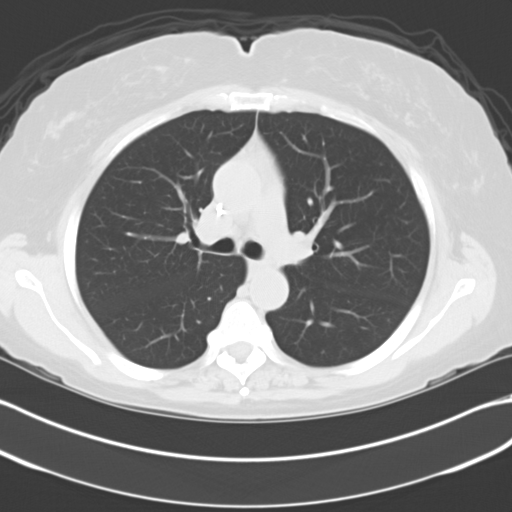
[im 113/125  soft-tissue]
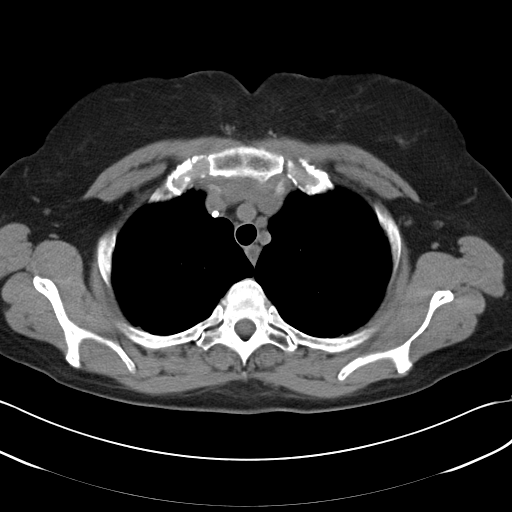
[im 113/125  lung]
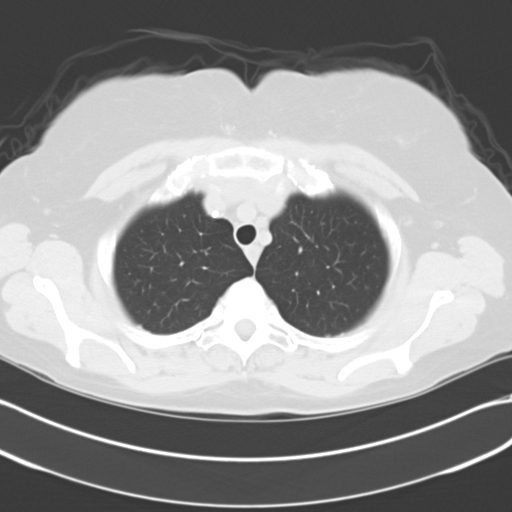
[im 113/125  bone]
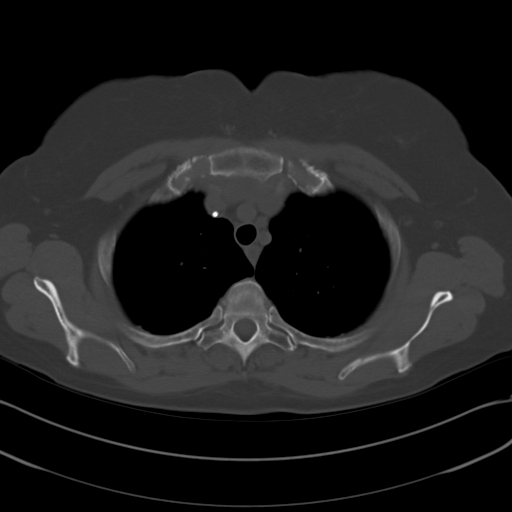

[Series 603: coronal images · coronal · 0.45mm/px · 2 of 64 slices shown, 3 images]
[im 22/64  soft-tissue]
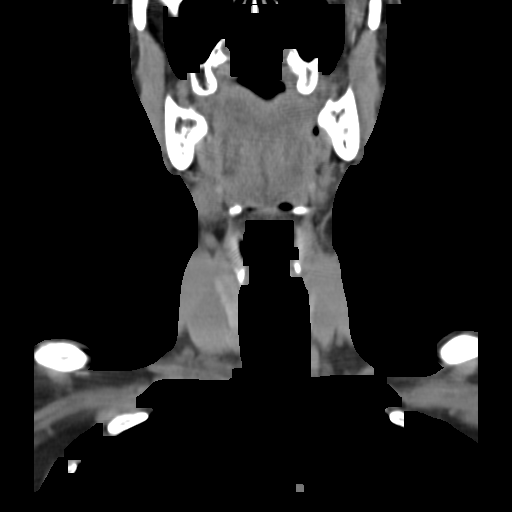
[im 22/64  bone]
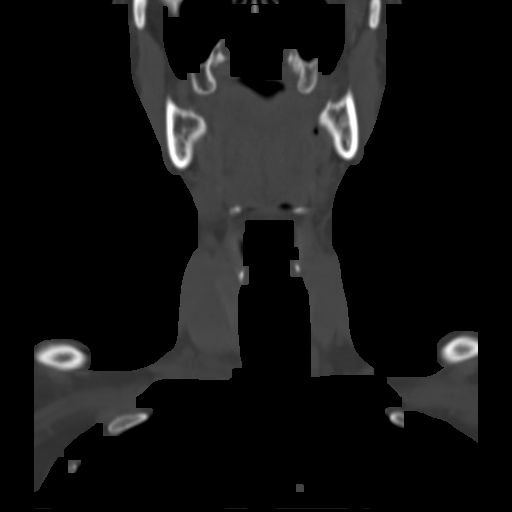
[im 43/64  soft-tissue]
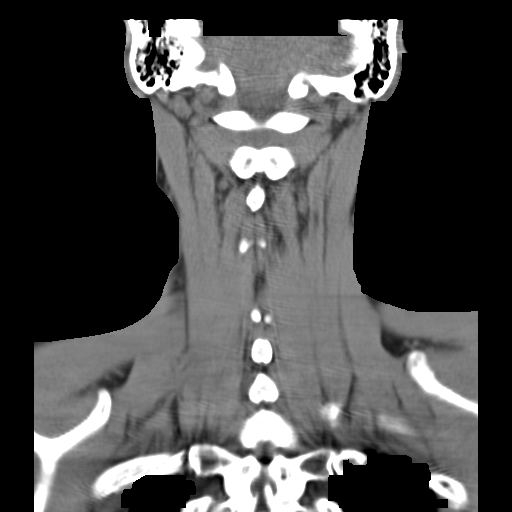

[11 of 46 positions shown; findings below may reference images not displayed]

FINDINGS: No pathologically enlarged lymph nodes or other soft
tissue masses are identified within the neck.  The pharynx and
parapharyngeal spaces are symmetric and normal in appearance.
Larynx also appears normal.  Thyroid and salivary glands are
unremarkable in appearance on this noncontrast study.  No evidence
of inflammatory process or abnormal fluid collections.
IMPRESSION: Stable exam.  No evidence of cervical lymphadenopathy or other
significant abnormality.

CT CHEST
FINDINGS: No evidence of mediastinal or hilar masses on this
noncontrast study.  No adenopathy seen elsewhere within the thorax.
No evidence of chest wall mass.  No evidence of pleural or
pericardial effusion.

Both lungs are clear.  No evidence of pulmonary infiltrate or
central endobronchial lesion. Tiny 4 mm pulmonary nodule in the
posteromedial right upper lobe on image 22 remains stable.  No
other suspicious pulmonary nodules or masses are identified.
IMPRESSION: Stable exam.  No evidence of recurrent lymphoma within the thorax.

CT ABDOMEN AND PELVIS
FINDINGS: The abdominal parenchymal organs are unremarkable in
appearance on this noncontrast study.  Gallbladder is unremarkable.
No evidence of hydronephrosis.

Surgical staples again noted within the right abdomen.  Evaluation
is somewhat limited due to lack of small bowel opacification by
oral contrast material. Several small bowel loops in the right
abdomen show increased soft tissue prominence and this may
represent increased small bowel wall thickening.

Shotty mesenteric lymphadenopathy is seen in the right lower
quadrant with largest lymph node measuring 14 mm on image 78.  Soft
tissue density is also seen in the central pelvic mesentery,
measuring 2.2 x 2.5 cm on image 102.  These show no significant
change in size when compared with previous study.  Previous
hysterectomy noted.  Adnexal regions are otherwise unremarkable.
There is no evidence of bowel obstruction.
IMPRESSION: 1.  Evaluation of small bowel is suboptimal due to lack of oral
contrast opacification.  Multiple right abdominal small bowel loops
show increased soft tissue prominence. Increased small bowel wall
thickening secondary to lymphoma cannot be excluded; CT or MR
enterography could be obtained for further evaluation if clinically
warranted.
2. Mild right abdominal and pelvic mesenteric lymphadenopathy,
without significant change.

## 2011-07-08 ENCOUNTER — Encounter: Payer: Self-pay | Admitting: *Deleted

## 2011-07-08 ENCOUNTER — Ambulatory Visit (HOSPITAL_BASED_OUTPATIENT_CLINIC_OR_DEPARTMENT_OTHER): Payer: BC Managed Care – PPO | Admitting: Oncology

## 2011-07-08 VITALS — BP 136/77 | HR 73 | Temp 97.3°F | Ht 66.0 in | Wt 193.0 lb

## 2011-07-08 DIAGNOSIS — Z452 Encounter for adjustment and management of vascular access device: Secondary | ICD-10-CM

## 2011-07-08 DIAGNOSIS — C8583 Other specified types of non-Hodgkin lymphoma, intra-abdominal lymph nodes: Secondary | ICD-10-CM

## 2011-07-08 NOTE — Progress Notes (Signed)
Hematology and Oncology Follow Up Visit  Christine Morales [id number] 01-Feb-1946 66 y.o. 07/08/2011 12:19 PM  CC: Christine Quint. Plotnikov, MD    Principle Diagnosis: This is a 66 year old female with follicular lymphoma, non-Hodgkin type, stage IIA, presented with a large mesenteric mass in July 2011.    Prior Therapy: The patient received 5 cycles of bendamustine and rituximab, finished in December 2011.  She had a complete response at that time.   Current therapy: Observation and surveillance.    Interim History:  Christine Morales presents today for a followup visit.  A very pleasant 66 year old female with diagnosis of follicular lymphoma, treated with bendamustine and rituximab as mentioned.  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  Clinically since the last time I saw her, since the conclusion of her chemotherapy, she had been doing very well.  She did not report any nausea, did not report any vomiting.  Had not reported any specific complaints.  Did not report any fevers, did not report any chills.  She has gained her weight back that she had lost throughout the process and overall performing activities of daily living without any major hindrance or decline.   Medications: I have reviewed the patient's current medications. Current outpatient prescriptions:amLODipine (NORVASC) 5 MG tablet, Take 1 tablet (5 mg total) by mouth daily., Disp: 90 tablet, Rfl: 3;  Cholecalciferol (VITAMIN D3) 1000 UNITS tablet, Take 1,000 Units by mouth daily.  , Disp: , Rfl: ;  cyanocobalamin (,VITAMIN B-12,) 1000 MCG/ML injection, Inject 1 mL (1,000 mcg total) into the muscle once., Disp: 10 mL, Rfl: 6 ferrous sulfate (FERRO-BOB) 325 (65 FE) MG tablet, Take 325 mg by mouth daily with breakfast.  , Disp: , Rfl: ;  losartan (COZAAR) 100 MG tablet, Take 100 mg by mouth daily.  , Disp: , Rfl: ;   Syringe/Needle, Disp, (B-D ECLIPSE SYRINGE) 30G X 1/2" 1 ML MISC, 1 each by Does not apply route 1 day or 1 dose., Disp: 50 each, Rfl: 11  Allergies: No Known Allergies  Past Medical History, Surgical history, Social history, and Family History were reviewed and updated.  Review of Systems: Constitutional:  Negative for fever, chills, night sweats, anorexia, weight loss, pain. Cardiovascular: no chest pain or dyspnea on exertion Respiratory: no cough, shortness of breath, or wheezing Neurological: no TIA or stroke symptoms Dermatological: negative ENT: negative Skin: Negative. Gastrointestinal: no abdominal pain, change in bowel habits, or black or bloody stools Genito-Urinary: no dysuria, trouble voiding, or hematuria Hematological and Lymphatic: negative Breast: negative Musculoskeletal: negative Remaining ROS negative. Physical Exam: Blood pressure 136/77, pulse 73, temperature 97.3 F (36.3 C), temperature source Oral, height 5\' 6"  (1.676 m), weight 193 lb (87.544 kg). ECOG:  General appearance: alert Head: Normocephalic, without obvious abnormality, atraumatic Neck: no adenopathy, no carotid bruit, no JVD, supple, symmetrical, trachea midline and thyroid not enlarged, symmetric, no tenderness/mass/nodules Lymph nodes: Cervical, supraclavicular, and axillary nodes normal. Heart:regular rate and rhythm, S1, S2 normal, no murmur, click, rub or gallop Lung:chest clear, no wheezing, rales, normal symmetric air entry Abdomin: soft, non-tender, without masses or organomegaly EXT:no erythema, induration, or nodules   Lab Results: Lab Results  Component Value Date   WBC 4.7 07/01/2011   HGB 11.1* 07/01/2011   HCT 32.9* 07/01/2011   MCV 90.1 07/01/2011   PLT 226 07/01/2011     Chemistry  Component Value Date/Time   NA 148* 07/01/2011 0848   NA 142 03/20/2011 1623   K 5.1* 07/01/2011 0848   K 4.8 03/20/2011 1623   CL 104 07/01/2011 0848   CL 109 03/20/2011 1623   CO2 30 07/01/2011 0848     CO2 24 03/20/2011 1623   BUN 27* 07/01/2011 0848   BUN 46* 03/20/2011 1623   CREATININE 2.2* 07/01/2011 0848   CREATININE 2.4* 03/20/2011 1623   GLU 87 01/23/2010 1223      Component Value Date/Time   CALCIUM 9.3 07/01/2011 0848   CALCIUM 8.8 03/20/2011 1623   ALKPHOS 119* 07/01/2011 0848   ALKPHOS 107 03/11/2011 1119   AST 17 07/01/2011 0848   AST 15 03/11/2011 1119   ALT 9 03/11/2011 1119   BILITOT 0.60 07/01/2011 0848   BILITOT 0.4 03/11/2011 1119       Radiological Studies: CT NECK, CHEST, ABDOMEN AND PELVIS WITHOUT CONTRAST  Technique: Multidetector CT imaging of the neck, chest, abdomen  and pelvis was performed using the standard protocol without  intravenous contrast.  Comparison: 11/05/2010  CT NECK  Findings: No pathologically enlarged lymph nodes or other soft  tissue masses are identified within the neck. The pharynx and  parapharyngeal spaces are symmetric and normal in appearance.  Larynx also appears normal. Thyroid and salivary glands are  unremarkable in appearance on this noncontrast study. No evidence  of inflammatory process or abnormal fluid collections.  IMPRESSION:  Stable exam. No evidence of cervical lymphadenopathy or other  significant abnormality.  CT CHEST  Findings: No evidence of mediastinal or hilar masses on this  noncontrast study. No adenopathy seen elsewhere within the thorax.  No evidence of chest wall mass. No evidence of pleural or  pericardial effusion.  Both lungs are clear. No evidence of pulmonary infiltrate or  central endobronchial lesion. Tiny 4 mm pulmonary nodule in the  posteromedial right upper lobe on image 22 remains stable. No  other suspicious pulmonary nodules or masses are identified.  IMPRESSION:  Stable exam. No evidence of recurrent lymphoma within the thorax.  CT ABDOMEN AND PELVIS  Findings: The abdominal parenchymal organs are unremarkable in  appearance on this noncontrast study. Gallbladder is unremarkable.  No evidence  of hydronephrosis.  Surgical staples again noted within the right abdomen. Evaluation  is somewhat limited due to lack of small bowel opacification by  oral contrast material. Several small bowel loops in the right  abdomen show increased soft tissue prominence and this may  represent increased small bowel wall thickening.  Shotty mesenteric lymphadenopathy is seen in the right lower  quadrant with largest lymph node measuring 14 mm on image 78. Soft  tissue density is also seen in the central pelvic mesentery,  measuring 2.2 x 2.5 cm on image 102. These show no significant  change in size when compared with previous study. Previous  hysterectomy noted. Adnexal regions are otherwise unremarkable.  There is no evidence of bowel obstruction.  IMPRESSION:  1. Evaluation of small bowel is suboptimal due to lack of oral  contrast opacification. Multiple right abdominal small bowel loops  show increased soft tissue prominence. Increased small bowel wall  thickening secondary to lymphoma cannot be excluded; CT or MR  enterography could be obtained for further evaluation if clinically  warranted.  2. Mild right abdominal and pelvic mesenteric lymphadenopathy,  without significant change.     Impression and Plan:  This is a pleasant 66 year old female with the following issues:  1. Follicular  lymphoma, stage IIA disease, presenting with a large mesenteric mass, status post systemic chemotherapy completed in December 2011.  She is currently on active surveillance.  CT scans appear stable. No need to start chemotherapy at this time.  2. Port-A-Cath management.  We will flush her Port-A-Cath today and potentially remove it in the future.  3. Pancytopenia due to chemotherapy.  That is resolved.     [facility], MD 1/15/201312:19 PM

## 2011-07-08 NOTE — Progress Notes (Signed)
Port flushed per protocol, per dr Clelia Croft. Excellent blood return, patient tolerated well.

## 2011-07-10 ENCOUNTER — Other Ambulatory Visit: Payer: BC Managed Care – PPO

## 2011-07-10 DIAGNOSIS — E538 Deficiency of other specified B group vitamins: Secondary | ICD-10-CM

## 2011-07-11 ENCOUNTER — Ambulatory Visit: Payer: BC Managed Care – PPO | Admitting: Internal Medicine

## 2011-08-19 ENCOUNTER — Ambulatory Visit: Payer: BC Managed Care – PPO

## 2011-08-19 MED ORDER — SODIUM CHLORIDE 0.9 % IJ SOLN
10.0000 mL | INTRAMUSCULAR | Status: AC | PRN
  Filled 2011-08-19: qty 10

## 2011-08-19 MED ORDER — HEPARIN SOD (PORK) LOCK FLUSH 100 UNIT/ML IV SOLN
500.0000 [IU] | Freq: Once | INTRAVENOUS | Status: AC
  Filled 2011-08-19: qty 5

## 2011-09-02 ENCOUNTER — Other Ambulatory Visit (INDEPENDENT_AMBULATORY_CARE_PROVIDER_SITE_OTHER): Payer: BC Managed Care – PPO

## 2011-09-02 DIAGNOSIS — R634 Abnormal weight loss: Secondary | ICD-10-CM

## 2011-09-02 DIAGNOSIS — Z0000 Encounter for general adult medical examination without abnormal findings: Secondary | ICD-10-CM

## 2011-09-02 DIAGNOSIS — D509 Iron deficiency anemia, unspecified: Secondary | ICD-10-CM

## 2011-09-02 DIAGNOSIS — C8583 Other specified types of non-Hodgkin lymphoma, intra-abdominal lymph nodes: Secondary | ICD-10-CM

## 2011-09-02 LAB — CBC WITH DIFFERENTIAL/PLATELET
Basophils Relative: 0.3 % (ref 0.0–3.0)
Eosinophils Relative: 1.8 % (ref 0.0–5.0)
Hemoglobin: 10.3 g/dL — ABNORMAL LOW (ref 12.0–15.0)
Lymphocytes Relative: 35 % (ref 12.0–46.0)
Monocytes Relative: 10.6 % (ref 3.0–12.0)
Neutro Abs: 2.1 10*3/uL (ref 1.4–7.7)
RBC: 3.41 Mil/uL — ABNORMAL LOW (ref 3.87–5.11)
WBC: 4.1 10*3/uL — ABNORMAL LOW (ref 4.5–10.5)

## 2011-09-02 LAB — URINALYSIS, ROUTINE W REFLEX MICROSCOPIC
Bilirubin Urine: NEGATIVE
Ketones, ur: NEGATIVE
Nitrite: NEGATIVE
Total Protein, Urine: NEGATIVE

## 2011-09-02 LAB — COMPREHENSIVE METABOLIC PANEL
Albumin: 3.8 g/dL (ref 3.5–5.2)
BUN: 36 mg/dL — ABNORMAL HIGH (ref 6–23)
Calcium: 8.9 mg/dL (ref 8.4–10.5)
Chloride: 106 mEq/L (ref 96–112)
Glucose, Bld: 91 mg/dL (ref 70–99)
Potassium: 4.4 mEq/L (ref 3.5–5.1)
Sodium: 139 mEq/L (ref 135–145)
Total Protein: 6.4 g/dL (ref 6.0–8.3)

## 2011-09-02 LAB — LIPID PANEL
Cholesterol: 180 mg/dL (ref 0–200)
LDL Cholesterol: 97 mg/dL (ref 0–99)
Total CHOL/HDL Ratio: 3

## 2011-09-02 LAB — VITAMIN B12: Vitamin B-12: 1045 pg/mL — ABNORMAL HIGH (ref 211–911)

## 2011-09-03 ENCOUNTER — Encounter: Payer: Self-pay | Admitting: Internal Medicine

## 2011-09-03 ENCOUNTER — Ambulatory Visit (INDEPENDENT_AMBULATORY_CARE_PROVIDER_SITE_OTHER): Payer: BC Managed Care – PPO | Admitting: Internal Medicine

## 2011-09-03 VITALS — BP 130/80 | HR 80 | Temp 97.2°F | Resp 16 | Wt 203.0 lb

## 2011-09-03 DIAGNOSIS — I10 Essential (primary) hypertension: Secondary | ICD-10-CM

## 2011-09-03 DIAGNOSIS — E538 Deficiency of other specified B group vitamins: Secondary | ICD-10-CM

## 2011-09-03 DIAGNOSIS — C8583 Other specified types of non-Hodgkin lymphoma, intra-abdominal lymph nodes: Secondary | ICD-10-CM

## 2011-09-03 DIAGNOSIS — D509 Iron deficiency anemia, unspecified: Secondary | ICD-10-CM

## 2011-09-03 DIAGNOSIS — N189 Chronic kidney disease, unspecified: Secondary | ICD-10-CM | POA: Insufficient documentation

## 2011-09-03 NOTE — Assessment & Plan Note (Signed)
2011 Non-Hodgkin

## 2011-09-03 NOTE — Assessment & Plan Note (Signed)
Watching  

## 2011-09-03 NOTE — Assessment & Plan Note (Signed)
2012 Risks associated with treatment noncompliance were discussed. Compliance was encouraged. Shots q 1 mo

## 2011-09-03 NOTE — Assessment & Plan Note (Signed)
Nephrol cons suggested - she will think about it

## 2011-09-03 NOTE — Assessment & Plan Note (Signed)
Continue with current prescription therapy as reflected on the Med list.  

## 2011-09-03 NOTE — Progress Notes (Signed)
Patient ID: Christine Morales, female   DOB: 1945/09/19, 66 y.o.   MRN: [id number]  Subjective:    Patient ID: Christine Morales, female    DOB: 09-12-1945, 66 y.o.   MRN: [id number]  HPI  The patient presents for a follow-up of  chronic hypertension, anemia, non-H  Lymphoma, vit B12 def controlled with medicines F/u on wt loss   Wt Readings from Last 3 Encounters:  09/03/11 203 lb (92.08 kg)  07/08/11 193 lb (87.544 kg)  03/11/11 162 lb 9 oz (73.738 kg)   BP Readings from Last 3 Encounters:  09/03/11 130/80  07/08/11 136/77  03/11/11 115/69      Review of Systems  Constitutional: Negative for chills, activity change, appetite change, fatigue and unexpected weight change.  HENT: Negative for congestion, mouth sores and sinus pressure.   Eyes: Negative for visual disturbance.  Respiratory: Negative for cough and chest tightness.   Gastrointestinal: Negative for nausea and abdominal pain.  Genitourinary: Negative for frequency, difficulty urinating and vaginal pain.  Musculoskeletal: Negative for back pain and gait problem.  Skin: Negative for pallor and rash.  Neurological: Negative for dizziness, tremors, weakness, numbness and headaches.  Psychiatric/Behavioral: Negative for confusion and sleep disturbance.       Objective:   Physical Exam  Constitutional: She appears well-developed and well-nourished. No distress.  HENT:  Head: Normocephalic.  Right Ear: External ear normal.  Left Ear: External ear normal.  Nose: Nose normal.  Mouth/Throat: Oropharynx is clear and moist.  Eyes: Conjunctivae are normal. Pupils are equal, round, and reactive to light. Right eye exhibits no discharge. Left eye exhibits no discharge.  Neck: Normal range of motion. Neck supple. No JVD present. No tracheal deviation present. No thyromegaly present.  Cardiovascular: Normal rate, regular rhythm and normal heart sounds.   Pulmonary/Chest: No stridor. No respiratory distress. She has no wheezes.    Abdominal: Soft. Bowel sounds are normal. She exhibits no distension and no mass. There is no tenderness. There is no rebound and no guarding.  Musculoskeletal: She exhibits no edema and no tenderness.  Lymphadenopathy:    She has no cervical adenopathy.  Neurological: She displays normal reflexes. No cranial nerve deficit. She exhibits normal muscle tone. Coordination normal.  Skin: No rash noted. No erythema.  Psychiatric: She has a normal mood and affect. Her behavior is normal. Judgment and thought content normal.     Lab Results  Component Value Date   WBC 4.1* 09/02/2011   HGB 10.3* 09/02/2011   HCT 30.8* 09/02/2011   PLT 199.0 09/02/2011   CHOL 180 09/02/2011   TRIG 129.0 09/02/2011   HDL 57.40 09/02/2011   ALT 13 09/02/2011   AST 19 09/02/2011   NA 139 09/02/2011   K 4.4 09/02/2011   CL 106 09/02/2011   CREATININE 2.3* 09/02/2011   BUN 36* 09/02/2011   CO2 25 09/02/2011   TSH 2.11 09/02/2011   INR 1.08 01/27/2010        Assessment & Plan:

## 2011-09-10 ENCOUNTER — Ambulatory Visit (HOSPITAL_BASED_OUTPATIENT_CLINIC_OR_DEPARTMENT_OTHER): Payer: BC Managed Care – PPO

## 2011-09-10 VITALS — BP 136/79 | HR 72 | Temp 97.9°F

## 2011-09-10 DIAGNOSIS — B370 Candidal stomatitis: Secondary | ICD-10-CM

## 2011-09-10 DIAGNOSIS — R109 Unspecified abdominal pain: Secondary | ICD-10-CM

## 2011-09-10 DIAGNOSIS — R198 Other specified symptoms and signs involving the digestive system and abdomen: Secondary | ICD-10-CM

## 2011-09-10 DIAGNOSIS — K921 Melena: Secondary | ICD-10-CM

## 2011-09-10 DIAGNOSIS — A048 Other specified bacterial intestinal infections: Secondary | ICD-10-CM

## 2011-09-10 DIAGNOSIS — R1013 Epigastric pain: Secondary | ICD-10-CM

## 2011-09-10 DIAGNOSIS — I10 Essential (primary) hypertension: Secondary | ICD-10-CM

## 2011-09-10 DIAGNOSIS — E538 Deficiency of other specified B group vitamins: Secondary | ICD-10-CM

## 2011-09-10 DIAGNOSIS — C8588 Other specified types of non-Hodgkin lymphoma, lymph nodes of multiple sites: Secondary | ICD-10-CM

## 2011-09-10 DIAGNOSIS — J019 Acute sinusitis, unspecified: Secondary | ICD-10-CM

## 2011-09-10 DIAGNOSIS — R61 Generalized hyperhidrosis: Secondary | ICD-10-CM

## 2011-09-10 DIAGNOSIS — Z9189 Other specified personal risk factors, not elsewhere classified: Secondary | ICD-10-CM

## 2011-09-10 DIAGNOSIS — D509 Iron deficiency anemia, unspecified: Secondary | ICD-10-CM

## 2011-09-10 DIAGNOSIS — K649 Unspecified hemorrhoids: Secondary | ICD-10-CM

## 2011-09-10 DIAGNOSIS — R634 Abnormal weight loss: Secondary | ICD-10-CM

## 2011-09-10 DIAGNOSIS — M549 Dorsalgia, unspecified: Secondary | ICD-10-CM

## 2011-09-10 DIAGNOSIS — J309 Allergic rhinitis, unspecified: Secondary | ICD-10-CM

## 2011-09-10 DIAGNOSIS — M79609 Pain in unspecified limb: Secondary | ICD-10-CM

## 2011-09-10 DIAGNOSIS — C8583 Other specified types of non-Hodgkin lymphoma, intra-abdominal lymph nodes: Secondary | ICD-10-CM

## 2011-09-10 DIAGNOSIS — Z452 Encounter for adjustment and management of vascular access device: Secondary | ICD-10-CM

## 2011-09-10 DIAGNOSIS — R110 Nausea: Secondary | ICD-10-CM

## 2011-09-10 MED ORDER — SODIUM CHLORIDE 0.9 % IJ SOLN
10.0000 mL | INTRAMUSCULAR | Status: DC | PRN
  Administered 2011-09-10: 10 mL via INTRAVENOUS
  Filled 2011-09-10: qty 10

## 2011-09-10 MED ORDER — HEPARIN SOD (PORK) LOCK FLUSH 100 UNIT/ML IV SOLN
500.0000 [IU] | Freq: Once | INTRAVENOUS | Status: AC | PRN
  Administered 2011-09-10: 500 [IU] via INTRAVENOUS
  Filled 2011-09-10: qty 5

## 2011-09-15 ENCOUNTER — Telehealth: Payer: Self-pay | Admitting: *Deleted

## 2011-09-15 DIAGNOSIS — Z0000 Encounter for general adult medical examination without abnormal findings: Secondary | ICD-10-CM

## 2011-09-15 NOTE — Telephone Encounter (Signed)
Labs entered for 02/2012 CPE.

## 2011-09-26 ENCOUNTER — Other Ambulatory Visit: Payer: Self-pay | Admitting: *Deleted

## 2011-09-26 MED ORDER — LOSARTAN POTASSIUM 100 MG PO TABS: 100.0000 mg | ORAL_TABLET | Freq: Every day | ORAL | Status: DC

## 2011-10-06 ENCOUNTER — Ambulatory Visit (INDEPENDENT_AMBULATORY_CARE_PROVIDER_SITE_OTHER): Payer: BC Managed Care – PPO | Admitting: Internal Medicine

## 2011-10-06 ENCOUNTER — Encounter: Payer: Self-pay | Admitting: Internal Medicine

## 2011-10-06 VITALS — BP 118/72 | HR 73 | Temp 97.0°F | Resp 16 | Ht 68.0 in | Wt 207.0 lb

## 2011-10-06 DIAGNOSIS — R200 Anesthesia of skin: Secondary | ICD-10-CM

## 2011-10-06 DIAGNOSIS — R209 Unspecified disturbances of skin sensation: Secondary | ICD-10-CM

## 2011-10-06 MED ORDER — VITAMIN B-6 100 MG PO TABS: 100.0000 mg | ORAL_TABLET | Freq: Every day | ORAL | Status: DC

## 2011-10-06 NOTE — Progress Notes (Signed)
  Subjective:    Patient ID: Christine Morales, female    DOB: April 16, 1946, 66 y.o.   MRN: [id number]  HPI  Complains of left upper extremity numbness Onset 1-2 weeks ago, intermittently present symptoms  Lasts only seconds (<3) at a time - but several episodes each day Associated with weakness sensation of left hand/grip Denies neck pain or balance problems Symptoms unchanged by position, activity or time of day  Past Medical History  Diagnosis Date  . Hypertension   . Anemia     iron def  . Ulcer     peptic  . Rhinitis, allergic   . Helicobacter pylori ab+   . Hemorrhoids   . 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 nhl dx'd 12/2009    chemo comp 2011    Review of Systems  Constitutional: Negative for fever, fatigue and unexpected weight change.  Respiratory: Negative for cough and shortness of breath.   Cardiovascular: Negative for chest pain and leg swelling.  Musculoskeletal: Negative for myalgias, back pain, joint swelling and gait problem.       Objective:   Physical Exam BP 118/72  Pulse 73  Temp(Src) 97 F (36.1 C) (Oral)  Resp 16  Ht 5\' 8"  (1.727 m)  Wt 207 lb (93.895 kg)  BMI 31.47 kg/m2  SpO2 99% Wt Readings from Last 3 Encounters:  10/06/11 207 lb (93.895 kg)  09/03/11 203 lb (92.08 kg)  07/08/11 193 lb (87.544 kg)   Constitutional: She appears well-developed and well-nourished. No distress.  Neck: Normal range of motion. Neck supple. No JVD present. No thyromegaly present.  Cardiovascular: Normal rate, regular rhythm and normal heart sounds.  No murmur heard. No BLE edema. Pulmonary/Chest: Effort normal and breath sounds normal. No respiratory distress. She has no wheezes.  Musculoskeletal: Normal range of motion, no joint effusions. No gross deformities Neurological: She is alert and oriented to person, place, and time. No cranial nerve deficit. Coordination normal.  Skin: Skin is warm and dry. No  rash noted. No erythema.  Psychiatric: She has a normal mood and affect. Her behavior is normal. Judgment and thought content normal.   Lab Results  Component Value Date   WBC 4.1* 09/02/2011   HGB 10.3* 09/02/2011   HCT 30.8* 09/02/2011   PLT 199.0 09/02/2011   GLUCOSE 91 09/02/2011   CHOL 180 09/02/2011   TRIG 129.0 09/02/2011   HDL 57.40 09/02/2011   LDLCALC 97 09/02/2011   ALT 13 09/02/2011   AST 19 09/02/2011   NA 139 09/02/2011   K 4.4 09/02/2011   CL 106 09/02/2011   CREATININE 2.3* 09/02/2011   BUN 36* 09/02/2011   CO2 25 09/02/2011   TSH 2.11 09/02/2011   INR 1.08 01/27/2010   Lab Results  Component Value Date   VITAMINB12 1045* 09/02/2011      Assessment & Plan:  Intermittent left upper extremity numbness. Neuro vascular exam normal today. Musculoskeletal exam also benign. We'll check nerve conduction study and recommend OTC vitamin B6 supplement pending these results  -

## 2011-10-06 NOTE — Patient Instructions (Signed)
It was good to see you today. we'll make referral for nerve conduction study test . Our office will contact you regarding appointment(s) once made. In the meanwhile,begin Vitamin B6 100 mg daily, over-the-counter supplement If symptoms worse, call here or go to the emergency room for reevaluation if symptoms pending further tests

## 2011-10-08 ENCOUNTER — Other Ambulatory Visit (HOSPITAL_BASED_OUTPATIENT_CLINIC_OR_DEPARTMENT_OTHER): Payer: BC Managed Care – PPO

## 2011-10-08 ENCOUNTER — Telehealth: Payer: Self-pay | Admitting: Oncology

## 2011-10-08 ENCOUNTER — Ambulatory Visit (HOSPITAL_BASED_OUTPATIENT_CLINIC_OR_DEPARTMENT_OTHER): Payer: BC Managed Care – PPO | Admitting: Oncology

## 2011-10-08 VITALS — BP 132/85 | HR 75 | Temp 96.9°F | Ht 68.0 in | Wt 208.4 lb

## 2011-10-08 DIAGNOSIS — D61818 Other pancytopenia: Secondary | ICD-10-CM

## 2011-10-08 DIAGNOSIS — C8583 Other specified types of non-Hodgkin lymphoma, intra-abdominal lymph nodes: Secondary | ICD-10-CM

## 2011-10-08 DIAGNOSIS — D509 Iron deficiency anemia, unspecified: Secondary | ICD-10-CM

## 2011-10-08 DIAGNOSIS — C8293 Follicular lymphoma, unspecified, intra-abdominal lymph nodes: Secondary | ICD-10-CM

## 2011-10-08 DIAGNOSIS — D649 Anemia, unspecified: Secondary | ICD-10-CM

## 2011-10-08 LAB — COMPREHENSIVE METABOLIC PANEL
Albumin: 4.1 g/dL (ref 3.5–5.2)
Alkaline Phosphatase: 130 U/L — ABNORMAL HIGH (ref 39–117)
BUN: 31 mg/dL — ABNORMAL HIGH (ref 6–23)
CO2: 22 mEq/L (ref 19–32)
Calcium: 8.8 mg/dL (ref 8.4–10.5)
Chloride: 110 mEq/L (ref 96–112)
Glucose, Bld: 89 mg/dL (ref 70–99)
Potassium: 4.5 mEq/L (ref 3.5–5.3)
Sodium: 141 mEq/L (ref 135–145)
Total Protein: 6.4 g/dL (ref 6.0–8.3)

## 2011-10-08 LAB — CBC WITH DIFFERENTIAL/PLATELET
Basophils Absolute: 0 10*3/uL (ref 0.0–0.1)
EOS%: 2.2 % (ref 0.0–7.0)
Eosinophils Absolute: 0.1 10*3/uL (ref 0.0–0.5)
HCT: 31.3 % — ABNORMAL LOW (ref 34.8–46.6)
HGB: 10.7 g/dL — ABNORMAL LOW (ref 11.6–15.9)
MCH: 30.3 pg (ref 25.1–34.0)
NEUT#: 1.9 10*3/uL (ref 1.5–6.5)
NEUT%: 49.6 % (ref 38.4–76.8)
RDW: 14.2 % (ref 11.2–14.5)
lymph#: 1.4 10*3/uL (ref 0.9–3.3)

## 2011-10-08 LAB — LACTATE DEHYDROGENASE: LDH: 185 U/L (ref 94–250)

## 2011-10-08 MED ORDER — SODIUM CHLORIDE 0.9 % IJ SOLN: 10.0000 mL | Freq: Once | INTRAMUSCULAR | Status: DC

## 2011-10-08 MED ORDER — HEPARIN SOD (PORK) LOCK FLUSH 100 UNIT/ML IV SOLN
500.0000 [IU] | Freq: Once | INTRAVENOUS | Status: AC
  Administered 2011-10-08: 500 [IU] via INTRAVENOUS

## 2011-10-08 NOTE — Telephone Encounter (Signed)
Gave pt appt for May and July. Pt will have CT , lab and flush in July and see Md after a few days. She was also given oral contrast and NPO after midnight

## 2011-10-08 NOTE — Progress Notes (Signed)
Hematology and Oncology Follow Up Visit  Von Inscoe [id number] 20-Nov-1945 66 y.o. 10/08/2011 8:59 AM  CC: Georgina Quint. Plotnikov, MD    Principle Diagnosis: This is a 66 year old female with follicular lymphoma, non-Hodgkin type, stage IIA, presented with a large mesenteric mass in July 2011.    Prior Therapy: The patient received 5 cycles of bendamustine and rituximab, finished in December 2011.  She had a complete response at that time.   Current therapy: Observation and surveillance.    Interim History:  Mrs. Tully presents today for a followup visit.  A very pleasant 66 year old female with diagnosis of follicular lymphoma, treated with bendamustine and rituximab as mentioned.  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  Clinically since the last time I saw her, she had been doing very well.  She did not report any nausea, did not report any vomiting.  Had not reported any specific complaints.  Did not report any fevers, did not report any chills.  She has gained her weight back that she had lost throughout the process and overall performing activities of daily living without any major hindrance or decline. She report mild fatigue, but continue to work full time without any decline.  Her weight is back to base line.    Medications: I have reviewed the patient's current medications. Current outpatient prescriptions:amLODipine (NORVASC) 5 MG tablet, Take 1 tablet (5 mg total) by mouth daily., Disp: 90 tablet, Rfl: 3;  Cholecalciferol (VITAMIN D3) 1000 UNITS tablet, Take 1,000 Units by mouth daily.  , Disp: , Rfl: ;  cyanocobalamin (,VITAMIN B-12,) 1000 MCG/ML injection, Inject 1 mL (1,000 mcg total) into the muscle once., Disp: 10 mL, Rfl: 6 ferrous sulfate (FERRO-BOB) 325 (65 FE) MG tablet, Take 325 mg by mouth daily with breakfast.  , Disp: , Rfl: ;  losartan  (COZAAR) 100 MG tablet, Take 1 tablet (100 mg total) by mouth daily., Disp: 30 tablet, Rfl: 5;  pyridOXINE (VITAMIN B-6) 100 MG tablet, Take 1 tablet (100 mg total) by mouth daily., Disp: , Rfl:  Syringe/Needle, Disp, (B-D ECLIPSE SYRINGE) 30G X 1/2" 1 ML MISC, 1 each by Does not apply route 1 day or 1 dose., Disp: 50 each, Rfl: 11 No current facility-administered medications for this visit. Facility-Administered Medications Ordered in Other Visits: heparin lock flush 100 unit/mL, 500 Units, Intravenous, Once, Benjiman Core, MD;  sodium chloride 0.9 % injection 10 mL, 10 mL, Intravenous, PRN, Benjiman Core, MD  Allergies: No Known Allergies  Past Medical History, Surgical history, Social history, and Family History were reviewed and updated.  Review of Systems: Constitutional:  Negative for fever, chills, night sweats, anorexia, weight loss, pain. Cardiovascular: no chest pain or dyspnea on exertion Respiratory: no cough, shortness of breath, or wheezing Neurological: no TIA or stroke symptoms Dermatological: negative ENT: negative Skin: Negative. Gastrointestinal: no abdominal pain, change in bowel habits, or black or bloody stools Genito-Urinary: no dysuria, trouble voiding, or hematuria Hematological and Lymphatic: negative Breast: negative Musculoskeletal: negative Remaining ROS negative. Physical Exam: Blood pressure 132/85, pulse 75, temperature 96.9 F (36.1 C), temperature source Oral, height 5\' 8"  (1.727 m), weight 208 lb 6.4 oz (94.53 kg). ECOG: 1 General appearance: alert Head: Normocephalic, without obvious abnormality, atraumatic Neck: no adenopathy, no carotid bruit, no JVD, supple, symmetrical, trachea midline and thyroid not enlarged, symmetric, no tenderness/mass/nodules Lymph nodes: Cervical, supraclavicular, and axillary  nodes normal. Heart:regular rate and rhythm, S1, S2 normal, no murmur, click, rub or gallop Lung:chest clear, no wheezing, rales, normal  symmetric air entry Abdomin: soft, non-tender, without masses or organomegaly EXT:no erythema, induration, or nodules   Lab Results: Lab Results  Component Value Date   WBC 3.9 10/08/2011   HGB 10.7* 10/08/2011   HCT 31.3* 10/08/2011   MCV 89.1 10/08/2011   PLT 216 10/08/2011     Chemistry      Component Value Date/Time   NA 139 09/02/2011 1120   NA 148* 07/01/2011 0848   K 4.4 09/02/2011 1120   K 5.1* 07/01/2011 0848   CL 106 09/02/2011 1120   CL 104 07/01/2011 0848   CO2 25 09/02/2011 1120   CO2 30 07/01/2011 0848   BUN 36* 09/02/2011 1120   BUN 27* 07/01/2011 0848   CREATININE 2.3* 09/02/2011 1120   CREATININE 2.2* 07/01/2011 0848   GLU 87 01/23/2010 1223      Component Value Date/Time   CALCIUM 8.9 09/02/2011 1120   CALCIUM 9.3 07/01/2011 0848   ALKPHOS 112 09/02/2011 1120   ALKPHOS 119* 07/01/2011 0848   AST 19 09/02/2011 1120   AST 17 07/01/2011 0848   ALT 13 09/02/2011 1120   BILITOT 0.4 09/02/2011 1120   BILITOT 0.60 07/01/2011 0848       Impression and Plan:  This is a pleasant 66 year old female with the following issues:  1. Follicular lymphoma, stage IIA disease, presenting with a large mesenteric mass, status post systemic chemotherapy completed in December 2011.  She is currently on active surveillance.  CT scans appear stable. The plan is to continue follow up and repeat imaging studies in 3 months.   2. Port-A-Cath management.  We will flush her Port-A-Cath today and every 6 weeks.  3. Pancytopenia due to chemotherapy.  That is resolved. 4. Mild Anemia: likely treatment related. It appears to be improving. She is on Vitamin supplements at this time.      [facility], MD 4/17/20138:59 AM

## 2011-10-08 NOTE — Progress Notes (Signed)
Addended by: Reesa Chew on: 10/08/2011 09:15 AM   Modules accepted: Orders

## 2011-10-22 ENCOUNTER — Telehealth: Payer: Self-pay | Admitting: *Deleted

## 2011-10-22 NOTE — Telephone Encounter (Signed)
MD received report back from nerve conduction test. Tried to call patient to let her know the results home # disconnected, cell # was not the correct #, work # states she doesn't work there. Will mail pt a result letter with md response concerning nerve conduction test... 10/22/11@1 :42pm/LMB

## 2011-11-19 ENCOUNTER — Ambulatory Visit (HOSPITAL_BASED_OUTPATIENT_CLINIC_OR_DEPARTMENT_OTHER): Payer: BC Managed Care – PPO

## 2011-11-19 VITALS — BP 118/72 | HR 75 | Temp 97.7°F

## 2011-11-19 DIAGNOSIS — R634 Abnormal weight loss: Secondary | ICD-10-CM

## 2011-11-19 DIAGNOSIS — Z9189 Other specified personal risk factors, not elsewhere classified: Secondary | ICD-10-CM

## 2011-11-19 DIAGNOSIS — M79609 Pain in unspecified limb: Secondary | ICD-10-CM

## 2011-11-19 DIAGNOSIS — R110 Nausea: Secondary | ICD-10-CM

## 2011-11-19 DIAGNOSIS — K921 Melena: Secondary | ICD-10-CM

## 2011-11-19 DIAGNOSIS — C8583 Other specified types of non-Hodgkin lymphoma, intra-abdominal lymph nodes: Secondary | ICD-10-CM

## 2011-11-19 DIAGNOSIS — E538 Deficiency of other specified B group vitamins: Secondary | ICD-10-CM

## 2011-11-19 DIAGNOSIS — I10 Essential (primary) hypertension: Secondary | ICD-10-CM

## 2011-11-19 DIAGNOSIS — R198 Other specified symptoms and signs involving the digestive system and abdomen: Secondary | ICD-10-CM

## 2011-11-19 DIAGNOSIS — A048 Other specified bacterial intestinal infections: Secondary | ICD-10-CM

## 2011-11-19 DIAGNOSIS — M549 Dorsalgia, unspecified: Secondary | ICD-10-CM

## 2011-11-19 DIAGNOSIS — J019 Acute sinusitis, unspecified: Secondary | ICD-10-CM

## 2011-11-19 DIAGNOSIS — D509 Iron deficiency anemia, unspecified: Secondary | ICD-10-CM

## 2011-11-19 DIAGNOSIS — K649 Unspecified hemorrhoids: Secondary | ICD-10-CM

## 2011-11-19 DIAGNOSIS — Z452 Encounter for adjustment and management of vascular access device: Secondary | ICD-10-CM

## 2011-11-19 DIAGNOSIS — R61 Generalized hyperhidrosis: Secondary | ICD-10-CM

## 2011-11-19 DIAGNOSIS — R1013 Epigastric pain: Secondary | ICD-10-CM

## 2011-11-19 DIAGNOSIS — J309 Allergic rhinitis, unspecified: Secondary | ICD-10-CM

## 2011-11-19 DIAGNOSIS — R109 Unspecified abdominal pain: Secondary | ICD-10-CM

## 2011-11-19 DIAGNOSIS — B370 Candidal stomatitis: Secondary | ICD-10-CM

## 2011-11-19 MED ORDER — SODIUM CHLORIDE 0.9 % IJ SOLN
10.0000 mL | INTRAMUSCULAR | Status: DC | PRN
  Administered 2011-11-19: 10 mL via INTRAVENOUS
  Filled 2011-11-19: qty 10

## 2011-11-19 MED ORDER — HEPARIN SOD (PORK) LOCK FLUSH 100 UNIT/ML IV SOLN
500.0000 [IU] | Freq: Once | INTRAVENOUS | Status: AC | PRN
  Administered 2011-11-19: 500 [IU] via INTRAVENOUS
  Filled 2011-11-19: qty 5

## 2011-12-11 ENCOUNTER — Other Ambulatory Visit: Payer: Self-pay | Admitting: *Deleted

## 2011-12-11 MED ORDER — AMLODIPINE BESYLATE 5 MG PO TABS: 5.0000 mg | ORAL_TABLET | Freq: Every day | ORAL | Status: DC

## 2012-01-07 ENCOUNTER — Ambulatory Visit (HOSPITAL_COMMUNITY)
Admission: RE | Admit: 2012-01-07 | Discharge: 2012-01-07 | Disposition: A | Discharge status: Home / Self Care | Payer: BC Managed Care – PPO | Source: Ambulatory Visit | Attending: Oncology | Admitting: Oncology

## 2012-01-07 ENCOUNTER — Ambulatory Visit: Payer: BC Managed Care – PPO

## 2012-01-07 ENCOUNTER — Other Ambulatory Visit (HOSPITAL_BASED_OUTPATIENT_CLINIC_OR_DEPARTMENT_OTHER): Payer: BC Managed Care – PPO | Admitting: Lab

## 2012-01-07 ENCOUNTER — Other Ambulatory Visit: Payer: Self-pay | Admitting: Oncology

## 2012-01-07 VITALS — BP 116/78 | HR 64 | Temp 97.5°F

## 2012-01-07 DIAGNOSIS — Z9221 Personal history of antineoplastic chemotherapy: Secondary | ICD-10-CM | POA: Insufficient documentation

## 2012-01-07 DIAGNOSIS — C8589 Other specified types of non-Hodgkin lymphoma, extranodal and solid organ sites: Secondary | ICD-10-CM | POA: Insufficient documentation

## 2012-01-07 DIAGNOSIS — K439 Ventral hernia without obstruction or gangrene: Secondary | ICD-10-CM | POA: Insufficient documentation

## 2012-01-07 DIAGNOSIS — Z90710 Acquired absence of both cervix and uterus: Secondary | ICD-10-CM | POA: Insufficient documentation

## 2012-01-07 DIAGNOSIS — I2510 Atherosclerotic heart disease of native coronary artery without angina pectoris: Secondary | ICD-10-CM | POA: Insufficient documentation

## 2012-01-07 DIAGNOSIS — K449 Diaphragmatic hernia without obstruction or gangrene: Secondary | ICD-10-CM | POA: Insufficient documentation

## 2012-01-07 DIAGNOSIS — R599 Enlarged lymph nodes, unspecified: Secondary | ICD-10-CM | POA: Insufficient documentation

## 2012-01-07 DIAGNOSIS — C8583 Other specified types of non-Hodgkin lymphoma, intra-abdominal lymph nodes: Secondary | ICD-10-CM

## 2012-01-07 DIAGNOSIS — J984 Other disorders of lung: Secondary | ICD-10-CM | POA: Insufficient documentation

## 2012-01-07 LAB — CMP (CANCER CENTER ONLY)
ALT(SGPT): 17 U/L (ref 10–47)
CO2: 28 mEq/L (ref 18–33)
Calcium: 9.6 mg/dL (ref 8.0–10.3)
Chloride: 102 mEq/L (ref 98–108)
Glucose, Bld: 100 mg/dL (ref 73–118)
Sodium: 143 mEq/L (ref 128–145)
Total Protein: 6.8 g/dL (ref 6.4–8.1)

## 2012-01-07 LAB — CBC WITH DIFFERENTIAL/PLATELET
BASO%: 0.4 % (ref 0.0–2.0)
Eosinophils Absolute: 0.1 10*3/uL (ref 0.0–0.5)
HCT: 33.3 % — ABNORMAL LOW (ref 34.8–46.6)
MCHC: 33.6 g/dL (ref 31.5–36.0)
MONO#: 0.5 10*3/uL (ref 0.1–0.9)
NEUT#: 2 10*3/uL (ref 1.5–6.5)
NEUT%: 44.3 % (ref 38.4–76.8)
Platelets: 202 10*3/uL (ref 145–400)
RBC: 3.75 10*6/uL (ref 3.70–5.45)
WBC: 4.6 10*3/uL (ref 3.9–10.3)
lymph#: 1.9 10*3/uL (ref 0.9–3.3)

## 2012-01-07 IMAGING — CT CT ABD-PELV W/O CM
2 of 4 series · 15 of 46 positions shown, 17 images · non-contrast
Comparison: [DATE]

CT CHEST

CLINICAL DATA: Non-Hodgkins lymphoma diagnosed in [8N].
Chemotherapy complete.  History of small bowel resection.  No
current complaints.

CT CHEST, ABDOMEN AND PELVIS WITHOUT  CONTRAST
TECHNIQUE: Contiguous axial images of the chest abdomen and pelvis
were obtained without  IV contrast administration.

[Series 2: cap w/o w/o st · axial · non-contrast · 0.68mm/px · z∈[-422,+113]mm · 12 of 125 slices shown, 14 images]
[im 9/125  soft-tissue]
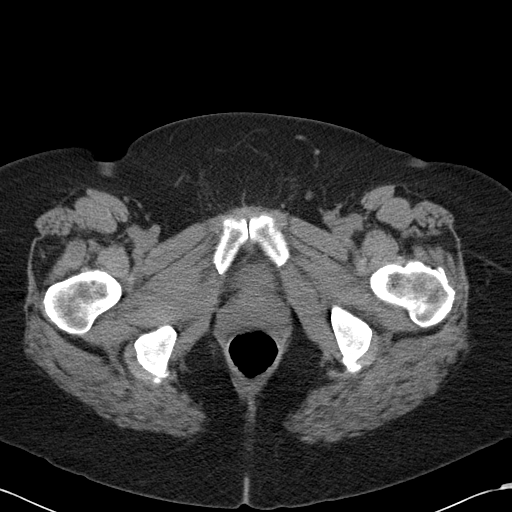
[im 9/125  bone]
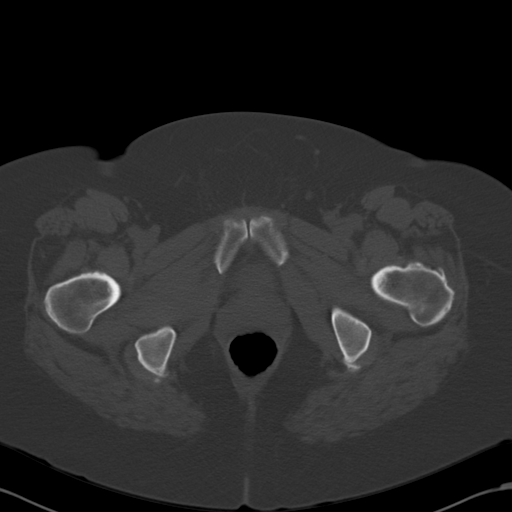
[im 17/125  soft-tissue]
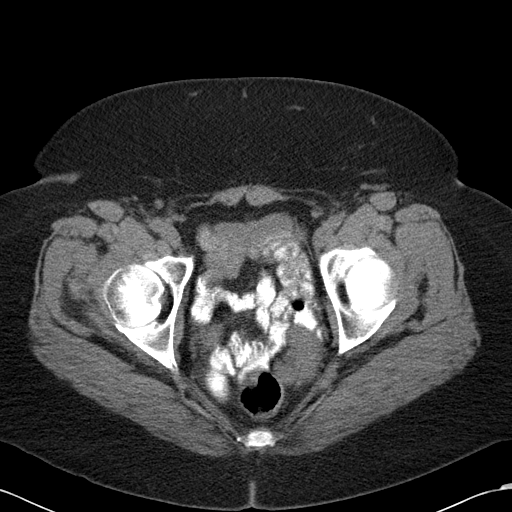
[im 25/125  soft-tissue]
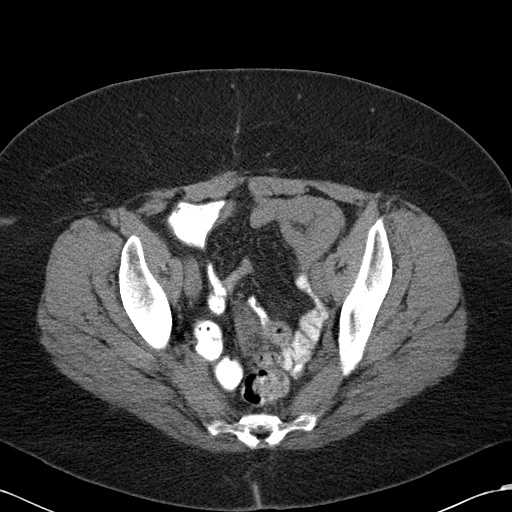
[im 42/125  soft-tissue]
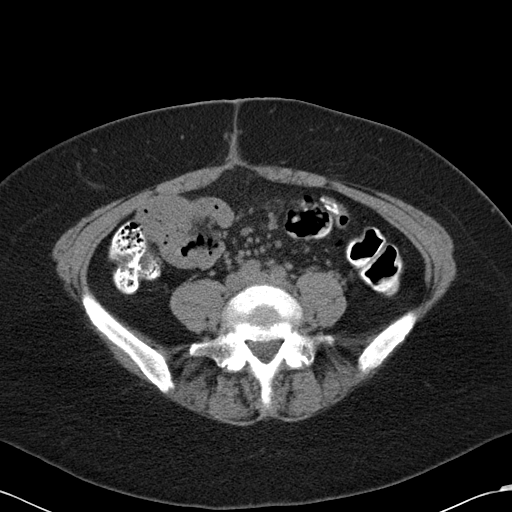
[im 50/125  soft-tissue]
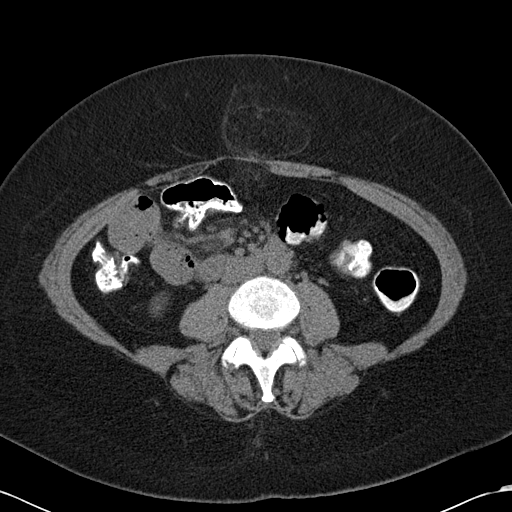
[im 58/125  soft-tissue]
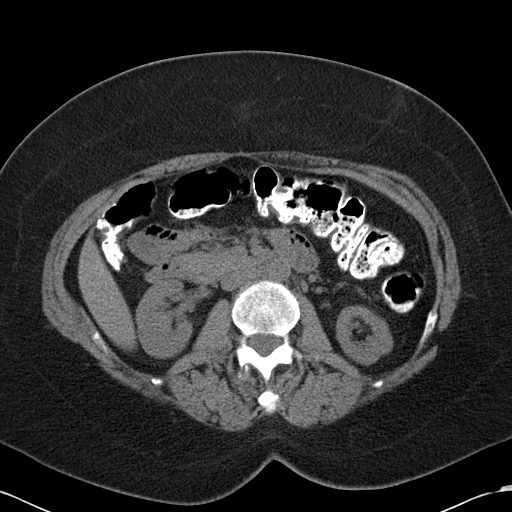
[im 67/125  soft-tissue]
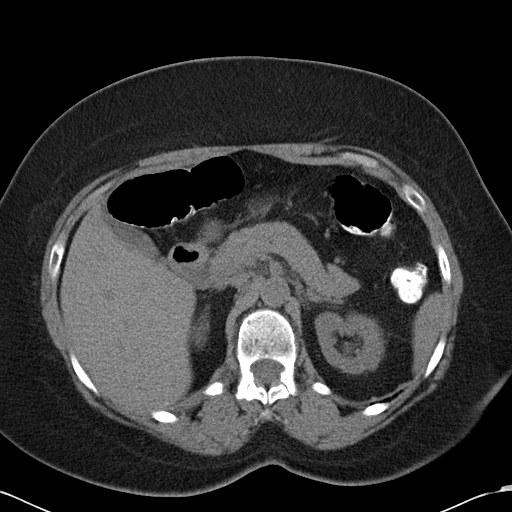
[im 75/125  soft-tissue]
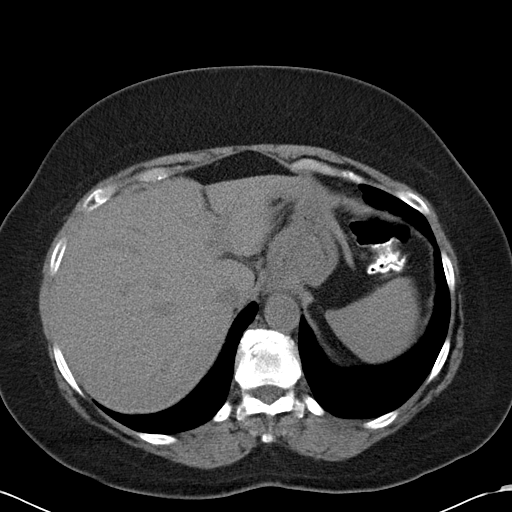
[im 83/125  soft-tissue]
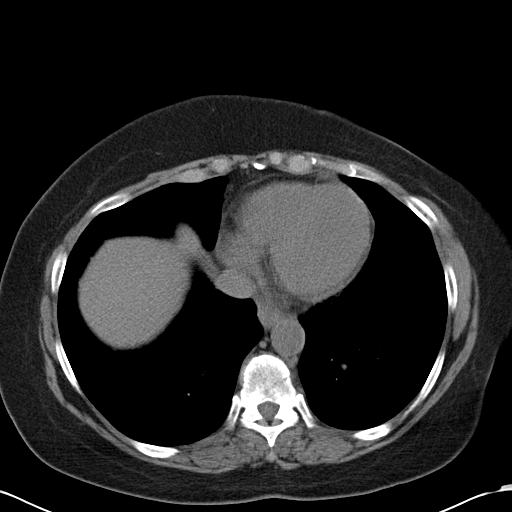
[im 83/125  bone]
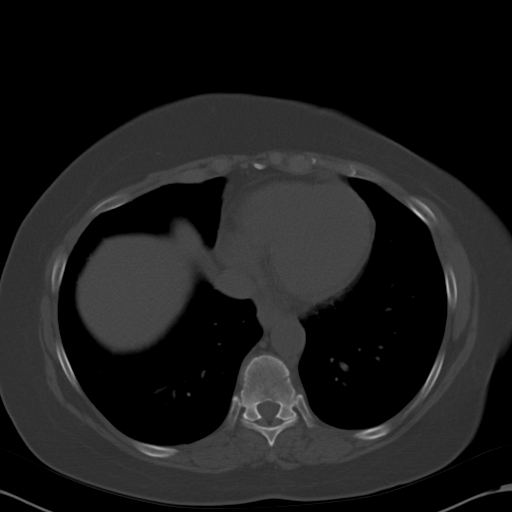
[im 100/125  soft-tissue]
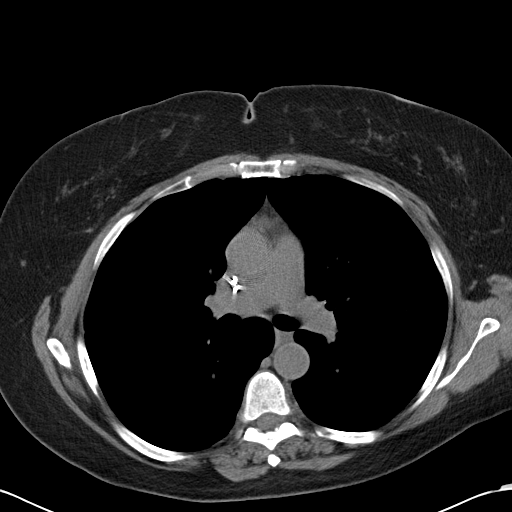
[im 108/125  soft-tissue]
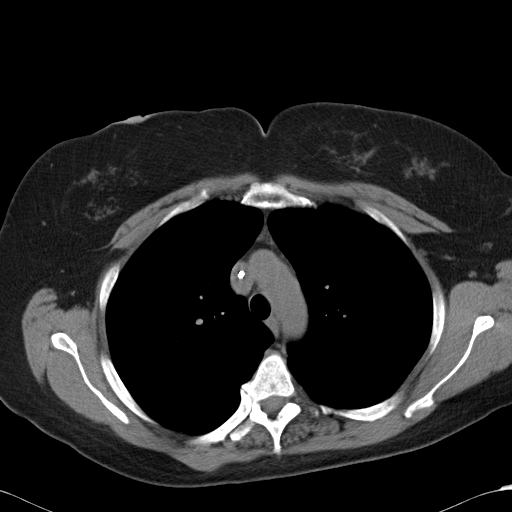
[im 116/125  soft-tissue]
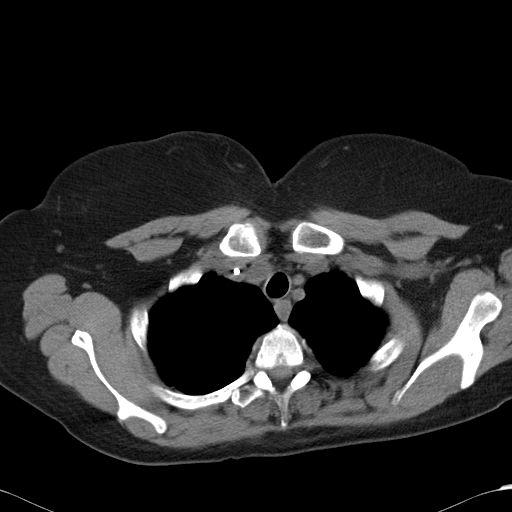

[Series 602: <mpr thick range> · coronal · 1.22mm/px · 3 of 97 slices shown]
[im 33/97  soft-tissue]
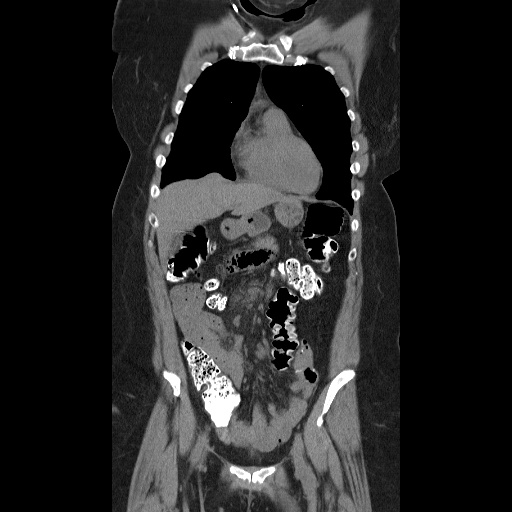
[im 43/97  soft-tissue]
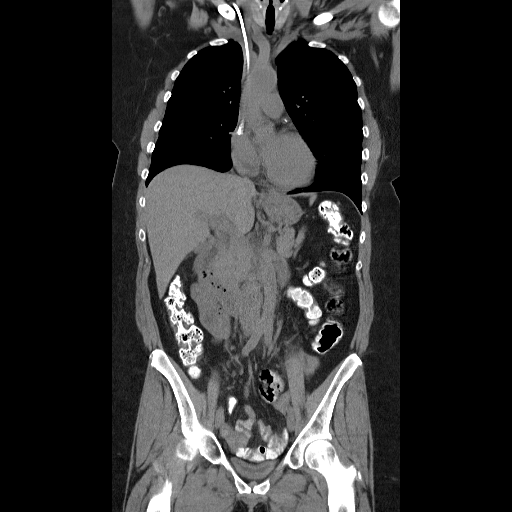
[im 54/97  soft-tissue]
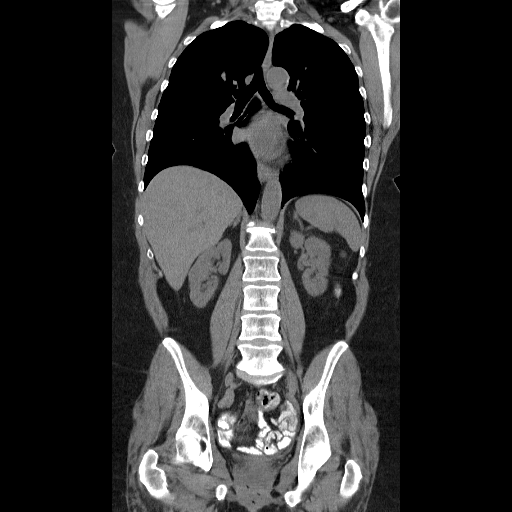

[15 of 46 positions shown; findings below may reference images not displayed]

FINDINGS: Lung windows demonstrate 4 mm posterior right upper lobe
lung nodule which is unchanged on image 20, and likely a subpleural
lymph node.

Mild scarring at the left lung base.  This is unchanged.

Soft tissue windows demonstrate a right-sided Port-A-Cath which is
similarly oblique at the skin surface, but terminates at the caval
atrial junction. No supraclavicular adenopathy.  Normal heart size
without pericardial or pleural effusion.  LAD coronary artery
atherosclerosis on image 31. No mediastinal or hilar adenopathy.  A
small hiatal hernia.  Minimal residual thymic tissue in the
anterior mediastinum; similar.
IMPRESSION: 1. No acute process or evidence of active lymphoma within the
chest.
2. Age advanced coronary artery atherosclerosis.  Recommend
assessment of coronary risk factors and consideration of medical
therapy.
3.  Small hiatal hernia.

CT ABDOMEN AND PELVIS
FINDINGS: Normal uninfused appearance of the liver, spleen,
stomach, pancreas, gallbladder, biliary tract, adrenal glands,
kidneys.  Suboptimal evaluation of the retroperitoneum secondary to
lack of IV contrast and relative paucity of retroperitoneal fat.
No definite retroperitoneal adenopathy.

Normal appearance of the colon and terminal ileum.  Small bowel
loops are poorly opacified with enteric contrast.  There are small
bowel sutures within the left hemi pelvis, including on image 90.
The surrounding small bowel is underdistended.  Apparent mild wall
thickening is favored to be secondary.  Small bowel mesenteric
lymph nodes are again mildly enlarged.  There is concurrent
increased density within the mesenteric fat.  The largest node
today measures 1.3 x 1.6 cm on image 87 versus 1.4 x 1.5 cm on the
prior exam.  On the prior exam, the largest node was positioned
more superiorly and to the right.  Felt to be due to differences in
bowel positioned.  No new adenopathy identified. No ascites.

No evidence of omental or peritoneal disease.  There is a left
paracentral ventral abdominal wall hernia which contains fat on
image 79.  This is progressive since the prior.

No pelvic sidewall adenopathy.  Hysterectomy.  Normal urinary
bladder.  Similar appearance of soft tissue density within the
right paracentral pelvis.  This measures 2.3 x 3.0 cm on image 102
versus 2.2 x 2.5 cm on the prior.  Felt to be similar, given
measurement error.

Soft tissue density within the posterior left hemi pelvis on image
110 is favored to be due to unopacified bowel loops.  2.3 x 3.8 cm
today.  Not present on the prior.

Residual follicle suspected within the high left ovary on image 94
1.3 cm.  Unchanged. No significant free fluid.
Severe degenerative disc disease at the lumbosacral junction.
IMPRESSION: 1.  Similar abdominal pelvic mesenteric adenopathy.
2.  Degraded exam, secondary lack of IV contrast and poor enteric
contrast opacification.  Redemonstration of areas of apparent small
bowel wall thickening, favored to be due to underdistension.  If
small bowel lymphoma is a concern, consider MR enterography.
3.  Probable unopacified bowel in the left lower quadrant.
Recommend attention on follow-up.
4.  Progressive fat containing left paramidline ventral abdominal
wall hernia.

## 2012-01-07 MED ORDER — HEPARIN SOD (PORK) LOCK FLUSH 100 UNIT/ML IV SOLN
500.0000 [IU] | Freq: Once | INTRAVENOUS | Status: AC | PRN
  Administered 2012-01-07: 500 [IU] via INTRAVENOUS
  Filled 2012-01-07: qty 5

## 2012-01-07 MED ORDER — SODIUM CHLORIDE 0.9 % IJ SOLN
10.0000 mL | INTRAMUSCULAR | Status: DC | PRN
  Administered 2012-01-07: 10 mL via INTRAVENOUS
  Filled 2012-01-07: qty 10

## 2012-01-07 NOTE — Patient Instructions (Signed)
Call MD for problems 

## 2012-01-09 ENCOUNTER — Telehealth: Payer: Self-pay | Admitting: Oncology

## 2012-01-09 ENCOUNTER — Ambulatory Visit (HOSPITAL_BASED_OUTPATIENT_CLINIC_OR_DEPARTMENT_OTHER): Payer: BC Managed Care – PPO | Admitting: Oncology

## 2012-01-09 VITALS — BP 143/86 | HR 82 | Temp 97.2°F | Ht 68.0 in | Wt 213.5 lb

## 2012-01-09 DIAGNOSIS — C8293 Follicular lymphoma, unspecified, intra-abdominal lymph nodes: Secondary | ICD-10-CM

## 2012-01-09 DIAGNOSIS — D649 Anemia, unspecified: Secondary | ICD-10-CM

## 2012-01-09 DIAGNOSIS — C8583 Other specified types of non-Hodgkin lymphoma, intra-abdominal lymph nodes: Secondary | ICD-10-CM

## 2012-01-09 DIAGNOSIS — R5381 Other malaise: Secondary | ICD-10-CM

## 2012-01-09 NOTE — Progress Notes (Signed)
Hematology and Oncology Follow Up Visit  Christine Morales [id number] [birth year] 66 y.o. 01/09/2012 9:30 AM  Principle Diagnosis: This is a 66 year old female with follicular lymphoma, non-Hodgkin type, stage IIA, presented with a large mesenteric mass in July 2011.  Prior Therapy: The patient received 5 cycles of bendamustine and rituximab, finished in December 2011.  She had a complete response at that time.  Current therapy: Observation and surveillance.   Interim History:  Christine Morales presents today for a followup visit.  A very pleasant 66 year old female with diagnosis of follicular lymphoma, treated with bendamustine and rituximab as mentioned.  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  Clinically since the last time I saw her, she had been doing very well.  She did not report any nausea, did not report any vomiting.  Had not reported any specific complaints.  Did not report any fevers, did not report any chills.  She has gained her weight back that she had lost throughout the process and overall performing activities of daily living without any major hindrance or decline. She report mild fatigue, but continue to work full time without any decline.  No complications noted. No GI symptoms noted.     Medications: I have reviewed the patient's current medications. Current outpatient prescriptions:amLODipine (NORVASC) 5 MG tablet, Take 1 tablet (5 mg total) by mouth daily., Disp: 90 tablet, Rfl: 3;  Cholecalciferol (VITAMIN D3) 1000 UNITS tablet, Take 1,000 Units by mouth daily.  , Disp: , Rfl: ;  cyanocobalamin (,VITAMIN B-12,) 1000 MCG/ML injection, Inject 1 mL (1,000 mcg total) into the muscle once., Disp: 10 mL, Rfl: 6 ferrous sulfate (FERRO-BOB) 325 (65 FE) MG tablet, Take 325 mg by mouth daily with breakfast.  , Disp: , Rfl: ;  losartan (COZAAR) 100 MG tablet, Take 1  tablet (100 mg total) by mouth daily., Disp: 30 tablet, Rfl: 5;  pyridOXINE (VITAMIN B-6) 100 MG tablet, Take 1 tablet (100 mg total) by mouth daily., Disp: , Rfl:  Syringe/Needle, Disp, (B-D ECLIPSE SYRINGE) 30G X 1/2" 1 ML MISC, 1 each by Does not apply route 1 day or 1 dose., Disp: 50 each, Rfl: 11 No current facility-administered medications for this visit. Facility-Administered Medications Ordered in Other Visits: heparin lock flush 100 unit/mL, 500 Units, Intravenous, Once, Benjiman Core, MD;  sodium chloride 0.9 % injection 10 mL, 10 mL, Intravenous, PRN, Benjiman Core, MD  Allergies: No Known Allergies  Past Medical History, Surgical history, Social history, and Family History were reviewed and updated.  Review of Systems: Constitutional:  Negative for fever, chills, night sweats, anorexia, weight loss, pain. Cardiovascular: no chest pain or dyspnea on exertion Respiratory: no cough, shortness of breath, or wheezing Neurological: no TIA or stroke symptoms Dermatological: negative ENT: negative Skin: Negative. Gastrointestinal: no abdominal pain, change in bowel habits, or black or bloody stools Genito-Urinary: no dysuria, trouble voiding, or hematuria Hematological and Lymphatic: negative Breast: negative Musculoskeletal: negative Remaining ROS negative. Physical Exam: Blood pressure 143/86, pulse 82, temperature 97.2 F (36.2 C), temperature source Oral, height 5\' 8"  (1.727 m), weight 213 lb 8 oz (96.843 kg). ECOG: 1 General appearance: alert Head: Normocephalic, without obvious abnormality, atraumatic Neck: no adenopathy, no carotid bruit, no JVD, supple, symmetrical, trachea midline and thyroid not enlarged, symmetric, no tenderness/mass/nodules Lymph nodes: Cervical, supraclavicular, and axillary nodes normal. Heart:regular rate and rhythm, S1, S2 normal, no murmur,  click, rub or gallop Lung:chest clear, no wheezing, rales, normal symmetric air entry Abdomin: soft,  non-tender, without masses or organomegaly EXT:no erythema, induration, or nodules   Lab Results: Lab Results  Component Value Date   WBC 4.6 01/07/2012   HGB 11.2* 01/07/2012   HCT 33.3* 01/07/2012   MCV 88.7 01/07/2012   PLT 202 01/07/2012     Chemistry      Component Value Date/Time   NA 143 01/07/2012 0754   NA 141 10/08/2011 0829   K 5.0* 01/07/2012 0754   K 4.5 10/08/2011 0829   CL 102 01/07/2012 0754   CL 110 10/08/2011 0829   CO2 28 01/07/2012 0754   CO2 22 10/08/2011 0829   BUN 23* 01/07/2012 0754   BUN 31* 10/08/2011 0829   CREATININE 2.3* 01/07/2012 0754   CREATININE 2.20* 10/08/2011 0829   GLU 87 01/23/2010 1223      Component Value Date/Time   CALCIUM 9.6 01/07/2012 0754   CALCIUM 8.8 10/08/2011 0829   ALKPHOS 103* 01/07/2012 0754   ALKPHOS 130* 10/08/2011 0829   AST 23 01/07/2012 0754   AST 15 10/08/2011 0829   ALT 11 10/08/2011 0829   BILITOT 0.70 01/07/2012 0754   BILITOT 0.5 10/08/2011 0829     CT CHEST, ABDOMEN AND PELVIS WITHOUT CONTRAST  Technique: Contiguous axial images of the chest abdomen and pelvis  were obtained without IV contrast administration.  Comparison: 07/01/2011  CT CHEST  Findings: Lung windows demonstrate 4 mm posterior right upper lobe  lung nodule which is unchanged on image 20, and likely a subpleural  lymph node.  Mild scarring at the left lung base. This is unchanged.  Soft tissue windows demonstrate a right-sided Port-A-Cath which is  similarly oblique at the skin surface, but terminates at the caval  atrial junction. No supraclavicular adenopathy. Normal heart size  without pericardial or pleural effusion. LAD coronary artery  atherosclerosis on image 31. No mediastinal or hilar adenopathy. A  small hiatal hernia. Minimal residual thymic tissue in the  anterior mediastinum; similar.  IMPRESSION:  1. No acute process or evidence of active lymphoma within the  chest.  2. Age advanced coronary artery atherosclerosis. Recommend  assessment of  coronary risk factors and consideration of medical  therapy.  3. Small hiatal hernia.  CT ABDOMEN AND PELVIS  Findings: Normal uninfused appearance of the liver, spleen,  stomach, pancreas, gallbladder, biliary tract, adrenal glands,  kidneys. Suboptimal evaluation of the retroperitoneum secondary to  lack of IV contrast and relative paucity of retroperitoneal fat.  No definite retroperitoneal adenopathy.  Normal appearance of the colon and terminal ileum. Small bowel  loops are poorly opacified with enteric contrast. There are small  bowel sutures within the left hemi pelvis, including on image 90.  The surrounding small bowel is underdistended. Apparent mild wall  thickening is favored to be secondary. Small bowel mesenteric  lymph nodes are again mildly enlarged. There is concurrent  increased density within the mesenteric fat. The largest node  today measures 1.3 x 1.6 cm on image 87 versus 1.4 x 1.5 cm on the  prior exam. On the prior exam, the largest node was positioned  more superiorly and to the right. Felt to be due to differences in  bowel positioned. No new adenopathy identified. No ascites.  No evidence of omental or peritoneal disease. There is a left  paracentral ventral abdominal wall hernia which contains fat on  image 79. This is progressive since the prior.  No  pelvic sidewall adenopathy. Hysterectomy. Normal urinary  bladder. Similar appearance of soft tissue density within the  right paracentral pelvis. This measures 2.3 x 3.0 cm on image 102  versus 2.2 x 2.5 cm on the prior. Felt to be similar, given  measurement error.  Soft tissue density within the posterior left hemi pelvis on image  110 is favored to be due to unopacified bowel loops. 2.3 x 3.8 cm  today. Not present on the prior.  Residual follicle suspected within the high left ovary on image 94  1.3 cm. Unchanged. No significant free fluid.  Severe degenerative disc disease at the lumbosacral junction.    IMPRESSION:  1. Similar abdominal pelvic mesenteric adenopathy.  2. Degraded exam, secondary lack of IV contrast and poor enteric  contrast opacification. Redemonstration of areas of apparent small  bowel wall thickening, favored to be due to underdistension. If  small bowel lymphoma is a concern, consider MR enterography.  3. Probable unopacified bowel in the left lower quadrant.  Recommend attention on follow-up.  4. Progressive fat containing left paramidline ventral abdominal  wall hernia.    Impression and Plan:  This is a pleasant 66 year old female with the following issues:  1. Follicular lymphoma, stage IIA disease, presenting with a large mesenteric mass, status post systemic chemotherapy completed in December 2011.  She is currently on active surveillance.  CT scans appear stable. The plan is to continue follow up and repeat imaging studies in 6 months.   2. Port-A-Cath management.  We will flush her Port-A-Cath today and every 6 weeks.   3. Pancytopenia due to chemotherapy.  That is resolved. 4.     Mild Anemia: likely treatment related. It appears to be improving.    [facility], MD 7/19/20139:30 AM

## 2012-01-09 NOTE — Telephone Encounter (Signed)
appts made and printed for pt aom °

## 2012-02-20 ENCOUNTER — Ambulatory Visit (HOSPITAL_BASED_OUTPATIENT_CLINIC_OR_DEPARTMENT_OTHER): Payer: BC Managed Care – PPO

## 2012-02-20 VITALS — BP 133/78 | HR 78 | Temp 98.0°F | Resp 18

## 2012-02-20 DIAGNOSIS — Z452 Encounter for adjustment and management of vascular access device: Secondary | ICD-10-CM

## 2012-02-20 DIAGNOSIS — C8590 Non-Hodgkin lymphoma, unspecified, unspecified site: Secondary | ICD-10-CM

## 2012-02-20 DIAGNOSIS — C8589 Other specified types of non-Hodgkin lymphoma, extranodal and solid organ sites: Secondary | ICD-10-CM

## 2012-02-20 MED ORDER — HEPARIN SOD (PORK) LOCK FLUSH 100 UNIT/ML IV SOLN
500.0000 [IU] | Freq: Once | INTRAVENOUS | Status: AC
  Administered 2012-02-20: 500 [IU] via INTRAVENOUS
  Filled 2012-02-20: qty 5

## 2012-02-20 MED ORDER — SODIUM CHLORIDE 0.9 % IJ SOLN
10.0000 mL | INTRAMUSCULAR | Status: DC | PRN
  Administered 2012-02-20: 10 mL via INTRAVENOUS
  Filled 2012-02-20: qty 10

## 2012-03-05 ENCOUNTER — Other Ambulatory Visit (INDEPENDENT_AMBULATORY_CARE_PROVIDER_SITE_OTHER): Payer: BC Managed Care – PPO

## 2012-03-05 DIAGNOSIS — Z0000 Encounter for general adult medical examination without abnormal findings: Secondary | ICD-10-CM

## 2012-03-05 LAB — HEPATIC FUNCTION PANEL
ALT: 13 U/L (ref 0–35)
Albumin: 3.8 g/dL (ref 3.5–5.2)
Total Protein: 6.7 g/dL (ref 6.0–8.3)

## 2012-03-05 LAB — CBC WITH DIFFERENTIAL/PLATELET
Basophils Relative: 0.4 % (ref 0.0–3.0)
Eosinophils Absolute: 0.1 10*3/uL (ref 0.0–0.7)
Eosinophils Relative: 2.3 % (ref 0.0–5.0)
Lymphocytes Relative: 34.4 % (ref 12.0–46.0)
MCHC: 32.5 g/dL (ref 30.0–36.0)
MCV: 90.2 fl (ref 78.0–100.0)
Monocytes Absolute: 0.5 10*3/uL (ref 0.1–1.0)
Neutrophils Relative %: 53.5 % (ref 43.0–77.0)
Platelets: 236 10*3/uL (ref 150.0–400.0)
RBC: 3.84 Mil/uL — ABNORMAL LOW (ref 3.87–5.11)
WBC: 5.6 10*3/uL (ref 4.5–10.5)

## 2012-03-05 LAB — LIPID PANEL
Cholesterol: 187 mg/dL (ref 0–200)
HDL: 46 mg/dL (ref >39.00)
Triglycerides: 128 mg/dL (ref 0.0–149.0)

## 2012-03-05 LAB — URINALYSIS, ROUTINE W REFLEX MICROSCOPIC
Hgb urine dipstick: NEGATIVE
Nitrite: NEGATIVE
Specific Gravity, Urine: 1.01 (ref 1.000–1.030)
Urobilinogen, UA: 0.2 (ref 0.0–1.0)

## 2012-03-05 LAB — BASIC METABOLIC PANEL
BUN: 27 mg/dL — ABNORMAL HIGH (ref 6–23)
CO2: 25 mEq/L (ref 19–32)
Calcium: 9 mg/dL (ref 8.4–10.5)
Chloride: 108 mEq/L (ref 96–112)
Creatinine, Ser: 2.5 mg/dL — ABNORMAL HIGH (ref 0.4–1.2)

## 2012-03-08 ENCOUNTER — Ambulatory Visit (INDEPENDENT_AMBULATORY_CARE_PROVIDER_SITE_OTHER): Payer: BC Managed Care – PPO | Admitting: Internal Medicine

## 2012-03-08 ENCOUNTER — Encounter: Payer: Self-pay | Admitting: Internal Medicine

## 2012-03-08 VITALS — BP 140/74 | HR 76 | Temp 97.0°F | Resp 16 | Ht 66.0 in | Wt 213.5 lb

## 2012-03-08 DIAGNOSIS — D509 Iron deficiency anemia, unspecified: Secondary | ICD-10-CM

## 2012-03-08 DIAGNOSIS — N189 Chronic kidney disease, unspecified: Secondary | ICD-10-CM

## 2012-03-08 DIAGNOSIS — C8583 Other specified types of non-Hodgkin lymphoma, intra-abdominal lymph nodes: Secondary | ICD-10-CM

## 2012-03-08 DIAGNOSIS — R634 Abnormal weight loss: Secondary | ICD-10-CM

## 2012-03-08 DIAGNOSIS — E538 Deficiency of other specified B group vitamins: Secondary | ICD-10-CM

## 2012-03-08 DIAGNOSIS — Z0000 Encounter for general adult medical examination without abnormal findings: Secondary | ICD-10-CM | POA: Insufficient documentation

## 2012-03-08 DIAGNOSIS — I10 Essential (primary) hypertension: Secondary | ICD-10-CM

## 2012-03-08 NOTE — Assessment & Plan Note (Addendum)
Monitoring labs Nephrol conslt Dr Lowell Guitar

## 2012-03-08 NOTE — Assessment & Plan Note (Addendum)
We discussed age appropriate health related issues, including available/recomended screening tests and vaccinations. We discussed a need for adhering to healthy diet and exercise. Labs/EKG were reviewed/ordered. All questions were answered.  She declined all shots... 

## 2012-03-08 NOTE — Assessment & Plan Note (Signed)
2011 Non-Hodgkin lymphoma -- Dr Clelia Croft

## 2012-03-08 NOTE — Assessment & Plan Note (Signed)
On iron 

## 2012-03-08 NOTE — Assessment & Plan Note (Signed)
Continue with current prescription therapy as reflected on the Med list.  

## 2012-03-08 NOTE — Assessment & Plan Note (Signed)
Resolved

## 2012-03-08 NOTE — Progress Notes (Signed)
   Subjective:    Patient ID: Christine Morales, female    DOB: [birth year], 66 y.o.   MRN: [id number]  HPI The patient is here for a wellness exam. The patient has been doing well overall without major physical or psychological issues going on lately. The patient presents for a follow-up of  chronic hypertension, anemia, non-Hodgkin's lymphoma, vit B12 def controlled with medicines F/u on wt loss - stable   Wt Readings from Last 3 Encounters:  03/08/12 213 lb 8 oz (96.843 kg)  01/09/12 213 lb 8 oz (96.843 kg)  10/08/11 208 lb 6.4 oz (94.53 kg)   BP Readings from Last 3 Encounters:  03/08/12 140/74  02/20/12 133/78  01/09/12 143/86      Review of Systems  Constitutional: Negative for chills, activity change, appetite change, fatigue and unexpected weight change.  HENT: Negative for congestion, mouth sores and sinus pressure.   Eyes: Negative for visual disturbance.  Respiratory: Negative for cough and chest tightness.   Gastrointestinal: Negative for nausea and abdominal pain.  Genitourinary: Negative for frequency, difficulty urinating and vaginal pain.  Musculoskeletal: Negative for back pain and gait problem.  Skin: Negative for pallor and rash.  Neurological: Negative for dizziness, tremors, weakness, numbness and headaches.  Psychiatric/Behavioral: Negative for confusion and disturbed wake/sleep cycle.       Objective:   Physical Exam  Constitutional: She appears well-developed and well-nourished. No distress.  HENT:  Head: Normocephalic.  Right Ear: External ear normal.  Left Ear: External ear normal.  Nose: Nose normal.  Mouth/Throat: Oropharynx is clear and moist.  Eyes: Conjunctivae normal are normal. Pupils are equal, round, and reactive to light. Right eye exhibits no discharge. Left eye exhibits no discharge.  Neck: Normal range of motion. Neck supple. No JVD present. No tracheal deviation present. No thyromegaly present.  Cardiovascular: Normal rate, regular  rhythm and normal heart sounds.   Pulmonary/Chest: No stridor. No respiratory distress. She has no wheezes.  Abdominal: Soft. Bowel sounds are normal. She exhibits no distension and no mass. There is no tenderness. There is no rebound and no guarding.  Musculoskeletal: She exhibits no edema and no tenderness.  Lymphadenopathy:    She has no cervical adenopathy.  Neurological: She displays normal reflexes. No cranial nerve deficit. She exhibits normal muscle tone. Coordination normal.  Skin: No rash noted. No erythema.  Psychiatric: She has a normal mood and affect. Her behavior is normal. Judgment and thought content normal.     Lab Results  Component Value Date   WBC 5.6 03/05/2012   HGB 11.2* 03/05/2012   HCT 34.6* 03/05/2012   PLT 236.0 03/05/2012   CHOL 187 03/05/2012   TRIG 128.0 03/05/2012   HDL 46.00 03/05/2012   ALT 13 03/05/2012   AST 17 03/05/2012   NA 141 03/05/2012   K 4.2 03/05/2012   CL 108 03/05/2012   CREATININE 2.5* 03/05/2012   BUN 27* 03/05/2012   CO2 25 03/05/2012   TSH 1.87 03/05/2012   INR 1.08 01/27/2010        Assessment & Plan:

## 2012-03-10 ENCOUNTER — Encounter: Payer: Self-pay | Admitting: Internal Medicine

## 2012-04-09 ENCOUNTER — Other Ambulatory Visit (HOSPITAL_BASED_OUTPATIENT_CLINIC_OR_DEPARTMENT_OTHER): Payer: BC Managed Care – PPO | Admitting: Lab

## 2012-04-09 ENCOUNTER — Telehealth: Payer: Self-pay | Admitting: Oncology

## 2012-04-09 ENCOUNTER — Ambulatory Visit (HOSPITAL_BASED_OUTPATIENT_CLINIC_OR_DEPARTMENT_OTHER): Payer: BC Managed Care – PPO | Admitting: Oncology

## 2012-04-09 VITALS — BP 115/63 | HR 74 | Temp 96.7°F | Resp 20 | Ht 66.0 in | Wt 213.6 lb

## 2012-04-09 DIAGNOSIS — T451X5A Adverse effect of antineoplastic and immunosuppressive drugs, initial encounter: Secondary | ICD-10-CM

## 2012-04-09 DIAGNOSIS — D649 Anemia, unspecified: Secondary | ICD-10-CM

## 2012-04-09 DIAGNOSIS — C8583 Other specified types of non-Hodgkin lymphoma, intra-abdominal lymph nodes: Secondary | ICD-10-CM

## 2012-04-09 DIAGNOSIS — C8293 Follicular lymphoma, unspecified, intra-abdominal lymph nodes: Secondary | ICD-10-CM

## 2012-04-09 DIAGNOSIS — D61810 Antineoplastic chemotherapy induced pancytopenia: Secondary | ICD-10-CM

## 2012-04-09 LAB — COMPREHENSIVE METABOLIC PANEL (CC13)
ALT: 11 U/L (ref 0–55)
Alkaline Phosphatase: 116 U/L (ref 40–150)
CO2: 23 mEq/L (ref 22–29)
Creatinine: 2.2 mg/dL — ABNORMAL HIGH (ref 0.6–1.1)
Glucose: 99 mg/dl (ref 70–99)
Sodium: 142 mEq/L (ref 136–145)
Total Bilirubin: 0.6 mg/dL (ref 0.20–1.20)

## 2012-04-09 LAB — CBC WITH DIFFERENTIAL/PLATELET
EOS%: 2.1 % (ref 0.0–7.0)
Eosinophils Absolute: 0.1 10*3/uL (ref 0.0–0.5)
LYMPH%: 31.3 % (ref 14.0–49.7)
MCH: 30.1 pg (ref 25.1–34.0)
MCHC: 33.8 g/dL (ref 31.5–36.0)
MCV: 88.9 fL (ref 79.5–101.0)
MONO%: 9.8 % (ref 0.0–14.0)
NEUT#: 2.6 10*3/uL (ref 1.5–6.5)
Platelets: 210 10*3/uL (ref 145–400)
RBC: 3.81 10*6/uL (ref 3.70–5.45)

## 2012-04-09 MED ORDER — HEPARIN SOD (PORK) LOCK FLUSH 100 UNIT/ML IV SOLN
500.0000 [IU] | Freq: Once | INTRAVENOUS | Status: AC
  Administered 2012-04-09: 500 [IU] via INTRAVENOUS
  Filled 2012-04-09: qty 5

## 2012-04-09 MED ORDER — SODIUM CHLORIDE 0.9 % IJ SOLN
10.0000 mL | INTRAMUSCULAR | Status: DC | PRN
  Administered 2012-04-09: 10 mL via INTRAVENOUS
  Filled 2012-04-09: qty 10

## 2012-04-09 NOTE — Telephone Encounter (Signed)
appts made and printed for pt aom °

## 2012-04-09 NOTE — Progress Notes (Signed)
Hematology and Oncology Follow Up Visit  Christine Morales [id number] [birth year] 66 y.o. 04/09/2012 10:12 AM  Principle Diagnosis: This is a 66 year old female with follicular lymphoma, non-Hodgkin type, stage IIA, presented with a large mesenteric mass in July 2011.  Prior Therapy: The patient received 5 cycles of bendamustine and rituximab, finished in December 2011.  She had a complete response at that time.  Current therapy: Observation and surveillance.   Interim History:  Christine Morales presents today for a followup visit.  A very pleasant 66 year old female with diagnosis of follicular lymphoma, treated with bendamustine and rituximab as mentioned.  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  Clinically since the last time I saw her, she had been doing very well.  She did not report any nausea, did not report any vomiting.  Had not reported any specific complaints.  Did not report any fevers, did not report any chills.  She has gained her weight back that she had lost throughout the process and overall performing activities of daily living without any major hindrance or decline. She report mild fatigue, but continue to work full time without any decline. No complications noted. No GI symptoms noted.     Medications: I have reviewed the patient's current medications. Current outpatient prescriptions:amLODipine (NORVASC) 5 MG tablet, Take 1 tablet (5 mg total) by mouth daily., Disp: 90 tablet, Rfl: 3;  Cholecalciferol (VITAMIN D3) 1000 UNITS tablet, Take 1,000 Units by mouth daily.  , Disp: , Rfl: ;  cyanocobalamin (,VITAMIN B-12,) 1000 MCG/ML injection, Inject 1 mL (1,000 mcg total) into the muscle once., Disp: 10 mL, Rfl: 6 ferrous sulfate (FERRO-BOB) 325 (65 FE) MG tablet, Take 325 mg by mouth daily with breakfast.  , Disp: , Rfl: ;  losartan (COZAAR) 100 MG tablet, Take 1  tablet (100 mg total) by mouth daily., Disp: 30 tablet, Rfl: 5;  Syringe/Needle, Disp, (B-D ECLIPSE SYRINGE) 30G X 1/2" 1 ML MISC, 1 each by Does not apply route 1 day or 1 dose., Disp: 50 each, Rfl: 11 No current facility-administered medications for this visit. Facility-Administered Medications Ordered in Other Visits: heparin lock flush 100 unit/mL, 500 Units, Intravenous, Once, Benjiman Core, MD;  sodium chloride 0.9 % injection 10 mL, 10 mL, Intravenous, PRN, Benjiman Core, MD  Allergies: No Known Allergies  Past Medical History, Surgical history, Social history, and Family History were reviewed and updated.  Review of Systems: Constitutional:  Negative for fever, chills, night sweats, anorexia, weight loss, pain. Cardiovascular: no chest pain or dyspnea on exertion Respiratory: no cough, shortness of breath, or wheezing Neurological: no TIA or stroke symptoms Dermatological: negative ENT: negative Skin: Negative. Gastrointestinal: no abdominal pain, change in bowel habits, or black or bloody stools Genito-Urinary: no dysuria, trouble voiding, or hematuria Hematological and Lymphatic: negative Breast: negative Musculoskeletal: negative Remaining ROS negative. Physical Exam: Blood pressure 115/63, pulse 74, temperature 96.7 F (35.9 C), resp. rate 20, height 5\' 6"  (1.676 m), weight 213 lb 9.6 oz (96.888 kg). ECOG: 1 General appearance: alert Head: Normocephalic, without obvious abnormality, atraumatic Neck: no adenopathy, no carotid bruit, no JVD, supple, symmetrical, trachea midline and thyroid not enlarged, symmetric, no tenderness/mass/nodules Lymph nodes: Cervical, supraclavicular, and axillary nodes normal. Heart:regular rate and rhythm, S1, S2 normal, no murmur, click, rub or gallop Lung:chest clear, no wheezing, rales, normal symmetric air entry Abdomin: soft, non-tender, without masses or organomegaly  EXT:no erythema, induration, or nodules   Lab Results: Lab  Results  Component Value Date   WBC 4.7 04/09/2012   HGB 11.4* 04/09/2012   HCT 33.8* 04/09/2012   MCV 88.9 04/09/2012   PLT 210 04/09/2012   Impression and Plan:  This is a pleasant 66 year old female with the following issues:  1. Follicular lymphoma, stage IIA disease, presenting with a large mesenteric mass, status post systemic chemotherapy completed in December 2011.  She is currently on active surveillance. The plan is to continue follow up and repeat imaging studies in 3 months.   2. Port-A-Cath management.  We will flush her Port-A-Cath today and every 6 weeks.   3. Pancytopenia due to chemotherapy.  That is resolved. 4.     Mild Anemia: likely treatment related. It appears to be improving.    Zayanna Pundt, MD 10/18/201310:12 AM

## 2012-05-03 ENCOUNTER — Encounter: Payer: Self-pay | Admitting: Internal Medicine

## 2012-05-24 ENCOUNTER — Ambulatory Visit (HOSPITAL_BASED_OUTPATIENT_CLINIC_OR_DEPARTMENT_OTHER): Payer: BC Managed Care – PPO

## 2012-05-24 VITALS — BP 154/81 | HR 99 | Temp 96.9°F | Resp 18

## 2012-05-24 DIAGNOSIS — C8590 Non-Hodgkin lymphoma, unspecified, unspecified site: Secondary | ICD-10-CM

## 2012-05-24 DIAGNOSIS — Z452 Encounter for adjustment and management of vascular access device: Secondary | ICD-10-CM

## 2012-05-24 DIAGNOSIS — C8583 Other specified types of non-Hodgkin lymphoma, intra-abdominal lymph nodes: Secondary | ICD-10-CM

## 2012-05-24 MED ORDER — SODIUM CHLORIDE 0.9 % IJ SOLN
10.0000 mL | INTRAMUSCULAR | Status: DC | PRN
  Administered 2012-05-24: 10 mL via INTRAVENOUS
  Filled 2012-05-24: qty 10

## 2012-05-24 MED ORDER — HEPARIN SOD (PORK) LOCK FLUSH 100 UNIT/ML IV SOLN
500.0000 [IU] | Freq: Once | INTRAVENOUS | Status: AC
  Administered 2012-05-24: 500 [IU] via INTRAVENOUS
  Filled 2012-05-24: qty 5

## 2012-07-13 ENCOUNTER — Encounter (HOSPITAL_COMMUNITY): Admission: RE | Admit: 2012-07-13 | Payer: BC Managed Care – PPO | Source: Ambulatory Visit

## 2012-07-13 ENCOUNTER — Other Ambulatory Visit (HOSPITAL_BASED_OUTPATIENT_CLINIC_OR_DEPARTMENT_OTHER): Payer: BC Managed Care – PPO | Admitting: Lab

## 2012-07-13 ENCOUNTER — Ambulatory Visit: Payer: BC Managed Care – PPO

## 2012-07-13 VITALS — BP 120/75 | HR 67 | Temp 97.0°F

## 2012-07-13 DIAGNOSIS — C8583 Other specified types of non-Hodgkin lymphoma, intra-abdominal lymph nodes: Secondary | ICD-10-CM

## 2012-07-13 DIAGNOSIS — C8590 Non-Hodgkin lymphoma, unspecified, unspecified site: Secondary | ICD-10-CM

## 2012-07-13 LAB — COMPREHENSIVE METABOLIC PANEL (CC13)
Albumin: 3.6 g/dL (ref 3.5–5.0)
CO2: 24 mEq/L (ref 22–29)
Chloride: 109 mEq/L — ABNORMAL HIGH (ref 98–107)
Glucose: 89 mg/dl (ref 70–99)
Potassium: 4.2 mEq/L (ref 3.5–5.1)
Sodium: 141 mEq/L (ref 136–145)
Total Protein: 6.7 g/dL (ref 6.4–8.3)

## 2012-07-13 LAB — CBC WITH DIFFERENTIAL/PLATELET
Basophils Absolute: 0 10*3/uL (ref 0.0–0.1)
Eosinophils Absolute: 0.1 10*3/uL (ref 0.0–0.5)
HGB: 10.7 g/dL — ABNORMAL LOW (ref 11.6–15.9)
MCV: 87.5 fL (ref 79.5–101.0)
MONO#: 0.4 10*3/uL (ref 0.1–0.9)
NEUT#: 2.1 10*3/uL (ref 1.5–6.5)
RDW: 15 % — ABNORMAL HIGH (ref 11.2–14.5)
lymph#: 2 10*3/uL (ref 0.9–3.3)

## 2012-07-13 MED ORDER — SODIUM CHLORIDE 0.9 % IJ SOLN
10.0000 mL | INTRAMUSCULAR | Status: DC | PRN
  Administered 2012-07-13: 10 mL via INTRAVENOUS
  Filled 2012-07-13: qty 10

## 2012-07-13 MED ORDER — HEPARIN SOD (PORK) LOCK FLUSH 100 UNIT/ML IV SOLN
500.0000 [IU] | Freq: Once | INTRAVENOUS | Status: AC
Start: 2012-07-13 — End: 2012-07-13
  Administered 2012-07-13: 500 [IU] via INTRAVENOUS
  Filled 2012-07-13: qty 5

## 2012-07-15 ENCOUNTER — Ambulatory Visit (HOSPITAL_BASED_OUTPATIENT_CLINIC_OR_DEPARTMENT_OTHER): Payer: BC Managed Care – PPO | Admitting: Oncology

## 2012-07-15 ENCOUNTER — Telehealth: Payer: Self-pay | Admitting: Oncology

## 2012-07-15 VITALS — BP 125/78 | HR 76 | Temp 97.4°F | Resp 20 | Ht 66.0 in | Wt 217.7 lb

## 2012-07-15 DIAGNOSIS — C8583 Other specified types of non-Hodgkin lymphoma, intra-abdominal lymph nodes: Secondary | ICD-10-CM

## 2012-07-15 DIAGNOSIS — D649 Anemia, unspecified: Secondary | ICD-10-CM

## 2012-07-15 NOTE — Progress Notes (Signed)
Hematology and Oncology Follow Up Visit  Christine Morales [id number] 1946-04-13 67 y.o. 07/15/2012 10:27 AM  Principle Diagnosis: This is a 67 year old female with follicular lymphoma, non-Hodgkin type, stage IIA, presented with a large mesenteric mass in July 2011.  Prior Therapy: The patient received 5 cycles of bendamustine and rituximab, finished in December 2011.  She had a complete response at that time.  Current therapy: Observation and surveillance.   Interim History:  Christine Morales presents today for a followup visit.  A very pleasant 67 year old female with diagnosis of follicular lymphoma, treated with bendamustine and rituximab as mentioned.  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  Clinically since the last time I saw her, she had been doing very well.  She did not report any nausea, did not report any vomiting.  Had not reported any specific complaints.  Did not report any fevers, did not report any chills.  She has gained her weight back that she had lost throughout the process and overall performing activities of daily living without any major hindrance or decline. She report mild fatigue, but continue to work full time without any decline. No complications noted. No GI symptoms noted.   She was evaluated by nephrology and her kidney function is stable.    Medications: I have reviewed the patient's current medications. Current outpatient prescriptions:amLODipine (NORVASC) 5 MG tablet, Take 1 tablet (5 mg total) by mouth daily., Disp: 90 tablet, Rfl: 3;  Cholecalciferol (VITAMIN D3) 1000 UNITS tablet, Take 1,000 Units by mouth daily.  , Disp: , Rfl: ;  cyanocobalamin (,VITAMIN B-12,) 1000 MCG/ML injection, Inject 1 mL (1,000 mcg total) into the muscle once., Disp: 10 mL, Rfl: 6 ferrous sulfate (FERRO-BOB) 325 (65 FE) MG tablet, Take 325 mg by mouth daily with  breakfast.  , Disp: , Rfl: ;  losartan (COZAAR) 100 MG tablet, Take 1 tablet (100 mg total) by mouth daily., Disp: 30 tablet, Rfl: 5;  Syringe/Needle, Disp, (B-D ECLIPSE SYRINGE) 30G X 1/2" 1 ML MISC, 1 each by Does not apply route 1 day or 1 dose., Disp: 50 each, Rfl: 11 No current facility-administered medications for this visit. Facility-Administered Medications Ordered in Other Visits: heparin lock flush 100 unit/mL, 500 Units, Intravenous, Once, Benjiman Core, MD;  sodium chloride 0.9 % injection 10 mL, 10 mL, Intravenous, PRN, Benjiman Core, MD  Allergies: No Known Allergies  Past Medical History, Surgical history, Social history, and Family History were reviewed and updated.  Review of Systems: Constitutional:  Negative for fever, chills, night sweats, anorexia, weight loss, pain. Cardiovascular: no chest pain or dyspnea on exertion Respiratory: no cough, shortness of breath, or wheezing Neurological: no TIA or stroke symptoms Dermatological: negative ENT: negative Skin: Negative. Gastrointestinal: no abdominal pain, change in bowel habits, or black or bloody stools Genito-Urinary: no dysuria, trouble voiding, or hematuria Hematological and Lymphatic: negative Breast: negative Musculoskeletal: negative Remaining ROS negative. Physical Exam: Blood pressure 125/78, pulse 76, temperature 97.4 F (36.3 C), temperature source Oral, resp. rate 20, height 5\' 6"  (1.676 m), weight 217 lb 11.2 oz (98.748 kg). ECOG: 1 General appearance: alert Head: Normocephalic, without obvious abnormality, atraumatic Neck: no adenopathy, no carotid bruit, no JVD, supple, symmetrical, trachea midline and thyroid not enlarged, symmetric, no tenderness/mass/nodules Lymph nodes: Cervical, supraclavicular, and axillary nodes normal. Heart:regular rate and rhythm, S1, S2 normal, no murmur, click, rub or gallop Lung:chest  clear, no wheezing, rales, normal symmetric air entry Abdomin: soft, non-tender,  without masses or organomegaly EXT:no erythema, induration, or nodules   Lab Results: Lab Results  Component Value Date   WBC 4.6 07/13/2012   HGB 10.7* 07/13/2012   HCT 32.3* 07/13/2012   MCV 87.5 07/13/2012   PLT 207 07/13/2012   Impression and Plan:  This is a pleasant 67 year old female with the following issues:  1. Follicular lymphoma, stage IIA disease, presenting with a large mesenteric mass, status post systemic chemotherapy completed in December 2011.  She is currently on active surveillance. The plan is to continue follow up and repeat imaging studies in 3 months.   2. Port-A-Cath management.  We will flush her Port-A-Cath every 6 weeks.   3. Pancytopenia due to chemotherapy.  That is resolved. 4.     Mild Anemia: likely treatment related. It appears to be improving.    [facility], MD 1/23/201410:27 AM

## 2012-07-15 NOTE — Telephone Encounter (Signed)
Gave pt appt for flush on March and April 2014 , pt will have CT and lab on 10/13/12 then see MD on 10/15/12. Gave pt oral contrast for CT

## 2012-07-16 ENCOUNTER — Other Ambulatory Visit: Payer: Self-pay

## 2012-07-16 MED ORDER — LOSARTAN POTASSIUM 100 MG PO TABS: 100.0000 mg | ORAL_TABLET | Freq: Every day | ORAL | Status: DC

## 2012-08-26 ENCOUNTER — Ambulatory Visit (HOSPITAL_BASED_OUTPATIENT_CLINIC_OR_DEPARTMENT_OTHER): Payer: BC Managed Care – PPO

## 2012-08-26 VITALS — BP 129/82 | HR 62 | Temp 97.3°F | Resp 20

## 2012-08-26 DIAGNOSIS — C8583 Other specified types of non-Hodgkin lymphoma, intra-abdominal lymph nodes: Secondary | ICD-10-CM

## 2012-08-26 MED ORDER — SODIUM CHLORIDE 0.9 % IJ SOLN
10.0000 mL | INTRAMUSCULAR | Status: DC | PRN
  Administered 2012-08-26: 10 mL via INTRAVENOUS
  Filled 2012-08-26: qty 10

## 2012-08-26 MED ORDER — HEPARIN SOD (PORK) LOCK FLUSH 100 UNIT/ML IV SOLN
500.0000 [IU] | Freq: Once | INTRAVENOUS | Status: AC | PRN
  Administered 2012-08-26: 500 [IU] via INTRAVENOUS
  Filled 2012-08-26: qty 5

## 2012-09-02 ENCOUNTER — Other Ambulatory Visit: Payer: Self-pay | Admitting: *Deleted

## 2012-09-02 ENCOUNTER — Other Ambulatory Visit (INDEPENDENT_AMBULATORY_CARE_PROVIDER_SITE_OTHER): Payer: BC Managed Care – PPO

## 2012-09-02 DIAGNOSIS — N189 Chronic kidney disease, unspecified: Secondary | ICD-10-CM

## 2012-09-02 DIAGNOSIS — E538 Deficiency of other specified B group vitamins: Secondary | ICD-10-CM

## 2012-09-02 DIAGNOSIS — I10 Essential (primary) hypertension: Secondary | ICD-10-CM

## 2012-09-02 LAB — CBC WITH DIFFERENTIAL/PLATELET
Basophils Absolute: 0 10*3/uL (ref 0.0–0.1)
Eosinophils Absolute: 0.1 10*3/uL (ref 0.0–0.7)
Lymphocytes Relative: 43.8 % (ref 12.0–46.0)
MCHC: 33.8 g/dL (ref 30.0–36.0)
Neutrophils Relative %: 45.3 % (ref 43.0–77.0)
Platelets: 236 10*3/uL (ref 150.0–400.0)
RBC: 3.75 Mil/uL — ABNORMAL LOW (ref 3.87–5.11)
RDW: 15.6 % — ABNORMAL HIGH (ref 11.5–14.6)

## 2012-09-02 LAB — VITAMIN B12: Vitamin B-12: 512 pg/mL (ref 211–911)

## 2012-09-02 LAB — BASIC METABOLIC PANEL
Chloride: 102 mEq/L (ref 96–112)
Potassium: 4.4 mEq/L (ref 3.5–5.1)

## 2012-09-06 ENCOUNTER — Encounter: Payer: Self-pay | Admitting: Internal Medicine

## 2012-09-06 ENCOUNTER — Ambulatory Visit (INDEPENDENT_AMBULATORY_CARE_PROVIDER_SITE_OTHER): Payer: BC Managed Care – PPO | Admitting: Internal Medicine

## 2012-09-06 VITALS — BP 132/88 | HR 76 | Temp 97.6°F | Resp 16 | Wt 219.0 lb

## 2012-09-06 DIAGNOSIS — R634 Abnormal weight loss: Secondary | ICD-10-CM

## 2012-09-06 DIAGNOSIS — Z23 Encounter for immunization: Secondary | ICD-10-CM

## 2012-09-06 NOTE — Progress Notes (Signed)
Subjective:    Patient ID: Christine Morales, female    DOB: 03-Sep-1945, 67 y.o.   MRN: [id number]  HPI  The patient presents for a follow-up of  chronic hypertension, chronic dyslipidemia, B12 def controlled with medicines C/o GERD at times - TUMs help  Wt Readings from Last 3 Encounters:  09/06/12 219 lb (99.338 kg)  07/15/12 217 lb 11.2 oz (98.748 kg)  04/09/12 213 lb 9.6 oz (96.888 kg)   BP Readings from Last 3 Encounters:  09/06/12 132/88  08/26/12 129/82  07/15/12 125/78        Review of Systems  Constitutional: Positive for unexpected weight change (wt gain). Negative for fever, chills, diaphoresis, activity change, appetite change and fatigue.  HENT: Negative for hearing loss, ear pain, nosebleeds, congestion, sore throat, facial swelling, rhinorrhea, sneezing, mouth sores, trouble swallowing, neck pain, neck stiffness, postnasal drip, sinus pressure and tinnitus.   Eyes: Negative for pain, discharge, redness, itching and visual disturbance.  Respiratory: Negative for cough, chest tightness, shortness of breath, wheezing and stridor.   Cardiovascular: Negative for chest pain, palpitations and leg swelling.  Gastrointestinal: Negative for nausea, diarrhea, constipation, blood in stool, abdominal distention, anal bleeding and rectal pain.  Genitourinary: Negative for dysuria, urgency, frequency, hematuria, flank pain, vaginal bleeding, vaginal discharge, difficulty urinating, genital sores and pelvic pain.  Musculoskeletal: Negative for back pain, joint swelling, arthralgias and gait problem.  Skin: Negative.  Negative for rash.  Neurological: Negative for dizziness, tremors, seizures, syncope, speech difficulty, weakness, numbness and headaches.  Hematological: Negative for adenopathy. Does not bruise/bleed easily.  Psychiatric/Behavioral: Negative for suicidal ideas, behavioral problems, sleep disturbance, dysphoric mood and decreased concentration. The patient is not  nervous/anxious.        Objective:   Physical Exam  Constitutional: She appears well-developed. No distress.  Obese  HENT:  Head: Normocephalic.  Right Ear: External ear normal.  Left Ear: External ear normal.  Nose: Nose normal.  Mouth/Throat: Oropharynx is clear and moist.  Eyes: Conjunctivae are normal. Pupils are equal, round, and reactive to light. Right eye exhibits no discharge. Left eye exhibits no discharge.  Neck: Normal range of motion. Neck supple. No JVD present. No tracheal deviation present. No thyromegaly present.  Cardiovascular: Normal rate, regular rhythm and normal heart sounds.   Pulmonary/Chest: No stridor. No respiratory distress. She has no wheezes.  Abdominal: Soft. Bowel sounds are normal. She exhibits no distension and no mass. There is no tenderness. There is no rebound and no guarding.  Musculoskeletal: She exhibits no edema and no tenderness.  Lymphadenopathy:    She has no cervical adenopathy.  Neurological: She displays normal reflexes. No cranial nerve deficit. She exhibits normal muscle tone. Coordination normal.  Skin: No rash noted. No erythema.  Psychiatric: She has a normal mood and affect. Her behavior is normal. Judgment and thought content normal.    Lab Results  Component Value Date   WBC 5.1 09/02/2012   HGB 11.0* 09/02/2012   HCT 32.6* 09/02/2012   PLT 236.0 09/02/2012   GLUCOSE 103* 09/02/2012   CHOL 187 03/05/2012   TRIG 128.0 03/05/2012   HDL 46.00 03/05/2012   LDLCALC 115* 03/05/2012   ALT 10 07/13/2012   AST 17 07/13/2012   NA 137 09/02/2012   K 4.4 09/02/2012   CL 102 09/02/2012   CREATININE 2.4* 09/02/2012   BUN 25* 09/02/2012   CO2 24 09/02/2012   TSH 1.87 03/05/2012   INR 1.08 01/27/2010  Assessment & Plan:

## 2012-09-06 NOTE — Assessment & Plan Note (Signed)
Resolved

## 2012-09-06 NOTE — Assessment & Plan Note (Signed)
CBC

## 2012-09-06 NOTE — Assessment & Plan Note (Signed)
Continue with current prescription therapy as reflected on the Med list.  

## 2012-09-06 NOTE — Assessment & Plan Note (Addendum)
Monitor BMET. Dr Hyman Hopes q 6 mo Lab Results  Component Value Date   CREATININE 2.4* 09/02/2012

## 2012-09-06 NOTE — Progress Notes (Signed)
Patient ID: Christine Morales, female   DOB: [birth year], 67 y.o.   MRN: [id number]

## 2012-09-06 NOTE — Assessment & Plan Note (Signed)
follicular lymphoma, non-Hodgkin type, stage IIA, presented with a large mesenteric mass in July 2011

## 2012-09-07 ENCOUNTER — Encounter: Payer: Self-pay | Admitting: Internal Medicine

## 2012-09-07 ENCOUNTER — Telehealth: Payer: Self-pay | Admitting: Oncology

## 2012-09-07 ENCOUNTER — Ambulatory Visit (INDEPENDENT_AMBULATORY_CARE_PROVIDER_SITE_OTHER): Payer: BC Managed Care – PPO | Admitting: Internal Medicine

## 2012-09-07 VITALS — BP 120/78 | HR 69 | Temp 97.8°F | Resp 16

## 2012-09-07 DIAGNOSIS — C8583 Other specified types of non-Hodgkin lymphoma, intra-abdominal lymph nodes: Secondary | ICD-10-CM

## 2012-09-07 DIAGNOSIS — I10 Essential (primary) hypertension: Secondary | ICD-10-CM

## 2012-09-07 DIAGNOSIS — R109 Unspecified abdominal pain: Secondary | ICD-10-CM

## 2012-09-07 DIAGNOSIS — R1032 Left lower quadrant pain: Secondary | ICD-10-CM | POA: Insufficient documentation

## 2012-09-07 MED ORDER — CIPROFLOXACIN HCL 500 MG PO TABS: 500.0000 mg | ORAL_TABLET | Freq: Two times a day (BID) | ORAL | Status: DC

## 2012-09-07 NOTE — Assessment & Plan Note (Signed)
Abd CT was ordered by Dr Clelia Croft. Will move up the CT test Possible diverticulitis vs other  Empiric abx. Low residue diet

## 2012-09-07 NOTE — Telephone Encounter (Signed)
Gave pt apt for CT 09/09/2012  NPO 4 hrs prior to Ct

## 2012-09-07 NOTE — Progress Notes (Signed)
Subjective:    Patient ID: Christine Morales, female    DOB: Oct 28, 1945, 67 y.o.   MRN: [id number]  HPI  C/o noticing a LUQ/LLQ mass or swelling accompanied by severe pain over past few days. No n/v. No diarrhea The patient presents for a follow-up of  chronic hypertension, chronic dyslipidemia, B12 def controlled with medicines   Wt Readings from Last 3 Encounters:  09/06/12 219 lb (99.338 kg)  07/15/12 217 lb 11.2 oz (98.748 kg)  04/09/12 213 lb 9.6 oz (96.888 kg)   BP Readings from Last 3 Encounters:  09/07/12 120/78  09/06/12 132/88  08/26/12 129/82        Review of Systems  Constitutional: Positive for unexpected weight change (wt gain). Negative for fever, chills, diaphoresis, activity change, appetite change and fatigue.  HENT: Negative for hearing loss, ear pain, nosebleeds, congestion, sore throat, facial swelling, rhinorrhea, sneezing, mouth sores, trouble swallowing, neck pain, neck stiffness, postnasal drip, sinus pressure and tinnitus.   Eyes: Negative for pain, discharge, redness, itching and visual disturbance.  Respiratory: Negative for cough, chest tightness, shortness of breath, wheezing and stridor.   Cardiovascular: Negative for chest pain, palpitations and leg swelling.  Gastrointestinal: Positive for abdominal pain and abdominal distention. Negative for nausea, diarrhea, constipation, blood in stool, anal bleeding and rectal pain.  Genitourinary: Negative for dysuria, urgency, frequency, hematuria, flank pain, vaginal bleeding, vaginal discharge, difficulty urinating, genital sores and pelvic pain.  Musculoskeletal: Negative for back pain, joint swelling, arthralgias and gait problem.  Skin: Negative.  Negative for rash.  Neurological: Negative for dizziness, tremors, seizures, syncope, speech difficulty, weakness, numbness and headaches.  Hematological: Negative for adenopathy. Does not bruise/bleed easily.  Psychiatric/Behavioral: Negative for suicidal ideas,  behavioral problems, sleep disturbance, dysphoric mood and decreased concentration. The patient is not nervous/anxious.        Objective:   Physical Exam  Constitutional: She appears well-developed. No distress.  Obese  HENT:  Head: Normocephalic.  Right Ear: External ear normal.  Left Ear: External ear normal.  Nose: Nose normal.  Mouth/Throat: Oropharynx is clear and moist.  Eyes: Conjunctivae are normal. Pupils are equal, round, and reactive to light. Right eye exhibits no discharge. Left eye exhibits no discharge.  Neck: Normal range of motion. Neck supple. No JVD present. No tracheal deviation present. No thyromegaly present.  Cardiovascular: Normal rate, regular rhythm and normal heart sounds.   Pulmonary/Chest: No stridor. No respiratory distress. She has no wheezes. She exhibits no tenderness.  Abdominal: Soft. Bowel sounds are normal. She exhibits no distension and no mass. There is tenderness (mild in LLQ). There is no rebound and no guarding.  Musculoskeletal: She exhibits no edema and no tenderness.  Lymphadenopathy:    She has no cervical adenopathy.  Neurological: She displays normal reflexes. No cranial nerve deficit. She exhibits normal muscle tone. Coordination normal.  Skin: No rash noted. No erythema.  Psychiatric: She has a normal mood and affect. Her behavior is normal. Judgment and thought content normal.    Lab Results  Component Value Date   WBC 5.1 09/02/2012   HGB 11.0* 09/02/2012   HCT 32.6* 09/02/2012   PLT 236.0 09/02/2012   GLUCOSE 103* 09/02/2012   CHOL 187 03/05/2012   TRIG 128.0 03/05/2012   HDL 46.00 03/05/2012   LDLCALC 115* 03/05/2012   ALT 10 07/13/2012   AST 17 07/13/2012   NA 137 09/02/2012   K 4.4 09/02/2012   CL 102 09/02/2012   CREATININE 2.4* 09/02/2012  BUN 25* 09/02/2012   CO2 24 09/02/2012   TSH 1.87 03/05/2012   INR 1.08 01/27/2010         Assessment & Plan:

## 2012-09-07 NOTE — Assessment & Plan Note (Addendum)
Abd CT was ordered by Dr Shadad. Will move up the CT test Possible diverticulitis vs other  Empiric abx. Low residue diet 

## 2012-09-07 NOTE — Assessment & Plan Note (Signed)
Continue with current prescription therapy as reflected on the Med list.  

## 2012-09-07 NOTE — Assessment & Plan Note (Signed)
CT is planned

## 2012-09-09 ENCOUNTER — Ambulatory Visit (HOSPITAL_COMMUNITY)
Admission: RE | Admit: 2012-09-09 | Discharge: 2012-09-09 | Disposition: A | Discharge status: Home / Self Care | Payer: BC Managed Care – PPO | Source: Ambulatory Visit | Attending: Oncology | Admitting: Oncology

## 2012-09-09 ENCOUNTER — Telehealth: Payer: Self-pay | Admitting: *Deleted

## 2012-09-09 DIAGNOSIS — M47814 Spondylosis without myelopathy or radiculopathy, thoracic region: Secondary | ICD-10-CM | POA: Insufficient documentation

## 2012-09-09 DIAGNOSIS — C8589 Other specified types of non-Hodgkin lymphoma, extranodal and solid organ sites: Secondary | ICD-10-CM | POA: Insufficient documentation

## 2012-09-09 DIAGNOSIS — M799 Soft tissue disorder, unspecified: Secondary | ICD-10-CM | POA: Insufficient documentation

## 2012-09-09 DIAGNOSIS — Z8711 Personal history of peptic ulcer disease: Secondary | ICD-10-CM | POA: Insufficient documentation

## 2012-09-09 DIAGNOSIS — K439 Ventral hernia without obstruction or gangrene: Secondary | ICD-10-CM | POA: Insufficient documentation

## 2012-09-09 DIAGNOSIS — N83209 Unspecified ovarian cyst, unspecified side: Secondary | ICD-10-CM | POA: Insufficient documentation

## 2012-09-09 IMAGING — CT CT ABD-PELV W/O CM
2 of 4 series · 16 of 46 positions shown, 18 images · non-contrast
Comparison: [DATE]

CT CHEST

CLINICAL DATA: NHL diagnosed [DATE], chemotherapy complete.
History of small bowel resection and peptic ulcers.

CT CHEST, ABDOMEN AND PELVIS WITHOUT CONTRAST
TECHNIQUE: Multidetector CT imaging of the chest, abdomen and
pelvis was performed following the standard protocol without IV
contrast.

[Series 2: cap w/o w/o st · axial · non-contrast · 0.68mm/px · z∈[-616,-96]mm · 13 of 121 slices shown, 15 images]
[im 9/121  soft-tissue]
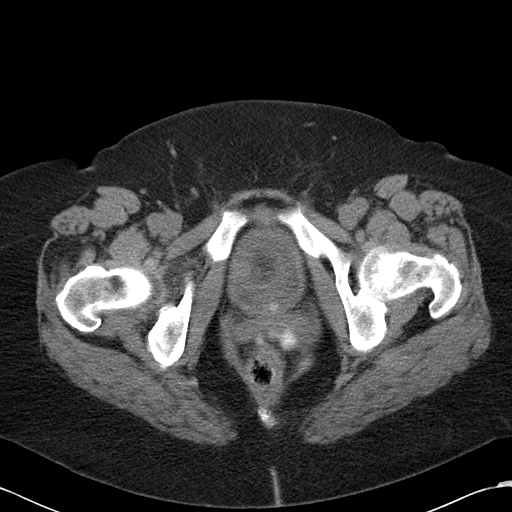
[im 9/121  bone]
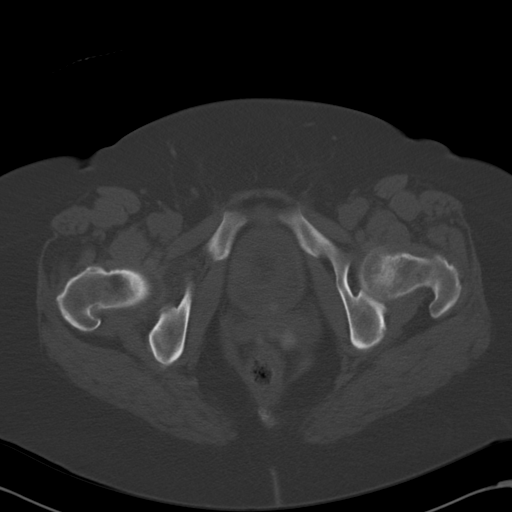
[im 17/121  soft-tissue]
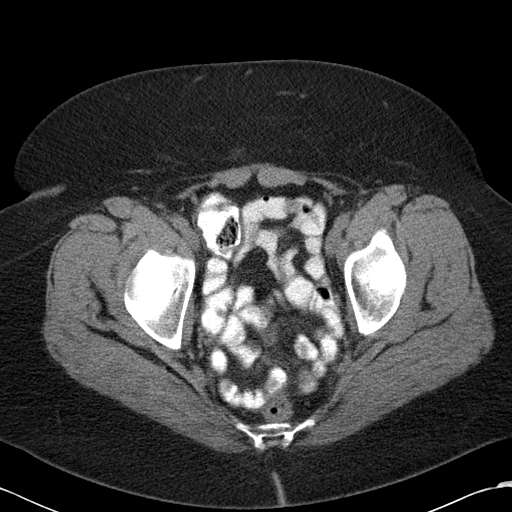
[im 25/121  soft-tissue]
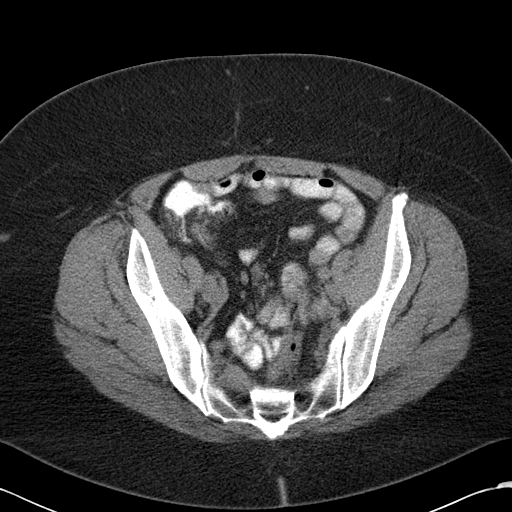
[im 33/121  soft-tissue]
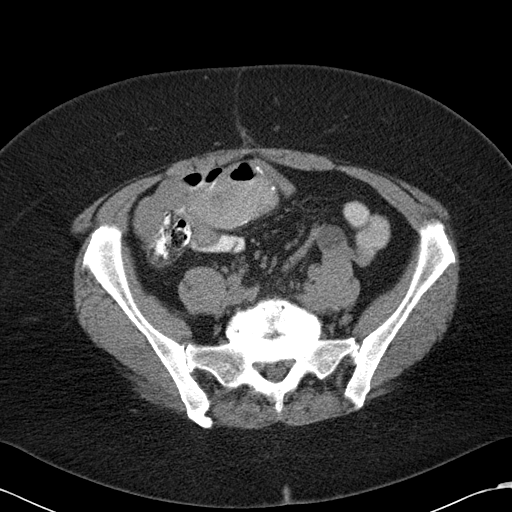
[im 41/121  soft-tissue]
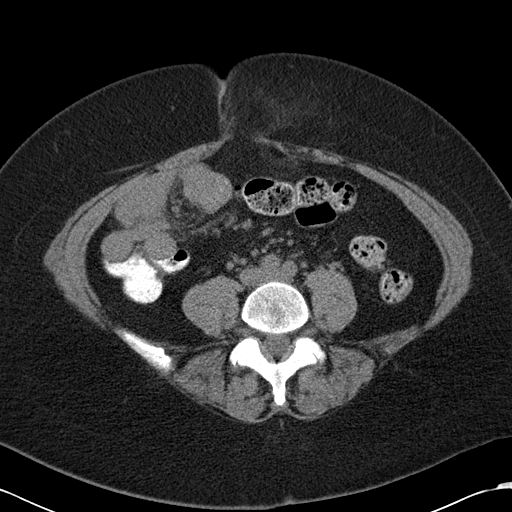
[im 49/121  soft-tissue]
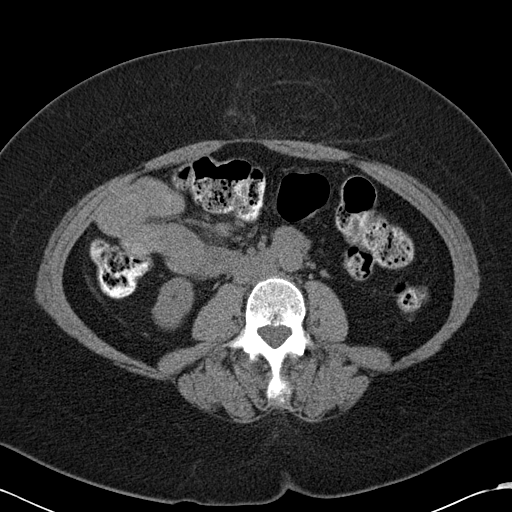
[im 65/121  soft-tissue]
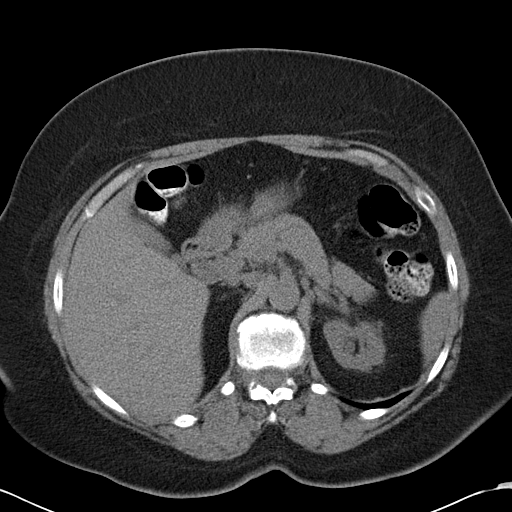
[im 73/121  soft-tissue]
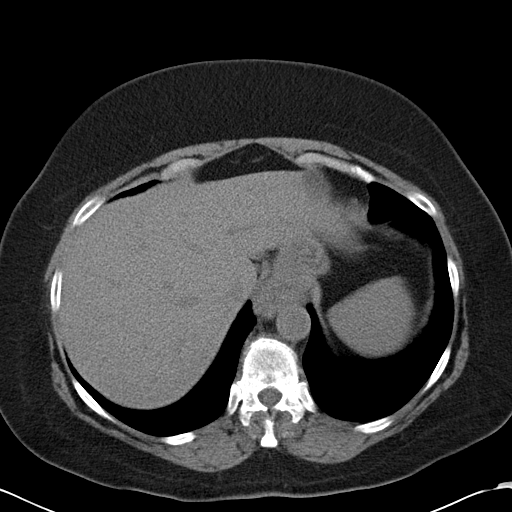
[im 81/121  soft-tissue]
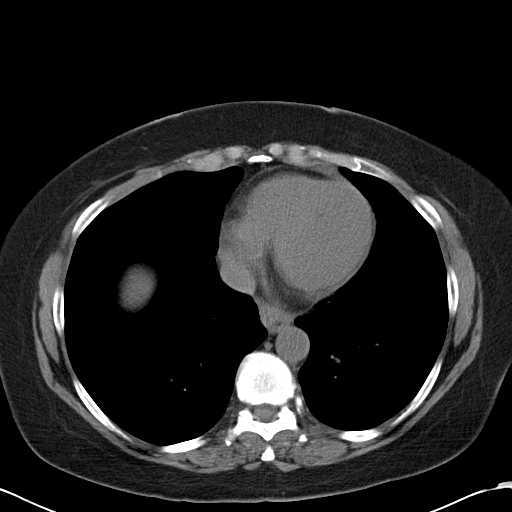
[im 81/121  bone]
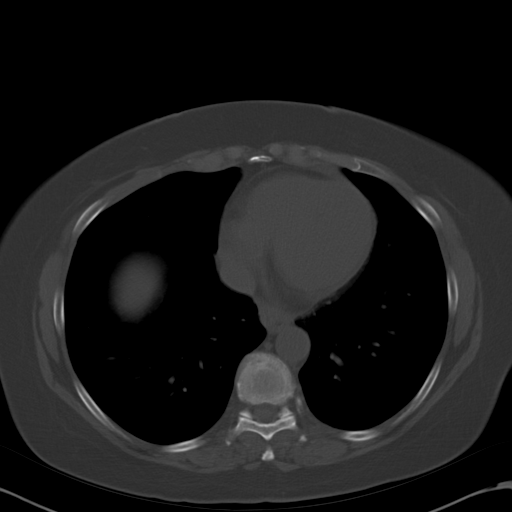
[im 89/121  soft-tissue]
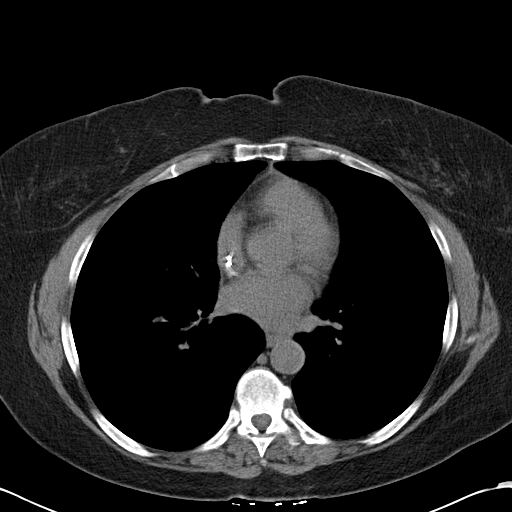
[im 97/121  soft-tissue]
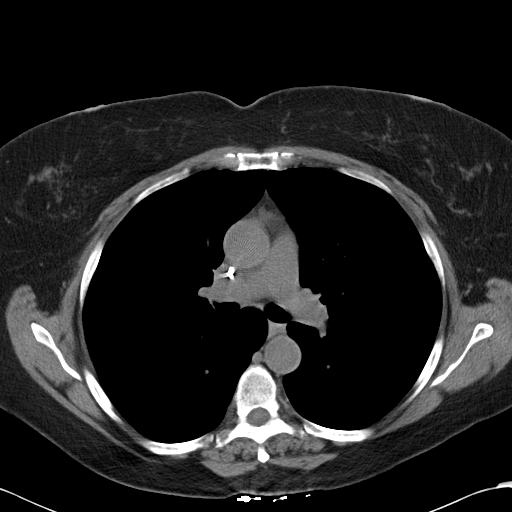
[im 105/121  soft-tissue]
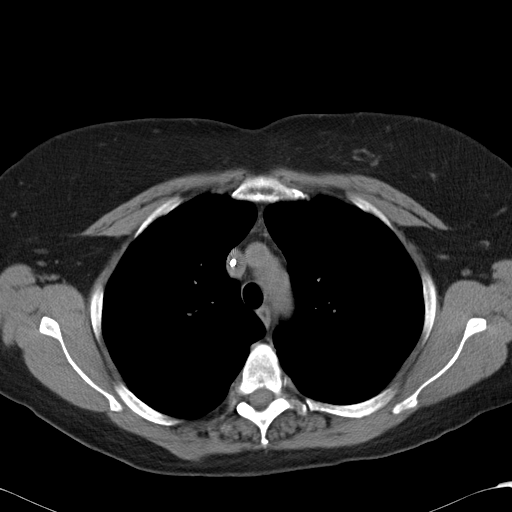
[im 113/121  soft-tissue]
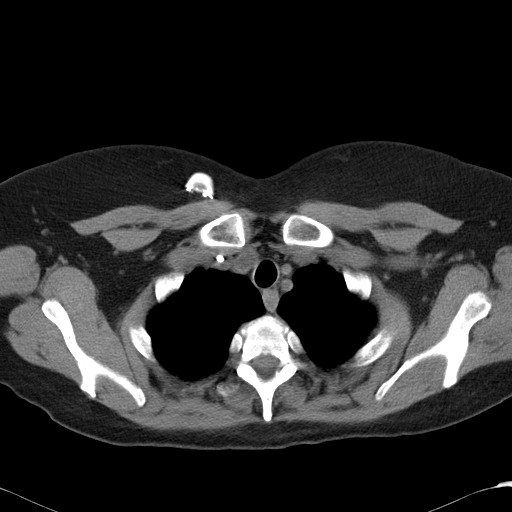

[Series 602: <mpr thick range> · coronal · 1.18mm/px · 3 of 104 slices shown]
[im 35/104  soft-tissue]
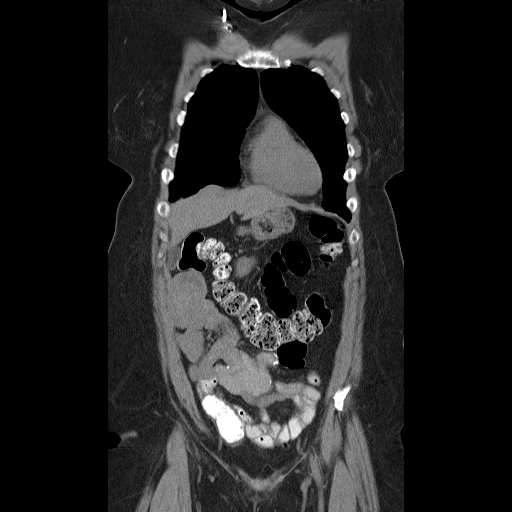
[im 46/104  soft-tissue]
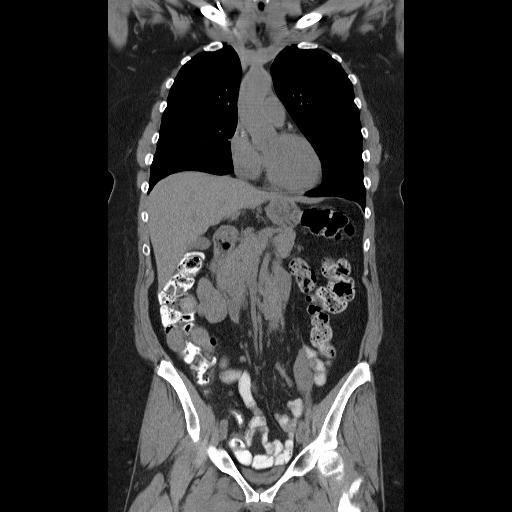
[im 58/104  soft-tissue]
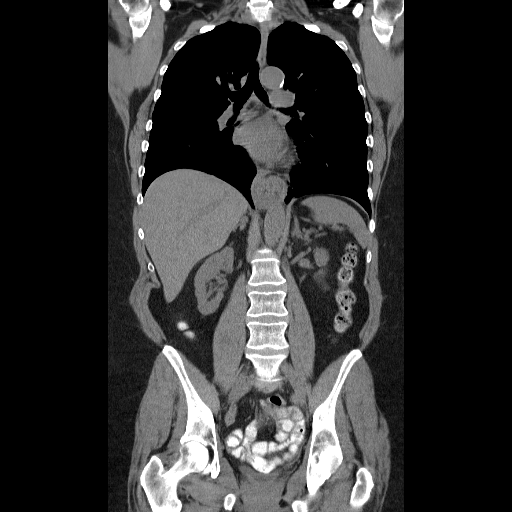

[16 of 46 positions shown; findings below may reference images not displayed]

FINDINGS: No suspicious pulmonary nodules. No pleural effusion or
pneumothorax.

Visualized thyroid is unremarkable.

The heart is normal in size.  No pericardial effusion.  Coronary
atherosclerosis.  Atherosclerotic calcifications of the aortic
arch.

Right chest port.

No suspicious mediastinal or axillary lymphadenopathy.

Degenerative changes of the lower thoracic spine.
IMPRESSION: No evidence of lymphomatous involvement in the chest.

CT ABDOMEN AND PELVIS
FINDINGS: Moderate hiatal hernia.

Unenhanced liver, spleen, pancreas, and adrenal glands within
normal limits.

Gallbladder is underdistended.  No intrahepatic or extrahepatic
ductal dilatation.

Kidneys are unremarkable.  No renal calculi or hydronephrosis.

No evidence of bowel obstruction.  Prior small bowel resection with
anastomoses in the right mid abdomen (series 2/image 91).  Mild
secondary bowel wall thickening is possible.

No evidence of abdominal aortic aneurysm.

Small retroperitoneal nodes.  Small mesenteric nodes, including a
1.3 cm short axis jejunal mesenteric node (series 2/image 85),
similar.

No abdominopelvic ascites.

2.7 x 2.0 cm soft tissue lesion in the lower pelvis (series 2/image
103), previously 3.0 x 2.3 cm.

Status post hysterectomy.  2.0 x 1.6 cm left ovarian cystic lesion,
previously 1.7 x 1.4 cm.

Large fat-containing midline ventral hernia (series 2/image 78).

Degenerative changes of the lumbar spine, most prominent at L5-S1.
IMPRESSION: Stable small retroperitoneal/mesenteric nodes measuring up to
cm.

Additional 2.7 cm soft tissue lesion in the lower pelvis, possibly
mildly decreased.

2.0 cm left ovarian cystic lesion, possibly mildly increased.
Given postmenopausal status, annual ultrasound follow-up is
suggested.

This recommendation follows ACR consensus guidelines:  White Paper
of the ACR Incidental Findings Committee II on Adnexal Findings.  [HOSPITAL] [DATE].

## 2012-09-09 NOTE — Telephone Encounter (Signed)
Message copied by Reesa Chew on Thu Sep 09, 2012  4:09 PM ------      Message from: Benjiman Core      Created: Thu Sep 09, 2012  4:05 PM       Please call the patient: CT scan is unchanged. Lymphoma is not causing her pain. Keep her appointment as is. ------

## 2012-09-09 NOTE — Telephone Encounter (Signed)
Lm on answering machine for patient to call me 

## 2012-09-10 ENCOUNTER — Telehealth: Payer: Self-pay | Admitting: *Deleted

## 2012-09-10 NOTE — Telephone Encounter (Signed)
Message copied by Reesa Chew on Fri Sep 10, 2012 12:21 PM ------      Message from: Benjiman Core      Created: Thu Sep 09, 2012  4:05 PM       Please call the patient: CT scan is unchanged. Lymphoma is not causing her pain. Keep her appointment as is. ------

## 2012-09-10 NOTE — Telephone Encounter (Signed)
Spoke with patient re: her recent ct scan, which is unchanged, per dr Clelia Croft.

## 2012-10-13 ENCOUNTER — Other Ambulatory Visit (HOSPITAL_BASED_OUTPATIENT_CLINIC_OR_DEPARTMENT_OTHER): Payer: BC Managed Care – PPO

## 2012-10-13 ENCOUNTER — Ambulatory Visit (HOSPITAL_BASED_OUTPATIENT_CLINIC_OR_DEPARTMENT_OTHER): Payer: BC Managed Care – PPO

## 2012-10-13 ENCOUNTER — Other Ambulatory Visit (HOSPITAL_COMMUNITY): Payer: BC Managed Care – PPO

## 2012-10-13 VITALS — BP 120/67 | HR 62 | Temp 97.6°F

## 2012-10-13 DIAGNOSIS — C8583 Other specified types of non-Hodgkin lymphoma, intra-abdominal lymph nodes: Secondary | ICD-10-CM

## 2012-10-13 DIAGNOSIS — C8588 Other specified types of non-Hodgkin lymphoma, lymph nodes of multiple sites: Secondary | ICD-10-CM

## 2012-10-13 LAB — CBC WITH DIFFERENTIAL/PLATELET
Basophils Absolute: 0 10*3/uL (ref 0.0–0.1)
HCT: 32.6 % — ABNORMAL LOW (ref 34.8–46.6)
HGB: 11.1 g/dL — ABNORMAL LOW (ref 11.6–15.9)
LYMPH%: 42.5 % (ref 14.0–49.7)
MCH: 29.9 pg (ref 25.1–34.0)
MONO#: 0.3 10*3/uL (ref 0.1–0.9)
NEUT%: 46.4 % (ref 38.4–76.8)
Platelets: 206 10*3/uL (ref 145–400)
WBC: 4.4 10*3/uL (ref 3.9–10.3)
lymph#: 1.9 10*3/uL (ref 0.9–3.3)

## 2012-10-13 LAB — COMPREHENSIVE METABOLIC PANEL (CC13)
BUN: 22.8 mg/dL (ref 7.0–26.0)
CO2: 23 mEq/L (ref 22–29)
Calcium: 9.2 mg/dL (ref 8.4–10.4)
Chloride: 109 mEq/L — ABNORMAL HIGH (ref 98–107)
Creatinine: 2.5 mg/dL — ABNORMAL HIGH (ref 0.6–1.1)
Glucose: 109 mg/dl — ABNORMAL HIGH (ref 70–99)
Total Bilirubin: 0.51 mg/dL (ref 0.20–1.20)

## 2012-10-13 MED ORDER — HEPARIN SOD (PORK) LOCK FLUSH 100 UNIT/ML IV SOLN
500.0000 [IU] | Freq: Once | INTRAVENOUS | Status: AC | PRN
  Administered 2012-10-13: 500 [IU] via INTRAVENOUS
  Filled 2012-10-13: qty 5

## 2012-10-13 MED ORDER — SODIUM CHLORIDE 0.9 % IJ SOLN
10.0000 mL | INTRAMUSCULAR | Status: DC | PRN
  Administered 2012-10-13: 10 mL via INTRAVENOUS
  Filled 2012-10-13: qty 10

## 2012-10-15 ENCOUNTER — Telehealth: Payer: Self-pay | Admitting: Oncology

## 2012-10-15 ENCOUNTER — Ambulatory Visit (HOSPITAL_BASED_OUTPATIENT_CLINIC_OR_DEPARTMENT_OTHER): Payer: BC Managed Care – PPO | Admitting: Oncology

## 2012-10-15 VITALS — BP 137/81 | HR 60 | Temp 97.0°F | Resp 20 | Ht 66.0 in | Wt 220.4 lb

## 2012-10-15 DIAGNOSIS — C8583 Other specified types of non-Hodgkin lymphoma, intra-abdominal lymph nodes: Secondary | ICD-10-CM

## 2012-10-15 DIAGNOSIS — D649 Anemia, unspecified: Secondary | ICD-10-CM

## 2012-10-15 NOTE — Progress Notes (Signed)
Hematology and Oncology Follow Up Visit  Christine Morales [id number] [birth year] 67 y.o. 10/15/2012 9:20 AM  Principle Diagnosis: This is a 67 year old female with follicular lymphoma, non-Hodgkin type, stage IIA, presented with a large mesenteric mass in July 2011.  Prior Therapy: The patient received 5 cycles of bendamustine and rituximab, finished in December 2011.  She had a complete response at that time.  Current therapy: Observation and surveillance.   Interim History:  Christine Morales presents today for a followup visit.  A very pleasant 67 year old female with diagnosis of follicular lymphoma, treated with bendamustine and rituximab as mentioned.  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  Clinically since the last time I saw her, she had been doing very well.  She did not report any nausea, did not report any vomiting.  Had not reported any specific complaints.  Did not report any fevers, did not report any chills. She has gained her weight back that she had lost throughout the process and overall performing activities of daily living without any major hindrance or decline. She report mild fatigue, but continue to work full time without any decline. No complications noted. No new symptoms noted.    Medications: I have reviewed the patient's current medications. Current outpatient prescriptions:amLODipine (NORVASC) 5 MG tablet, Take 1 tablet (5 mg total) by mouth daily., Disp: 90 tablet, Rfl: 3;  Cholecalciferol (VITAMIN D3) 1000 UNITS tablet, Take 1,000 Units by mouth daily.  , Disp: , Rfl: ;  ciprofloxacin (CIPRO) 500 MG tablet, Take 1 tablet (500 mg total) by mouth 2 (two) times daily., Disp: 20 tablet, Rfl: 0 cyanocobalamin (,VITAMIN B-12,) 1000 MCG/ML injection, Inject 1 mL (1,000 mcg total) into the muscle once., Disp: 10 mL, Rfl: 6;  ferrous sulfate (FERRO-BOB) 325 (65  FE) MG tablet, Take 325 mg by mouth daily with breakfast.  , Disp: , Rfl: ;  losartan (COZAAR) 100 MG tablet, Take 1 tablet (100 mg total) by mouth daily., Disp: 30 tablet, Rfl: 11 Syringe/Needle, Disp, (B-D ECLIPSE SYRINGE) 30G X 1/2" 1 ML MISC, 1 each by Does not apply route 1 day or 1 dose., Disp: 50 each, Rfl: 11 No current facility-administered medications for this visit. Facility-Administered Medications Ordered in Other Visits: heparin lock flush 100 unit/mL, 500 Units, Intravenous, Once, Benjiman Core, MD;  sodium chloride 0.9 % injection 10 mL, 10 mL, Intravenous, PRN, Benjiman Core, MD  Allergies: No Known Allergies  Past Medical History, Surgical history, Social history, and Family History were reviewed and updated.  Review of Systems: Constitutional:  Negative for fever, chills, night sweats, anorexia, weight loss, pain. Cardiovascular: no chest pain or dyspnea on exertion Respiratory: no cough, shortness of breath, or wheezing Neurological: no TIA or stroke symptoms Dermatological: negative ENT: negative Skin: Negative. Gastrointestinal: no abdominal pain, change in bowel habits, or black or bloody stools Genito-Urinary: no dysuria, trouble voiding, or hematuria Hematological and Lymphatic: negative Breast: negative Musculoskeletal: negative Remaining ROS negative. Physical Exam: Blood pressure 137/81, pulse 60, temperature 97 F (36.1 C), temperature source Oral, resp. rate 20, height 5\' 6"  (1.676 m), weight 220 lb 6.4 oz (99.973 kg). ECOG: 1 General appearance: alert Head: Normocephalic, without obvious abnormality, atraumatic Neck: no adenopathy, no carotid bruit, no JVD, supple, symmetrical, trachea midline and thyroid not enlarged, symmetric, no tenderness/mass/nodules Lymph nodes: Cervical, supraclavicular, and axillary nodes normal. Heart:regular rate and rhythm, S1, S2  normal, no murmur, click, rub or gallop Lung:chest clear, no wheezing, rales, normal symmetric  air entry Abdomin: soft, non-tender, without masses or organomegaly EXT:no erythema, induration, or nodules   Lab Results: Lab Results  Component Value Date   WBC 4.4 10/13/2012   HGB 11.1* 10/13/2012   HCT 32.6* 10/13/2012   MCV 87.9 10/13/2012   PLT 206 10/13/2012   CT CHEST, ABDOMEN AND PELVIS WITHOUT CONTRAST  Technique: Multidetector CT imaging of the chest, abdomen and  pelvis was performed following the standard protocol without IV  contrast.  Comparison: 01/07/2012  CT CHEST  Findings: No suspicious pulmonary nodules. No pleural effusion or  pneumothorax.  Visualized thyroid is unremarkable.  The heart is normal in size. No pericardial effusion. Coronary  atherosclerosis. Atherosclerotic calcifications of the aortic  arch.  Right chest port.  No suspicious mediastinal or axillary lymphadenopathy.  Degenerative changes of the lower thoracic spine.  IMPRESSION:  No evidence of lymphomatous involvement in the chest.  CT ABDOMEN AND PELVIS  Findings: Moderate hiatal hernia.  Unenhanced liver, spleen, pancreas, and adrenal glands within  normal limits.  Gallbladder is underdistended. No intrahepatic or extrahepatic  ductal dilatation.  Kidneys are unremarkable. No renal calculi or hydronephrosis.  No evidence of bowel obstruction. Prior small bowel resection with  anastomoses in the right mid abdomen (series 2/image 91). Mild  secondary bowel wall thickening is possible.  No evidence of abdominal aortic aneurysm.  Small retroperitoneal nodes. Small mesenteric nodes, including a  1.3 cm short axis jejunal mesenteric node (series 2/image 85),  similar.  No abdominopelvic ascites.  2.7 x 2.0 cm soft tissue lesion in the lower pelvis (series 2/image  103), previously 3.0 x 2.3 cm.  Status post hysterectomy. 2.0 x 1.6 cm left ovarian cystic lesion,  previously 1.7 x 1.4 cm.  Large fat-containing midline ventral hernia (series 2/image 78).  Degenerative changes of the  lumbar spine, most prominent at L5-S1.  IMPRESSION:  Stable small retroperitoneal/mesenteric nodes measuring up to 1.3  cm.  Additional 2.7 cm soft tissue lesion in the lower pelvis, possibly  mildly decreased.  2.0 cm left ovarian cystic lesion, possibly mildly increased.  Given postmenopausal status, annual ultrasound follow-up is  suggested.  This recommendation follows ACR consensus guidelines: White Paper  of the ACR Incidental Findings Committee II on Adnexal Findings. J  Am Coll Radiol [phone].    Impression and Plan:  This is a pleasant 67 year old female with the following issues:  1. Follicular lymphoma, stage IIA disease, presenting with a large mesenteric mass, status post systemic chemotherapy completed in December 2011.  She is currently on active surveillance. CT scan from 3/20 was discussed today and did not show any evidence of relapse. The plan is to continue follow up and repeat imaging studies in 8 months.   2. Port-A-Cath management.  We will flush her Port-A-Cath every 6 to 8 weeks.   3. Pancytopenia due to chemotherapy.  That is resolved. 4.     Mild Anemia: likely renal insufficiency related. It appears to be improving.    [facility], MD 4/25/20149:19 AM

## 2012-10-15 NOTE — Telephone Encounter (Signed)
gv and printed appt sched and avs for pt for June thru OCT

## 2012-12-14 ENCOUNTER — Ambulatory Visit (HOSPITAL_BASED_OUTPATIENT_CLINIC_OR_DEPARTMENT_OTHER): Payer: BC Managed Care – PPO

## 2012-12-14 VITALS — BP 119/73 | HR 69 | Temp 98.1°F

## 2012-12-14 DIAGNOSIS — Z452 Encounter for adjustment and management of vascular access device: Secondary | ICD-10-CM

## 2012-12-14 DIAGNOSIS — C8590 Non-Hodgkin lymphoma, unspecified, unspecified site: Secondary | ICD-10-CM

## 2012-12-14 DIAGNOSIS — C8589 Other specified types of non-Hodgkin lymphoma, extranodal and solid organ sites: Secondary | ICD-10-CM

## 2012-12-14 MED ORDER — SODIUM CHLORIDE 0.9 % IJ SOLN
10.0000 mL | INTRAMUSCULAR | Status: DC | PRN
  Administered 2012-12-14: 10 mL via INTRAVENOUS
  Filled 2012-12-14: qty 10

## 2012-12-14 MED ORDER — HEPARIN SOD (PORK) LOCK FLUSH 100 UNIT/ML IV SOLN
500.0000 [IU] | Freq: Once | INTRAVENOUS | Status: AC
  Administered 2012-12-14: 500 [IU] via INTRAVENOUS
  Filled 2012-12-14: qty 5

## 2012-12-14 NOTE — Patient Instructions (Addendum)
Call MD for problems 

## 2012-12-23 ENCOUNTER — Encounter: Payer: Self-pay | Admitting: *Deleted

## 2012-12-23 ENCOUNTER — Other Ambulatory Visit: Payer: Self-pay | Admitting: Internal Medicine

## 2012-12-23 NOTE — Telephone Encounter (Signed)
A user error has taken place: encounter opened in error, closed for administrative reasons.

## 2013-01-25 ENCOUNTER — Ambulatory Visit (HOSPITAL_BASED_OUTPATIENT_CLINIC_OR_DEPARTMENT_OTHER): Payer: BC Managed Care – PPO

## 2013-01-25 VITALS — BP 153/86 | HR 76 | Temp 97.0°F

## 2013-01-25 DIAGNOSIS — Z452 Encounter for adjustment and management of vascular access device: Secondary | ICD-10-CM

## 2013-01-25 DIAGNOSIS — C8590 Non-Hodgkin lymphoma, unspecified, unspecified site: Secondary | ICD-10-CM

## 2013-01-25 DIAGNOSIS — C8583 Other specified types of non-Hodgkin lymphoma, intra-abdominal lymph nodes: Secondary | ICD-10-CM

## 2013-01-25 MED ORDER — SODIUM CHLORIDE 0.9 % IJ SOLN
10.0000 mL | INTRAMUSCULAR | Status: DC | PRN
  Administered 2013-01-25: 10 mL via INTRAVENOUS
  Filled 2013-01-25: qty 10

## 2013-01-25 MED ORDER — HEPARIN SOD (PORK) LOCK FLUSH 100 UNIT/ML IV SOLN
500.0000 [IU] | Freq: Once | INTRAVENOUS | Status: AC
  Administered 2013-01-25: 500 [IU] via INTRAVENOUS
  Filled 2013-01-25: qty 5

## 2013-01-25 NOTE — Patient Instructions (Signed)
Implanted Port Instructions  An implanted port is a central line that has a round shape and is placed under the skin. It is used for long-term IV (intravenous) access for:  · Medicine.  · Fluids.  · Liquid nutrition, such as TPN (total parenteral nutrition).  · Blood samples.  Ports can be placed:  · In the chest area just below the collarbone (this is the most common place.)  · In the arms.  · In the belly (abdomen) area.  · In the legs.  PARTS OF THE PORT  A port has 2 main parts:  · The reservoir. The reservoir is round, disc-shaped, and will be a small, raised area under your skin.  · The reservoir is the part where a needle is inserted (accessed) to either give medicines or to draw blood.  · The catheter. The catheter is a long, slender tube that extends from the reservoir. The catheter is placed into a large vein.  · Medicine that is inserted into the reservoir goes into the catheter and then into the vein.  INSERTION OF THE PORT  · The port is surgically placed in either an operating room or in a procedural area (interventional radiology).  · Medicine may be given to help you relax during the procedure.  · The skin where the port will be inserted is numbed (local anesthetic).  · 1 or 2 small cuts (incisions) will be made in the skin to insert the port.  · The port can be used after it has been inserted.  INCISION SITE CARE  · The incision site may have small adhesive strips on it. This helps keep the incision site closed. Sometimes, no adhesive strips are placed. Instead of adhesive strips, a special kind of surgical glue is used to keep the incision closed.  · If adhesive strips were placed on the incision sites, do not take them off. They will fall off on their own.  · The incision site may be sore for 1 to 2 days. Pain medicine can help.  · Do not get the incision site wet. Bathe or shower as directed by your caregiver.  · The incision site should heal in 5 to 7 days. A small scar may form after the  incision has healed.  ACCESSING THE PORT  Special steps must be taken to access the port:  · Before the port is accessed, a numbing cream can be placed on the skin. This helps numb the skin over the port site.  · A sterile technique is used to access the port.  · The port is accessed with a needle. Only "non-coring" port needles should be used to access the port. Once the port is accessed, a blood return should be checked. This helps ensure the port is in the vein and is not clogged (clotted).  · If your caregiver believes your port should remain accessed, a clear (transparent) bandage will be placed over the needle site. The bandage and needle will need to be changed every week or as directed by your caregiver.  · Keep the bandage covering the needle clean and dry. Do not get it wet. Follow your caregiver's instructions on how to take a shower or bath when the port is accessed.  · If your port does not need to stay accessed, no bandage is needed over the port.  FLUSHING THE PORT  Flushing the port keeps it from getting clogged. How often the port is flushed depends on:  · If a   constant infusion is running. If a constant infusion is running, the port may not need to be flushed.  · If intermittent medicines are given.  · If the port is not being used.  For intermittent medicines:  · The port will need to be flushed:  · After medicines have been given.  · After blood has been drawn.  · As part of routine maintenance.  · A port is normally flushed with:  · Normal saline.  · Heparin.  · Follow your caregiver's advice on how often, how much, and the type of flush to use on your port.  IMPORTANT PORT INFORMATION  · Tell your caregiver if you are allergic to heparin.  · After your port is placed, you will get a manufacturer's information card. The card has information about your port. Keep this card with you at all times.  · There are many types of ports available. Know what kind of port you have.  · In case of an  emergency, it may be helpful to wear a medical alert bracelet. This can help alert health care workers that you have a port.  · The port can stay in for as long as your caregiver believes it is necessary.  · When it is time for the port to come out, surgery will be done to remove it. The surgery will be similar to how the port was put in.  · If you are in the hospital or clinic:  · Your port will be taken care of and flushed by a nurse.  · If you are at home:  · A home health care nurse may give medicines and take care of the port.  · You or a family member can get special training and directions for giving medicine and taking care of the port at home.  SEEK IMMEDIATE MEDICAL CARE IF:   · Your port does not flush or you are unable to get a blood return.  · New drainage or pus is coming from the incision.  · A bad smell is coming from the incision site.  · You develop swelling or increased redness at the incision site.  · You develop increased swelling or pain at the port site.  · You develop swelling or pain in the surrounding skin near the port.  · You have an oral temperature above 102° F (38.9° C), not controlled by medicine.  MAKE SURE YOU:   · Understand these instructions.  · Will watch your condition.  · Will get help right away if you are not doing well or get worse.  Document Released: 06/09/2005 Document Revised: 09/01/2011 Document Reviewed: 08/31/2008  [employer]® Patient Information ©2014 [employer].

## 2013-01-28 ENCOUNTER — Telehealth: Payer: Self-pay | Admitting: *Deleted

## 2013-01-28 NOTE — Telephone Encounter (Signed)
sw pt informed her that the doctor will be out of the office on 02/15/13. gv appt d/t for 02/22/13 labs@ 9:45am and ov @ 10:15am. Pt is aware...td

## 2013-02-15 ENCOUNTER — Other Ambulatory Visit: Payer: BC Managed Care – PPO | Admitting: Lab

## 2013-02-15 ENCOUNTER — Ambulatory Visit: Payer: BC Managed Care – PPO | Admitting: Oncology

## 2013-02-22 ENCOUNTER — Other Ambulatory Visit (HOSPITAL_BASED_OUTPATIENT_CLINIC_OR_DEPARTMENT_OTHER): Payer: Medicare (Managed Care) | Admitting: Lab

## 2013-02-22 ENCOUNTER — Ambulatory Visit (HOSPITAL_BASED_OUTPATIENT_CLINIC_OR_DEPARTMENT_OTHER): Payer: Medicare (Managed Care) | Admitting: Oncology

## 2013-02-22 ENCOUNTER — Telehealth: Payer: Self-pay | Admitting: Oncology

## 2013-02-22 VITALS — BP 123/67 | HR 63 | Temp 97.9°F | Resp 18 | Ht 66.0 in | Wt 219.1 lb

## 2013-02-22 DIAGNOSIS — C8583 Other specified types of non-Hodgkin lymphoma, intra-abdominal lymph nodes: Secondary | ICD-10-CM

## 2013-02-22 DIAGNOSIS — C8590 Non-Hodgkin lymphoma, unspecified, unspecified site: Secondary | ICD-10-CM

## 2013-02-22 DIAGNOSIS — D649 Anemia, unspecified: Secondary | ICD-10-CM

## 2013-02-22 LAB — CBC WITH DIFFERENTIAL/PLATELET
BASO%: 0.6 % (ref 0.0–2.0)
Basophils Absolute: 0 10*3/uL (ref 0.0–0.1)
Eosinophils Absolute: 0.1 10*3/uL (ref 0.0–0.5)
HCT: 32.4 % — ABNORMAL LOW (ref 34.8–46.6)
HGB: 11.1 g/dL — ABNORMAL LOW (ref 11.6–15.9)
MCHC: 34.2 g/dL (ref 31.5–36.0)
MONO#: 0.4 10*3/uL (ref 0.1–0.9)
NEUT#: 2.2 10*3/uL (ref 1.5–6.5)
NEUT%: 50 % (ref 38.4–76.8)
WBC: 4.4 10*3/uL (ref 3.9–10.3)
lymph#: 1.7 10*3/uL (ref 0.9–3.3)

## 2013-02-22 LAB — COMPREHENSIVE METABOLIC PANEL (CC13)
ALT: 10 U/L (ref 0–55)
CO2: 23 mEq/L (ref 22–29)
Calcium: 9.5 mg/dL (ref 8.4–10.4)
Chloride: 111 mEq/L — ABNORMAL HIGH (ref 98–109)
Creatinine: 2.7 mg/dL — ABNORMAL HIGH (ref 0.6–1.1)
Glucose: 72 mg/dl (ref 70–140)

## 2013-02-22 MED ORDER — HEPARIN SOD (PORK) LOCK FLUSH 100 UNIT/ML IV SOLN
500.0000 [IU] | Freq: Once | INTRAVENOUS | Status: AC
  Administered 2013-02-22: 500 [IU] via INTRAVENOUS
  Filled 2013-02-22: qty 5

## 2013-02-22 MED ORDER — SODIUM CHLORIDE 0.9 % IJ SOLN
10.0000 mL | INTRAMUSCULAR | Status: DC | PRN
  Administered 2013-02-22: 10 mL via INTRAVENOUS
  Filled 2013-02-22: qty 10

## 2013-02-22 NOTE — Progress Notes (Signed)
Hematology and Oncology Follow Up Visit  Christine Morales [id number] [birth year] 67 y.o. 02/22/2013 10:22 AM  Principle Diagnosis: This is a 67 year old female with follicular lymphoma, non-Hodgkin type, stage IIA, presented with a large mesenteric mass in July 2011.  Prior Therapy: The patient received 5 cycles of bendamustine and rituximab, finished in December 2011.  She had a complete response at that time.  Current therapy: Observation and surveillance.   Interim History:  Christine Morales presents today for a followup visit.  A very pleasant 67 year old female with diagnosis of follicular lymphoma, treated with bendamustine and rituximab as mentioned.  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  Clinically since the last time I saw her, she had been doing very well.  She did not report any nausea, did not report any vomiting.  Had not reported any specific complaints.  Did not report any fevers, did not report any chills. She report mild fatigue and has retired from work at this time.   Medications: I have reviewed the patient's current medications.  Current Outpatient Prescriptions  Medication Sig Dispense Refill  . amLODipine (NORVASC) 5 MG tablet take 1 tablet by mouth once daily  90 tablet  2  . Cholecalciferol (VITAMIN D3) 1000 UNITS tablet Take 1,000 Units by mouth daily.        . ciprofloxacin (CIPRO) 500 MG tablet Take 1 tablet (500 mg total) by mouth 2 (two) times daily.  20 tablet  0  . cyanocobalamin (,VITAMIN B-12,) 1000 MCG/ML injection Inject 1 mL (1,000 mcg total) into the muscle once.  10 mL  6  . ferrous sulfate (FERRO-BOB) 325 (65 FE) MG tablet Take 325 mg by mouth daily with breakfast.        . losartan (COZAAR) 100 MG tablet Take 1 tablet (100 mg total) by mouth daily.  30 tablet  11  . Syringe/Needle, Disp, (B-D ECLIPSE SYRINGE) 30G X 1/2" 1 ML MISC 1 each  by Does not apply route 1 day or 1 dose.  50 each  11   Current Facility-Administered Medications  Medication Dose Route Frequency Provider Last Rate Last Dose  . heparin lock flush 100 unit/mL  500 Units Intravenous Once Benjiman Core, MD      . sodium chloride 0.9 % injection 10 mL  10 mL Intravenous PRN Benjiman Core, MD       Facility-Administered Medications Ordered in Other Visits  Medication Dose Route Frequency Provider Last Rate Last Dose  . heparin lock flush 100 unit/mL  500 Units Intravenous Once Benjiman Core, MD      . sodium chloride 0.9 % injection 10 mL  10 mL Intravenous PRN Benjiman Core, MD        Allergies: No Known Allergies  Past Medical History, Surgical history, Social history, and Family History were reviewed and updated.  Review of Systems:  Remaining ROS negative. Physical Exam: Blood pressure 123/67, pulse 63, temperature 97.9 F (36.6 C), temperature source Oral, resp. rate 18, height 5\' 6"  (1.676 m), weight 219 lb 1.6 oz (99.383 kg). ECOG: 1 General appearance: alert Head: Normocephalic, without obvious abnormality, atraumatic Neck: no adenopathy, no carotid bruit, no JVD, supple, symmetrical, trachea midline and thyroid not enlarged, symmetric, no tenderness/mass/nodules Lymph nodes: Cervical, supraclavicular, and axillary nodes normal. Heart:regular rate and rhythm, S1, S2 normal, no murmur, click, rub or gallop Lung:chest clear, no  wheezing, rales, normal symmetric air entry Abdomin: soft, non-tender, without masses or organomegaly EXT:no erythema, induration, or nodules   Lab Results: Lab Results  Component Value Date   WBC 4.4 02/22/2013   HGB 11.1* 02/22/2013   HCT 32.4* 02/22/2013   MCV 90.3 02/22/2013   PLT 209 02/22/2013    Impression and Plan:  This is a pleasant 67 year old female with the following issues:  1. Follicular lymphoma, stage IIA disease, presenting with a large mesenteric mass, status post systemic chemotherapy completed  in December 2011.  She is currently on active surveillance. CT scan from 3/20  did not show any evidence of relapse. The plan is to continue follow up and repeat imaging studies in 4 months.   2. Port-A-Cath management.  We will flush her Port-A-Cath every 6 to 8 weeks.   3. Pancytopenia due to chemotherapy.  That is resolved. 4.     Mild Anemia: likely renal insufficiency related and stable.    [facility], MD 9/2/201410:22 AM

## 2013-02-22 NOTE — Telephone Encounter (Signed)
gv and printed appt sched and avs for pt for OCT, Nov and Jan...gv pt barium

## 2013-03-01 ENCOUNTER — Other Ambulatory Visit: Payer: Medicare Other

## 2013-03-02 ENCOUNTER — Ambulatory Visit (INDEPENDENT_AMBULATORY_CARE_PROVIDER_SITE_OTHER): Payer: BC Managed Care – PPO | Admitting: Internal Medicine

## 2013-03-02 ENCOUNTER — Encounter: Payer: Self-pay | Admitting: Internal Medicine

## 2013-03-02 VITALS — BP 128/78 | HR 72 | Temp 97.8°F | Resp 16 | Wt 220.0 lb

## 2013-03-02 DIAGNOSIS — N189 Chronic kidney disease, unspecified: Secondary | ICD-10-CM

## 2013-03-02 DIAGNOSIS — I10 Essential (primary) hypertension: Secondary | ICD-10-CM

## 2013-03-02 DIAGNOSIS — J309 Allergic rhinitis, unspecified: Secondary | ICD-10-CM

## 2013-03-02 DIAGNOSIS — E559 Vitamin D deficiency, unspecified: Secondary | ICD-10-CM

## 2013-03-02 DIAGNOSIS — Z0000 Encounter for general adult medical examination without abnormal findings: Secondary | ICD-10-CM

## 2013-03-02 DIAGNOSIS — R634 Abnormal weight loss: Secondary | ICD-10-CM

## 2013-03-02 DIAGNOSIS — D509 Iron deficiency anemia, unspecified: Secondary | ICD-10-CM

## 2013-03-02 DIAGNOSIS — E538 Deficiency of other specified B group vitamins: Secondary | ICD-10-CM

## 2013-03-02 DIAGNOSIS — C8583 Other specified types of non-Hodgkin lymphoma, intra-abdominal lymph nodes: Secondary | ICD-10-CM

## 2013-03-02 NOTE — Assessment & Plan Note (Signed)
Continue with current prescription therapy as reflected on the Med list.  

## 2013-03-02 NOTE — Assessment & Plan Note (Signed)
Monitoring

## 2013-03-02 NOTE — Assessment & Plan Note (Signed)
Xx

## 2013-03-02 NOTE — Assessment & Plan Note (Signed)
No relapse 

## 2013-03-02 NOTE — Assessment & Plan Note (Signed)
CBC

## 2013-03-02 NOTE — Assessment & Plan Note (Signed)
Stable

## 2013-03-02 NOTE — Progress Notes (Signed)
   Subjective:     HPI   The patient presents for a follow-up of  chronic hypertension, chronic dyslipidemia, B12 def controlled with medicines. H/o lymphoma   Wt Readings from Last 3 Encounters:  03/02/13 220 lb (99.791 kg)  02/22/13 219 lb 1.6 oz (99.383 kg)  10/15/12 220 lb 6.4 oz (99.973 kg)   BP Readings from Last 3 Encounters:  03/02/13 128/78  02/22/13 123/67  01/25/13 153/86        Review of Systems  Constitutional: Positive for unexpected weight change (wt gain). Negative for fever, chills, diaphoresis, activity change, appetite change and fatigue.  HENT: Negative for hearing loss, ear pain, nosebleeds, congestion, sore throat, facial swelling, rhinorrhea, sneezing, mouth sores, trouble swallowing, neck pain, neck stiffness, postnasal drip, sinus pressure and tinnitus.   Eyes: Negative for pain, discharge, redness, itching and visual disturbance.  Respiratory: Negative for cough, chest tightness, shortness of breath, wheezing and stridor.   Cardiovascular: Negative for chest pain, palpitations and leg swelling.  Gastrointestinal: Positive for abdominal pain and abdominal distention. Negative for nausea, diarrhea, constipation, blood in stool, anal bleeding and rectal pain.  Genitourinary: Negative for dysuria, urgency, frequency, hematuria, flank pain, vaginal bleeding, vaginal discharge, difficulty urinating, genital sores and pelvic pain.  Musculoskeletal: Negative for back pain, joint swelling, arthralgias and gait problem.  Skin: Negative.  Negative for rash.  Neurological: Negative for dizziness, tremors, seizures, syncope, speech difficulty, weakness, numbness and headaches.  Hematological: Negative for adenopathy. Does not bruise/bleed easily.  Psychiatric/Behavioral: Negative for suicidal ideas, behavioral problems, sleep disturbance, dysphoric mood and decreased concentration. The patient is not nervous/anxious.        Objective:   Physical Exam   Constitutional: She appears well-developed. No distress.  Obese  HENT:  Head: Normocephalic.  Right Ear: External ear normal.  Left Ear: External ear normal.  Nose: Nose normal.  Mouth/Throat: Oropharynx is clear and moist.  Eyes: Conjunctivae are normal. Pupils are equal, round, and reactive to light. Right eye exhibits no discharge. Left eye exhibits no discharge.  Neck: Normal range of motion. Neck supple. No JVD present. No tracheal deviation present. No thyromegaly present.  Cardiovascular: Normal rate, regular rhythm and normal heart sounds.   Pulmonary/Chest: No stridor. No respiratory distress. She has no wheezes. She exhibits no tenderness.  Abdominal: Soft. Bowel sounds are normal. She exhibits no distension and no mass. There is tenderness (mild in LLQ). There is no rebound and no guarding.  Musculoskeletal: She exhibits no edema and no tenderness.  Lymphadenopathy:    She has no cervical adenopathy.  Neurological: She displays normal reflexes. No cranial nerve deficit. She exhibits normal muscle tone. Coordination normal.  Skin: No rash noted. No erythema.  Psychiatric: She has a normal mood and affect. Her behavior is normal. Judgment and thought content normal.    Lab Results  Component Value Date   WBC 4.4 02/22/2013   HGB 11.1* 02/22/2013   HCT 32.4* 02/22/2013   PLT 209 02/22/2013   GLUCOSE 72 02/22/2013   CHOL 187 03/05/2012   TRIG 128.0 03/05/2012   HDL 46.00 03/05/2012   LDLCALC 115* 03/05/2012   ALT 10 02/22/2013   AST 18 02/22/2013   NA 144 02/22/2013   K 4.5 02/22/2013   CL 109* 10/13/2012   CREATININE 2.7* 02/22/2013   BUN 25.1 02/22/2013   CO2 23 02/22/2013   TSH 1.87 03/05/2012   INR 1.08 01/27/2010         Assessment & Plan:

## 2013-04-06 ENCOUNTER — Ambulatory Visit (HOSPITAL_BASED_OUTPATIENT_CLINIC_OR_DEPARTMENT_OTHER): Payer: Medicare (Managed Care)

## 2013-04-06 ENCOUNTER — Telehealth: Payer: Self-pay | Admitting: Oncology

## 2013-04-06 ENCOUNTER — Encounter (INDEPENDENT_AMBULATORY_CARE_PROVIDER_SITE_OTHER): Payer: Self-pay

## 2013-04-06 VITALS — BP 124/70 | HR 68 | Temp 98.4°F | Resp 16

## 2013-04-06 DIAGNOSIS — C8590 Non-Hodgkin lymphoma, unspecified, unspecified site: Secondary | ICD-10-CM

## 2013-04-06 DIAGNOSIS — C8589 Other specified types of non-Hodgkin lymphoma, extranodal and solid organ sites: Secondary | ICD-10-CM

## 2013-04-06 MED ORDER — HEPARIN SOD (PORK) LOCK FLUSH 100 UNIT/ML IV SOLN
500.0000 [IU] | Freq: Once | INTRAVENOUS | Status: AC
  Administered 2013-04-06: 500 [IU] via INTRAVENOUS
  Filled 2013-04-06: qty 5

## 2013-04-06 MED ORDER — SODIUM CHLORIDE 0.9 % IJ SOLN
10.0000 mL | INTRAMUSCULAR | Status: DC | PRN
  Administered 2013-04-06: 10 mL via INTRAVENOUS
  Filled 2013-04-06: qty 10

## 2013-04-06 NOTE — Telephone Encounter (Signed)
pt missed flush yesterday r/s today....done

## 2013-05-03 ENCOUNTER — Encounter: Payer: Self-pay | Admitting: Internal Medicine

## 2013-05-17 ENCOUNTER — Encounter: Payer: Self-pay | Admitting: Internal Medicine

## 2013-05-17 ENCOUNTER — Ambulatory Visit: Payer: Medicare (Managed Care) | Admitting: Internal Medicine

## 2013-05-17 ENCOUNTER — Ambulatory Visit (INDEPENDENT_AMBULATORY_CARE_PROVIDER_SITE_OTHER): Payer: Medicare (Managed Care) | Admitting: Internal Medicine

## 2013-05-17 ENCOUNTER — Ambulatory Visit (HOSPITAL_BASED_OUTPATIENT_CLINIC_OR_DEPARTMENT_OTHER): Payer: Medicare (Managed Care)

## 2013-05-17 VITALS — BP 102/62 | HR 65 | Temp 97.2°F | Wt 216.0 lb

## 2013-05-17 VITALS — BP 126/75 | HR 64 | Temp 97.0°F

## 2013-05-17 DIAGNOSIS — C8590 Non-Hodgkin lymphoma, unspecified, unspecified site: Secondary | ICD-10-CM

## 2013-05-17 DIAGNOSIS — Z452 Encounter for adjustment and management of vascular access device: Secondary | ICD-10-CM

## 2013-05-17 DIAGNOSIS — S43499A Other sprain of unspecified shoulder joint, initial encounter: Secondary | ICD-10-CM

## 2013-05-17 DIAGNOSIS — C8589 Other specified types of non-Hodgkin lymphoma, extranodal and solid organ sites: Secondary | ICD-10-CM

## 2013-05-17 DIAGNOSIS — S46812A Strain of other muscles, fascia and tendons at shoulder and upper arm level, left arm, initial encounter: Secondary | ICD-10-CM

## 2013-05-17 MED ORDER — SODIUM CHLORIDE 0.9 % IJ SOLN
10.0000 mL | INTRAMUSCULAR | Status: DC | PRN
  Administered 2013-05-17: 10 mL via INTRAVENOUS
  Filled 2013-05-17: qty 10

## 2013-05-17 MED ORDER — HEPARIN SOD (PORK) LOCK FLUSH 100 UNIT/ML IV SOLN
500.0000 [IU] | Freq: Once | INTRAVENOUS | Status: AC
  Administered 2013-05-17: 500 [IU] via INTRAVENOUS
  Filled 2013-05-17: qty 5

## 2013-05-17 MED ORDER — PREDNISONE 10 MG PO TABS: 10.0000 mg | ORAL_TABLET | Freq: Every day | ORAL | Status: DC

## 2013-05-17 NOTE — Progress Notes (Signed)
Pre visit review using our clinic review tool, if applicable. No additional management support is needed unless otherwise documented below in the visit note. 

## 2013-05-17 NOTE — Patient Instructions (Signed)
Implanted Port Instructions  An implanted port is a central line that has a round shape and is placed under the skin. It is used for long-term IV (intravenous) access for:  · Medicine.  · Fluids.  · Liquid nutrition, such as TPN (total parenteral nutrition).  · Blood samples.  Ports can be placed:  · In the chest area just below the collarbone (this is the most common place.)  · In the arms.  · In the belly (abdomen) area.  · In the legs.  PARTS OF THE PORT  A port has 2 main parts:  · The reservoir. The reservoir is round, disc-shaped, and will be a small, raised area under your skin.  · The reservoir is the part where a needle is inserted (accessed) to either give medicines or to draw blood.  · The catheter. The catheter is a long, slender tube that extends from the reservoir. The catheter is placed into a large vein.  · Medicine that is inserted into the reservoir goes into the catheter and then into the vein.  INSERTION OF THE PORT  · The port is surgically placed in either an operating room or in a procedural area (interventional radiology).  · Medicine may be given to help you relax during the procedure.  · The skin where the port will be inserted is numbed (local anesthetic).  · 1 or 2 small cuts (incisions) will be made in the skin to insert the port.  · The port can be used after it has been inserted.  INCISION SITE CARE  · The incision site may have small adhesive strips on it. This helps keep the incision site closed. Sometimes, no adhesive strips are placed. Instead of adhesive strips, a special kind of surgical glue is used to keep the incision closed.  · If adhesive strips were placed on the incision sites, do not take them off. They will fall off on their own.  · The incision site may be sore for 1 to 2 days. Pain medicine can help.  · Do not get the incision site wet. Bathe or shower as directed by your caregiver.  · The incision site should heal in 5 to 7 days. A small scar may form after the  incision has healed.  ACCESSING THE PORT  Special steps must be taken to access the port:  · Before the port is accessed, a numbing cream can be placed on the skin. This helps numb the skin over the port site.  · A sterile technique is used to access the port.  · The port is accessed with a needle. Only "non-coring" port needles should be used to access the port. Once the port is accessed, a blood return should be checked. This helps ensure the port is in the vein and is not clogged (clotted).  · If your caregiver believes your port should remain accessed, a clear (transparent) bandage will be placed over the needle site. The bandage and needle will need to be changed every week or as directed by your caregiver.  · Keep the bandage covering the needle clean and dry. Do not get it wet. Follow your caregiver's instructions on how to take a shower or bath when the port is accessed.  · If your port does not need to stay accessed, no bandage is needed over the port.  FLUSHING THE PORT  Flushing the port keeps it from getting clogged. How often the port is flushed depends on:  · If a   constant infusion is running. If a constant infusion is running, the port may not need to be flushed.  · If intermittent medicines are given.  · If the port is not being used.  For intermittent medicines:  · The port will need to be flushed:  · After medicines have been given.  · After blood has been drawn.  · As part of routine maintenance.  · A port is normally flushed with:  · Normal saline.  · Heparin.  · Follow your caregiver's advice on how often, how much, and the type of flush to use on your port.  IMPORTANT PORT INFORMATION  · Tell your caregiver if you are allergic to heparin.  · After your port is placed, you will get a manufacturer's information card. The card has information about your port. Keep this card with you at all times.  · There are many types of ports available. Know what kind of port you have.  · In case of an  emergency, it may be helpful to wear a medical alert bracelet. This can help alert health care workers that you have a port.  · The port can stay in for as long as your caregiver believes it is necessary.  · When it is time for the port to come out, surgery will be done to remove it. The surgery will be similar to how the port was put in.  · If you are in the hospital or clinic:  · Your port will be taken care of and flushed by a nurse.  · If you are at home:  · A home health care nurse may give medicines and take care of the port.  · You or a family member can get special training and directions for giving medicine and taking care of the port at home.  SEEK IMMEDIATE MEDICAL CARE IF:   · Your port does not flush or you are unable to get a blood return.  · New drainage or pus is coming from the incision.  · A bad smell is coming from the incision site.  · You develop swelling or increased redness at the incision site.  · You develop increased swelling or pain at the port site.  · You develop swelling or pain in the surrounding skin near the port.  · You have an oral temperature above 102° F (38.9° C), not controlled by medicine.  MAKE SURE YOU:   · Understand these instructions.  · Will watch your condition.  · Will get help right away if you are not doing well or get worse.  Document Released: 06/09/2005 Document Revised: 09/01/2011 Document Reviewed: 08/31/2008  [employer]® Patient Information ©2014 [employer].

## 2013-05-17 NOTE — Patient Instructions (Signed)
Muscle strain of the left Deltoid muscle. The joint is normal on exam and with range of motion. There is no evidence of any neck or disk issues.  Plan Prednisone burst and taper: an anti-inflammatory drug safe for you then Advil  Massage and heat will help: rub of choice, heat patch  Gentle range of motion  Cut back on the amount of weight you exercise with.    Muscle Strain Muscle strain occurs when a muscle is stretched beyond its normal length. A small number of muscle fibers generally are torn. This is especially common in athletes. This happens when a sudden, violent force placed on a muscle stretches it too far. Usually, recovery from muscle strain takes 1 to 2 weeks. Complete healing will take 5 to 6 weeks.  HOME CARE INSTRUCTIONS   While awake, apply ice to the sore muscle for the first 2 days after the injury.  Put ice in a plastic bag.  Place a towel between your skin and the bag.  Leave the ice on for 15-20 minutes each hour.  Do not use the strained muscle for several days, until you no longer have pain.  You may wrap the injured area with an elastic bandage for comfort. Be careful not to wrap it too tightly. This may interfere with blood circulation or increase swelling.  Only take over-the-counter or prescription medicines for pain, discomfort, or fever as directed by your caregiver. SEEK MEDICAL CARE IF:  You have increasing pain or swelling in the injured area. MAKE SURE YOU:   Understand these instructions.  Will watch your condition.  Will get help right away if you are not doing well or get worse. Document Released: 06/09/2005 Document Revised: 09/01/2011 Document Reviewed: 06/21/2011 [facility] Patient Information 2014 [place], [place].

## 2013-05-17 NOTE — Progress Notes (Signed)
Subjective:    Patient ID: Christine Morales, female    DOB: 02/16/1946, 67 y.o.   MRN: [id number]  HPI [name] presnts for a sore shoulder for the past week. She thinks this occurred while working out. She has not seen relief with APAP and Advil does seem to help more. She has had mild paresthesia. She does have a little left sided neck pain.  Past Medical History  Diagnosis Date  . Hypertension   . Anemia     iron def  . Ulcer     peptic  . Rhinitis, allergic   . Helicobacter pylori ab+   . Hemorrhoids   . 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 nhl dx'd 12/2009    chemo comp 2011   Past Surgical History  Procedure Laterality Date  . Abdominal hysterectomy    . Tubal ligation    . Breast reduction surgery    . Partial colectomy      dr blackmon   Family History  Problem Relation Age of Onset  . Hypertension      FH  . Cholelithiasis Neg Hx   . Cancer Father   . Heart disease Sister    History   Social History  . Marital Status: Married    Spouse Name: Christine Morales    Number of Children: 2  . Years of Education: Christine Morales   Occupational History  . retired [employer]   Social History Main Topics  . Smoking status: Never Smoker   . Smokeless tobacco: Not on file  . Alcohol Use: No  . Drug Use: No  . Sexual Activity: Not on file   Other Topics Concern  . Not on file   Social History Narrative  . No narrative on file     Current Outpatient Prescriptions on File Prior to Visit  Medication Sig Dispense Refill  . amLODipine (NORVASC) 5 MG tablet take 1 tablet by mouth once daily  90 tablet  2  . Cholecalciferol (VITAMIN D3) 1000 UNITS tablet Take 1,000 Units by mouth daily.        . ciprofloxacin (CIPRO) 500 MG tablet Take 1 tablet (500 mg total) by mouth 2 (two) times daily.  20 tablet  0  . cyanocobalamin (,VITAMIN B-12,) 1000 MCG/ML injection Inject 1 mL (1,000 mcg total) into the muscle once.  10 mL  6   . ferrous sulfate (FERRO-BOB) 325 (65 FE) MG tablet Take 325 mg by mouth daily with breakfast.        . losartan (COZAAR) 100 MG tablet Take 1 tablet (100 mg total) by mouth daily.  30 tablet  11  . Syringe/Needle, Disp, (B-D ECLIPSE SYRINGE) 30G X 1/2" 1 ML MISC 1 each by Does not apply route 1 day or 1 dose.  50 each  11   Current Facility-Administered Medications on File Prior to Visit  Medication Dose Route Frequency Provider Last Rate Last Dose  . heparin lock flush 100 unit/mL  500 Units Intravenous Once Benjiman Core, MD      . sodium chloride 0.9 % injection 10 mL  10 mL Intravenous PRN Benjiman Core, MD           Review of Systems System review is negative for any constitutional, cardiac, pulmonary, GI or neuro symptoms or complaints other than as described in the HPI.     Objective:   Physical Exam  Filed Vitals:  05/17/13 1204  BP: 102/62  Pulse: 65  Temp: 97.2 F (36.2 C)   Gen'l  WNWD woman in no distress Cor - RRR PUlm - normal MSK - normal active and passive ROM left shoulder, no crepitus, no click. Tender to palpation of the deltoid.  Neuro - normal DTRs left UE, normal MS, normal sensation      Assessment & Plan:  Muscle strain of the left Deltoid muscle. The joint is normal on exam and with range of motion. There is no evidence of any neck or disk issues.  Plan Prednisone burst and taper: an anti-inflammatory drug safer for you then Advil  Massage and heat will help: rub of choice, heat patch  Gentle range of motion  Cut back on the amount of weight you exercise with.

## 2013-06-28 ENCOUNTER — Telehealth: Payer: Self-pay | Admitting: Oncology

## 2013-06-28 ENCOUNTER — Other Ambulatory Visit (HOSPITAL_BASED_OUTPATIENT_CLINIC_OR_DEPARTMENT_OTHER): Payer: Medicare Other

## 2013-06-28 ENCOUNTER — Ambulatory Visit (HOSPITAL_COMMUNITY)
Admission: RE | Admit: 2013-06-28 | Discharge: 2013-06-28 | Disposition: A | Discharge status: Home / Self Care | Payer: Medicare Other | Source: Ambulatory Visit | Attending: Oncology | Admitting: Oncology

## 2013-06-28 ENCOUNTER — Ambulatory Visit (HOSPITAL_BASED_OUTPATIENT_CLINIC_OR_DEPARTMENT_OTHER): Payer: Medicare Other

## 2013-06-28 VITALS — BP 137/85 | HR 68 | Temp 96.7°F

## 2013-06-28 DIAGNOSIS — C8589 Other specified types of non-Hodgkin lymphoma, extranodal and solid organ sites: Secondary | ICD-10-CM

## 2013-06-28 DIAGNOSIS — Z452 Encounter for adjustment and management of vascular access device: Secondary | ICD-10-CM

## 2013-06-28 DIAGNOSIS — C8590 Non-Hodgkin lymphoma, unspecified, unspecified site: Secondary | ICD-10-CM

## 2013-06-28 DIAGNOSIS — K439 Ventral hernia without obstruction or gangrene: Secondary | ICD-10-CM | POA: Insufficient documentation

## 2013-06-28 DIAGNOSIS — K449 Diaphragmatic hernia without obstruction or gangrene: Secondary | ICD-10-CM | POA: Insufficient documentation

## 2013-06-28 DIAGNOSIS — Z95828 Presence of other vascular implants and grafts: Secondary | ICD-10-CM

## 2013-06-28 LAB — COMPREHENSIVE METABOLIC PANEL (CC13)
ALBUMIN: 3.8 g/dL (ref 3.5–5.0)
ALK PHOS: 75 U/L (ref 40–150)
ALT: 12 U/L (ref 0–55)
AST: 16 U/L (ref 5–34)
Anion Gap: 9 mEq/L (ref 3–11)
BILIRUBIN TOTAL: 0.46 mg/dL (ref 0.20–1.20)
BUN: 26.7 mg/dL — AB (ref 7.0–26.0)
CO2: 25 mEq/L (ref 22–29)
CREATININE: 2.1 mg/dL — AB (ref 0.6–1.1)
Calcium: 9.4 mg/dL (ref 8.4–10.4)
Chloride: 108 mEq/L (ref 98–109)
GLUCOSE: 93 mg/dL (ref 70–140)
POTASSIUM: 4.6 meq/L (ref 3.5–5.1)
Sodium: 142 mEq/L (ref 136–145)
Total Protein: 7.1 g/dL (ref 6.4–8.3)

## 2013-06-28 LAB — CBC WITH DIFFERENTIAL/PLATELET
BASO%: 0.3 % (ref 0.0–2.0)
BASOS ABS: 0 10*3/uL (ref 0.0–0.1)
EOS ABS: 0.1 10*3/uL (ref 0.0–0.5)
EOS%: 2.4 % (ref 0.0–7.0)
HCT: 33.2 % — ABNORMAL LOW (ref 34.8–46.6)
HEMOGLOBIN: 11.3 g/dL — AB (ref 11.6–15.9)
LYMPH%: 37.7 % (ref 14.0–49.7)
MCH: 30.5 pg (ref 25.1–34.0)
MCHC: 34 g/dL (ref 31.5–36.0)
MCV: 89.8 fL (ref 79.5–101.0)
MONO#: 0.4 10*3/uL (ref 0.1–0.9)
MONO%: 8.1 % (ref 0.0–14.0)
NEUT%: 51.5 % (ref 38.4–76.8)
NEUTROS ABS: 2.3 10*3/uL (ref 1.5–6.5)
PLATELETS: 217 10*3/uL (ref 145–400)
RBC: 3.69 10*6/uL — ABNORMAL LOW (ref 3.70–5.45)
RDW: 14.4 % (ref 11.2–14.5)
WBC: 4.5 10*3/uL (ref 3.9–10.3)
lymph#: 1.7 10*3/uL (ref 0.9–3.3)

## 2013-06-28 IMAGING — CT CT ABD-PELV W/O CM
2 of 4 series · 17 of 46 positions shown, 19 images · non-contrast
Comparison: CT ABD/PELV WO CM dated [DATE]

CLINICAL DATA: Non-Hodgkin's lymphoma follow-up.

EXAM:
CT CHEST, ABDOMEN AND PELVIS WITHOUT CONTRAST
TECHNIQUE: Multidetector CT imaging of the chest, abdomen and pelvis was
performed following the standard protocol without IV contrast.

[Series 2: cap w/o w/o st · axial · non-contrast · 0.68mm/px · z∈[-680,-146]mm · 14 of 119 slices shown, 16 images]
[im 6/119  soft-tissue]
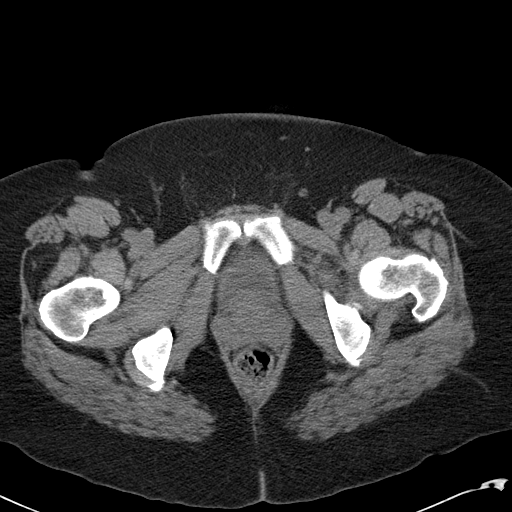
[im 6/119  bone]
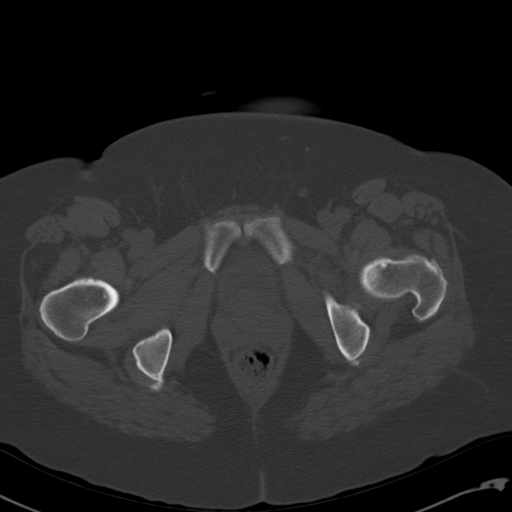
[im 18/119  soft-tissue]
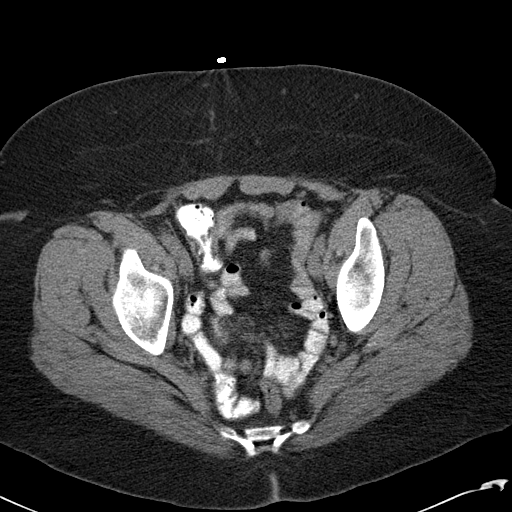
[im 24/119  soft-tissue]
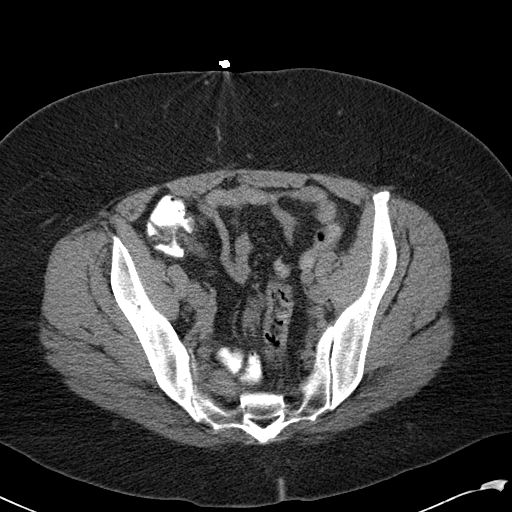
[im 30/119  soft-tissue]
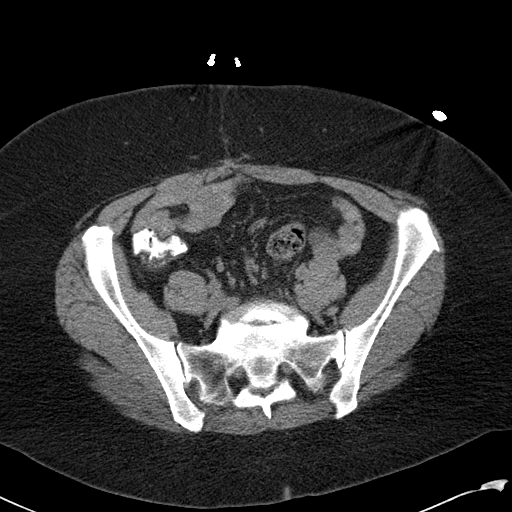
[im 42/119  soft-tissue]
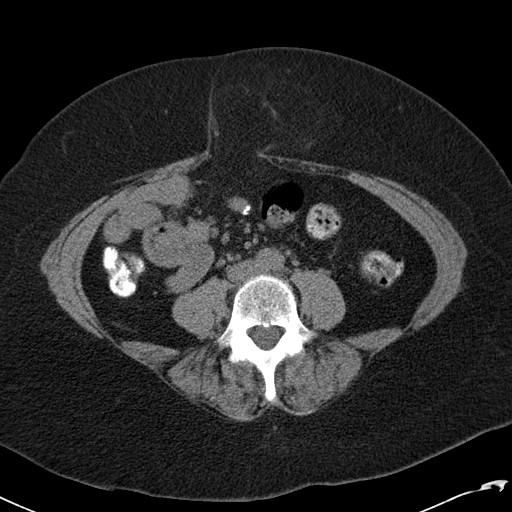
[im 48/119  soft-tissue]
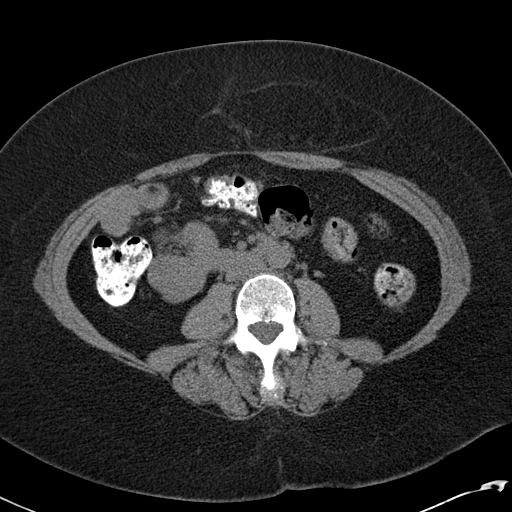
[im 54/119  soft-tissue]
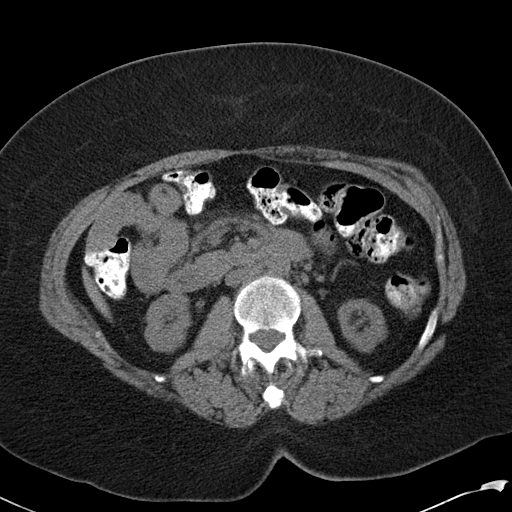
[im 65/119  soft-tissue]
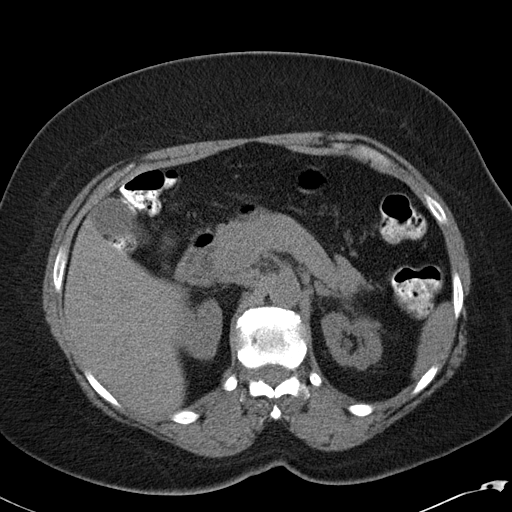
[im 71/119  soft-tissue]
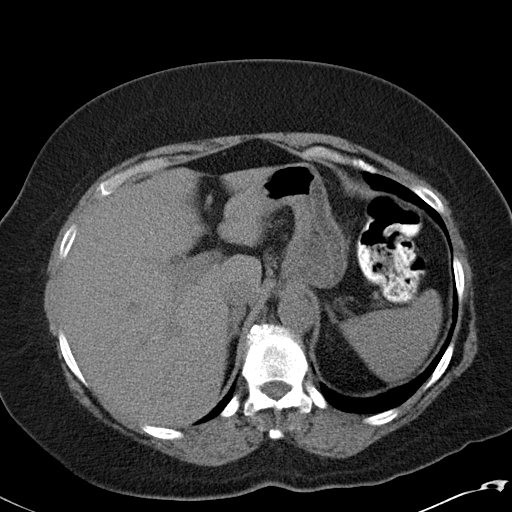
[im 71/119  bone]
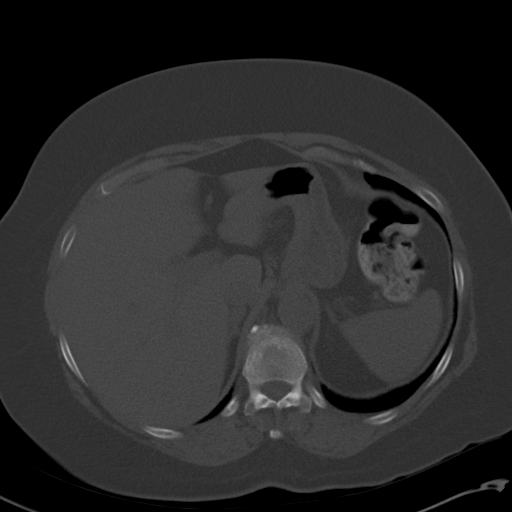
[im 77/119  soft-tissue]
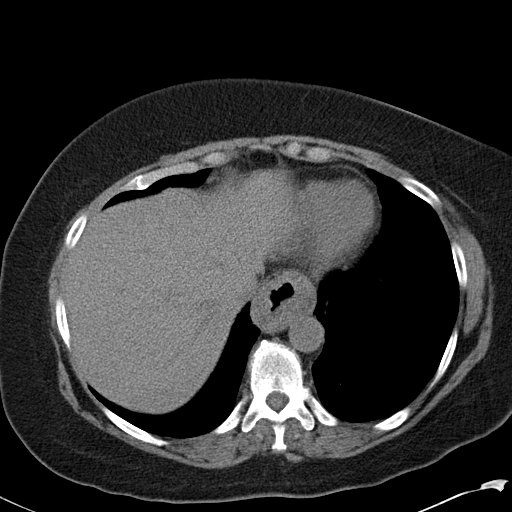
[im 89/119  soft-tissue]
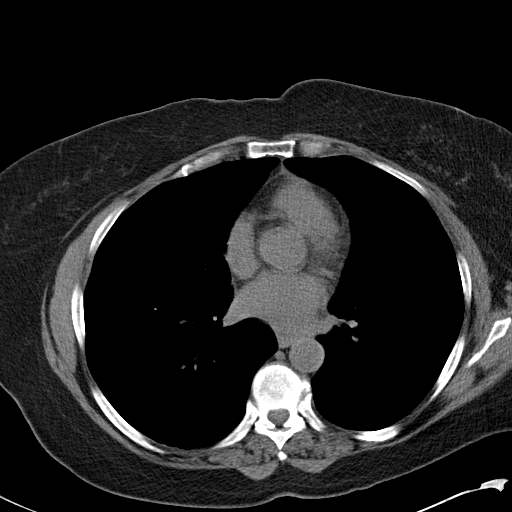
[im 95/119  soft-tissue]
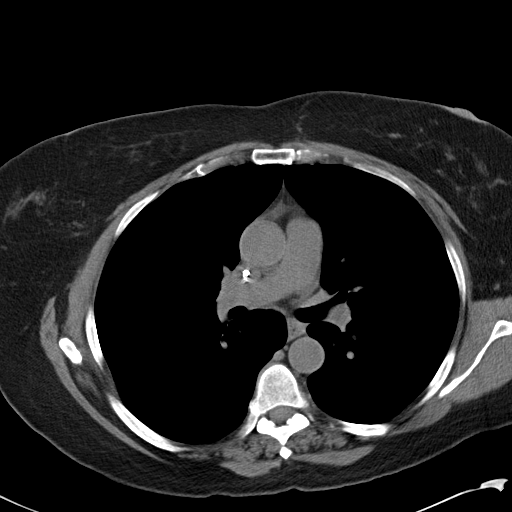
[im 101/119  soft-tissue]
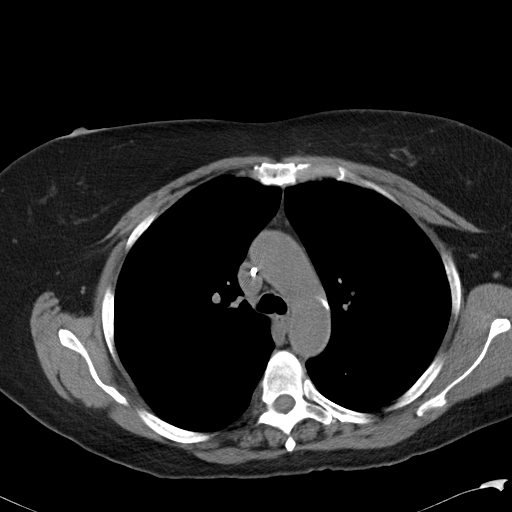
[im 113/119  soft-tissue]
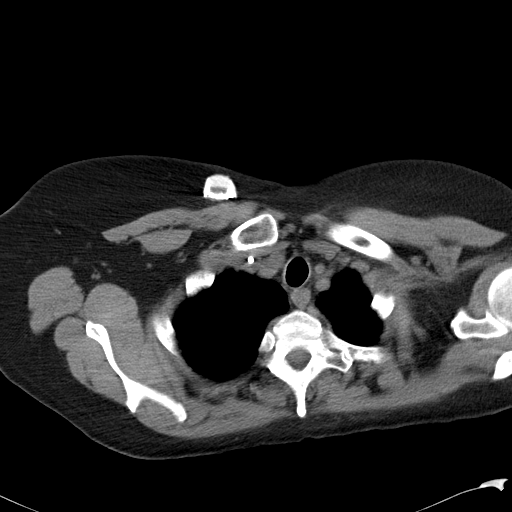

[Series 602: cor · coronal · 1.16mm/px · 3 of 92 slices shown]
[im 31/92  soft-tissue]
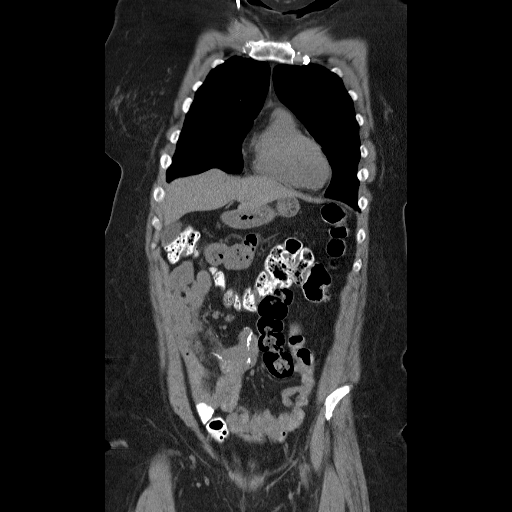
[im 41/92  soft-tissue]
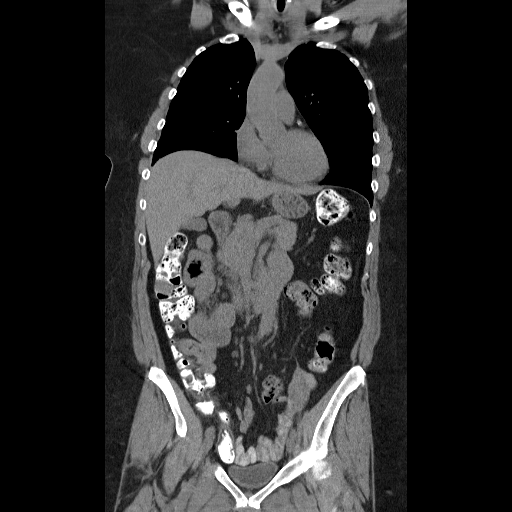
[im 51/92  soft-tissue]
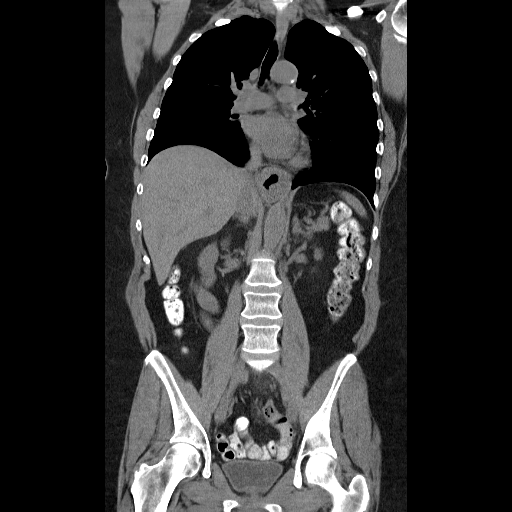

[17 of 46 positions shown; findings below may reference images not displayed]

FINDINGS: CT CHEST FINDINGS

There is a port in the right anterior chest wall. No axillary or
supraclavicular lymphadenopathy. No mediastinal or hilar
lymphadenopathy. No pericardial fluid.

Review of the lung windows demonstrates no pulmonary nodules.

CT ABDOMEN AND PELVIS FINDINGS

Liver, gallbladder, pancreas, spleen, adrenal glands, and kidneys
are normal.

There is a hiatal hernia present. The stomach, small bowel, and
colon are unremarkable. There is a enteric enteric anastomosis in
the mid abdomen. There is a large ventral hernia which contains fat
only.

There is no retroperitoneal or periportal lymphadenopathy. No
mesenteric adenopathy. No peritoneal disease.

No free fluid the pelvis. No evidence of pelvic lymphadenopathy.
Spleen is normal volume. No aggressive osseous lesion.
IMPRESSION: 1. No evidence of lymphadenopathy within chest, abdomen, or pelvis
to suggest lymphoma recurrence.
2. Normal volume spleen.
3. Small bowel anastomosis without complicating features.
4. Moderate hiatal hernia

## 2013-06-28 MED ORDER — SODIUM CHLORIDE 0.9 % IJ SOLN
10.0000 mL | INTRAMUSCULAR | Status: DC | PRN
  Administered 2013-06-28: 10 mL via INTRAVENOUS
  Filled 2013-06-28: qty 10

## 2013-06-28 MED ORDER — HEPARIN SOD (PORK) LOCK FLUSH 100 UNIT/ML IV SOLN
500.0000 [IU] | Freq: Once | INTRAVENOUS | Status: AC
  Administered 2013-06-28: 500 [IU] via INTRAVENOUS
  Filled 2013-06-28: qty 5

## 2013-06-28 NOTE — Telephone Encounter (Signed)
[name] from lab walked in requesting CT on for 01/07 be changed to today 01/06 Pt at lab, pt already drank barium, sw Christine Morales in [place], stated CT not prior authorized by ins even thopugh scheduled back in Sept of last year, eventually was told by Jonelle Sidle to send pt on to CT now shh

## 2013-06-28 NOTE — Patient Instructions (Signed)
Implanted Port Instructions  An implanted port is a central line that has a round shape and is placed under the skin. It is used for long-term IV (intravenous) access for:  · Medicine.  · Fluids.  · Liquid nutrition, such as TPN (total parenteral nutrition).  · Blood samples.  Ports can be placed:  · In the chest area just below the collarbone (this is the most common place.)  · In the arms.  · In the belly (abdomen) area.  · In the legs.  PARTS OF THE PORT  A port has 2 main parts:  · The reservoir. The reservoir is round, disc-shaped, and will be a small, raised area under your skin.  · The reservoir is the part where a needle is inserted (accessed) to either give medicines or to draw blood.  · The catheter. The catheter is a long, slender tube that extends from the reservoir. The catheter is placed into a large vein.  · Medicine that is inserted into the reservoir goes into the catheter and then into the vein.  INSERTION OF THE PORT  · The port is surgically placed in either an operating room or in a procedural area (interventional radiology).  · Medicine may be given to help you relax during the procedure.  · The skin where the port will be inserted is numbed (local anesthetic).  · 1 or 2 small cuts (incisions) will be made in the skin to insert the port.  · The port can be used after it has been inserted.  INCISION SITE CARE  · The incision site may have small adhesive strips on it. This helps keep the incision site closed. Sometimes, no adhesive strips are placed. Instead of adhesive strips, a special kind of surgical glue is used to keep the incision closed.  · If adhesive strips were placed on the incision sites, do not take them off. They will fall off on their own.  · The incision site may be sore for 1 to 2 days. Pain medicine can help.  · Do not get the incision site wet. Bathe or shower as directed by your caregiver.  · The incision site should heal in 5 to 7 days. A small scar may form after the  incision has healed.  ACCESSING THE PORT  Special steps must be taken to access the port:  · Before the port is accessed, a numbing cream can be placed on the skin. This helps numb the skin over the port site.  · A sterile technique is used to access the port.  · The port is accessed with a needle. Only "non-coring" port needles should be used to access the port. Once the port is accessed, a blood return should be checked. This helps ensure the port is in the vein and is not clogged (clotted).  · If your caregiver believes your port should remain accessed, a clear (transparent) bandage will be placed over the needle site. The bandage and needle will need to be changed every week or as directed by your caregiver.  · Keep the bandage covering the needle clean and dry. Do not get it wet. Follow your caregiver's instructions on how to take a shower or bath when the port is accessed.  · If your port does not need to stay accessed, no bandage is needed over the port.  FLUSHING THE PORT  Flushing the port keeps it from getting clogged. How often the port is flushed depends on:  · If a   constant infusion is running. If a constant infusion is running, the port may not need to be flushed.  · If intermittent medicines are given.  · If the port is not being used.  For intermittent medicines:  · The port will need to be flushed:  · After medicines have been given.  · After blood has been drawn.  · As part of routine maintenance.  · A port is normally flushed with:  · Normal saline.  · Heparin.  · Follow your caregiver's advice on how often, how much, and the type of flush to use on your port.  IMPORTANT PORT INFORMATION  · Tell your caregiver if you are allergic to heparin.  · After your port is placed, you will get a manufacturer's information card. The card has information about your port. Keep this card with you at all times.  · There are many types of ports available. Know what kind of port you have.  · In case of an  emergency, it may be helpful to wear a medical alert bracelet. This can help alert health care workers that you have a port.  · The port can stay in for as long as your caregiver believes it is necessary.  · When it is time for the port to come out, surgery will be done to remove it. The surgery will be similar to how the port was put in.  · If you are in the hospital or clinic:  · Your port will be taken care of and flushed by a nurse.  · If you are at home:  · A home health care nurse may give medicines and take care of the port.  · You or a family member can get special training and directions for giving medicine and taking care of the port at home.  SEEK IMMEDIATE MEDICAL CARE IF:   · Your port does not flush or you are unable to get a blood return.  · New drainage or pus is coming from the incision.  · A bad smell is coming from the incision site.  · You develop swelling or increased redness at the incision site.  · You develop increased swelling or pain at the port site.  · You develop swelling or pain in the surrounding skin near the port.  · You have an oral temperature above 102° F (38.9° C), not controlled by medicine.  MAKE SURE YOU:   · Understand these instructions.  · Will watch your condition.  · Will get help right away if you are not doing well or get worse.  Document Released: 06/09/2005 Document Revised: 09/01/2011 Document Reviewed: 08/31/2008  [employer]® Patient Information ©2014 [employer].

## 2013-06-29 ENCOUNTER — Other Ambulatory Visit (HOSPITAL_COMMUNITY): Payer: BC Managed Care – PPO

## 2013-06-29 ENCOUNTER — Ambulatory Visit (HOSPITAL_COMMUNITY): Payer: Medicare Other

## 2013-06-30 ENCOUNTER — Ambulatory Visit (HOSPITAL_BASED_OUTPATIENT_CLINIC_OR_DEPARTMENT_OTHER): Payer: Medicare Other | Admitting: Oncology

## 2013-06-30 ENCOUNTER — Telehealth: Payer: Self-pay | Admitting: Oncology

## 2013-06-30 DIAGNOSIS — C8589 Other specified types of non-Hodgkin lymphoma, extranodal and solid organ sites: Secondary | ICD-10-CM

## 2013-06-30 DIAGNOSIS — C8583 Other specified types of non-Hodgkin lymphoma, intra-abdominal lymph nodes: Secondary | ICD-10-CM

## 2013-06-30 NOTE — Progress Notes (Signed)
Hematology and Oncology Follow Up Visit  Christine Morales [id number] 1945-11-13 68 y.o. 06/30/2013 3:14 PM  Principle Diagnosis: This is a 68 year old female with follicular lymphoma, non-Hodgkin type, stage IIA, presented with a large mesenteric mass in July 2011.  Prior Therapy: The patient received 5 cycles of bendamustine and rituximab, finished in December 2011.  She had a complete response at that time.  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  Current therapy: Observation and surveillance.   Interim History:  Christine Morales presents today for a followup visit.  A very pleasant 68 year old female with diagnosis of follicular lymphoma, treated with bendamustine and rituximab as mentioned. Clinically since the last time I saw her, she had been doing very well.  She did not report any nausea, did not report any vomiting.  Had not reported any specific complaints.  Did not report any fevers, did not report any chills. She report mild fatigue and has retired from work at this time. He has not reported any constitutional symptoms of fevers chills or weight loss. Has not reported any palpable adenopathy or change in her activity level.  Medications: I have reviewed the patient's current medications.  Current Outpatient Prescriptions  Medication Sig Dispense Refill  . amLODipine (NORVASC) 5 MG tablet take 1 tablet by mouth once daily  90 tablet  2  . Cholecalciferol (VITAMIN D3) 1000 UNITS tablet Take 1,000 Units by mouth daily.        . ferrous sulfate (FERRO-BOB) 325 (65 FE) MG tablet Take 325 mg by mouth daily with breakfast.        . losartan (COZAAR) 100 MG tablet Take 1 tablet (100 mg total) by mouth daily.  30 tablet  11   No current facility-administered medications for this visit.   Facility-Administered Medications Ordered in Other Visits  Medication Dose Route  Frequency Provider Last Rate Last Dose  . heparin lock flush 100 unit/mL  500 Units Intravenous Once Wyatt Portela, MD      . sodium chloride 0.9 % injection 10 mL  10 mL Intravenous PRN Wyatt Portela, MD        Allergies: No Known Allergies  Past Medical History, Surgical history, Social history, and Family History were reviewed and updated.  Review of Systems:  Remaining ROS negative. Physical Exam: There were no vitals taken for this visit. ECOG: 1 General appearance: alert Head: Normocephalic, without obvious abnormality, atraumatic Neck: no adenopathy, no carotid bruit, no JVD, supple, symmetrical, trachea midline and thyroid not enlarged, symmetric, no tenderness/mass/nodules Lymph nodes: Cervical, supraclavicular, and axillary nodes normal. Heart:regular rate and rhythm, S1, S2 normal, no murmur, click, rub or gallop Lung:chest clear, no wheezing, rales, normal symmetric air entry Abdomin: soft, non-tender, without masses or organomegaly EXT:no erythema, induration, or nodules   Lab Results: Lab Results  Component Value Date   WBC 4.5 06/28/2013   HGB 11.3* 06/28/2013   HCT 33.2* 06/28/2013   MCV 89.8 06/28/2013   PLT 217 06/28/2013   EXAM:  CT CHEST, ABDOMEN AND PELVIS WITHOUT CONTRAST  TECHNIQUE:  Multidetector CT imaging of the chest, abdomen and pelvis was  performed following the standard protocol without IV contrast.  COMPARISON: CT ABD/PELV WO CM dated 09/09/2012  FINDINGS:  CT CHEST FINDINGS  There is a port in the right anterior chest wall. No axillary or  supraclavicular lymphadenopathy. No mediastinal or hilar  lymphadenopathy. No pericardial fluid.  Review of the lung windows demonstrates no pulmonary nodules.  CT ABDOMEN AND PELVIS FINDINGS  Liver, gallbladder, pancreas, spleen, adrenal glands, and kidneys  are normal.  There is a hiatal hernia present. The stomach, small bowel, and  colon are unremarkable. There is a enteric enteric anastomosis in  the  mid abdomen. There is a large ventral hernia which contains fat  only.  There is no retroperitoneal or periportal lymphadenopathy. No  mesenteric adenopathy. No peritoneal disease.  No free fluid the pelvis. No evidence of pelvic lymphadenopathy.  Spleen is normal volume. No aggressive osseous lesion.  IMPRESSION:  1. No evidence of lymphadenopathy within chest, abdomen, or pelvis  to suggest lymphoma recurrence.  2. Normal volume spleen.  3. Small bowel anastomosis without complicating features.  4. Moderate hiatal hernia   Impression and Plan:  This is a pleasant 68 year old female with the following issues:  1. Follicular lymphoma, stage IIA disease, presenting with a large mesenteric mass, status post systemic chemotherapy completed in December 2011.  She is currently on active surveillance. CT scan from 06/28/2013 was reviewed today and did not show any evidence of relapse. The plan is to continue follow up with a clinical visit in 4 months and repeat imaging in 8 months to 2. Port-A-Cath management.  We will flush her Port-A-Cath every 8 weeks.   3.     Mild Anemia: likely renal insufficiency related and stable.    Zola Button, MD 1/8/20153:14 PM

## 2013-06-30 NOTE — Telephone Encounter (Signed)
Gave pt appt for flush on March  and MD visit with labs on May 2015

## 2013-07-01 ENCOUNTER — Ambulatory Visit: Payer: BC Managed Care – PPO | Admitting: Oncology

## 2013-07-01 ENCOUNTER — Encounter: Payer: Self-pay | Admitting: Oncology

## 2013-08-17 ENCOUNTER — Other Ambulatory Visit: Payer: Self-pay | Admitting: Internal Medicine

## 2013-08-29 ENCOUNTER — Ambulatory Visit (HOSPITAL_BASED_OUTPATIENT_CLINIC_OR_DEPARTMENT_OTHER): Payer: Medicare Other

## 2013-08-29 VITALS — BP 120/59 | HR 73 | Temp 97.5°F

## 2013-08-29 DIAGNOSIS — Z452 Encounter for adjustment and management of vascular access device: Secondary | ICD-10-CM

## 2013-08-29 DIAGNOSIS — Z95828 Presence of other vascular implants and grafts: Secondary | ICD-10-CM

## 2013-08-29 DIAGNOSIS — C8589 Other specified types of non-Hodgkin lymphoma, extranodal and solid organ sites: Secondary | ICD-10-CM

## 2013-08-29 MED ORDER — HEPARIN SOD (PORK) LOCK FLUSH 100 UNIT/ML IV SOLN
500.0000 [IU] | Freq: Once | INTRAVENOUS | Status: AC
  Administered 2013-08-29: 500 [IU] via INTRAVENOUS
  Filled 2013-08-29: qty 5

## 2013-08-29 MED ORDER — SODIUM CHLORIDE 0.9 % IJ SOLN
10.0000 mL | INTRAMUSCULAR | Status: DC | PRN
  Administered 2013-08-29: 10 mL via INTRAVENOUS
  Filled 2013-08-29: qty 10

## 2013-09-12 ENCOUNTER — Other Ambulatory Visit (INDEPENDENT_AMBULATORY_CARE_PROVIDER_SITE_OTHER): Payer: Medicare Other

## 2013-09-12 DIAGNOSIS — I10 Essential (primary) hypertension: Secondary | ICD-10-CM

## 2013-09-12 DIAGNOSIS — E538 Deficiency of other specified B group vitamins: Secondary | ICD-10-CM

## 2013-09-12 DIAGNOSIS — N189 Chronic kidney disease, unspecified: Secondary | ICD-10-CM

## 2013-09-12 DIAGNOSIS — J309 Allergic rhinitis, unspecified: Secondary | ICD-10-CM

## 2013-09-12 DIAGNOSIS — D509 Iron deficiency anemia, unspecified: Secondary | ICD-10-CM

## 2013-09-12 DIAGNOSIS — C8583 Other specified types of non-Hodgkin lymphoma, intra-abdominal lymph nodes: Secondary | ICD-10-CM

## 2013-09-12 DIAGNOSIS — E559 Vitamin D deficiency, unspecified: Secondary | ICD-10-CM

## 2013-09-12 DIAGNOSIS — Z0000 Encounter for general adult medical examination without abnormal findings: Secondary | ICD-10-CM

## 2013-09-12 DIAGNOSIS — R634 Abnormal weight loss: Secondary | ICD-10-CM

## 2013-09-12 LAB — HEPATIC FUNCTION PANEL
ALBUMIN: 4.1 g/dL (ref 3.5–5.2)
ALT: 14 U/L (ref 0–35)
AST: 19 U/L (ref 0–37)
Alkaline Phosphatase: 64 U/L (ref 39–117)
Bilirubin, Direct: 0.1 mg/dL (ref 0.0–0.3)
Total Bilirubin: 0.6 mg/dL (ref 0.3–1.2)
Total Protein: 6.9 g/dL (ref 6.0–8.3)

## 2013-09-12 LAB — CBC WITH DIFFERENTIAL/PLATELET
BASOS ABS: 0 10*3/uL (ref 0.0–0.1)
Basophils Relative: 0.4 % (ref 0.0–3.0)
Eosinophils Absolute: 0.2 10*3/uL (ref 0.0–0.7)
Eosinophils Relative: 4.2 % (ref 0.0–5.0)
HCT: 35.4 % — ABNORMAL LOW (ref 36.0–46.0)
Hemoglobin: 11.6 g/dL — ABNORMAL LOW (ref 12.0–15.0)
LYMPHS PCT: 41.4 % (ref 12.0–46.0)
Lymphs Abs: 1.9 10*3/uL (ref 0.7–4.0)
MCHC: 32.8 g/dL (ref 30.0–36.0)
MCV: 89.5 fl (ref 78.0–100.0)
MONOS PCT: 9.7 % (ref 3.0–12.0)
Monocytes Absolute: 0.4 10*3/uL (ref 0.1–1.0)
NEUTROS PCT: 44.3 % (ref 43.0–77.0)
Neutro Abs: 2 10*3/uL (ref 1.4–7.7)
PLATELETS: 247 10*3/uL (ref 150.0–400.0)
RBC: 3.95 Mil/uL (ref 3.87–5.11)
RDW: 14.9 % — ABNORMAL HIGH (ref 11.5–14.6)
WBC: 4.6 10*3/uL (ref 4.5–10.5)

## 2013-09-12 LAB — URINALYSIS, ROUTINE W REFLEX MICROSCOPIC
Bilirubin Urine: NEGATIVE
KETONES UR: NEGATIVE
Nitrite: NEGATIVE
Specific Gravity, Urine: 1.015 (ref 1.000–1.030)
TOTAL PROTEIN, URINE-UPE24: NEGATIVE
URINE GLUCOSE: NEGATIVE
UROBILINOGEN UA: 0.2 (ref 0.0–1.0)
pH: 5.5 (ref 5.0–8.0)

## 2013-09-12 LAB — BASIC METABOLIC PANEL
BUN: 28 mg/dL — AB (ref 6–23)
CALCIUM: 9.2 mg/dL (ref 8.4–10.5)
CO2: 27 mEq/L (ref 19–32)
Chloride: 108 mEq/L (ref 96–112)
Creatinine, Ser: 2.3 mg/dL — ABNORMAL HIGH (ref 0.4–1.2)
GFR: 27.88 mL/min — AB (ref >60.00)
Glucose, Bld: 85 mg/dL (ref 70–99)
Potassium: 4.5 mEq/L (ref 3.5–5.1)
Sodium: 142 mEq/L (ref 135–145)

## 2013-09-12 LAB — LIPID PANEL
CHOL/HDL RATIO: 4
Cholesterol: 223 mg/dL — ABNORMAL HIGH (ref 0–200)
HDL: 54.2 mg/dL (ref >39.00)
LDL Cholesterol: 145 mg/dL — ABNORMAL HIGH (ref 0–99)
TRIGLYCERIDES: 121 mg/dL (ref 0.0–149.0)
VLDL: 24.2 mg/dL (ref 0.0–40.0)

## 2013-09-12 LAB — VITAMIN B12: Vitamin B-12: 372 pg/mL (ref 211–911)

## 2013-09-12 LAB — TSH: TSH: 87.29 u[IU]/mL — ABNORMAL HIGH (ref 0.35–5.50)

## 2013-09-13 ENCOUNTER — Ambulatory Visit (INDEPENDENT_AMBULATORY_CARE_PROVIDER_SITE_OTHER): Payer: Medicare Other | Admitting: Internal Medicine

## 2013-09-13 ENCOUNTER — Encounter: Payer: Self-pay | Admitting: Internal Medicine

## 2013-09-13 VITALS — BP 110/66 | HR 72 | Temp 97.4°F | Resp 16 | Ht 66.0 in | Wt 218.0 lb

## 2013-09-13 DIAGNOSIS — N189 Chronic kidney disease, unspecified: Secondary | ICD-10-CM

## 2013-09-13 DIAGNOSIS — D509 Iron deficiency anemia, unspecified: Secondary | ICD-10-CM

## 2013-09-13 DIAGNOSIS — Z0000 Encounter for general adult medical examination without abnormal findings: Secondary | ICD-10-CM

## 2013-09-13 DIAGNOSIS — I10 Essential (primary) hypertension: Secondary | ICD-10-CM

## 2013-09-13 DIAGNOSIS — E538 Deficiency of other specified B group vitamins: Secondary | ICD-10-CM

## 2013-09-13 DIAGNOSIS — E039 Hypothyroidism, unspecified: Secondary | ICD-10-CM | POA: Insufficient documentation

## 2013-09-13 DIAGNOSIS — R634 Abnormal weight loss: Secondary | ICD-10-CM

## 2013-09-13 LAB — VITAMIN D 25 HYDROXY (VIT D DEFICIENCY, FRACTURES): VIT D 25 HYDROXY: 41 ng/mL (ref 30–89)

## 2013-09-13 MED ORDER — LEVOTHYROXINE SODIUM 100 MCG PO TABS: 100.0000 ug | ORAL_TABLET | Freq: Every day | ORAL | Status: DC

## 2013-09-13 MED ORDER — CIPROFLOXACIN HCL 250 MG PO TABS: 250.0000 mg | ORAL_TABLET | Freq: Two times a day (BID) | ORAL | Status: DC

## 2013-09-13 NOTE — Progress Notes (Signed)
Pre visit review using our clinic review tool, if applicable. No additional management support is needed unless otherwise documented below in the visit note. 

## 2013-09-13 NOTE — Assessment & Plan Note (Signed)
Continue with current prescription therapy as reflected on the Med list.  

## 2013-09-13 NOTE — Assessment & Plan Note (Signed)
Labs

## 2013-09-13 NOTE — Assessment & Plan Note (Signed)
Labs  Continue with current prescription therapy as reflected on the Med list.  

## 2013-09-13 NOTE — Progress Notes (Signed)
   Subjective:    HPI The patient is here for a wellness exam. The patient has been doing well overall without major physical or psychological issues going on lately.  The patient presents for a follow-up of  chronic hypertension, anemia, non-Hodgkin's lymphoma, vit B12 def controlled with medicines F/u on wt loss - stable   Wt Readings from Last 3 Encounters:  09/13/13 218 lb (98.884 kg)  05/17/13 216 lb (97.977 kg)  03/02/13 220 lb (99.791 kg)   BP Readings from Last 3 Encounters:  09/13/13 110/66  08/29/13 120/59  06/28/13 137/85      Review of Systems  Constitutional: Negative for chills, activity change, appetite change, fatigue and unexpected weight change.  HENT: Negative for congestion, mouth sores and sinus pressure.   Eyes: Negative for visual disturbance.  Respiratory: Negative for cough and chest tightness.   Gastrointestinal: Negative for nausea and abdominal pain.  Genitourinary: Negative for frequency, difficulty urinating and vaginal pain.  Musculoskeletal: Negative for back pain and gait problem.  Skin: Negative for pallor and rash.  Neurological: Negative for dizziness, tremors, weakness, numbness and headaches.  Psychiatric/Behavioral: Negative for confusion and sleep disturbance.       Objective:   Physical Exam  Constitutional: She appears well-developed and well-nourished. No distress.  HENT:  Head: Normocephalic.  Right Ear: External ear normal.  Left Ear: External ear normal.  Nose: Nose normal.  Mouth/Throat: Oropharynx is clear and moist.  Eyes: Conjunctivae are normal. Pupils are equal, round, and reactive to light. Right eye exhibits no discharge. Left eye exhibits no discharge.  Neck: Normal range of motion. Neck supple. No JVD present. No tracheal deviation present. No thyromegaly present.  Cardiovascular: Normal rate, regular rhythm and normal heart sounds.   Pulmonary/Chest: No stridor. No respiratory distress. She has no wheezes.   Abdominal: Soft. Bowel sounds are normal. She exhibits no distension and no mass. There is no tenderness. There is no rebound and no guarding.  Musculoskeletal: She exhibits no edema and no tenderness.  Lymphadenopathy:    She has no cervical adenopathy.  Neurological: She displays normal reflexes. No cranial nerve deficit. She exhibits normal muscle tone. Coordination normal.  Skin: No rash noted. No erythema.  Psychiatric: She has a normal mood and affect. Her behavior is normal. Judgment and thought content normal.     Lab Results  Component Value Date   WBC 4.6 09/12/2013   HGB 11.6* 09/12/2013   HCT 35.4* 09/12/2013   PLT 247.0 09/12/2013   CHOL 223* 09/12/2013   TRIG 121.0 09/12/2013   HDL 54.20 09/12/2013   ALT 14 09/12/2013   AST 19 09/12/2013   NA 142 09/12/2013   K 4.5 09/12/2013   CL 108 09/12/2013   CREATININE 2.3* 09/12/2013   BUN 28* 09/12/2013   CO2 27 09/12/2013   TSH 87.29* 09/12/2013   INR 1.08 01/27/2010        Assessment & Plan:

## 2013-09-13 NOTE — Assessment & Plan Note (Signed)
labs

## 2013-09-13 NOTE — Assessment & Plan Note (Signed)
New 3/15 Labs Levothyroxine 100 mcg/d

## 2013-09-13 NOTE — Assessment & Plan Note (Signed)
We discussed age appropriate health related issues, including available/recomended screening tests and vaccinations. We discussed a need for adhering to healthy diet and exercise. Labs/EKG were reviewed/ordered. All questions were answered.  She declined all shots.Marland KitchenMarland Kitchen

## 2013-09-13 NOTE — Assessment & Plan Note (Signed)
Wt Readings from Last 3 Encounters:  09/13/13 218 lb (98.884 kg)  05/17/13 216 lb (97.977 kg)  03/02/13 220 lb (99.791 kg)  resolved

## 2013-09-14 ENCOUNTER — Telehealth: Payer: Self-pay | Admitting: Internal Medicine

## 2013-09-14 ENCOUNTER — Telehealth: Payer: Self-pay | Admitting: Oncology

## 2013-09-14 NOTE — Telephone Encounter (Signed)
cld pt to adv of new appt time & date per Dr Creola Corn answer no machine to leave message-mailed copy of new appt & time

## 2013-09-14 NOTE — Telephone Encounter (Signed)
Relevant patient education mailed to patient.  

## 2013-09-26 ENCOUNTER — Telehealth: Payer: Self-pay | Admitting: Oncology

## 2013-09-26 NOTE — Telephone Encounter (Signed)
pt called to r/s 5/15 appts to 5/19. pt has new d/t.

## 2013-10-28 ENCOUNTER — Ambulatory Visit: Payer: Medicare Other | Admitting: Oncology

## 2013-10-28 ENCOUNTER — Other Ambulatory Visit: Payer: Medicare Other

## 2013-11-04 ENCOUNTER — Ambulatory Visit: Payer: Medicare Other | Admitting: Oncology

## 2013-11-04 ENCOUNTER — Other Ambulatory Visit: Payer: Medicare Other

## 2013-11-07 ENCOUNTER — Other Ambulatory Visit: Payer: Self-pay | Admitting: Medical Oncology

## 2013-11-07 MED ORDER — LIDOCAINE-PRILOCAINE 2.5-2.5 % EX CREA: 1.0000 "application " | TOPICAL_CREAM | CUTANEOUS | Status: DC | PRN

## 2013-11-07 NOTE — Telephone Encounter (Signed)
Patient requesting prescription for Emla cream for port-a-cath site. Per pt request, prescription escribed to her listed pharmacy. Patient informed prescription sent. Expresses thanks, no further questions at this time.

## 2013-11-08 ENCOUNTER — Encounter: Payer: Self-pay | Admitting: Oncology

## 2013-11-08 ENCOUNTER — Ambulatory Visit (HOSPITAL_BASED_OUTPATIENT_CLINIC_OR_DEPARTMENT_OTHER): Payer: Medicare Other | Admitting: Oncology

## 2013-11-08 ENCOUNTER — Other Ambulatory Visit (HOSPITAL_BASED_OUTPATIENT_CLINIC_OR_DEPARTMENT_OTHER): Payer: Medicare Other

## 2013-11-08 ENCOUNTER — Ambulatory Visit: Payer: Medicare Other

## 2013-11-08 ENCOUNTER — Telehealth: Payer: Self-pay | Admitting: Oncology

## 2013-11-08 VITALS — BP 123/74 | HR 75 | Temp 98.1°F | Resp 20 | Ht 66.0 in | Wt 220.2 lb

## 2013-11-08 DIAGNOSIS — D649 Anemia, unspecified: Secondary | ICD-10-CM

## 2013-11-08 DIAGNOSIS — C8583 Other specified types of non-Hodgkin lymphoma, intra-abdominal lymph nodes: Secondary | ICD-10-CM

## 2013-11-08 DIAGNOSIS — C8299 Follicular lymphoma, unspecified, extranodal and solid organ sites: Secondary | ICD-10-CM

## 2013-11-08 DIAGNOSIS — E039 Hypothyroidism, unspecified: Secondary | ICD-10-CM

## 2013-11-08 DIAGNOSIS — Z95828 Presence of other vascular implants and grafts: Secondary | ICD-10-CM

## 2013-11-08 LAB — COMPREHENSIVE METABOLIC PANEL (CC13)
ALBUMIN: 3.7 g/dL (ref 3.5–5.0)
ALT: 11 U/L (ref 0–55)
AST: 18 U/L (ref 5–34)
Alkaline Phosphatase: 65 U/L (ref 40–150)
Anion Gap: 11 mEq/L (ref 3–11)
BUN: 24.7 mg/dL (ref 7.0–26.0)
CALCIUM: 9.5 mg/dL (ref 8.4–10.4)
CO2: 22 mEq/L (ref 22–29)
Chloride: 110 mEq/L — ABNORMAL HIGH (ref 98–109)
Creatinine: 2.2 mg/dL — ABNORMAL HIGH (ref 0.6–1.1)
Glucose: 96 mg/dl (ref 70–140)
Potassium: 4.3 mEq/L (ref 3.5–5.1)
Sodium: 143 mEq/L (ref 136–145)
TOTAL PROTEIN: 6.6 g/dL (ref 6.4–8.3)
Total Bilirubin: 0.46 mg/dL (ref 0.20–1.20)

## 2013-11-08 LAB — CBC WITH DIFFERENTIAL/PLATELET
BASO%: 0.5 % (ref 0.0–2.0)
Basophils Absolute: 0 10*3/uL (ref 0.0–0.1)
EOS%: 1.9 % (ref 0.0–7.0)
Eosinophils Absolute: 0.1 10*3/uL (ref 0.0–0.5)
HCT: 33.7 % — ABNORMAL LOW (ref 34.8–46.6)
HEMOGLOBIN: 11.1 g/dL — AB (ref 11.6–15.9)
LYMPH#: 1.7 10*3/uL (ref 0.9–3.3)
LYMPH%: 33.4 % (ref 14.0–49.7)
MCH: 29.5 pg (ref 25.1–34.0)
MCHC: 33 g/dL (ref 31.5–36.0)
MCV: 89.5 fL (ref 79.5–101.0)
MONO#: 0.4 10*3/uL (ref 0.1–0.9)
MONO%: 7 % (ref 0.0–14.0)
NEUT#: 3 10*3/uL (ref 1.5–6.5)
NEUT%: 57.2 % (ref 38.4–76.8)
Platelets: 241 10*3/uL (ref 145–400)
RBC: 3.77 10*6/uL (ref 3.70–5.45)
RDW: 15.1 % — AB (ref 11.2–14.5)
WBC: 5.2 10*3/uL (ref 3.9–10.3)

## 2013-11-08 MED ORDER — HEPARIN SOD (PORK) LOCK FLUSH 100 UNIT/ML IV SOLN
500.0000 [IU] | Freq: Once | INTRAVENOUS | Status: AC
  Administered 2013-11-08: 500 [IU] via INTRAVENOUS
  Filled 2013-11-08: qty 5

## 2013-11-08 MED ORDER — SODIUM CHLORIDE 0.9 % IJ SOLN
10.0000 mL | INTRAMUSCULAR | Status: DC | PRN
  Administered 2013-11-08: 10 mL via INTRAVENOUS
  Filled 2013-11-08: qty 10

## 2013-11-08 NOTE — Telephone Encounter (Signed)
gv adn printed aptp sched and avs for pt for SEpt...pt did not want barium at this time

## 2013-11-08 NOTE — Patient Instructions (Signed)

## 2013-11-08 NOTE — Progress Notes (Signed)
Hematology and Oncology Follow Up Visit  PENELOPI Morales [id number] 30-Mar-1946 68 y.o. 11/08/2013 12:17 PM  Principle Diagnosis: This is a 68 year old female with follicular lymphoma, non-Hodgkin type, stage IIA, presented with a large mesenteric mass in July 2011.  Prior Therapy: The patient received 5 cycles of bendamustine and rituximab, finished in December 2011.  She had a complete response at that time.  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  Current therapy: Observation and surveillance.   Interim History:  Christine Morales presents today for a followup visit. Since the last time, she had been doing very well with very few complaints. She did report some occasional fatigue and tiredness and her thyroid medicine is currently being adjusted. She did not report any nausea, did not report any vomiting. She did not report any fevers, did not report any chills. He has not reported any constitutional symptoms of fevers chills or weight loss. Has not reported any palpable adenopathy or change in her activity level. She has not reported any headaches or blurry vision or double vision. Has not reported any lymphadenopathy or petechiae. Has not reported any chest pain or shortness of breath. Has not reported any cough or hemoptysis. Is not reporting any change in her bowel habits. Her rest of review of systems was unremarkable.  Medications: I have reviewed the patient's current medications. No change by my review. Current Outpatient Prescriptions  Medication Sig Dispense Refill  . amLODipine (NORVASC) 5 MG tablet take 1 tablet by mouth once daily  90 tablet  2  . Cholecalciferol (VITAMIN D3) 1000 UNITS tablet Take 1,000 Units by mouth daily.        . ferrous sulfate (FERRO-BOB) 325 (65 FE) MG tablet Take 325 mg by mouth daily with breakfast.        . levothyroxine (SYNTHROID,  LEVOTHROID) 100 MCG tablet Take 1 tablet (100 mcg total) by mouth daily.  30 tablet  11  . lidocaine-prilocaine (EMLA) cream Apply 1 application topically as needed. Apply to port-a-cath site 1 to 2 hours prior to appt.  30 g  1  . losartan (COZAAR) 100 MG tablet take 1 tablet by mouth once daily  30 tablet  5   No current facility-administered medications for this visit.   Facility-Administered Medications Ordered in Other Visits  Medication Dose Route Frequency Provider Last Rate Last Dose  . heparin lock flush 100 unit/mL  500 Units Intravenous Once Wyatt Portela, MD      . sodium chloride 0.9 % injection 10 mL  10 mL Intravenous PRN Wyatt Portela, MD        Allergies: No Known Allergies  Physical Exam: Blood pressure 123/74, pulse 75, temperature 98.1 F (36.7 C), temperature source Oral, resp. rate 20, height 5\' 6"  (1.676 m), weight 220 lb 3.2 oz (99.882 kg), SpO2 100.00%. ECOG: 1 General appearance: alert not in any distress. Head: Normocephalic, without obvious abnormality, atraumatic Neck: no adenopathy, no thyroid masses. Lymph nodes: Cervical, supraclavicular, and axillary nodes normal. Heart:regular rate and rhythm, S1, S2 normal, no murmur, click, rub or gallop Lung:chest clear, no wheezing, rales, normal symmetric air entry Abdomin: soft, non-tender, without masses or organomegaly EXT:no erythema, induration, or nodules   Lab Results: Lab Results  Component Value Date   WBC 5.2 11/08/2013   HGB 11.1* 11/08/2013   HCT 33.7* 11/08/2013   MCV 89.5 11/08/2013  PLT 241 11/08/2013    Impression and Plan:  This is a pleasant 68 year old female with the following issues:  1. Follicular lymphoma, stage IIA disease, presenting with a large mesenteric mass, status post systemic chemotherapy completed in December 2011.  She is currently on active surveillance. CT scan from 06/28/2013  did not show any evidence of relapse. The plan is to continue follow up with a clinical visit  in 4 months and repeat imaging in September of 2015. 2. Port-A-Cath management.  We will flush her Port-A-Cath every 8 weeks.   3.     Mild Anemia: likely renal insufficiency related and stable. 4.     Hypothyroidism: Followed by her primary care physician.     Wyatt Portela, MD 5/19/201512:17 PM

## 2013-11-22 ENCOUNTER — Other Ambulatory Visit: Payer: Self-pay | Admitting: Internal Medicine

## 2013-12-03 ENCOUNTER — Other Ambulatory Visit: Payer: Self-pay | Admitting: Internal Medicine

## 2013-12-07 ENCOUNTER — Encounter: Payer: Self-pay | Admitting: Women's Health

## 2013-12-13 ENCOUNTER — Ambulatory Visit: Payer: Medicare Other | Admitting: Internal Medicine

## 2013-12-14 ENCOUNTER — Ambulatory Visit: Payer: Self-pay | Admitting: Gynecology

## 2014-01-06 ENCOUNTER — Ambulatory Visit (INDEPENDENT_AMBULATORY_CARE_PROVIDER_SITE_OTHER): Payer: Medicare Other | Admitting: Gynecology

## 2014-01-06 ENCOUNTER — Encounter: Payer: Self-pay | Admitting: Gynecology

## 2014-01-06 ENCOUNTER — Other Ambulatory Visit (HOSPITAL_COMMUNITY)
Admission: RE | Admit: 2014-01-06 | Discharge: 2014-01-06 | Disposition: A | Discharge status: Home / Self Care | Payer: Medicare Other | Source: Ambulatory Visit | Attending: Gynecology | Admitting: Gynecology

## 2014-01-06 VITALS — BP 112/70 | Ht 66.0 in | Wt 220.0 lb

## 2014-01-06 DIAGNOSIS — N952 Postmenopausal atrophic vaginitis: Secondary | ICD-10-CM

## 2014-01-06 DIAGNOSIS — Z1151 Encounter for screening for human papillomavirus (HPV): Secondary | ICD-10-CM | POA: Insufficient documentation

## 2014-01-06 DIAGNOSIS — Z01419 Encounter for gynecological examination (general) (routine) without abnormal findings: Secondary | ICD-10-CM | POA: Insufficient documentation

## 2014-01-06 DIAGNOSIS — M899 Disorder of bone, unspecified: Secondary | ICD-10-CM

## 2014-01-06 DIAGNOSIS — Z780 Asymptomatic menopausal state: Secondary | ICD-10-CM

## 2014-01-06 DIAGNOSIS — M8580 Other specified disorders of bone density and structure, unspecified site: Secondary | ICD-10-CM

## 2014-01-06 DIAGNOSIS — N951 Menopausal and female climacteric states: Secondary | ICD-10-CM

## 2014-01-06 DIAGNOSIS — M949 Disorder of cartilage, unspecified: Secondary | ICD-10-CM

## 2014-01-06 DIAGNOSIS — Z1272 Encounter for screening for malignant neoplasm of vagina: Secondary | ICD-10-CM

## 2014-01-06 NOTE — Progress Notes (Signed)
Christine Morales 04-Sep-1945 [id number]   History:    68 y.o.  for GYN exam and followup. Patient has not been seen in office is 2011. Patient since that time has been diagnosed with follicular lymphoma, non-Hodgkin type, stage IIA with large mesenteric mass reported in July 2011. Patient had completed her chemotherapy back in 2011 and is currently being followed by hematologist oncologist Dr. Alen Blew. Review of her right indicated in 1982 she had a total abdominal hysterectomy with ovarian conservation secondary to symptomatic leiomyomatous uteri. Patient denies any past history of abnormal Pap smears. Her colonoscopy according to her was less than 10 years ago was normal. Her last bone density study was in 2008 with the lowest T. score at the [facility] with a value of -1.1. Her last mammogram was in 2014 normal. Her PCP has been doing her blood work as well as Dr. Alain Marion. She was otherwise asymptomatic today.   Patient currently has her Port-A-Cath still present  Past medical history,surgical history, family history and social history were all reviewed and documented in the EPIC chart.  Gynecologic History No LMP recorded. Patient has had a hysterectomy. Contraception: status post hysterectomy Last Pap: 2011. Results were: normal Last mammogram: 2014. Results were: normal  Obstetric History OB History  Gravida Para Term Preterm AB SAB TAB Ectopic Multiple Living  2 2        2     # Outcome Date GA Lbr Len/2nd Weight Sex Delivery Anes PTL Lv  2 PAR           1 PAR                ROS: A ROS was performed and pertinent positives and negatives are included in the history.  GENERAL: No fevers or chills. HEENT: No change in vision, no earache, sore throat or sinus congestion. NECK: No pain or stiffness. CARDIOVASCULAR: No chest pain or pressure. No palpitations. PULMONARY: No shortness of breath, cough or wheeze. GASTROINTESTINAL: No abdominal pain, nausea, vomiting or diarrhea, melena or  bright red blood per rectum. GENITOURINARY: No urinary frequency, urgency, hesitancy or dysuria. MUSCULOSKELETAL: No joint or muscle pain, no back pain, no recent trauma. DERMATOLOGIC: No rash, no itching, no lesions. ENDOCRINE: No polyuria, polydipsia, no heat or cold intolerance. No recent change in weight. HEMATOLOGICAL: No anemia or easy bruising or bleeding. NEUROLOGIC: No headache, seizures, numbness, tingling or weakness. PSYCHIATRIC: No depression, no loss of interest in normal activity or change in sleep pattern.     Exam: chaperone present  BP 112/70  Ht 5\' 6"  (1.676 m)  Wt 220 lb (99.791 kg)  BMI 35.53 kg/m2  Body mass index is 35.53 kg/(m^2).  General appearance : Well developed well nourished female. No acute distress HEENT: Neck supple, trachea midline, no carotid bruits, no thyroidmegaly Lungs: Clear to auscultation, no rhonchi or wheezes, or rib retractions  Heart: Regular rate and rhythm, no murmurs or gallops Breast:Examined in sitting and supine position were symmetrical in appearance, no palpable masses or tenderness,  no skin retraction, no nipple inversion, no nipple discharge, no skin discoloration, no axillary or supraclavicular lymphadenopathy Abdomen: no palpable masses or tenderness, no rebound or guarding Extremities: no edema or skin discoloration or tenderness  Pelvic:  Bartholin, Urethra, Skene Glands: Within normal limits             Vagina: No gross lesions or discharge, Atrophic changes  Cervix:Absent   Uterus absent   Adnexa  Without masses or tenderness  Anus and perineum  normal   Rectovaginal  normal sphincter tone without palpated masses or tenderness             Hemoccult PCP provides     Assessment/Plan:  68 y.o. female  overdue annual GYN exam. Prior history of TAH secondary to symptomatic leiomyomatous uteri. Patient many years ago was on HRT currently on none. Patient was asymptomatic today. Patient with history of follicular lymphoma,  non-Hodgkin type, stage IIA currently being followed by oncologist. Patient overdue for bone density study which will be scheduled here in the office in the next few weeks. She was reminded to schedule her mammogram and also the importance of monthly breast exams. We discussed importance of calcium and vitamin D in regular exercise for osteoporosis prevention. Pap smear was done today and she will no longer needed according to the new guidelines.    Note: This dictation was prepared with  Dragon/digital dictation along withSmart phrase technology. Any transcriptional errors that result from this process are unintentional.   Terrance Mass MD, 12:19 PM 01/06/2014

## 2014-01-06 NOTE — Patient Instructions (Signed)

## 2014-01-09 ENCOUNTER — Encounter: Payer: Self-pay | Admitting: Gynecology

## 2014-01-10 LAB — CYTOLOGY - PAP

## 2014-01-12 ENCOUNTER — Encounter: Payer: Self-pay | Admitting: Gynecology

## 2014-03-04 ENCOUNTER — Encounter: Payer: Self-pay | Admitting: Internal Medicine

## 2014-03-07 ENCOUNTER — Ambulatory Visit: Payer: Medicare Other

## 2014-03-07 ENCOUNTER — Other Ambulatory Visit (HOSPITAL_BASED_OUTPATIENT_CLINIC_OR_DEPARTMENT_OTHER): Payer: Medicare Other

## 2014-03-07 ENCOUNTER — Ambulatory Visit (HOSPITAL_COMMUNITY)
Admission: RE | Admit: 2014-03-07 | Discharge: 2014-03-07 | Disposition: A | Discharge status: Home / Self Care | Payer: Medicare Other | Source: Ambulatory Visit | Attending: Oncology | Admitting: Oncology

## 2014-03-07 VITALS — BP 112/68 | HR 67 | Temp 97.5°F

## 2014-03-07 DIAGNOSIS — C8583 Other specified types of non-Hodgkin lymphoma, intra-abdominal lymph nodes: Secondary | ICD-10-CM | POA: Insufficient documentation

## 2014-03-07 DIAGNOSIS — C8299 Follicular lymphoma, unspecified, extranodal and solid organ sites: Secondary | ICD-10-CM

## 2014-03-07 DIAGNOSIS — Z95828 Presence of other vascular implants and grafts: Secondary | ICD-10-CM

## 2014-03-07 LAB — CBC WITH DIFFERENTIAL/PLATELET
BASO%: 0.6 % (ref 0.0–2.0)
Basophils Absolute: 0 10*3/uL (ref 0.0–0.1)
EOS ABS: 0.2 10*3/uL (ref 0.0–0.5)
EOS%: 3.5 % (ref 0.0–7.0)
HEMATOCRIT: 35.9 % (ref 34.8–46.6)
HGB: 11.7 g/dL (ref 11.6–15.9)
LYMPH%: 34.7 % (ref 14.0–49.7)
MCH: 29.3 pg (ref 25.1–34.0)
MCHC: 32.5 g/dL (ref 31.5–36.0)
MCV: 90.2 fL (ref 79.5–101.0)
MONO#: 0.4 10*3/uL (ref 0.1–0.9)
MONO%: 8 % (ref 0.0–14.0)
NEUT%: 53.2 % (ref 38.4–76.8)
NEUTROS ABS: 2.4 10*3/uL (ref 1.5–6.5)
Platelets: 240 10*3/uL (ref 145–400)
RBC: 3.98 10*6/uL (ref 3.70–5.45)
RDW: 14.3 % (ref 11.2–14.5)
WBC: 4.6 10*3/uL (ref 3.9–10.3)
lymph#: 1.6 10*3/uL (ref 0.9–3.3)

## 2014-03-07 LAB — COMPREHENSIVE METABOLIC PANEL (CC13)
ALK PHOS: 75 U/L (ref 40–150)
ALT: 10 U/L (ref 0–55)
ANION GAP: 12 meq/L — AB (ref 3–11)
AST: 17 U/L (ref 5–34)
Albumin: 3.7 g/dL (ref 3.5–5.0)
BILIRUBIN TOTAL: 0.46 mg/dL (ref 0.20–1.20)
BUN: 23.2 mg/dL (ref 7.0–26.0)
CO2: 23 meq/L (ref 22–29)
CREATININE: 2.3 mg/dL — AB (ref 0.6–1.1)
Calcium: 9.3 mg/dL (ref 8.4–10.4)
Chloride: 107 mEq/L (ref 98–109)
GLUCOSE: 95 mg/dL (ref 70–140)
Potassium: 4.4 mEq/L (ref 3.5–5.1)
Sodium: 143 mEq/L (ref 136–145)
Total Protein: 7 g/dL (ref 6.4–8.3)

## 2014-03-07 IMAGING — CT CT ABD-PELV W/O CM
2 of 4 series · 16 of 46 positions shown, 18 images · non-contrast
Comparison: None.

CLINICAL DATA: Lymphoma follow-up

EXAM:
CT CHEST, ABDOMEN AND PELVIS WITHOUT CONTRAST
TECHNIQUE: Multidetector CT imaging of the chest, abdomen and pelvis was
performed following the standard protocol without IV contrast.

[Series 2: cap w/o w/o st · axial · non-contrast · 0.68mm/px · z∈[-746,-206]mm · 13 of 122 slices shown, 15 images]
[im 7/122  soft-tissue]
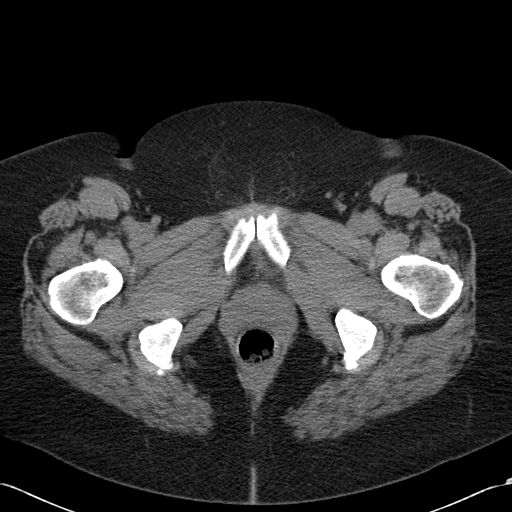
[im 7/122  bone]
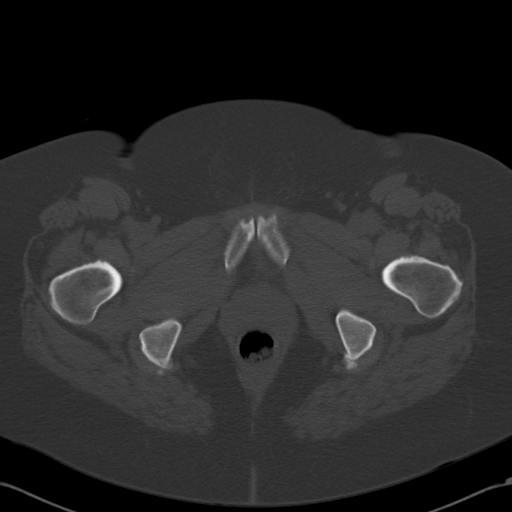
[im 19/122  soft-tissue]
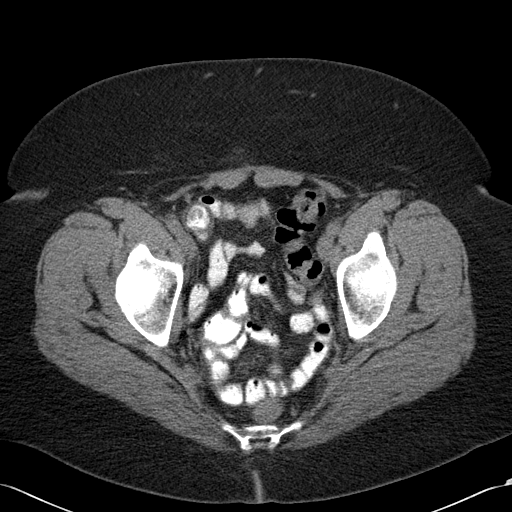
[im 25/122  soft-tissue]
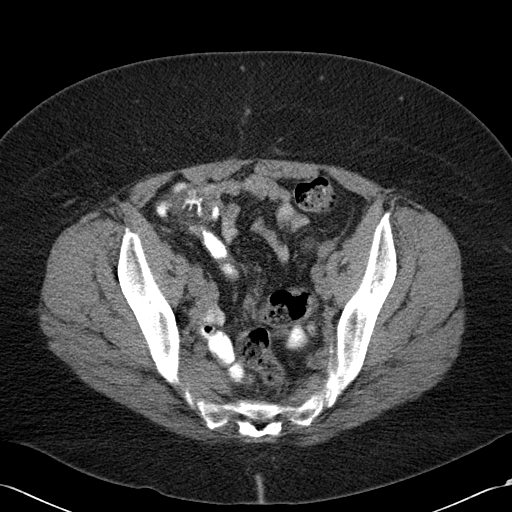
[im 37/122  soft-tissue]
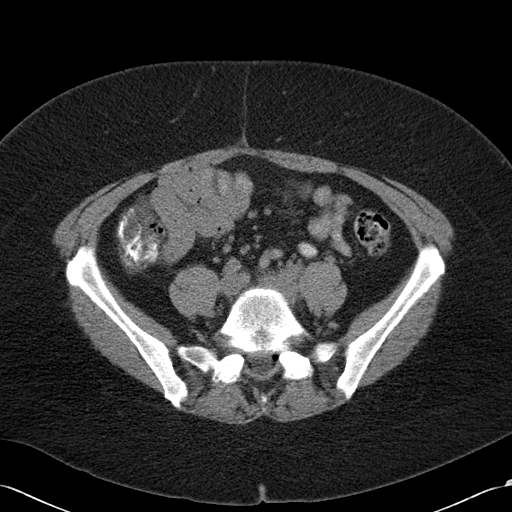
[im 43/122  soft-tissue]
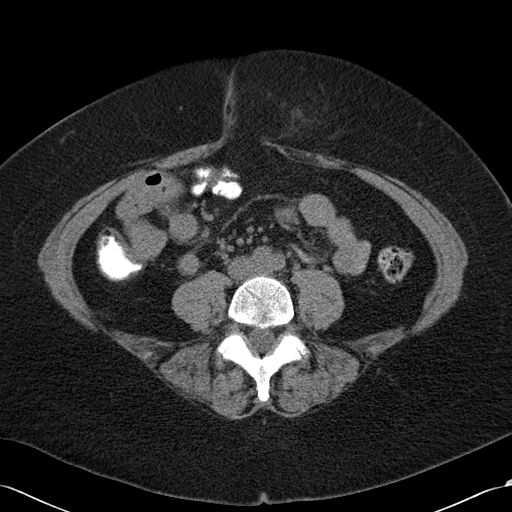
[im 55/122  soft-tissue]
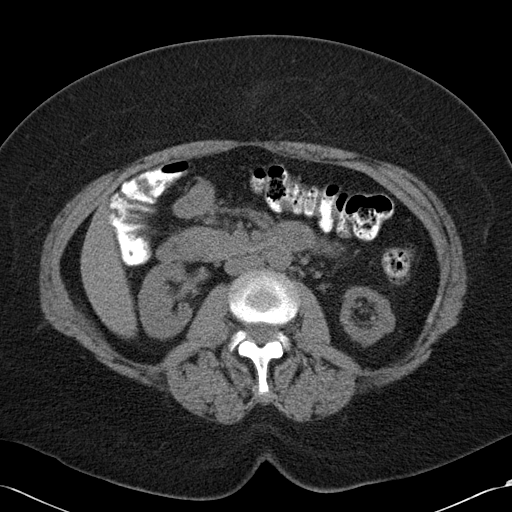
[im 61/122  soft-tissue]
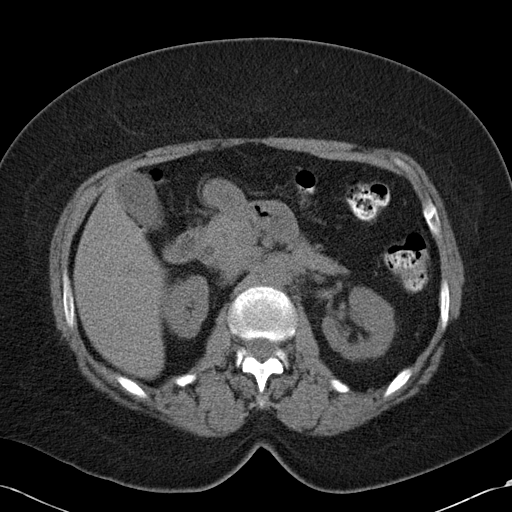
[im 67/122  soft-tissue]
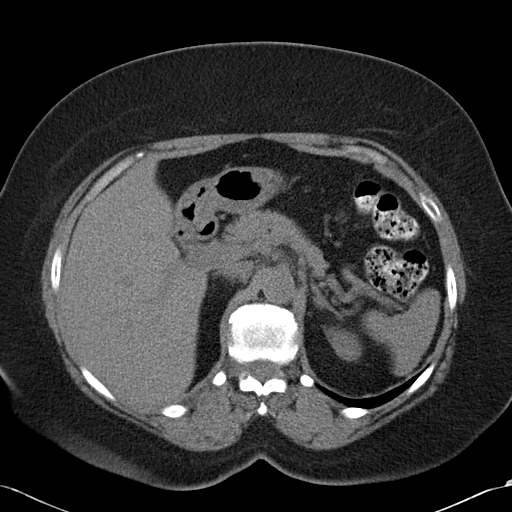
[im 79/122  soft-tissue]
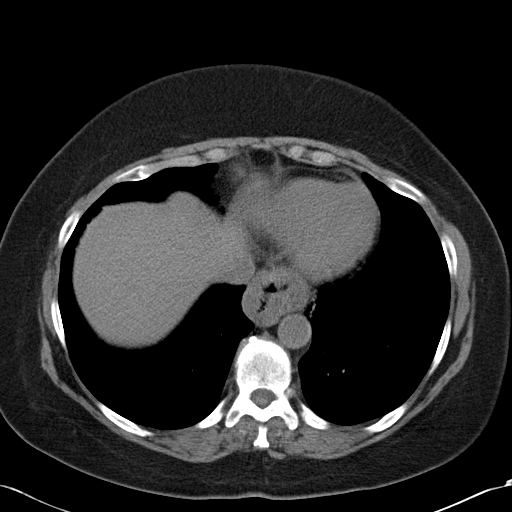
[im 79/122  bone]
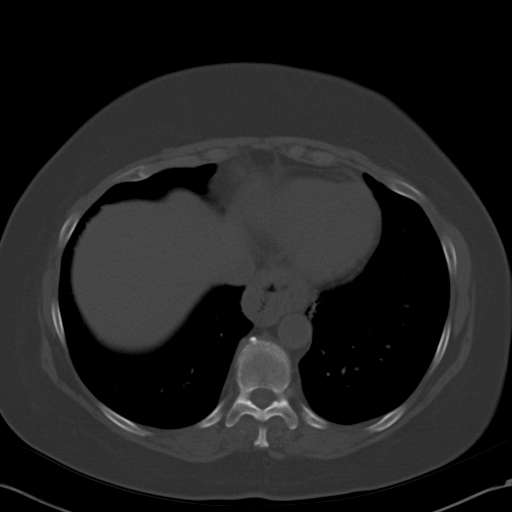
[im 85/122  soft-tissue]
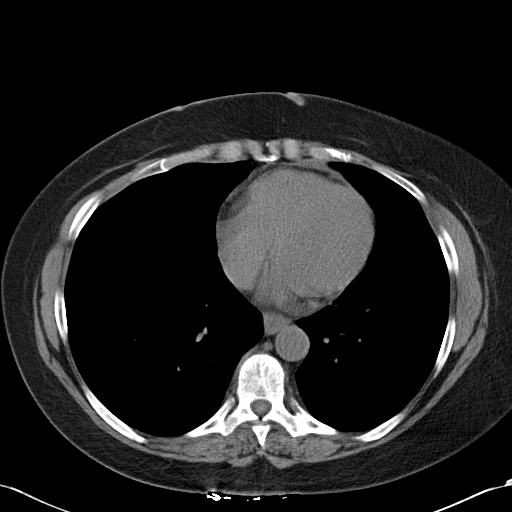
[im 97/122  soft-tissue]
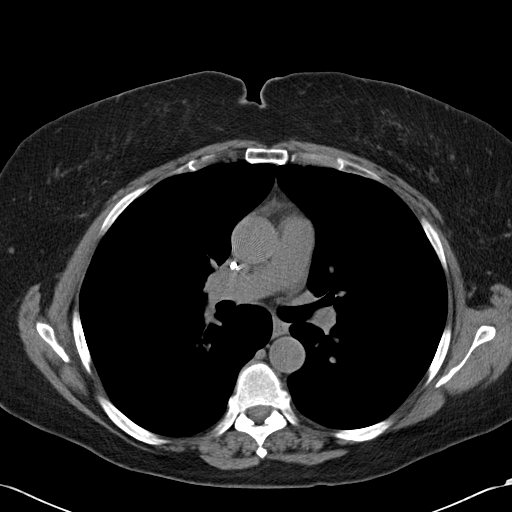
[im 103/122  soft-tissue]
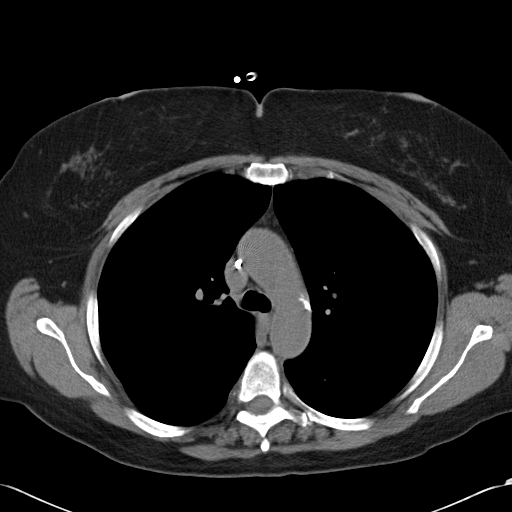
[im 115/122  soft-tissue]
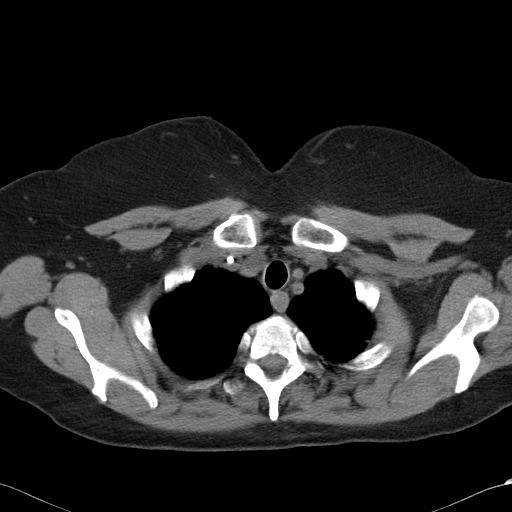

[Series 603: <mpr thick range(1)> · coronal · 1.19mm/px · 3 of 91 slices shown]
[im 31/91  soft-tissue]
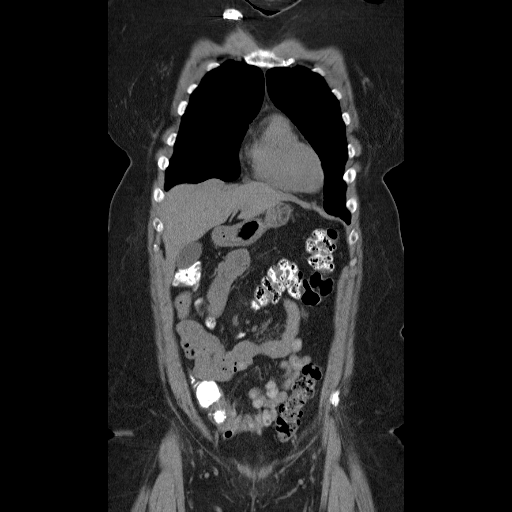
[im 41/91  soft-tissue]
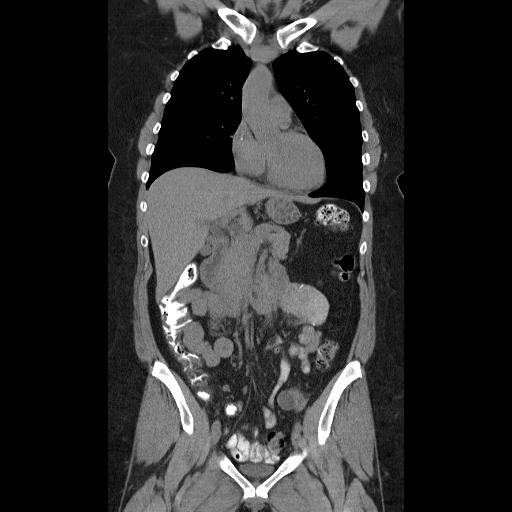
[im 51/91  soft-tissue]
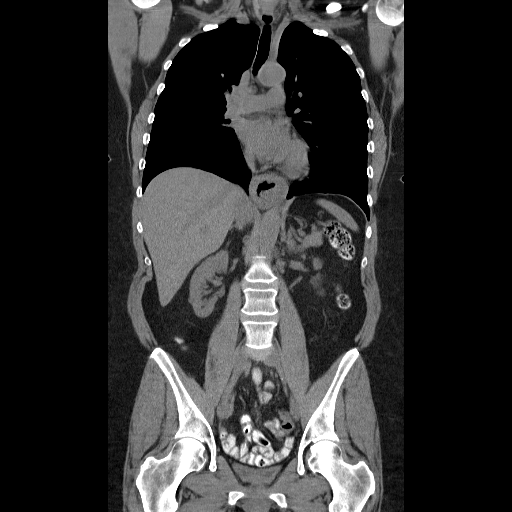

[16 of 46 positions shown; findings below may reference images not displayed]

FINDINGS: CT CHEST FINDINGS

Port in the right anterior chest wall. No axillary or
supraclavicular lymphadenopathy.

No mediastinal hilar lymphadenopathy. No pericardial fluid. There is
large hiatal hernia which is similar prior.

Review of the lung parenchyma demonstrates no pulmonary nodules.

CT ABDOMEN AND PELVIS FINDINGS

Hepatobiliary: Non IV contrast images demonstrate no focal hepatic
lesion. Gallbladder is normal. Common bile duct is normal.

Spleen: Normal spleen.

Pancreas: Pancreas is normal on  noncontrast exam.

Stomach/Bowel: Thickened hiatal hernia again demonstrated. The small
bowel demonstrates a anastomosis in the left mid abdomen. There is
some dilatation of the bowel at the anastomosis with pooling of
contrast. Similar to comparison exams. No bowel obstruction. The
more distal ileum is normal. The terminal ileum is normal. Colon and
rectosigmoid colon are.

Adrenals/urinary tract: Adrenal glands and kidneys are normal. No
ureterolithiasis. Bladder is normal.

Vascular/Lymphatic: Abdominal aorta is normal caliber. There is no
retroperitoneal or periportal lymphadenopathy. No inguinal
lymphadenopathy.

Reproductive: Prostate gland is normal.

Muskuloskeletal: No aggressive osseous lesion.

Other: There is a fat filled umbilical hernia measuring 8.5 cm with
a wide-mouth measuring 2.8 cm. No bowel enters the hernia.
IMPRESSION: Chest Impression:

1. No evidence of lymphoma recurrence in the  thorax.

Abdomen / Pelvis Impression:

1. No evidence of lymphoma recurrence within the abdomen or pelvis.
No lymphadenopathy. No splenomegaly.
2. Large thick-walled hiatal hernia is similar to comparison exams.

## 2014-03-07 MED ORDER — HEPARIN SOD (PORK) LOCK FLUSH 100 UNIT/ML IV SOLN
500.0000 [IU] | Freq: Once | INTRAVENOUS | Status: AC
  Administered 2014-03-07: 500 [IU] via INTRAVENOUS
  Filled 2014-03-07: qty 5

## 2014-03-07 MED ORDER — SODIUM CHLORIDE 0.9 % IJ SOLN
10.0000 mL | INTRAMUSCULAR | Status: DC | PRN
  Administered 2014-03-07: 10 mL via INTRAVENOUS
  Filled 2014-03-07: qty 10

## 2014-03-07 NOTE — Patient Instructions (Signed)

## 2014-03-09 ENCOUNTER — Encounter: Payer: Self-pay | Admitting: Oncology

## 2014-03-09 ENCOUNTER — Ambulatory Visit (HOSPITAL_BASED_OUTPATIENT_CLINIC_OR_DEPARTMENT_OTHER): Payer: Medicare Other | Admitting: Oncology

## 2014-03-09 ENCOUNTER — Telehealth: Payer: Self-pay | Admitting: Oncology

## 2014-03-09 VITALS — BP 125/70 | HR 64 | Temp 97.9°F | Resp 19 | Ht 66.0 in | Wt 220.6 lb

## 2014-03-09 DIAGNOSIS — D649 Anemia, unspecified: Secondary | ICD-10-CM

## 2014-03-09 DIAGNOSIS — C8299 Follicular lymphoma, unspecified, extranodal and solid organ sites: Secondary | ICD-10-CM

## 2014-03-09 DIAGNOSIS — C8583 Other specified types of non-Hodgkin lymphoma, intra-abdominal lymph nodes: Secondary | ICD-10-CM

## 2014-03-09 DIAGNOSIS — E039 Hypothyroidism, unspecified: Secondary | ICD-10-CM

## 2014-03-09 NOTE — Telephone Encounter (Signed)
gv adn printed appt sched and avs for pt for Sept/NOV/Jan and March

## 2014-03-09 NOTE — Progress Notes (Signed)
Hematology and Oncology Follow Up Visit  Christine Morales [id number] 18-Dec-1945 68 y.o. 03/09/2014 9:34 AM  Principle Diagnosis: This is a 68 year old female with follicular lymphoma, non-Hodgkin type, stage IIA, presented with a large mesenteric mass in July 2011.  Prior Therapy: The patient received 5 cycles of bendamustine and rituximab, finished in December 2011.  She had a complete response at that time.  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  Current therapy: Observation and surveillance.   Interim History:  Christine Morales presents today for a followup visit. Since the last time, she she reports no complaints. She is becoming more active and exercising regularly at the [facility]. She reported any bulky adenopathy or any constitutional symptoms.. She did not report any nausea, did not report any vomiting. She did not report any fevers, did not report any chills. She has not reported any headaches or blurry vision or double vision. Has not reported any lymphadenopathy or petechiae. Has not reported any chest pain or shortness of breath. Has not reported any cough or hemoptysis. Is not reporting any change in her bowel habits. Her rest of review of systems was unremarkable.  Medications: I have reviewed the patient's current medications. No change by my review. Current Outpatient Prescriptions  Medication Sig Dispense Refill  . amLODipine (NORVASC) 5 MG tablet take 1 tablet by mouth once daily  90 tablet  2  . Cholecalciferol (VITAMIN D3) 1000 UNITS tablet Take 1,000 Units by mouth daily.        . ferrous sulfate (FERRO-BOB) 325 (65 FE) MG tablet Take 325 mg by mouth daily with breakfast.        . levothyroxine (SYNTHROID, LEVOTHROID) 100 MCG tablet Take 1 tablet (100 mcg total) by mouth daily.  30 tablet  11  . lidocaine-prilocaine (EMLA) cream Apply 1 application topically  as needed. Apply to port-a-cath site 1 to 2 hours prior to appt.  30 g  1  . losartan (COZAAR) 100 MG tablet take 1 tablet by mouth once daily  30 tablet  5   No current facility-administered medications for this visit.   Facility-Administered Medications Ordered in Other Visits  Medication Dose Route Frequency Provider Last Rate Last Dose  . heparin lock flush 100 unit/mL  500 Units Intravenous Once Wyatt Portela, MD      . sodium chloride 0.9 % injection 10 mL  10 mL Intravenous PRN Wyatt Portela, MD        Allergies: No Known Allergies  Physical Exam: Blood pressure 125/70, pulse 64, temperature 97.9 F (36.6 C), temperature source Oral, resp. rate 19, height 5\' 6"  (1.676 m), weight 220 lb 9.6 oz (100.064 kg), SpO2 100.00%. ECOG: 1 General appearance: alert not in any distress. Head: Normocephalic, without obvious abnormality, atraumatic Neck: no adenopathy, no thyroid masses. Lymph nodes: Cervical, supraclavicular, and axillary nodes normal. Heart:regular rate and rhythm, S1, S2 normal, no murmur, click, rub or gallop Lung:chest clear, no wheezing, rales, normal symmetric air entry Abdomin: soft, non-tender, without masses or organomegaly EXT:no erythema, induration, or nodules   Lab Results: Lab Results  Component Value Date   WBC 4.6 03/07/2014   HGB 11.7 03/07/2014   HCT 35.9 03/07/2014   MCV 90.2 03/07/2014   PLT 240 03/07/2014    Impression and Plan:  This is a pleasant 68 year old female with the following issues:  1. Follicular lymphoma, stage  IIA disease, presenting with a large mesenteric mass, status post systemic chemotherapy completed in December 2011.  She is currently on active surveillance. CT scan from 03/07/2014  did not show any evidence of relapse. The plan is to continue follow up with a clinical visit in 6 months and repeat imaging in 12 months. 2. Port-A-Cath management.  We will flush her Port-A-Cath every 8 weeks.  I discussed with her the risks and  benefits of keeping this Port-A-Cath versus removal. We have elected to keep it for the time being. 3.     Mild Anemia: likely renal insufficiency related and stable. 4.     Hypothyroidism: Followed by her primary care physician.     [facility], MD 9/17/20159:34 AM

## 2014-03-11 ENCOUNTER — Other Ambulatory Visit: Payer: Self-pay | Admitting: Internal Medicine

## 2014-03-16 ENCOUNTER — Encounter: Payer: Self-pay | Admitting: Internal Medicine

## 2014-03-16 ENCOUNTER — Ambulatory Visit (INDEPENDENT_AMBULATORY_CARE_PROVIDER_SITE_OTHER): Payer: Medicare Other | Admitting: Internal Medicine

## 2014-03-16 VITALS — BP 118/76 | HR 72 | Temp 98.2°F | Resp 16 | Wt 221.0 lb

## 2014-03-16 DIAGNOSIS — E538 Deficiency of other specified B group vitamins: Secondary | ICD-10-CM

## 2014-03-16 DIAGNOSIS — I10 Essential (primary) hypertension: Secondary | ICD-10-CM

## 2014-03-16 DIAGNOSIS — Z23 Encounter for immunization: Secondary | ICD-10-CM

## 2014-03-16 DIAGNOSIS — E039 Hypothyroidism, unspecified: Secondary | ICD-10-CM

## 2014-03-16 DIAGNOSIS — N183 Chronic kidney disease, stage 3 unspecified: Secondary | ICD-10-CM

## 2014-03-16 MED ORDER — LOSARTAN POTASSIUM 100 MG PO TABS: 100.0000 mg | ORAL_TABLET | Freq: Every day | ORAL | Status: DC

## 2014-03-16 MED ORDER — AMLODIPINE BESYLATE 5 MG PO TABS: 5.0000 mg | ORAL_TABLET | Freq: Every day | ORAL | Status: DC

## 2014-03-16 NOTE — Progress Notes (Signed)
Pre visit review using our clinic review tool, if applicable. No additional management support is needed unless otherwise documented below in the visit note. 

## 2014-03-16 NOTE — Assessment & Plan Note (Signed)
Labs

## 2014-03-16 NOTE — Progress Notes (Signed)
   Subjective:    HPI   The patient presents for a follow-up of  chronic hypertension, anemia, non-Hodgkin's lymphoma, vit B12 def controlled with medicines.  F/u on wt loss - stable   Wt Readings from Last 3 Encounters:  03/16/14 221 lb (100.245 kg)  03/09/14 220 lb 9.6 oz (100.064 kg)  01/06/14 220 lb (99.791 kg)   BP Readings from Last 3 Encounters:  03/16/14 118/76  03/09/14 125/70  03/07/14 112/68      Review of Systems  Constitutional: Negative for chills, activity change, appetite change, fatigue and unexpected weight change.  HENT: Negative for congestion, mouth sores and sinus pressure.   Eyes: Negative for visual disturbance.  Respiratory: Negative for cough and chest tightness.   Gastrointestinal: Negative for nausea and abdominal pain.  Genitourinary: Negative for frequency, difficulty urinating and vaginal pain.  Musculoskeletal: Negative for back pain and gait problem.  Skin: Negative for pallor and rash.  Neurological: Negative for dizziness, tremors, weakness, numbness and headaches.  Psychiatric/Behavioral: Negative for confusion and sleep disturbance.       Objective:   Physical Exam  Constitutional: She appears well-developed and well-nourished. No distress.  HENT:  Head: Normocephalic.  Right Ear: External ear normal.  Left Ear: External ear normal.  Nose: Nose normal.  Mouth/Throat: Oropharynx is clear and moist.  Eyes: Conjunctivae are normal. Pupils are equal, round, and reactive to light. Right eye exhibits no discharge. Left eye exhibits no discharge.  Neck: Normal range of motion. Neck supple. No JVD present. No tracheal deviation present. No thyromegaly present.  Cardiovascular: Normal rate, regular rhythm and normal heart sounds.   Pulmonary/Chest: No stridor. No respiratory distress. She has no wheezes.  Abdominal: Soft. Bowel sounds are normal. She exhibits no distension and no mass. There is no tenderness. There is no rebound and no  guarding.  Musculoskeletal: She exhibits no edema and no tenderness.  Lymphadenopathy:    She has no cervical adenopathy.  Neurological: She displays normal reflexes. No cranial nerve deficit. She exhibits normal muscle tone. Coordination normal.  Skin: No rash noted. No erythema.  Psychiatric: She has a normal mood and affect. Her behavior is normal. Judgment and thought content normal.     Lab Results  Component Value Date   WBC 4.6 03/07/2014   HGB 11.7 03/07/2014   HCT 35.9 03/07/2014   PLT 240 03/07/2014   CHOL 223* 09/12/2013   TRIG 121.0 09/12/2013   HDL 54.20 09/12/2013   ALT 10 03/07/2014   AST 17 03/07/2014   NA 143 03/07/2014   K 4.4 03/07/2014   CL 108 09/12/2013   CREATININE 2.3* 03/07/2014   BUN 23.2 03/07/2014   CO2 23 03/07/2014   TSH 87.29* 09/12/2013   INR 1.08 01/27/2010        Assessment & Plan:

## 2014-03-19 NOTE — Assessment & Plan Note (Signed)
Continue with current prescription therapy as reflected on the Med list.  

## 2014-04-21 ENCOUNTER — Other Ambulatory Visit: Payer: Self-pay | Admitting: Nurse Practitioner

## 2014-04-24 ENCOUNTER — Encounter: Payer: Self-pay | Admitting: Internal Medicine

## 2014-05-02 ENCOUNTER — Ambulatory Visit (HOSPITAL_BASED_OUTPATIENT_CLINIC_OR_DEPARTMENT_OTHER): Payer: Medicare Other

## 2014-05-02 VITALS — BP 118/76 | HR 70 | Temp 97.5°F

## 2014-05-02 DIAGNOSIS — C8290 Follicular lymphoma, unspecified, unspecified site: Secondary | ICD-10-CM

## 2014-05-02 DIAGNOSIS — Z95828 Presence of other vascular implants and grafts: Secondary | ICD-10-CM

## 2014-05-02 DIAGNOSIS — Z452 Encounter for adjustment and management of vascular access device: Secondary | ICD-10-CM

## 2014-05-02 MED ORDER — SODIUM CHLORIDE 0.9 % IJ SOLN
10.0000 mL | INTRAMUSCULAR | Status: DC | PRN
  Administered 2014-05-02: 10 mL via INTRAVENOUS
  Filled 2014-05-02: qty 10

## 2014-05-02 MED ORDER — HEPARIN SOD (PORK) LOCK FLUSH 100 UNIT/ML IV SOLN
500.0000 [IU] | Freq: Once | INTRAVENOUS | Status: AC
  Administered 2014-05-02: 500 [IU] via INTRAVENOUS
  Filled 2014-05-02: qty 5

## 2014-05-02 NOTE — Patient Instructions (Signed)

## 2014-06-27 ENCOUNTER — Ambulatory Visit (HOSPITAL_BASED_OUTPATIENT_CLINIC_OR_DEPARTMENT_OTHER): Payer: Medicare Other

## 2014-06-27 VITALS — BP 124/62 | HR 78 | Temp 97.7°F

## 2014-06-27 DIAGNOSIS — Z95828 Presence of other vascular implants and grafts: Secondary | ICD-10-CM

## 2014-06-27 DIAGNOSIS — Z452 Encounter for adjustment and management of vascular access device: Secondary | ICD-10-CM

## 2014-06-27 DIAGNOSIS — C8290 Follicular lymphoma, unspecified, unspecified site: Secondary | ICD-10-CM

## 2014-06-27 MED ORDER — SODIUM CHLORIDE 0.9 % IJ SOLN
10.0000 mL | INTRAMUSCULAR | Status: DC | PRN
  Administered 2014-06-27: 10 mL via INTRAVENOUS
  Filled 2014-06-27: qty 10

## 2014-06-27 MED ORDER — HEPARIN SOD (PORK) LOCK FLUSH 100 UNIT/ML IV SOLN
500.0000 [IU] | Freq: Once | INTRAVENOUS | Status: AC
  Administered 2014-06-27: 500 [IU] via INTRAVENOUS
  Filled 2014-06-27: qty 5

## 2014-06-27 NOTE — Patient Instructions (Signed)

## 2014-07-04 ENCOUNTER — Telehealth: Payer: Self-pay | Admitting: Oncology

## 2014-07-04 NOTE — Telephone Encounter (Signed)
Pt lft msg to confirm D/T, lft msg for pt confirming labs/flush/ov D/T.... Cherylann Banas

## 2014-08-22 ENCOUNTER — Telehealth: Payer: Self-pay | Admitting: Oncology

## 2014-08-22 ENCOUNTER — Ambulatory Visit (HOSPITAL_BASED_OUTPATIENT_CLINIC_OR_DEPARTMENT_OTHER): Payer: Medicare Other | Admitting: Oncology

## 2014-08-22 ENCOUNTER — Other Ambulatory Visit (HOSPITAL_BASED_OUTPATIENT_CLINIC_OR_DEPARTMENT_OTHER): Payer: Medicare Other

## 2014-08-22 ENCOUNTER — Ambulatory Visit (HOSPITAL_BASED_OUTPATIENT_CLINIC_OR_DEPARTMENT_OTHER): Payer: Medicare Other

## 2014-08-22 VITALS — BP 137/78 | HR 70 | Temp 97.8°F | Resp 18 | Ht 66.0 in | Wt 224.3 lb

## 2014-08-22 DIAGNOSIS — C8290 Follicular lymphoma, unspecified, unspecified site: Secondary | ICD-10-CM

## 2014-08-22 DIAGNOSIS — C8590 Non-Hodgkin lymphoma, unspecified, unspecified site: Secondary | ICD-10-CM

## 2014-08-22 DIAGNOSIS — D649 Anemia, unspecified: Secondary | ICD-10-CM

## 2014-08-22 DIAGNOSIS — E039 Hypothyroidism, unspecified: Secondary | ICD-10-CM

## 2014-08-22 DIAGNOSIS — C8593 Non-Hodgkin lymphoma, unspecified, intra-abdominal lymph nodes: Secondary | ICD-10-CM

## 2014-08-22 DIAGNOSIS — N289 Disorder of kidney and ureter, unspecified: Secondary | ICD-10-CM

## 2014-08-22 DIAGNOSIS — Z95828 Presence of other vascular implants and grafts: Secondary | ICD-10-CM

## 2014-08-22 LAB — CBC WITH DIFFERENTIAL/PLATELET
BASO%: 0.5 % (ref 0.0–2.0)
BASOS ABS: 0 10*3/uL (ref 0.0–0.1)
EOS ABS: 0.1 10*3/uL (ref 0.0–0.5)
EOS%: 3.1 % (ref 0.0–7.0)
HEMATOCRIT: 36.1 % (ref 34.8–46.6)
HEMOGLOBIN: 11.5 g/dL — AB (ref 11.6–15.9)
LYMPH#: 1.9 10*3/uL (ref 0.9–3.3)
LYMPH%: 44.3 % (ref 14.0–49.7)
MCH: 28.1 pg (ref 25.1–34.0)
MCHC: 32 g/dL (ref 31.5–36.0)
MCV: 87.9 fL (ref 79.5–101.0)
MONO#: 0.4 10*3/uL (ref 0.1–0.9)
MONO%: 8.9 % (ref 0.0–14.0)
NEUT%: 43.2 % (ref 38.4–76.8)
NEUTROS ABS: 1.9 10*3/uL (ref 1.5–6.5)
Platelets: 282 10*3/uL (ref 145–400)
RBC: 4.11 10*6/uL (ref 3.70–5.45)
RDW: 14.1 % (ref 11.2–14.5)
WBC: 4.4 10*3/uL (ref 3.9–10.3)

## 2014-08-22 LAB — COMPREHENSIVE METABOLIC PANEL (CC13)
ALBUMIN: 3.5 g/dL (ref 3.5–5.0)
ALT: 11 U/L (ref 0–55)
AST: 14 U/L (ref 5–34)
Alkaline Phosphatase: 90 U/L (ref 40–150)
Anion Gap: 11 mEq/L (ref 3–11)
BUN: 22 mg/dL (ref 7.0–26.0)
CHLORIDE: 109 meq/L (ref 98–109)
CO2: 24 meq/L (ref 22–29)
Calcium: 9.3 mg/dL (ref 8.4–10.4)
Creatinine: 1.9 mg/dL — ABNORMAL HIGH (ref 0.6–1.1)
EGFR: 30 mL/min/{1.73_m2} — ABNORMAL LOW (ref >90)
Glucose: 113 mg/dl (ref 70–140)
Potassium: 4.1 mEq/L (ref 3.5–5.1)
Sodium: 143 mEq/L (ref 136–145)
Total Bilirubin: 0.35 mg/dL (ref 0.20–1.20)
Total Protein: 6.4 g/dL (ref 6.4–8.3)

## 2014-08-22 MED ORDER — SODIUM CHLORIDE 0.9 % IJ SOLN
10.0000 mL | INTRAMUSCULAR | Status: DC | PRN
  Administered 2014-08-22: 10 mL via INTRAVENOUS
  Filled 2014-08-22: qty 10

## 2014-08-22 MED ORDER — HEPARIN SOD (PORK) LOCK FLUSH 100 UNIT/ML IV SOLN
500.0000 [IU] | Freq: Once | INTRAVENOUS | Status: AC
  Administered 2014-08-22: 500 [IU] via INTRAVENOUS
  Filled 2014-08-22: qty 5

## 2014-08-22 NOTE — Progress Notes (Signed)
Hematology and Oncology Follow Up Visit  Christine Morales [id number] 1945-12-29 69 y.o. 08/22/2014 9:42 AM  Principle Diagnosis: This is a 69 year old female with follicular lymphoma, non-Hodgkin type, stage IIA, presented with a large mesenteric mass in July 2011.  Prior Therapy: The patient received 5 cycles of bendamustine and rituximab, finished in December 2011.  She had a complete response at that time.  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  Current therapy: Observation and surveillance.   Interim History:  Christine Morales presents today for a followup visit. Since the last time, she continues to do very well. She does not report any new symptoms to suggest recurrent disease. She has not reported any lymphadenopathy or petechiae. She has not reported any constitutional symptoms of fevers, chills or weight loss. She is active and exercising regularly at the [facility].  She did not report any nausea, did not report any vomiting. She did not report any fevers, did not report any chills. She has not reported any headaches or blurry vision or double vision. Has not reported any lymphadenopathy or petechiae. Has not reported any chest pain or shortness of breath. Has not reported any cough or hemoptysis. Is not reporting any change in her bowel habits. Her rest of review of systems was unremarkable.  Medications: I have reviewed the patient's current medications. No change by my review. Current Outpatient Prescriptions  Medication Sig Dispense Refill  . amLODipine (NORVASC) 5 MG tablet Take 1 tablet (5 mg total) by mouth daily. 30 tablet 11  . Cholecalciferol (VITAMIN D3) 1000 UNITS tablet Take 1,000 Units by mouth daily.      . ferrous sulfate (FERRO-BOB) 325 (65 FE) MG tablet Take 325 mg by mouth daily with breakfast.      . levothyroxine (SYNTHROID, LEVOTHROID) 100 MCG tablet  Take 1 tablet (100 mcg total) by mouth daily. 30 tablet 11  . lidocaine-prilocaine (EMLA) cream Apply 1 application topically as needed. Apply to port-a-cath site 1 to 2 hours prior to appt. 30 g 1  . losartan (COZAAR) 100 MG tablet Take 1 tablet (100 mg total) by mouth daily. 30 tablet 11   No current facility-administered medications for this visit.   Facility-Administered Medications Ordered in Other Visits  Medication Dose Route Frequency Provider Last Rate Last Dose  . heparin lock flush 100 unit/mL  500 Units Intravenous Once Wyatt Portela, MD      . sodium chloride 0.9 % injection 10 mL  10 mL Intravenous PRN Wyatt Portela, MD        Allergies: No Known Allergies  Physical Exam: Blood pressure 137/78, pulse 70, temperature 97.8 F (36.6 C), temperature source Oral, resp. rate 18, height 5\' 6"  (1.676 m), weight 224 lb 4.8 oz (101.742 kg), SpO2 100 %. ECOG: 1 General appearance: alert not in any distress. Head: Normocephalic, without obvious abnormality Neck: no adenopathy, no thyroid masses. Lymph nodes: Cervical, supraclavicular, and axillary nodes normal. Heart:regular rate and rhythm, S1, S2 normal, no murmur, click, rub or gallop Lung:chest clear, no wheezing, rales, normal symmetric air entry Abdomin: soft, non-tender, without masses or organomegaly EXT:no erythema, induration, or nodules   Lab Results: Lab Results  Component Value Date   WBC 4.4 08/22/2014   HGB 11.5* 08/22/2014   HCT 36.1 08/22/2014   MCV 87.9 08/22/2014   PLT 282 08/22/2014    Impression and Plan:  This is a pleasant 69 year old female with the following issues:  1. Follicular lymphoma, stage IIA disease, presenting with a large mesenteric mass, status post systemic chemotherapy completed in December 2011.  She is currently on active surveillance. CT scan from 03/07/2014 did not show any evidence of relapse. Clinically she is doing very well today without any evidence to suggest recurrent  disease. The plan is to continue with active surveillance and repeat imaging studies in 6 months. After that they will be done annually. 2. Port-A-Cath management.  We will flush her Port-A-Cath every 8 weeks.   3.     Mild Anemia: likely renal insufficiency related and stable. 4.     Hypothyroidism: Followed by her primary care physician. 5.     Follow up: in 6 months for labs + CT.      Zola Button, MD 3/1/20169:42 AM

## 2014-08-22 NOTE — Telephone Encounter (Signed)
Pt confirmed labs/ov per 03/01 POF, gave pt AVS... KJ °

## 2014-08-22 NOTE — Patient Instructions (Signed)

## 2014-10-10 ENCOUNTER — Other Ambulatory Visit: Payer: Self-pay | Admitting: Internal Medicine

## 2014-10-24 ENCOUNTER — Other Ambulatory Visit: Payer: Self-pay | Admitting: Internal Medicine

## 2014-10-31 ENCOUNTER — Telehealth: Payer: Self-pay | Admitting: Oncology

## 2014-10-31 NOTE — Telephone Encounter (Signed)
pt came by to get contrast and sch

## 2014-12-19 ENCOUNTER — Ambulatory Visit (HOSPITAL_BASED_OUTPATIENT_CLINIC_OR_DEPARTMENT_OTHER): Payer: Medicare Other

## 2014-12-19 ENCOUNTER — Other Ambulatory Visit: Payer: Medicare Other

## 2014-12-19 VITALS — BP 105/61 | HR 81 | Temp 97.6°F | Resp 16

## 2014-12-19 DIAGNOSIS — C8290 Follicular lymphoma, unspecified, unspecified site: Secondary | ICD-10-CM | POA: Diagnosis not present

## 2014-12-19 DIAGNOSIS — Z452 Encounter for adjustment and management of vascular access device: Secondary | ICD-10-CM

## 2014-12-19 DIAGNOSIS — Z95828 Presence of other vascular implants and grafts: Secondary | ICD-10-CM

## 2014-12-19 MED ORDER — HEPARIN SOD (PORK) LOCK FLUSH 100 UNIT/ML IV SOLN
500.0000 [IU] | Freq: Once | INTRAVENOUS | Status: AC
  Administered 2014-12-19: 500 [IU] via INTRAVENOUS
  Filled 2014-12-19: qty 5

## 2014-12-19 MED ORDER — SODIUM CHLORIDE 0.9 % IJ SOLN
10.0000 mL | INTRAMUSCULAR | Status: DC | PRN
  Administered 2014-12-19: 10 mL via INTRAVENOUS
  Filled 2014-12-19: qty 10

## 2014-12-19 NOTE — Patient Instructions (Signed)

## 2015-01-15 ENCOUNTER — Telehealth: Payer: Self-pay

## 2015-01-15 NOTE — Telephone Encounter (Signed)
Patient called to educate on Medicare Wellness apt. LVM for the patient to call back to educate and schedule for wellness visit.   

## 2015-01-18 NOTE — Telephone Encounter (Signed)
Call to educate regarding AWV as well as let the patient know that it is time for her annual CPE. Stated she would have access to her calander and will call back next week and schedule with Plotnikov for CPE and agreed to schedule AWV on a separate day if possible and covenient to do so.

## 2015-01-23 LAB — HM MAMMOGRAPHY

## 2015-01-24 ENCOUNTER — Encounter: Payer: Self-pay | Admitting: Internal Medicine

## 2015-01-28 ENCOUNTER — Encounter (HOSPITAL_COMMUNITY): Payer: Self-pay | Admitting: Emergency Medicine

## 2015-01-28 ENCOUNTER — Emergency Department (HOSPITAL_COMMUNITY)
Admission: EM | Admit: 2015-01-28 | Discharge: 2015-01-28 | Disposition: A | Discharge status: Home / Self Care | Payer: Medicare Other | Attending: Emergency Medicine | Admitting: Emergency Medicine

## 2015-01-28 ENCOUNTER — Emergency Department (HOSPITAL_COMMUNITY): Payer: Medicare Other

## 2015-01-28 DIAGNOSIS — Z8711 Personal history of peptic ulcer disease: Secondary | ICD-10-CM | POA: Insufficient documentation

## 2015-01-28 DIAGNOSIS — Z79899 Other long term (current) drug therapy: Secondary | ICD-10-CM | POA: Diagnosis not present

## 2015-01-28 DIAGNOSIS — Z8719 Personal history of other diseases of the digestive system: Secondary | ICD-10-CM | POA: Insufficient documentation

## 2015-01-28 DIAGNOSIS — Z8572 Personal history of non-Hodgkin lymphomas: Secondary | ICD-10-CM | POA: Diagnosis not present

## 2015-01-28 DIAGNOSIS — Y9289 Other specified places as the place of occurrence of the external cause: Secondary | ICD-10-CM | POA: Insufficient documentation

## 2015-01-28 DIAGNOSIS — Y9389 Activity, other specified: Secondary | ICD-10-CM | POA: Diagnosis not present

## 2015-01-28 DIAGNOSIS — S99921A Unspecified injury of right foot, initial encounter: Secondary | ICD-10-CM | POA: Diagnosis present

## 2015-01-28 DIAGNOSIS — S9031XA Contusion of right foot, initial encounter: Secondary | ICD-10-CM | POA: Diagnosis not present

## 2015-01-28 DIAGNOSIS — W228XXA Striking against or struck by other objects, initial encounter: Secondary | ICD-10-CM | POA: Diagnosis not present

## 2015-01-28 DIAGNOSIS — Y998 Other external cause status: Secondary | ICD-10-CM | POA: Insufficient documentation

## 2015-01-28 DIAGNOSIS — D649 Anemia, unspecified: Secondary | ICD-10-CM | POA: Insufficient documentation

## 2015-01-28 DIAGNOSIS — I10 Essential (primary) hypertension: Secondary | ICD-10-CM | POA: Insufficient documentation

## 2015-01-28 IMAGING — CR DG FOOT COMPLETE 3+V*R*
3 series · 3 of 3 positions shown · non-contrast
Comparison: None.

CLINICAL DATA: Injured foot at church today.  Metatarsal pain.

EXAM:
RIGHT FOOT COMPLETE - 3+ VIEW

[x foot ap right]
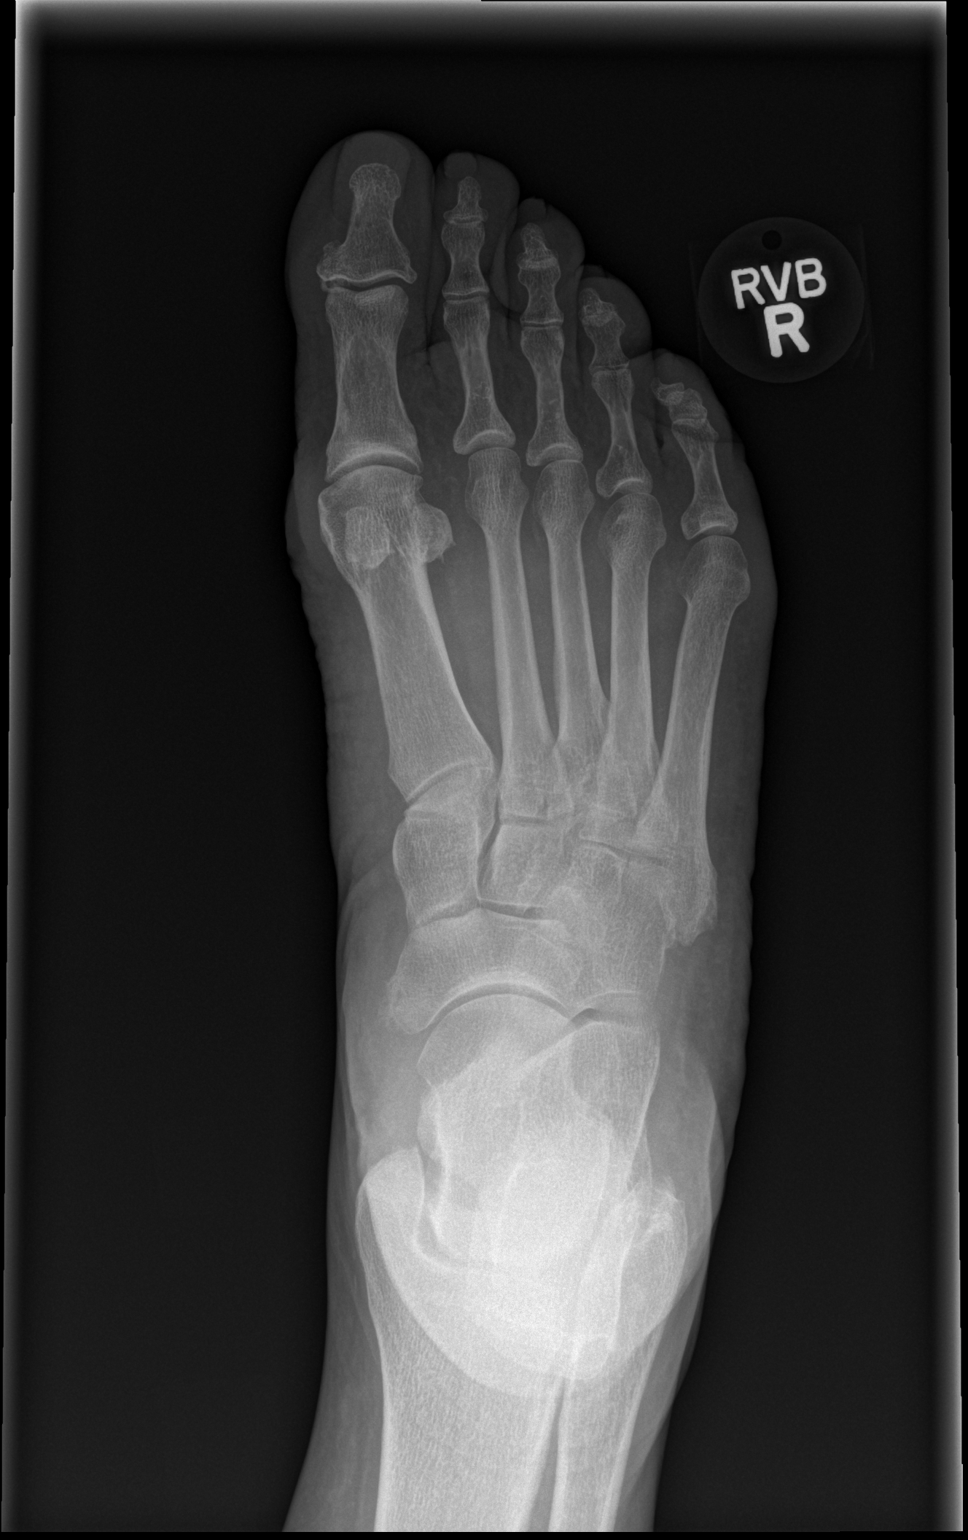

[x foot obl right]
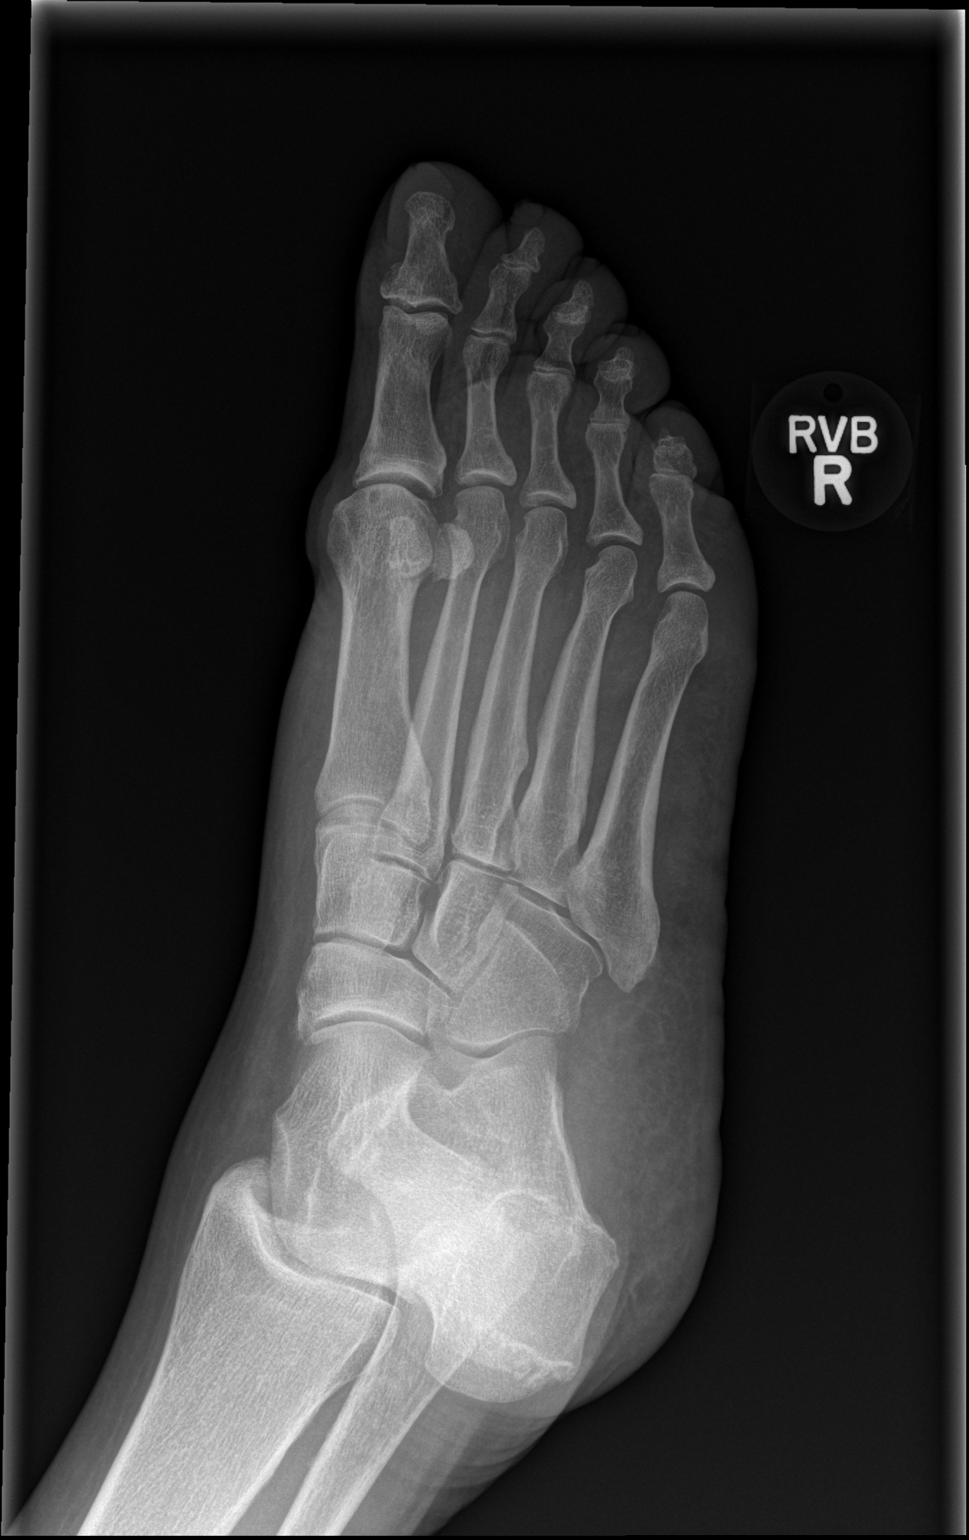

[x foot lat right]
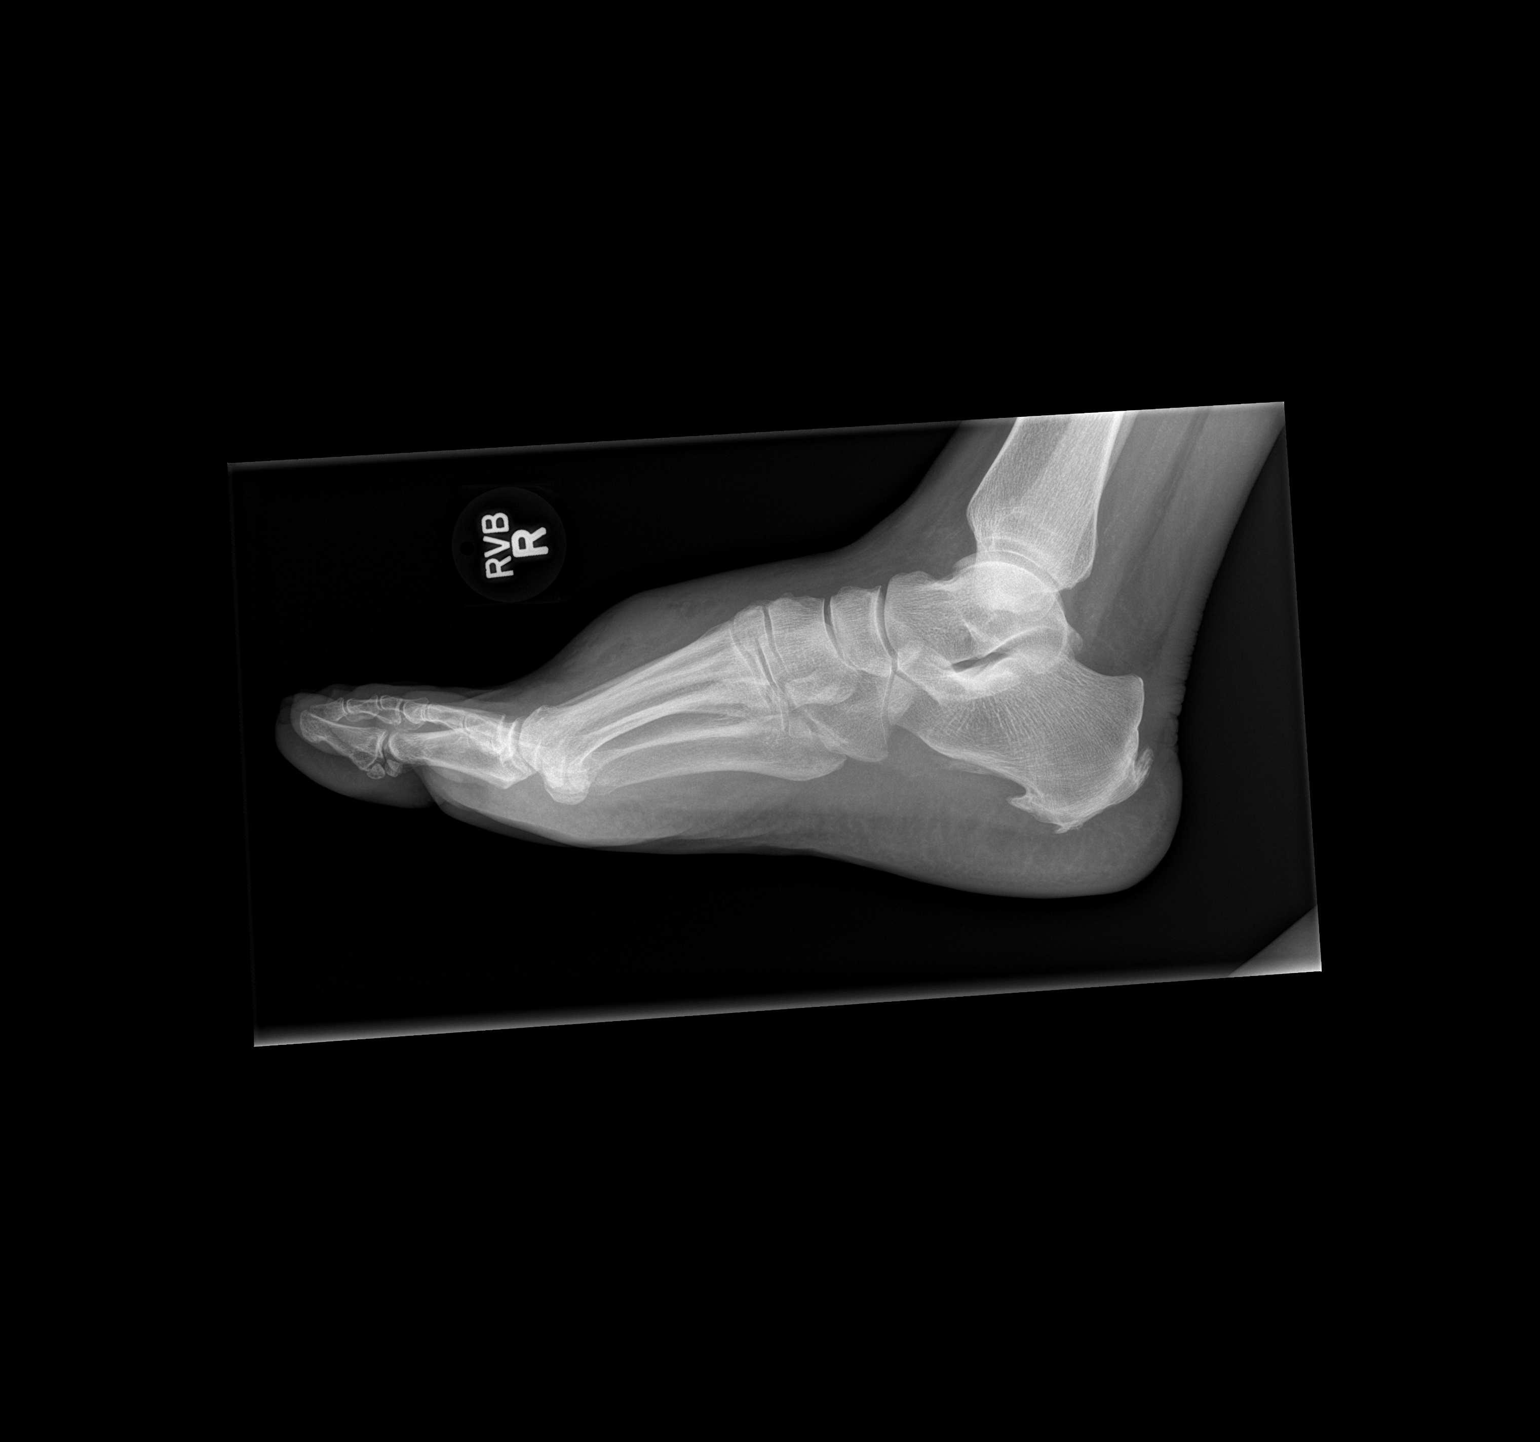

[3 of 3 positions shown; findings below may reference images not displayed]

FINDINGS: The joint spaces are maintained. There are mild degenerative
changes. No acute fractures identified. Moderate calcaneal heel
spurs. Focal dorsal soft tissue swelling/ hematoma likely due to
direct trauma.
IMPRESSION: No acute bony findings.

## 2015-01-28 NOTE — ED Notes (Signed)
Per pt was at church when hit her right foot against somebody's walker. Pt presents with right foot edema and some bruising. Pt is able to move toes and ankle fine.

## 2015-01-28 NOTE — ED Provider Notes (Signed)
CSN: [id number]     Arrival date & time 01/28/15  1611 History  This chart was scribed for Jeannett Senior, working with Forde Dandy, MD by Starleen Arms, ED Scribe. This patient was seen in room WTR5/WTR5 and the patient's care was started at 4:33 PM.   Chief Complaint  Patient presents with  . right foot swelling     injury   The history is provided by the patient. No language interpreter was used.   HPI Comments: Christine Morales is a 69 y.o. female who presents to the Emergency Department complaining of constant right foot pain and swelling onset PTA after the patient hit her foot against someone's walker at church.  She has not taken anything for this complaint.  Patient does not use anti-coagulants. Patient reports mild pain and significant swelling to the foot. No treatment prior to coming in. No prior foot injuries. denies any numbness or weakness to the foot.   Past Medical History  Diagnosis Date  . Hypertension   . Anemia     iron def  . Ulcer     peptic  . Rhinitis, allergic   . Helicobacter pylori ab+   . Hemorrhoids   . 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 nhl dx'd 12/2009    chemo comp 2011   Past Surgical History  Procedure Laterality Date  . Abdominal hysterectomy    . Tubal ligation    . Breast reduction surgery    . Partial colectomy      dr blackmon   Family History  Problem Relation Age of Onset  . Hypertension      FH  . Cholelithiasis Neg Hx   . Cancer Father   . Heart disease Sister   . Diabetes Brother    History  Substance Use Topics  . Smoking status: Never Smoker   . Smokeless tobacco: Not on file  . Alcohol Use: No   OB History    Gravida Para Term Preterm AB TAB SAB Ectopic Multiple Living   2 2        2      Review of Systems  Constitutional: Negative for fever.  Musculoskeletal: Positive for joint swelling and arthralgias.  Skin: Positive for color change.  Hematological:  Does not bruise/bleed easily.      Allergies  Review of patient's allergies indicates no known allergies.  Home Medications   Prior to Admission medications   Medication Sig Start Date End Date Taking? Authorizing Provider  amLODipine (NORVASC) 5 MG tablet Take 1 tablet (5 mg total) by mouth daily. 03/16/14   Aleksei Plotnikov V, MD  Cholecalciferol (VITAMIN D3) 1000 UNITS tablet Take 1,000 Units by mouth daily.      Historical Provider, MD  ferrous sulfate (FERRO-BOB) 325 (65 FE) MG tablet Take 325 mg by mouth daily with breakfast.      Historical Provider, MD  levothyroxine (SYNTHROID, LEVOTHROID) 100 MCG tablet take 1 tablet by mouth once daily 10/10/14   Aleksei Plotnikov V, MD  lidocaine-prilocaine (EMLA) cream Apply 1 application topically as needed. Apply to port-a-cath site 1 to 2 hours prior to appt. 11/07/13   Wyatt Portela, MD  losartan (COZAAR) 100 MG tablet Take 1 tablet (100 mg total) by mouth daily. 03/16/14   Aleksei Plotnikov V, MD  losartan (COZAAR) 100 MG tablet take 1 tablet by mouth once daily 10/24/14   Cassandria Anger, MD  BP 126/59 mmHg  Pulse 82  Temp(Src) 97.6 F (36.4 C) (Oral)  Resp 16  SpO2 100% Physical Exam  Constitutional: She is oriented to person, place, and time. She appears well-developed and well-nourished. No distress.  HENT:  Head: Normocephalic and atraumatic.  Eyes: Conjunctivae and EOM are normal.  Neck: Neck supple. No tracheal deviation present.  Cardiovascular: Normal rate.   Pulmonary/Chest: Effort normal. No respiratory distress.  Musculoskeletal: Normal range of motion.  Significant swelling to the dorsum of the right foot, with bruising.Tenderness to palpation over the second and third metatarsal. Full range of motion of the ankle. Full range motion of all toes. Toes are pink and warm, capillary refill less than 2 seconds distally. Dorsal pedal pulses palpated intact. There is no pulsatile swelling.  Neurological: She is alert and  oriented to person, place, and time.  Skin: Skin is warm and dry.  Psychiatric: She has a normal mood and affect. Her behavior is normal.  Nursing note and vitals reviewed.   ED Course  Procedures (including critical care time)  DIAGNOSTIC STUDIES: Oxygen Saturation is 100% on RA, normal by my interpretation.    COORDINATION OF CARE:  4:37 PM Discussed treatment plan with patient at bedside.  Patient acknowledges and agrees with plan.    Labs Review Labs Reviewed - No data to display  Imaging Review Dg Foot Complete Right  01/28/2015   CLINICAL DATA:  Injured foot at church today.  Metatarsal pain.  EXAM: RIGHT FOOT COMPLETE - 3+ VIEW  COMPARISON:  None.  FINDINGS: The joint spaces are maintained. There are mild degenerative changes. No acute fractures identified. Moderate calcaneal heel spurs. Focal dorsal soft tissue swelling/ hematoma likely due to direct trauma.  IMPRESSION: No acute bony findings.   Electronically Signed   By: Marijo Sanes M.D.   On: 01/28/2015 17:02     EKG Interpretation None      MDM   Final diagnoses:  Traumatic hematoma of right foot, initial encounter    Patient with right dorsal foot swelling after getting hit with a walker. X-rays negative. Exam is consistent with hematoma. Most likely from a venous bleed. At this time there is no enlargement of the hematoma. Ace wrap applied for compression. Instructed to keep her leg elevated at home, ice, NSAIDs or Tylenol for pain. Follow-up as needed. Return precautions discussed.  Filed Vitals:   01/28/15 1632  BP: 126/59  Pulse: 82  Temp: 97.6 F (36.4 C)  TempSrc: Oral  Resp: 16  SpO2: 100%    I personally performed the services described in this documentation, which was scribed in my presence. The recorded information has been reviewed and is accurate.   Jeannett Senior, PA-C 01/28/15 [place] Liu, MD 01/29/15 [phone]

## 2015-01-28 NOTE — Discharge Instructions (Signed)
Your x-ray looks normal today. Keep an Ace wrap for compression to stop the bleeding. Keep foot elevated up above your heart level. Ice packs several times a day. Follow-up with primary care doctor for recheck as needed. Return if for her toes are numb, increased pain or swelling.  Hematoma A hematoma is a collection of blood under the skin, in an organ, in a body space, in a joint space, or in other tissue. The blood can clot to form a lump that you can see and feel. The lump is often firm and may sometimes become sore and tender. Most hematomas get better in a few days to weeks. However, some hematomas may be serious and require medical care. Hematomas can range in size from very small to very large. CAUSES  A hematoma can be caused by a blunt or penetrating injury. It can also be caused by spontaneous leakage from a blood vessel under the skin. Spontaneous leakage from a blood vessel is more likely to occur in older people, especially those taking blood thinners. Sometimes, a hematoma can develop after certain medical procedures. SIGNS AND SYMPTOMS   A firm lump on the body.  Possible pain and tenderness in the area.  Bruising.Blue, dark blue, purple-red, or yellowish skin may appear at the site of the hematoma if the hematoma is close to the surface of the skin. For hematomas in deeper tissues or body spaces, the signs and symptoms may be subtle. For example, an intra-abdominal hematoma may cause abdominal pain, weakness, fainting, and shortness of breath. An intracranial hematoma may cause a headache or symptoms such as weakness, trouble speaking, or a change in consciousness. DIAGNOSIS  A hematoma can usually be diagnosed based on your medical history and a physical exam. Imaging tests may be needed if your health care provider suspects a hematoma in deeper tissues or body spaces, such as the abdomen, head, or chest. These tests may include ultrasonography or a CT scan.  TREATMENT  Hematomas  usually go away on their own over time. Rarely does the blood need to be drained out of the body. Large hematomas or those that may affect vital organs will sometimes need surgical drainage or monitoring. HOME CARE INSTRUCTIONS   Apply ice to the injured area:   Put ice in a plastic bag.   Place a towel between your skin and the bag.   Leave the ice on for 20 minutes, 2-3 times a day for the first 1 to 2 days.   After the first 2 days, switch to using warm compresses on the hematoma.   Elevate the injured area to help decrease pain and swelling. Wrapping the area with an elastic bandage may also be helpful. Compression helps to reduce swelling and promotes shrinking of the hematoma. Make sure the bandage is not wrapped too tight.   If your hematoma is on a lower extremity and is painful, crutches may be helpful for a couple days.   Only take over-the-counter or prescription medicines as directed by your health care provider. SEEK IMMEDIATE MEDICAL CARE IF:   You have increasing pain, or your pain is not controlled with medicine.   You have a fever.   You have worsening swelling or discoloration.   Your skin over the hematoma breaks or starts bleeding.   Your hematoma is in your chest or abdomen and you have weakness, shortness of breath, or a change in consciousness.  Your hematoma is on your scalp (caused by a fall or injury)  and you have a worsening headache or a change in alertness or consciousness. MAKE SURE YOU:   Understand these instructions.  Will watch your condition.  Will get help right away if you are not doing well or get worse. Document Released: 01/22/2004 Document Revised: 02/09/2013 Document Reviewed: 11/17/2012 [facility] Patient Information 2015 [place], [place]. This information is not intended to replace advice given to you by your health care provider. Make sure you discuss any questions you have with your health care provider.

## 2015-01-30 ENCOUNTER — Ambulatory Visit (INDEPENDENT_AMBULATORY_CARE_PROVIDER_SITE_OTHER): Payer: Medicare Other

## 2015-01-30 VITALS — BP 142/82 | Ht 66.0 in | Wt 219.5 lb

## 2015-01-30 DIAGNOSIS — Z0000 Encounter for general adult medical examination without abnormal findings: Secondary | ICD-10-CM | POA: Diagnosis not present

## 2015-01-30 DIAGNOSIS — M8580 Other specified disorders of bone density and structure, unspecified site: Secondary | ICD-10-CM

## 2015-01-30 NOTE — Patient Instructions (Addendum)
[name] , Thank you for taking time to come for your Medicare Wellness Visit. I appreciate your ongoing commitment to your health goals. Please review the following plan we discussed and let me know if I can assist you in the future.   To do; Pt determined a personalized goal; see patient goals; 1. Water aerobics 2. To have eye exam 3. To complete dexa 4. To take prevnar after discussing with dr. Alain Marion    These are the goals we discussed: Goals    None      This is a list of the screening recommended for you and due dates:  Health Maintenance  Topic Date Due  .  Hepatitis C: One time screening is recommended by Center for Disease Control  (CDC) for  adults born from [birth year] through 1963.   Nov 21, 1945  . Tetanus Vaccine  03/16/1967  . Pneumonia vaccines (1 of 2 - PCV13) 09/06/2013  . Flu Shot  01/22/2015  . Shingles Vaccine  03/08/2022*  . Colon Cancer Screening  01/14/2017  . Mammogram  01/22/2017  . DEXA scan (bone density measurement)  Completed  *Topic was postponed. The date shown is not the original due date.   Bone Densitometry Bone densitometry is a special X-ray that measures your bone density and can be used to help predict your risk of bone fractures. This test is used to determine bone mineral content and density to diagnose osteoporosis. Osteoporosis is the loss of bone that may cause the bone to become weak. Osteoporosis commonly occurs in women entering menopause. However, it may be found in men and in people with other diseases. PREPARATION FOR TEST No preparation necessary. WHO SHOULD BE TESTED?  All women older than 95.  Postmenopausal women (50 to 15) with risk factors for osteoporosis.  People with a previous fracture caused by normal activities.  People with a small body frame (less than 127 poundsor a body mass index [BMI] of less than 21).  People who have a parent with a hip fracture or history of osteoporosis.  People who smoke.  People who  have rheumatoid arthritis.  Anyone who engages in excessive alcohol use (more than 3 drinks most days).  Women who experience early menopause. WHEN SHOULD YOU BE RETESTED? Current guidelines suggest that you should wait at least 2 years before doing a bone density test again if your first test was normal.Recent studies indicated that women with normal bone density may be able to wait a few years before needing to repeat a bone density test. You should discuss this with your caregiver.  NORMAL FINDINGS   Normal: less than standard deviation below normal (greater than -1).  Osteopenia: 1 to 2.5 standard deviations below normal (-1 to -2.5).  Osteoporosis: greater than 2.5 standard deviations below normal (less than -2.5). Test results are reported as a "T score" and a "Z score."The T score is a number that compares your bone density with the bone density of healthy, young women.The Z score is a number that compares your bone density with the scores of women who are the same age, gender, and race.  Ranges for normal findings may vary among different laboratories and hospitals. You should always check with your doctor after having lab work or other tests done to discuss the meaning of your test results and whether your values are considered within normal limits. MEANING OF TEST  Your caregiver will go over the test results with you and discuss the importance and meaning of your  results, as well as treatment options and the need for additional tests if necessary. OBTAINING THE TEST RESULTS It is your responsibility to obtain your test results. Ask the lab or department performing the test when and how you will get your results. Document Released: 07/01/2004 Document Revised: 09/01/2011 Document Reviewed: 07/24/2010 [facility] Patient Information 2015 [place], [place]. This information is not intended to replace advice given to you by your health care provider. Make sure you discuss any questions you  have with your health care provider.  Osteoporosis Throughout your life, your body breaks down old bone and replaces it with new bone. As you get older, your body does not replace bone as quickly as it breaks it down. By the age of 106 years, most people begin to gradually lose bone because of the imbalance between bone loss and replacement. Some people lose more bone than others. Bone loss beyond a specified normal degree is considered osteoporosis.  Osteoporosis affects the strength and durability of your bones. The inside of the ends of your bones and your flat bones, like the bones of your pelvis, look like honeycomb, filled with tiny open spaces. As bone loss occurs, your bones become less dense. This means that the open spaces inside your bones become bigger and the walls between these spaces become thinner. This makes your bones weaker. Bones of a person with osteoporosis can become so weak that they can break (fracture) during minor accidents, such as a simple fall. CAUSES  The following factors have been associated with the development of osteoporosis:  Smoking.  Drinking more than 2 alcoholic drinks several days per week.  Long-term use of certain medicines:  Corticosteroids.  Chemotherapy medicines.  Thyroid medicines.  Antiepileptic medicines.  Gonadal hormone suppression medicine.  Immunosuppression medicine.  Being underweight.  Lack of physical activity.  Lack of exposure to the sun. This can lead to vitamin D deficiency.  Certain medical conditions:  Certain inflammatory bowel diseases, such as Crohn disease and ulcerative colitis.  Diabetes.  Hyperthyroidism.  Hyperparathyroidism. RISK FACTORS Anyone can develop osteoporosis. However, the following factors can increase your risk of developing osteoporosis:  Gender--Women are at higher risk than men.  Age--Being older than 50 years increases your risk.  Ethnicity--White and Asian people have an increased  risk.  Weight --Being extremely underweight can increase your risk of osteoporosis.  Family history of osteoporosis--Having a family member who has developed osteoporosis can increase your risk. SYMPTOMS  Usually, people with osteoporosis have no symptoms.  DIAGNOSIS  Signs during a physical exam that may prompt your caregiver to suspect osteoporosis include:  Decreased height. This is usually caused by the compression of the bones that form your spine (vertebrae) because they have weakened and become fractured.  A curving or rounding of the upper back (kyphosis). To confirm signs of osteoporosis, your caregiver may request a procedure that uses 2 low-dose X-ray beams with different levels of energy to measure your bone mineral density (dual-energy X-ray absorptiometry [DXA]). Also, your caregiver may check your level of vitamin D. TREATMENT  The goal of osteoporosis treatment is to strengthen bones in order to decrease the risk of bone fractures. There are different types of medicines available to help achieve this goal. Some of these medicines work by slowing the processes of bone loss. Some medicines work by increasing bone density. Treatment also involves making sure that your levels of calcium and vitamin D are adequate. PREVENTION  There are things you can do to help prevent osteoporosis.  Adequate intake of calcium and vitamin D can help you achieve optimal bone mineral density. Regular exercise can also help, especially resistance and weight-bearing activities. If you smoke, quitting smoking is an important part of osteoporosis prevention. MAKE SURE YOU:  Understand these instructions.  Will watch your condition.  Will get help right away if you are not doing well or get worse. FOR MORE INFORMATION www.osteo.org and [email or web address].[email or web address].ee Document Released: 03/19/2005 Document Revised: 10/04/2012 Document Reviewed: 05/24/2011 [facility] Patient Information 2015 [place], [place]. This information  is not intended to replace advice given to you by your health care provider. Make sure you discuss any questions you have with your health care provider.   Fat and Cholesterol Control Diet Your diet has an affect on your fat and cholesterol levels in your blood and organs. Too much fat and cholesterol in your blood can affect your:  Heart.  Blood vessels (arteries, veins).  Gallbladder.  Liver.  Pancreas. CONTROL FAT AND CHOLESTEROL WITH DIET Certain foods raise cholesterol and others lower it. It is important to replace bad fats with other types of fat.  Do not eat:  Fatty meats, such as hot dogs and salami.  Stick margarine and some tub margarines that have "partially hydrogenated oils" in them.  Baked goods, such as cookies and crackers that have "partially hydrogenated oils" in them.  Saturated tropical oils, such as coconut and palm oil. Eat the following foods:  Round or loin cuts of red meat.  Chicken (without skin).  Fish.  Veal.  [place] breast.  Shellfish.  Fruit, such as apples.  Vegetables, such as broccoli, potatoes, and carrots.  Beans, peas, and lentils (legumes).  Grains, such as barley, rice, couscous, and bulgar wheat.  Pasta (without cream sauces). Look for foods that are nonfat, low in fat, and low in cholesterol.  FIND FOODS THAT ARE LOWER IN FAT AND CHOLESTEROL  Find foods with soluble fiber and plant sterols (phytosterol). You should eat 2 grams a day of these foods. These foods include:  Fruits.  Vegetables.  Whole grains.  Dried beans and peas.  Nuts and seeds.  Read package labels. Look for low-saturated fats, trans fat free, low-fat foods.  Choose cheese that have only 2 to 3 grams of saturated fat per ounce.  Use heart-healthy tub margarine that is free of trans fat or partially hydrogenated oil.  Avoid buying baked goods that have partially hydrogenated oils in them. Instead, buy baked goods made with whole  grains (whole-wheat or whole oat flour). Avoid baked goods labeled with "flour" or "enriched flour."  Buy non-creamy canned soups with reduced salt and no added fats. PREPARING YOUR FOOD  Broil, bake, steam, or roast foods. Do not fry food.  Use non-stick cooking sprays.  Use lemon or herbs to flavor food instead of using butter or stick margarine.  Use nonfat yogurt, salsa, or low-fat dressings for salads. LOW-SATURATED FAT / LOW-FAT FOOD SUBSTITUTES  Meats / Saturated Fat (g)  Avoid: Steak, marbled (3 oz/85 g) / 11 g.  Choose: Steak, lean (3 oz/85 g) / 4 g.  Avoid: Hamburger (3 oz/85 g) / 7 g.  Choose: Hamburger, lean (3 oz/85 g) / 5 g.  Avoid: Ham (3 oz/85 g) / 6 g.  Choose: Ham, lean cut (3 oz/85 g) / 2.4 g.  Avoid: Chicken, with skin, dark meat (3 oz/85 g) / 4 g.  Choose: Chicken, skin removed, dark meat (3 oz/85 g) / 2 g.  Avoid: Chicken, with skin, light meat (  3 oz/85 g) / 2.5 g.  Choose: Chicken, skin removed, light meat (3 oz/85 g) / 1 g. Dairy / Saturated Fat (g)  Avoid: Whole milk (1 cup) / 5 g.  Choose: Low-fat milk, 2% (1 cup) / 3 g.  Choose: Low-fat milk, 1% (1 cup) / 1.5 g.  Choose: Skim milk (1 cup) / 0.3 g.  Avoid: Hard cheese (1 oz/28 g) / 6 g.  Choose: Skim milk cheese (1 oz/28 g) / 2 to 3 g.  Avoid: Cottage cheese, 4% fat (1 cup) / 6.5 g.  Choose: Low-fat cottage cheese, 1% fat (1 cup) / 1.5 g.  Avoid: Ice cream (1 cup) / 9 g.  Choose: Sherbet (1 cup) / 2.5 g.  Choose: Nonfat frozen yogurt (1 cup) / 0.3 g.  Choose: Frozen fruit bar / trace.  Avoid: Whipped cream (1 tbs) / 3.5 g.  Choose: Nondairy whipped topping (1 tbs) / 1 g. Condiments / Saturated Fat (g)  Avoid: Mayonnaise (1 tbs) / 2 g.  Choose: Low-fat mayonnaise (1 tbs) / 1 g.  Avoid: Butter (1 tbs) / 7 g.  Choose: Extra light margarine (1 tbs) / 1 g.  Avoid: Coconut oil (1 tbs) / 11.8 g.  Choose: Olive oil (1 tbs) / 1.8 g.  Choose: Corn oil (1 tbs) / 1.7  g.  Choose: Safflower oil (1 tbs) / 1.2 g.  Choose: Sunflower oil (1 tbs) / 1.4 g.  Choose: Soybean oil (1 tbs) / 2.4 g .  Choose: Canola oil (1 tbs) / 1 g. Document Released: 12/09/2011 Document Revised: 02/09/2013 Document Reviewed: 09/08/2013 [facility] Patient Information 2015 [place], [place]. This information is not intended to replace advice given to you by your health care provider. Make sure you discuss any questions you have with your health care provider.  Health Maintenance Adopting a healthy lifestyle and getting preventive care can go a long way to promote health and wellness. Talk with your health care provider about what schedule of regular examinations is right for you. This is a good chance for you to check in with your provider about disease prevention and staying healthy. In between checkups, there are plenty of things you can do on your own. Experts have done a lot of research about which lifestyle changes and preventive measures are most likely to keep you healthy. Ask your health care provider for more information. WEIGHT AND DIET  Eat a healthy diet  Be sure to include plenty of vegetables, fruits, low-fat dairy products, and lean protein.  Do not eat a lot of foods high in solid fats, added sugars, or salt.  Get regular exercise. This is one of the most important things you can do for your health.  Most adults should exercise for at least 150 minutes each week. The exercise should increase your heart rate and make you sweat (moderate-intensity exercise).  Most adults should also do strengthening exercises at least twice a week. This is in addition to the moderate-intensity exercise.  Maintain a healthy weight  Body mass index (BMI) is a measurement that can be used to identify possible weight problems. It estimates body fat based on height and weight. Your health care provider can help determine your BMI and help you achieve or maintain a healthy weight.  For  females 79 years of age and older:   A BMI below 18.5 is considered underweight.  A BMI of 18.5 to 24.9 is normal.  A BMI of 25 to 29.9 is considered overweight.  A BMI of 30 and above is considered obese.  Watch levels of cholesterol and blood lipids  You should start having your blood tested for lipids and cholesterol at 67 years of age, then have this test every 5 years.  You may need to have your cholesterol levels checked more often if:  Your lipid or cholesterol levels are high.  You are older than 67 years of age.  You are at high risk for heart disease.  CANCER SCREENING   Lung Cancer  Lung cancer screening is recommended for adults 50-42 years old who are at high risk for lung cancer because of a history of smoking.  A yearly low-dose CT scan of the lungs is recommended for people who:  Currently smoke.  Have quit within the past 15 years.  Have at least a 30-pack-year history of smoking. A pack year is smoking an average of one pack of cigarettes a day for 1 year.  Yearly screening should continue until it has been 15 years since you quit.  Yearly screening should stop if you develop a health problem that would prevent you from having lung cancer treatment.  Breast Cancer  Practice breast self-awareness. This means understanding how your breasts normally appear and feel.  It also means doing regular breast self-exams. Let your health care provider know about any changes, no matter how small.  If you are in your 20s or 30s, you should have a clinical breast exam (CBE) by a health care provider every 1-3 years as part of a regular health exam.  If you are 67 or older, have a CBE every year. Also consider having a breast X-ray (mammogram) every year.  If you have a family history of breast cancer, talk to your health care provider about genetic screening.  If you are at high risk for breast cancer, talk to your health care provider about having an MRI and  a mammogram every year.  Breast cancer gene (BRCA) assessment is recommended for women who have family members with BRCA-related cancers. BRCA-related cancers include:  Breast.  Ovarian.  Tubal.  Peritoneal cancers.  Results of the assessment will determine the need for genetic counseling and BRCA1 and BRCA2 testing. Cervical Cancer Routine pelvic examinations to screen for cervical cancer are no longer recommended for nonpregnant women who are considered low risk for cancer of the pelvic organs (ovaries, uterus, and vagina) and who do not have symptoms. A pelvic examination may be necessary if you have symptoms including those associated with pelvic infections. Ask your health care provider if a screening pelvic exam is right for you.   The Pap test is the screening test for cervical cancer for women who are considered at risk.  If you had a hysterectomy for a problem that was not cancer or a condition that could lead to cancer, then you no longer need Pap tests.  If you are older than 65 years, and you have had normal Pap tests for the past 10 years, you no longer need to have Pap tests.  If you have had past treatment for cervical cancer or a condition that could lead to cancer, you need Pap tests and screening for cancer for at least 20 years after your treatment.  If you no longer get a Pap test, assess your risk factors if they change (such as having a new sexual partner). This can affect whether you should start being screened again.  Some women have medical problems that increase their chance of  getting cervical cancer. If this is the case for you, your health care provider may recommend more frequent screening and Pap tests.  The human papillomavirus (HPV) test is another test that may be used for cervical cancer screening. The HPV test looks for the virus that can cause cell changes in the cervix. The cells collected during the Pap test can be tested for HPV.  The HPV test can  be used to screen women 77 years of age and older. Getting tested for HPV can extend the interval between normal Pap tests from three to five years.  An HPV test also should be used to screen women of any age who have unclear Pap test results.  After 67 years of age, women should have HPV testing as often as Pap tests.  Colorectal Cancer  This type of cancer can be detected and often prevented.  Routine colorectal cancer screening usually begins at 67 years of age and continues through 67 years of age.  Your health care provider may recommend screening at an earlier age if you have risk factors for colon cancer.  Your health care provider may also recommend using home test kits to check for hidden blood in the stool.  A small camera at the end of a tube can be used to examine your colon directly (sigmoidoscopy or colonoscopy). This is done to check for the earliest forms of colorectal cancer.  Routine screening usually begins at age 70.  Direct examination of the colon should be repeated every 5-10 years through 67 years of age. However, you may need to be screened more often if early forms of precancerous polyps or small growths are found. Skin Cancer  Check your skin from head to toe regularly.  Tell your health care provider about any new moles or changes in moles, especially if there is a change in a mole's shape or color.  Also tell your health care provider if you have a mole that is larger than the size of a pencil eraser.  Always use sunscreen. Apply sunscreen liberally and repeatedly throughout the day.  Protect yourself by wearing long sleeves, pants, a wide-brimmed hat, and sunglasses whenever you are outside. HEART DISEASE, DIABETES, AND HIGH BLOOD PRESSURE   Have your blood pressure checked at least every 1-2 years. High blood pressure causes heart disease and increases the risk of stroke.  If you are between 68 years and 30 years old, ask your health care provider if  you should take aspirin to prevent strokes.  Have regular diabetes screenings. This involves taking a blood sample to check your fasting blood sugar level.  If you are at a normal weight and have a low risk for diabetes, have this test once every three years after 67 years of age.  If you are overweight and have a high risk for diabetes, consider being tested at a younger age or more often. PREVENTING INFECTION  Hepatitis B  If you have a higher risk for hepatitis B, you should be screened for this virus. You are considered at high risk for hepatitis B if:  You were born in a country where hepatitis B is common. Ask your health care provider which countries are considered high risk.  Your parents were born in a high-risk country, and you have not been immunized against hepatitis B (hepatitis B vaccine).  You have HIV or AIDS.  You use needles to inject street drugs.  You live with someone who has hepatitis B.  You have  had sex with someone who has hepatitis B.  You get hemodialysis treatment.  You take certain medicines for conditions, including cancer, organ transplantation, and autoimmune conditions. Hepatitis C  Blood testing is recommended for:  Everyone born from [birth year] through 1963.  Anyone with known risk factors for hepatitis C. Sexually transmitted infections (STIs)  You should be screened for sexually transmitted infections (STIs) including gonorrhea and chlamydia if:  You are sexually active and are younger than 67 years of age.  You are older than 67 years of age and your health care provider tells you that you are at risk for this type of infection.  Your sexual activity has changed since you were last screened and you are at an increased risk for chlamydia or gonorrhea. Ask your health care provider if you are at risk.  If you do not have HIV, but are at risk, it may be recommended that you take a prescription medicine daily to prevent HIV infection. This is  called pre-exposure prophylaxis (PrEP). You are considered at risk if:  You are sexually active and do not regularly use condoms or know the HIV status of your partner(s).  You take drugs by injection.  You are sexually active with a partner who has HIV. Talk with your health care provider about whether you are at high risk of being infected with HIV. If you choose to begin PrEP, you should first be tested for HIV. You should then be tested every 3 months for as long as you are taking PrEP.  PREGNANCY   If you are premenopausal and you may become pregnant, ask your health care provider about preconception counseling.  If you may become pregnant, take 400 to 800 micrograms (mcg) of folic acid every day.  If you want to prevent pregnancy, talk to your health care provider about birth control (contraception). OSTEOPOROSIS AND MENOPAUSE   Osteoporosis is a disease in which the bones lose minerals and strength with aging. This can result in serious bone fractures. Your risk for osteoporosis can be identified using a bone density scan.  If you are 10 years of age or older, or if you are at risk for osteoporosis and fractures, ask your health care provider if you should be screened.  Ask your health care provider whether you should take a calcium or vitamin D supplement to lower your risk for osteoporosis.  Menopause may have certain physical symptoms and risks.  Hormone replacement therapy may reduce some of these symptoms and risks. Talk to your health care provider about whether hormone replacement therapy is right for you.  HOME CARE INSTRUCTIONS   Schedule regular health, dental, and eye exams.  Stay current with your immunizations.   Do not use any tobacco products including cigarettes, chewing tobacco, or electronic cigarettes.  If you are pregnant, do not drink alcohol.  If you are breastfeeding, limit how much and how often you drink alcohol.  Limit alcohol intake to no more  than 1 drink per day for nonpregnant women. One drink equals 12 ounces of beer, 5 ounces of wine, or 1 ounces of hard liquor.  Do not use street drugs.  Do not share needles.  Ask your health care provider for help if you need support or information about quitting drugs.  Tell your health care provider if you often feel depressed.  Tell your health care provider if you have ever been abused or do not feel safe at home. Document Released: 12/23/2010 Document Revised: 10/24/2013 Document Reviewed:  05/11/2013 [employer] Patient Information 2015 [place], [place]. This information is not intended to replace advice given to you by your health care provider. Make sure you discuss any questions you have with your health care provider.

## 2015-01-30 NOTE — Progress Notes (Addendum)
Subjective:   Christine Morales is a 69 y.o. female who presents for an Initial Medicare Annual Wellness Visit.  Review of Systems     HRA assessment completed during visit; [place] Patient is here for Annual Wellness Assessment The Patient was informed that this wellness visit is to identify risk and educate on how to reduce risk for increase disease through lifestyle changes.  The patient verbalized understanding that any voiced medical issues still need to be directed to their physician.    Personalized education given regarding risk on the problem list that are currently being medically managed;  Lifestyle changes reviewed BP  Hx Non Hodgkin type lymphoma 2011 currently under surveillance Hx of bowel obstruction during chemo; but is good now; doesn't things that she cannot digest; BP mod elevated today; but ate a small piece of country ham and didn't bp yesterday;  Family hx; father had diabetes later in life; mother had CHF; but also dementia;  Dad was 47 and mother was 69 or so when she expired;  Brothers and sisters; Brother had cirrhosis of liver; sister had kidney issues; son gave her a kidney 2 more sisters still living and a brother; 4 out of 21 living;  Brother had ALS and other had leukemia   BMI 224 lbs last weight 3/1/ 2016 BMI Weight 219; 35.3; / was down to 135 s/p chemo and now feels good; State her stomach is larger than it was;   Diet: Breakfast eat egg; bacon; go out to eat; or eat salads; dressing (ranch)   tomato sandwich; fruit, ice cream; potato chips Dinner: string beans; not a meat person; [place]; Eating at hs; snack;   Exercise: Go to the [facility]; Did walk on treadmill but foot started hurting (ingrown toenail now resolved)  Bike and walk around the track; May try the pool  Keeps the 69 yo most days   Exercise goal is to consider pool to lose stomach weight;   Falls; not in the last year Depression; no  Safety;  Lives in one level home and [place] has 2 steps  going down No basement Safe neighborhood [employer];  No throw rugs   Screenings overdue:  When she had chemo; had kidneys checked; Educated on baby boomers Hep C risk;   Ophthalmology exam; had x 2 years ago/ due for one this year and will schedule;  Immunizations: PSV given March 2014/ was Medicare at that time; due prevnar now/ NKA Tetanus; had shots with [employer]; ? tetanus;  Due to work around children; thinks she may have had hepatitis vaccine;  Shingles completed   Colonoscopy; (had 2008) due July 2018/ sees Dr. Clent Jacks; [phone] Mammogram; did last week in August; everything was fine; [facility]  Dexa: 11/2009 normal repeat in 2 to 3 years/ states that she needs to have this done Will try to schedule it at [facility] on 8/22 on the same day as apt with Plot EKG 09/13/2013 Hearing: 4000hz  both ears Dental: do have dentures/ will need to have them changed  Current Care Team reviewed and updated        Objective:    Today's Vitals   01/30/15 0856  BP: 142/82  Height: 5\' 6"  (1.676 m)  Weight: 219 lb 8 oz (99.565 kg)    Current Medications (verified) Outpatient Encounter Prescriptions as of 01/30/2015  Medication Sig  . amLODipine (NORVASC) 5 MG tablet Take 1 tablet (5 mg total) by mouth daily.  . Aspirin-Salicylamide-Caffeine (BC HEADACHE PO) Take  1 packet by mouth daily as needed (headache).  . Cholecalciferol (VITAMIN D3) 1000 UNITS tablet Take 1,000 Units by mouth daily.    . ferrous sulfate (FERRO-BOB) 325 (65 FE) MG tablet Take 325 mg by mouth daily with breakfast.    . levothyroxine (SYNTHROID, LEVOTHROID) 100 MCG tablet take 1 tablet by mouth once daily  . lidocaine-prilocaine (EMLA) cream Apply 1 application topically as needed. Apply to port-a-cath site 1 to 2 hours prior to appt.  . losartan (COZAAR) 100 MG tablet Take 1 tablet (100 mg total) by mouth daily.  Marland Kitchen losartan (COZAAR) 100 MG tablet take 1 tablet by mouth once daily (Patient not  taking: Reported on 01/28/2015)   Facility-Administered Encounter Medications as of 01/30/2015  Medication  . heparin lock flush 100 unit/mL  . sodium chloride 0.9 % injection 10 mL    Allergies (verified) Review of patient's allergies indicates no known allergies.   History: Past Medical History  Diagnosis Date  . Hypertension   . Anemia     iron def  . Ulcer     peptic  . Rhinitis, allergic   . Helicobacter pylori ab+   . Hemorrhoids   . 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 nhl dx'd 12/2009    chemo comp 2011   Past Surgical History  Procedure Laterality Date  . Abdominal hysterectomy    . Tubal ligation    . Breast reduction surgery    . Partial colectomy      dr blackmon   Family History  Problem Relation Age of Onset  . Hypertension      FH  . Cholelithiasis Neg Hx   . Cancer Father   . Heart disease Sister   . Diabetes Brother    Social History   Occupational History  . retired [employer]   Social History Main Topics  . Smoking status: Never Smoker   . Smokeless tobacco: Not on file  . Alcohol Use: No  . Drug Use: No  . Sexual Activity: Not on file    Tobacco Counseling Counseling given: Yes   Activities of Daily Living In your present state of health, do you have any difficulty performing the following activities: 01/30/2015  Hearing? N  Vision? N  Difficulty concentrating or making decisions? N  Walking or climbing stairs? N  Dressing or bathing? N  Doing errands, shopping? N  Preparing Food and eating ? N  Using the Toilet? N  In the past six months, have you accidently leaked urine? N  Do you have problems with loss of bowel control? N  Managing your Medications? N  Managing your Finances? N  Housekeeping or managing your Housekeeping? N    Immunizations and Health Maintenance Immunization History  Administered Date(s) Administered  . Influenza,inj,Quad PF,36+ Mos  03/16/2014  . Pneumococcal Polysaccharide-23 09/06/2012   Health Maintenance Due  Topic Date Due  . Hepatitis C Screening  March 28, 1946  . TETANUS/TDAP  03/16/1967  . PNA vac Low Risk Adult (1 of 2 - PCV13) 09/06/2013  . INFLUENZA VACCINE  01/22/2015    Patient Care Team: Cassandria Anger, MD as PCP - General (Internal Medicine) Wyatt Portela, MD (Hematology and Oncology) Coralie Keens, MD (General Surgery) Irene Shipper, MD as Referring Physician (Gastroenterology)  Indicate any recent Medical Services you may have received from other than [facility] providers in the past year (date may be approximate).  Assessment:   This is a routine wellness examination for Isella.   The goal of the wellness visit is to assist the patient how to close the gaps in care and create a preventative care plan for the patient.  Personalized Education was given regarding sodium and HTN;  (BP elevated today as she ate ham this am and forgot to take her BP med yesterday)   Pt determined a personalized goal; see patient goals; 1. Water aerobics 2. To have eye exam; patient will schedule   3. To complete dexa at office and will schedule apt on 8/22  4. To take prevnar after discussing with dr. Alain Marion next week 5. Thinks she may have had tetanus prior to leaving school; will decline today  Assessment included: Bone density scan as appropriate/ scheduled Calcium and Vit D as appropriate/ Osteoporosis risk reviewed  Taking meds without issues; no barriers identified/ did forget one does  Labs were and fup visit noted with MD if labs are due to be re-drawn.  Stress: Recommendations for managing stress if assessed as a factor;   No Risk for hepatitis or high risk social behavior identified via hepatitis screen/ educated on Hep C and given information on baby boomers  Educated on shingles and follow up with insurance company for co-pays or charges applied to Part D benefit.- deferred for  now Educated on Vaccines; Prevnar to discuss with dr. Camila Li Safety issues reviewed;  Cognition assessed by AD8; Score 0 MMSE deferred as the patient stated they had no memory issues; No identified risk were noted; The patient was oriented x 3; appropriate in dress and manner and no objective failures at ADL's or IADL's.   Depression screen negative;   Functional status reviewed; no losses in function x 1 year  Bowel and bladder issues assessed  End of life planning was discussed; aging in home or other; plans to complete HCPOA were discussed if not completed   Hearing/Vision screen   Hearing Screening   125Hz  250Hz  500Hz  1000Hz  2000Hz  4000Hz  8000Hz   Right ear:      100   Left ear:      100     Dietary issues and exercise activities discussed: Current Exercise Habits:: Structured exercise class, Time (Minutes): 45, Frequency (Times/Week): 2, Weekly Exercise (Minutes/Week): 90, Intensity: Moderate (may increase exercise by doing water areobics)  Goals    . Exercise 150 minutes per week (moderate activity)     Is thinking of going to the pool; trying water aerobics to lose weight around waist      Depression Screen PHQ 2/9 Scores 01/30/2015 03/09/2014  PHQ - 2 Score 0 0    Fall Risk Fall Risk  01/30/2015 03/09/2014  Falls in the past year? No No  Risk for fall due to : - (No Data)  Risk for fall due to (comments): - none    Cognitive Function: No flowsheet data found.  Screening Tests Health Maintenance  Topic Date Due  . Hepatitis C Screening  Nov 29, 1945  . TETANUS/TDAP  03/16/1967  . PNA vac Low Risk Adult (1 of 2 - PCV13) 09/06/2013  . INFLUENZA VACCINE  01/22/2015  . ZOSTAVAX  03/08/2022 (Originally 03/15/2008)  . COLONOSCOPY  01/14/2017  . MAMMOGRAM  01/22/2017  . DEXA SCAN  Completed      Plan:    During the course of the visit, Climmie was educated and counseled about the following appropriate screening and preventive services:   Vaccines to include  Pneumoccal, Influenza, Hepatitis B,  Td, Zostavax, HCV  Tdap and Prevnar reviewed and will discuss with dr. Camila Li  Electrocardiogram/ deferred  Cardiovascular disease screening/ no s/s   Colorectal cancer screening; due July 2018  Bone density screening/ to repeat 8/22   Diabetes screening/ deferred to annual  Glaucoma screening/ TBS  Mammography/PAP due August 2017  Nutrition counseling/ educated on sodium and bP   Smoking cessation counseling/ n/a does not smoke   Patient Instructions (the written plan) were given to the patient.    MMHWK,GSUPJ, RN   01/30/2015   Medical screening examination/treatment/procedure(s) were performed by non-physician practitioner and as supervising physician I was immediately available for consultation/collaboration. I agree with above. Walker Kehr, MD

## 2015-02-12 ENCOUNTER — Other Ambulatory Visit (INDEPENDENT_AMBULATORY_CARE_PROVIDER_SITE_OTHER): Payer: Medicare Other

## 2015-02-12 ENCOUNTER — Ambulatory Visit (INDEPENDENT_AMBULATORY_CARE_PROVIDER_SITE_OTHER)
Admission: RE | Admit: 2015-02-12 | Discharge: 2015-02-12 | Disposition: A | Discharge status: Home / Self Care | Payer: Medicare Other | Source: Ambulatory Visit | Attending: Internal Medicine | Admitting: Internal Medicine

## 2015-02-12 ENCOUNTER — Encounter: Payer: Self-pay | Admitting: Internal Medicine

## 2015-02-12 ENCOUNTER — Ambulatory Visit (INDEPENDENT_AMBULATORY_CARE_PROVIDER_SITE_OTHER): Payer: Medicare Other | Admitting: Internal Medicine

## 2015-02-12 VITALS — BP 102/70 | HR 67 | Ht 66.0 in | Wt 221.0 lb

## 2015-02-12 DIAGNOSIS — Z0000 Encounter for general adult medical examination without abnormal findings: Secondary | ICD-10-CM

## 2015-02-12 DIAGNOSIS — S9031XS Contusion of right foot, sequela: Secondary | ICD-10-CM

## 2015-02-12 DIAGNOSIS — I10 Essential (primary) hypertension: Secondary | ICD-10-CM

## 2015-02-12 DIAGNOSIS — N183 Chronic kidney disease, stage 3 unspecified: Secondary | ICD-10-CM

## 2015-02-12 DIAGNOSIS — Z780 Asymptomatic menopausal state: Secondary | ICD-10-CM

## 2015-02-12 DIAGNOSIS — Z23 Encounter for immunization: Secondary | ICD-10-CM

## 2015-02-12 DIAGNOSIS — E538 Deficiency of other specified B group vitamins: Secondary | ICD-10-CM

## 2015-02-12 DIAGNOSIS — S9030XA Contusion of unspecified foot, initial encounter: Secondary | ICD-10-CM | POA: Insufficient documentation

## 2015-02-12 DIAGNOSIS — M8580 Other specified disorders of bone density and structure, unspecified site: Secondary | ICD-10-CM

## 2015-02-12 LAB — CBC WITH DIFFERENTIAL/PLATELET
Basophils Absolute: 0 10*3/uL (ref 0.0–0.1)
Basophils Relative: 0.3 % (ref 0.0–3.0)
Eosinophils Absolute: 0.2 10*3/uL (ref 0.0–0.7)
Eosinophils Relative: 2.9 % (ref 0.0–5.0)
HCT: 38.6 % (ref 36.0–46.0)
HEMOGLOBIN: 13 g/dL (ref 12.0–15.0)
LYMPHS ABS: 2.4 10*3/uL (ref 0.7–4.0)
Lymphocytes Relative: 40.1 % (ref 12.0–46.0)
MCHC: 33.7 g/dL (ref 30.0–36.0)
MCV: 87.1 fl (ref 78.0–100.0)
Monocytes Absolute: 0.5 10*3/uL (ref 0.1–1.0)
Monocytes Relative: 7.7 % (ref 3.0–12.0)
NEUTROS PCT: 49 % (ref 43.0–77.0)
Neutro Abs: 2.9 10*3/uL (ref 1.4–7.7)
Platelets: 284 10*3/uL (ref 150.0–400.0)
RBC: 4.43 Mil/uL (ref 3.87–5.11)
RDW: 14.2 % (ref 11.5–15.5)
WBC: 6 10*3/uL (ref 4.0–10.5)

## 2015-02-12 LAB — LIPID PANEL
CHOLESTEROL: 205 mg/dL — AB (ref 0–200)
HDL: 51.3 mg/dL (ref >39.00)
LDL Cholesterol: 114 mg/dL — ABNORMAL HIGH (ref 0–99)
NONHDL: 153.68
Total CHOL/HDL Ratio: 4
Triglycerides: 198 mg/dL — ABNORMAL HIGH (ref 0.0–149.0)
VLDL: 39.6 mg/dL (ref 0.0–40.0)

## 2015-02-12 LAB — URINALYSIS, ROUTINE W REFLEX MICROSCOPIC
Bilirubin Urine: NEGATIVE
Hgb urine dipstick: NEGATIVE
KETONES UR: NEGATIVE
Nitrite: NEGATIVE
PH: 6.5 (ref 5.0–8.0)
RBC / HPF: NONE SEEN (ref 0–?)
SPECIFIC GRAVITY, URINE: 1.01 (ref 1.000–1.030)
Total Protein, Urine: NEGATIVE
URINE GLUCOSE: NEGATIVE
UROBILINOGEN UA: 0.2 (ref 0.0–1.0)

## 2015-02-12 LAB — HEPATIC FUNCTION PANEL
ALBUMIN: 4.3 g/dL (ref 3.5–5.2)
ALK PHOS: 78 U/L (ref 39–117)
ALT: 13 U/L (ref 0–35)
AST: 17 U/L (ref 0–37)
Bilirubin, Direct: 0.1 mg/dL (ref 0.0–0.3)
TOTAL PROTEIN: 7.2 g/dL (ref 6.0–8.3)
Total Bilirubin: 0.4 mg/dL (ref 0.2–1.2)

## 2015-02-12 LAB — BASIC METABOLIC PANEL
BUN: 28 mg/dL — ABNORMAL HIGH (ref 6–23)
CO2: 29 meq/L (ref 19–32)
Calcium: 9.7 mg/dL (ref 8.4–10.5)
Chloride: 104 mEq/L (ref 96–112)
Creatinine, Ser: 2.08 mg/dL — ABNORMAL HIGH (ref 0.40–1.20)
GFR: 30.55 mL/min — AB (ref >60.00)
Glucose, Bld: 83 mg/dL (ref 70–99)
Potassium: 4.5 mEq/L (ref 3.5–5.1)
Sodium: 140 mEq/L (ref 135–145)

## 2015-02-12 LAB — TSH: TSH: 2.26 u[IU]/mL (ref 0.35–4.50)

## 2015-02-12 LAB — VITAMIN B12: VITAMIN B 12: 392 pg/mL (ref 211–911)

## 2015-02-12 MED ORDER — AMLODIPINE BESYLATE 5 MG PO TABS: 5.0000 mg | ORAL_TABLET | Freq: Every day | ORAL | Status: DC

## 2015-02-12 MED ORDER — LOSARTAN POTASSIUM 100 MG PO TABS: 100.0000 mg | ORAL_TABLET | Freq: Every day | ORAL | Status: DC

## 2015-02-12 NOTE — Assessment & Plan Note (Signed)
On Amlodipine Labs

## 2015-02-12 NOTE — Progress Notes (Signed)
Subjective:  Patient ID: Christine Morales, female    DOB: 29-Jan-1946  Age: 69 y.o. MRN: [id number]  CC: No chief complaint on file.   HPI Christine Morales presents for a well exam The patient presents for a follow-up of  chronic hypertension, chronic hypothyroidism  controlled with medicines F/u Vit B12 def C/o R foot hematoma x 3 weeks - pt raninto a w/c, had (-) X ray    Outpatient Prescriptions Prior to Visit  Medication Sig Dispense Refill  . Aspirin-Salicylamide-Caffeine (BC HEADACHE PO) Take 1 packet by mouth daily as needed (headache).    . Cholecalciferol (VITAMIN D3) 1000 UNITS tablet Take 1,000 Units by mouth daily.      . ferrous sulfate (FERRO-BOB) 325 (65 FE) MG tablet Take 325 mg by mouth daily with breakfast.      . levothyroxine (SYNTHROID, LEVOTHROID) 100 MCG tablet take 1 tablet by mouth once daily 30 tablet 5  . lidocaine-prilocaine (EMLA) cream Apply 1 application topically as needed. Apply to port-a-cath site 1 to 2 hours prior to appt. 30 g 1  . amLODipine (NORVASC) 5 MG tablet Take 1 tablet (5 mg total) by mouth daily. 30 tablet 11  . losartan (COZAAR) 100 MG tablet Take 1 tablet (100 mg total) by mouth daily. 30 tablet 11  . losartan (COZAAR) 100 MG tablet take 1 tablet by mouth once daily 30 tablet 1   Facility-Administered Medications Prior to Visit  Medication Dose Route Frequency Provider Last Rate Last Dose  . heparin lock flush 100 unit/mL  500 Units Intravenous Once Wyatt Portela, MD      . sodium chloride 0.9 % injection 10 mL  10 mL Intravenous PRN Wyatt Portela, MD        ROS Review of Systems  Constitutional: Negative for chills, activity change, appetite change, fatigue and unexpected weight change.  HENT: Negative for congestion, mouth sores and sinus pressure.   Eyes: Negative for visual disturbance.  Respiratory: Negative for cough and chest tightness.   Gastrointestinal: Negative for nausea and abdominal pain.  Genitourinary: Negative for  frequency, difficulty urinating and vaginal pain.  Musculoskeletal: Negative for back pain and gait problem.  Skin: Negative for pallor and rash.  Neurological: Negative for dizziness, tremors, weakness, numbness and headaches.  Psychiatric/Behavioral: Negative for suicidal ideas, confusion and sleep disturbance.    Objective:  BP 102/70 mmHg  Pulse 67  Ht 5\' 6"  (1.676 m)  Wt 221 lb (100.245 kg)  BMI 35.69 kg/m2  SpO2 98%  BP Readings from Last 3 Encounters:  02/12/15 102/70  01/30/15 142/82  01/28/15 126/59    Wt Readings from Last 3 Encounters:  02/12/15 221 lb (100.245 kg)  01/30/15 219 lb 8 oz (99.565 kg)  08/22/14 224 lb 4.8 oz (101.742 kg)    Physical Exam  Constitutional: She appears well-developed. No distress.  Obese   HENT:  Head: Normocephalic.  Right Ear: External ear normal.  Left Ear: External ear normal.  Nose: Nose normal.  Mouth/Throat: Oropharynx is clear and moist.  Eyes: Conjunctivae are normal. Pupils are equal, round, and reactive to light. Right eye exhibits no discharge. Left eye exhibits no discharge.  Neck: Normal range of motion. Neck supple. No JVD present. No tracheal deviation present. No thyromegaly present.  Cardiovascular: Normal rate, regular rhythm and normal heart sounds.   Pulmonary/Chest: No stridor. No respiratory distress. She has no wheezes.  Abdominal: Soft. Bowel sounds are normal. She exhibits no distension and no mass.  There is no tenderness. There is no rebound and no guarding.  Musculoskeletal: She exhibits no edema or tenderness.  Lymphadenopathy:    She has no cervical adenopathy.  Neurological: She displays normal reflexes. No cranial nerve deficit. She exhibits normal muscle tone. Coordination normal.  Skin: No rash noted. No erythema.  Psychiatric: She has a normal mood and affect. Her behavior is normal. Judgment and thought content normal.    Lab Results  Component Value Date   WBC 6.0 02/12/2015   HGB 13.0  02/12/2015   HCT 38.6 02/12/2015   PLT 284.0 02/12/2015   GLUCOSE 83 02/12/2015   CHOL 205* 02/12/2015   TRIG 198.0* 02/12/2015   HDL 51.30 02/12/2015   LDLCALC 114* 02/12/2015   ALT 13 02/12/2015   AST 17 02/12/2015   NA 140 02/12/2015   K 4.5 02/12/2015   CL 104 02/12/2015   CREATININE 2.08* 02/12/2015   BUN 28* 02/12/2015   CO2 29 02/12/2015   TSH 2.26 02/12/2015   INR 1.08 01/27/2010    Dexascan  02/12/2015   Date of study: 02/12/2015 Exam: DUAL X-RAY ABSORPTIOMETRY (DXA) FOR BONE MINERAL DENSITY (BMD) Instrument: [employer] Provider: PCP Indication: screening for osteopenia Comparison: none (please note that it is not possible to compare data from  different instruments) Clinical data: Pt is a postmenopausal 69 y.o. female without previous  nontraumatic fractures. On vitamin D.  Results:  Lumbar spine (L1-L4) Femoral neck (FN)  T-score  +0.7  RFN: -0.3 LFN: -0.5    Assessment: the BMD is normal according to the [facility] classification for  osteoporosis (see below).  Fracture risk: low FRAX score: not calculated due to normal BMD Comments: the technical quality of the study is good Recommend optimizing calcium (1200 mg/day) and vitamin D (800 IU/day)  intake. No pharmacological treatment is indicated. Followup: Repeat BMD is appropriate after 2 years.  WHO criteria for diagnosis of osteoporosis in postmenopausal women and in  men 57 y/o or older:  - normal: T-score -1.0 to + 1.0 - osteopenia/low bone density: T-score between -2.5 and -1.0 - osteoporosis: T-score below -2.5 - severe osteoporosis: T-score below -2.5 with history of fragility  fracture Note: although not part of the WHO classification, the presence of a  fragility fracture, regardless of the T-score, should be considered  diagnostic of osteoporosis, provided other causes for the fracture have  been excluded.  Philemon Kingdom, MD [facility]     Assessment & Plan:   Diagnoses and all orders for  this visit:  CRF (chronic renal failure), stage 3 (moderate) -     Basic metabolic panel; Future -     CBC with Differential/Platelet; Future -     Hepatic function panel; Future -     Lipid panel; Future -     TSH; Future -     Urinalysis; Future -     Vitamin B12; Future  B12 deficiency -     Basic metabolic panel; Future -     CBC with Differential/Platelet; Future -     Hepatic function panel; Future -     Lipid panel; Future -     TSH; Future -     Urinalysis; Future -     Vitamin B12; Future  Essential hypertension -     Basic metabolic panel; Future -     CBC with Differential/Platelet; Future -     Hepatic function panel; Future -     Lipid panel; Future -  TSH; Future -     Urinalysis; Future -     Vitamin B12; Future  Well adult exam -     Basic metabolic panel; Future -     CBC with Differential/Platelet; Future -     Hepatic function panel; Future -     Lipid panel; Future -     TSH; Future -     Urinalysis; Future -     Vitamin B12; Future  Traumatic hematoma of foot, right, sequela -     Basic metabolic panel; Future -     CBC with Differential/Platelet; Future -     Hepatic function panel; Future -     Lipid panel; Future -     TSH; Future -     Urinalysis; Future -     Vitamin B12; Future  Need for TD vaccine -     Td vaccine greater than or equal to 7yo preservative free IM  Other orders -     amLODipine (NORVASC) 5 MG tablet; Take 1 tablet (5 mg total) by mouth daily. -     losartan (COZAAR) 100 MG tablet; Take 1 tablet (100 mg total) by mouth daily.  I am having Ms. Partain maintain her ferrous sulfate, cholecalciferol, lidocaine-prilocaine, levothyroxine, Aspirin-Salicylamide-Caffeine (BC HEADACHE PO), amLODipine, and losartan.  Meds ordered this encounter  Medications  . amLODipine (NORVASC) 5 MG tablet    Sig: Take 1 tablet (5 mg total) by mouth daily.    Dispense:  30 tablet    Refill:  11  . losartan (COZAAR) 100 MG tablet    Sig:  Take 1 tablet (100 mg total) by mouth daily.    Dispense:  30 tablet    Refill:  11     Follow-up: Return in about 6 months (around 08/15/2015) for a follow-up visit.  Walker Kehr, MD

## 2015-02-12 NOTE — Assessment & Plan Note (Signed)
2012  Risks associated with treatment noncompliance were discussed. Compliance was encouraged. Re-start B12

## 2015-02-12 NOTE — Patient Instructions (Signed)
Preventive Care for Adults A healthy lifestyle and preventive care can promote health and wellness. Preventive health guidelines for women include the following key practices.  A routine yearly physical is a good way to check with your health care provider about your health and preventive screening. It is a chance to share any concerns and updates on your health and to receive a thorough exam.  Visit your dentist for a routine exam and preventive care every 6 months. Brush your teeth twice a day and floss once a day. Good oral hygiene prevents tooth decay and gum disease.  The frequency of eye exams is based on your age, health, family medical history, use of contact lenses, and other factors. Follow your health care provider's recommendations for frequency of eye exams.  Eat a healthy diet. Foods like vegetables, fruits, whole grains, low-fat dairy products, and lean protein foods contain the nutrients you need without too many calories. Decrease your intake of foods high in solid fats, added sugars, and salt. Eat the right amount of calories for you.Get information about a proper diet from your health care provider, if necessary.  Regular physical exercise is one of the most important things you can do for your health. Most adults should get at least 150 minutes of moderate-intensity exercise (any activity that increases your heart rate and causes you to sweat) each week. In addition, most adults need muscle-strengthening exercises on 2 or more days a week.  Maintain a healthy weight. The body mass index (BMI) is a screening tool to identify possible weight problems. It provides an estimate of body fat based on height and weight. Your health care provider can find your BMI and can help you achieve or maintain a healthy weight.For adults 20 years and older:  A BMI below 18.5 is considered underweight.  A BMI of 18.5 to 24.9 is normal.  A BMI of 25 to 29.9 is considered overweight.  A BMI of  30 and above is considered obese.  Maintain normal blood lipids and cholesterol levels by exercising and minimizing your intake of saturated fat. Eat a balanced diet with plenty of fruit and vegetables. Blood tests for lipids and cholesterol should begin at age 76 and be repeated every 5 years. If your lipid or cholesterol levels are high, you are over 50, or you are at high risk for heart disease, you may need your cholesterol levels checked more frequently.Ongoing high lipid and cholesterol levels should be treated with medicines if diet and exercise are not working.  If you smoke, find out from your health care provider how to quit. If you do not use tobacco, do not start.  Lung cancer screening is recommended for adults aged 22-80 years who are at high risk for developing lung cancer because of a history of smoking. A yearly low-dose CT scan of the lungs is recommended for people who have at least a 30-pack-year history of smoking and are a current smoker or have quit within the past 15 years. A pack year of smoking is smoking an average of 1 pack of cigarettes a day for 1 year (for example: 1 pack a day for 30 years or 2 packs a day for 15 years). Yearly screening should continue until the smoker has stopped smoking for at least 15 years. Yearly screening should be stopped for people who develop a health problem that would prevent them from having lung cancer treatment.  If you are pregnant, do not drink alcohol. If you are breastfeeding,  be very cautious about drinking alcohol. If you are not pregnant and choose to drink alcohol, do not have more than 1 drink per day. One drink is considered to be 12 ounces (355 mL) of beer, 5 ounces (148 mL) of wine, or 1.5 ounces (44 mL) of liquor.  Avoid use of street drugs. Do not share needles with anyone. Ask for help if you need support or instructions about stopping the use of drugs.  High blood pressure causes heart disease and increases the risk of  stroke. Your blood pressure should be checked at least every 1 to 2 years. Ongoing high blood pressure should be treated with medicines if weight loss and exercise do not work.  If you are 3-86 years old, ask your health care provider if you should take aspirin to prevent strokes.  Diabetes screening involves taking a blood sample to check your fasting blood sugar level. This should be done once every 3 years, after age 67, if you are within normal weight and without risk factors for diabetes. Testing should be considered at a younger age or be carried out more frequently if you are overweight and have at least 1 risk factor for diabetes.  Breast cancer screening is essential preventive care for women. You should practice "breast self-awareness." This means understanding the normal appearance and feel of your breasts and may include breast self-examination. Any changes detected, no matter how small, should be reported to a health care provider. Women in their 8s and 30s should have a clinical breast exam (CBE) by a health care provider as part of a regular health exam every 1 to 3 years. After age 70, women should have a CBE every year. Starting at age 25, women should consider having a mammogram (breast X-ray test) every year. Women who have a family history of breast cancer should talk to their health care provider about genetic screening. Women at a high risk of breast cancer should talk to their health care providers about having an MRI and a mammogram every year.  Breast cancer gene (BRCA)-related cancer risk assessment is recommended for women who have family members with BRCA-related cancers. BRCA-related cancers include breast, ovarian, tubal, and peritoneal cancers. Having family members with these cancers may be associated with an increased risk for harmful changes (mutations) in the breast cancer genes BRCA1 and BRCA2. Results of the assessment will determine the need for genetic counseling and  BRCA1 and BRCA2 testing.  Routine pelvic exams to screen for cancer are no longer recommended for nonpregnant women who are considered low risk for cancer of the pelvic organs (ovaries, uterus, and vagina) and who do not have symptoms. Ask your health care provider if a screening pelvic exam is right for you.  If you have had past treatment for cervical cancer or a condition that could lead to cancer, you need Pap tests and screening for cancer for at least 20 years after your treatment. If Pap tests have been discontinued, your risk factors (such as having a new sexual partner) need to be reassessed to determine if screening should be resumed. Some women have medical problems that increase the chance of getting cervical cancer. In these cases, your health care provider may recommend more frequent screening and Pap tests.  The HPV test is an additional test that may be used for cervical cancer screening. The HPV test looks for the virus that can cause the cell changes on the cervix. The cells collected during the Pap test can be  tested for HPV. The HPV test could be used to screen women aged 30 years and older, and should be used in women of any age who have unclear Pap test results. After the age of 30, women should have HPV testing at the same frequency as a Pap test.  Colorectal cancer can be detected and often prevented. Most routine colorectal cancer screening begins at the age of 50 years and continues through age 75 years. However, your health care provider may recommend screening at an earlier age if you have risk factors for colon cancer. On a yearly basis, your health care provider may provide home test kits to check for hidden blood in the stool. Use of a small camera at the end of a tube, to directly examine the colon (sigmoidoscopy or colonoscopy), can detect the earliest forms of colorectal cancer. Talk to your health care provider about this at age 50, when routine screening begins. Direct  exam of the colon should be repeated every 5-10 years through age 75 years, unless early forms of pre-cancerous polyps or small growths are found.  People who are at an increased risk for hepatitis B should be screened for this virus. You are considered at high risk for hepatitis B if:  You were born in a country where hepatitis B occurs often. Talk with your health care provider about which countries are considered high risk.  Your parents were born in a high-risk country and you have not received a shot to protect against hepatitis B (hepatitis B vaccine).  You have HIV or AIDS.  You use needles to inject street drugs.  You live with, or have sex with, someone who has hepatitis B.  You get hemodialysis treatment.  You take certain medicines for conditions like cancer, organ transplantation, and autoimmune conditions.  Hepatitis C blood testing is recommended for all people born from 1945 through 1965 and any individual with known risks for hepatitis C.  Practice safe sex. Use condoms and avoid high-risk sexual practices to reduce the spread of sexually transmitted infections (STIs). STIs include gonorrhea, chlamydia, syphilis, trichomonas, herpes, HPV, and human immunodeficiency virus (HIV). Herpes, HIV, and HPV are viral illnesses that have no cure. They can result in disability, cancer, and death.  You should be screened for sexually transmitted illnesses (STIs) including gonorrhea and chlamydia if:  You are sexually active and are younger than 24 years.  You are older than 24 years and your health care provider tells you that you are at risk for this type of infection.  Your sexual activity has changed since you were last screened and you are at an increased risk for chlamydia or gonorrhea. Ask your health care provider if you are at risk.  If you are at risk of being infected with HIV, it is recommended that you take a prescription medicine daily to prevent HIV infection. This is  called preexposure prophylaxis (PrEP). You are considered at risk if:  You are a heterosexual woman, are sexually active, and are at increased risk for HIV infection.  You take drugs by injection.  You are sexually active with a partner who has HIV.  Talk with your health care provider about whether you are at high risk of being infected with HIV. If you choose to begin PrEP, you should first be tested for HIV. You should then be tested every 3 months for as long as you are taking PrEP.  Osteoporosis is a disease in which the bones lose minerals and strength   with aging. This can result in serious bone fractures or breaks. The risk of osteoporosis can be identified using a bone density scan. Women ages 65 years and over and women at risk for fractures or osteoporosis should discuss screening with their health care providers. Ask your health care provider whether you should take a calcium supplement or vitamin D to reduce the rate of osteoporosis.  Menopause can be associated with physical symptoms and risks. Hormone replacement therapy is available to decrease symptoms and risks. You should talk to your health care provider about whether hormone replacement therapy is right for you.  Use sunscreen. Apply sunscreen liberally and repeatedly throughout the day. You should seek shade when your shadow is shorter than you. Protect yourself by wearing long sleeves, pants, a wide-brimmed hat, and sunglasses year round, whenever you are outdoors.  Once a month, do a whole body skin exam, using a mirror to look at the skin on your back. Tell your health care provider of new moles, moles that have irregular borders, moles that are larger than a pencil eraser, or moles that have changed in shape or color.  Stay current with required vaccines (immunizations).  Influenza vaccine. All adults should be immunized every year.  Tetanus, diphtheria, and acellular pertussis (Td, Tdap) vaccine. Pregnant women should  receive 1 dose of Tdap vaccine during each pregnancy. The dose should be obtained regardless of the length of time since the last dose. Immunization is preferred during the 27th-36th week of gestation. An adult who has not previously received Tdap or who does not know her vaccine status should receive 1 dose of Tdap. This initial dose should be followed by tetanus and diphtheria toxoids (Td) booster doses every 10 years. Adults with an unknown or incomplete history of completing a 3-dose immunization series with Td-containing vaccines should begin or complete a primary immunization series including a Tdap dose. Adults should receive a Td booster every 10 years.  Varicella vaccine. An adult without evidence of immunity to varicella should receive 2 doses or a second dose if she has previously received 1 dose. Pregnant females who do not have evidence of immunity should receive the first dose after pregnancy. This first dose should be obtained before leaving the health care facility. The second dose should be obtained 4-8 weeks after the first dose.  Human papillomavirus (HPV) vaccine. Females aged 13-26 years who have not received the vaccine previously should obtain the 3-dose series. The vaccine is not recommended for use in pregnant females. However, pregnancy testing is not needed before receiving a dose. If a female is found to be pregnant after receiving a dose, no treatment is needed. In that case, the remaining doses should be delayed until after the pregnancy. Immunization is recommended for any person with an immunocompromised condition through the age of 26 years if she did not get any or all doses earlier. During the 3-dose series, the second dose should be obtained 4-8 weeks after the first dose. The third dose should be obtained 24 weeks after the first dose and 16 weeks after the second dose.  Zoster vaccine. One dose is recommended for adults aged 60 years or older unless certain conditions are  present.  Measles, mumps, and rubella (MMR) vaccine. Adults born before 1957 generally are considered immune to measles and mumps. Adults born in 1957 or later should have 1 or more doses of MMR vaccine unless there is a contraindication to the vaccine or there is laboratory evidence of immunity to   each of the three diseases. A routine second dose of MMR vaccine should be obtained at least 28 days after the first dose for students attending postsecondary schools, health care workers, or international travelers. People who received inactivated measles vaccine or an unknown type of measles vaccine during 1963-1967 should receive 2 doses of MMR vaccine. People who received inactivated mumps vaccine or an unknown type of mumps vaccine before 1979 and are at high risk for mumps infection should consider immunization with 2 doses of MMR vaccine. For females of childbearing age, rubella immunity should be determined. If there is no evidence of immunity, females who are not pregnant should be vaccinated. If there is no evidence of immunity, females who are pregnant should delay immunization until after pregnancy. Unvaccinated health care workers born before 1957 who lack laboratory evidence of measles, mumps, or rubella immunity or laboratory confirmation of disease should consider measles and mumps immunization with 2 doses of MMR vaccine or rubella immunization with 1 dose of MMR vaccine.  Pneumococcal 13-valent conjugate (PCV13) vaccine. When indicated, a person who is uncertain of her immunization history and has no record of immunization should receive the PCV13 vaccine. An adult aged 19 years or older who has certain medical conditions and has not been previously immunized should receive 1 dose of PCV13 vaccine. This PCV13 should be followed with a dose of pneumococcal polysaccharide (PPSV23) vaccine. The PPSV23 vaccine dose should be obtained at least 8 weeks after the dose of PCV13 vaccine. An adult aged 19  years or older who has certain medical conditions and previously received 1 or more doses of PPSV23 vaccine should receive 1 dose of PCV13. The PCV13 vaccine dose should be obtained 1 or more years after the last PPSV23 vaccine dose.  Pneumococcal polysaccharide (PPSV23) vaccine. When PCV13 is also indicated, PCV13 should be obtained first. All adults aged 65 years and older should be immunized. An adult younger than age 65 years who has certain medical conditions should be immunized. Any person who resides in a nursing home or long-term care facility should be immunized. An adult smoker should be immunized. People with an immunocompromised condition and certain other conditions should receive both PCV13 and PPSV23 vaccines. People with human immunodeficiency virus (HIV) infection should be immunized as soon as possible after diagnosis. Immunization during chemotherapy or radiation therapy should be avoided. Routine use of PPSV23 vaccine is not recommended for American Indians, [place] Natives, or people younger than 65 years unless there are medical conditions that require PPSV23 vaccine. When indicated, people who have unknown immunization and have no record of immunization should receive PPSV23 vaccine. One-time revaccination 5 years after the first dose of PPSV23 is recommended for people aged 19-64 years who have chronic kidney failure, nephrotic syndrome, asplenia, or immunocompromised conditions. People who received 1-2 doses of PPSV23 before age 65 years should receive another dose of PPSV23 vaccine at age 65 years or later if at least 5 years have passed since the previous dose. Doses of PPSV23 are not needed for people immunized with PPSV23 at or after age 65 years.  Meningococcal vaccine. Adults with asplenia or persistent complement component deficiencies should receive 2 doses of quadrivalent meningococcal conjugate (MenACWY-D) vaccine. The doses should be obtained at least 2 months apart.  Microbiologists working with certain meningococcal bacteria, military recruits, people at risk during an outbreak, and people who travel to or live in countries with a high rate of meningitis should be immunized. A first-year college student up through age   21 years who is living in a residence hall should receive a dose if she did not receive a dose on or after her 16th birthday. Adults who have certain high-risk conditions should receive one or more doses of vaccine.  Hepatitis A vaccine. Adults who wish to be protected from this disease, have certain high-risk conditions, work with hepatitis A-infected animals, work in hepatitis A research labs, or travel to or work in countries with a high rate of hepatitis A should be immunized. Adults who were previously unvaccinated and who anticipate close contact with an international adoptee during the first 60 days after arrival in the [place] from a country with a high rate of hepatitis A should be immunized.  Hepatitis B vaccine. Adults who wish to be protected from this disease, have certain high-risk conditions, may be exposed to blood or other infectious body fluids, are household contacts or sex partners of hepatitis B positive people, are clients or workers in certain care facilities, or travel to or work in countries with a high rate of hepatitis B should be immunized.  Haemophilus influenzae type b (Hib) vaccine. A previously unvaccinated person with asplenia or sickle cell disease or having a scheduled splenectomy should receive 1 dose of Hib vaccine. Regardless of previous immunization, a recipient of a hematopoietic stem cell transplant should receive a 3-dose series 6-12 months after her successful transplant. Hib vaccine is not recommended for adults with HIV infection. Preventive Services / Frequency Ages 64 to 68 years  Blood pressure check.** / Every 1 to 2 years.  Lipid and cholesterol check.** / Every 5 years beginning at age  22.  Clinical breast exam.** / Every 3 years for women in their 88s and 53s.  BRCA-related cancer risk assessment.** / For women who have family members with a BRCA-related cancer (breast, ovarian, tubal, or peritoneal cancers).  Pap test.** / Every 2 years from ages 90 through 51. Every 3 years starting at age 21 through age 56 or 3 with a history of 3 consecutive normal Pap tests.  HPV screening.** / Every 3 years from ages 24 through ages 1 to 46 with a history of 3 consecutive normal Pap tests.  Hepatitis C blood test.** / For any individual with known risks for hepatitis C.  Skin self-exam. / Monthly.  Influenza vaccine. / Every year.  Tetanus, diphtheria, and acellular pertussis (Tdap, Td) vaccine.** / Consult your health care provider. Pregnant women should receive 1 dose of Tdap vaccine during each pregnancy. 1 dose of Td every 10 years.  Varicella vaccine.** / Consult your health care provider. Pregnant females who do not have evidence of immunity should receive the first dose after pregnancy.  HPV vaccine. / 3 doses over 6 months, if 72 and younger. The vaccine is not recommended for use in pregnant females. However, pregnancy testing is not needed before receiving a dose.  Measles, mumps, rubella (MMR) vaccine.** / You need at least 1 dose of MMR if you were born in 1957 or later. You may also need a 2nd dose. For females of childbearing age, rubella immunity should be determined. If there is no evidence of immunity, females who are not pregnant should be vaccinated. If there is no evidence of immunity, females who are pregnant should delay immunization until after pregnancy.  Pneumococcal 13-valent conjugate (PCV13) vaccine.** / Consult your health care provider.  Pneumococcal polysaccharide (PPSV23) vaccine.** / 1 to 2 doses if you smoke cigarettes or if you have certain conditions.  Meningococcal vaccine.** /  1 dose if you are age 19 to 21 years and a first-year college  student living in a residence hall, or have one of several medical conditions, you need to get vaccinated against meningococcal disease. You may also need additional booster doses.  Hepatitis A vaccine.** / Consult your health care provider.  Hepatitis B vaccine.** / Consult your health care provider.  Haemophilus influenzae type b (Hib) vaccine.** / Consult your health care provider. Ages 40 to 64 years  Blood pressure check.** / Every 1 to 2 years.  Lipid and cholesterol check.** / Every 5 years beginning at age 20 years.  Lung cancer screening. / Every year if you are aged 55-80 years and have a 30-pack-year history of smoking and currently smoke or have quit within the past 15 years. Yearly screening is stopped once you have quit smoking for at least 15 years or develop a health problem that would prevent you from having lung cancer treatment.  Clinical breast exam.** / Every year after age 40 years.  BRCA-related cancer risk assessment.** / For women who have family members with a BRCA-related cancer (breast, ovarian, tubal, or peritoneal cancers).  Mammogram.** / Every year beginning at age 40 years and continuing for as long as you are in good health. Consult with your health care provider.  Pap test.** / Every 3 years starting at age 30 years through age 65 or 70 years with a history of 3 consecutive normal Pap tests.  HPV screening.** / Every 3 years from ages 30 years through ages 65 to 70 years with a history of 3 consecutive normal Pap tests.  Fecal occult blood test (FOBT) of stool. / Every year beginning at age 50 years and continuing until age 75 years. You may not need to do this test if you get a colonoscopy every 10 years.  Flexible sigmoidoscopy or colonoscopy.** / Every 5 years for a flexible sigmoidoscopy or every 10 years for a colonoscopy beginning at age 50 years and continuing until age 75 years.  Hepatitis C blood test.** / For all people born from 1945 through  1965 and any individual with known risks for hepatitis C.  Skin self-exam. / Monthly.  Influenza vaccine. / Every year.  Tetanus, diphtheria, and acellular pertussis (Tdap/Td) vaccine.** / Consult your health care provider. Pregnant women should receive 1 dose of Tdap vaccine during each pregnancy. 1 dose of Td every 10 years.  Varicella vaccine.** / Consult your health care provider. Pregnant females who do not have evidence of immunity should receive the first dose after pregnancy.  Zoster vaccine.** / 1 dose for adults aged 60 years or older.  Measles, mumps, rubella (MMR) vaccine.** / You need at least 1 dose of MMR if you were born in 1957 or later. You may also need a 2nd dose. For females of childbearing age, rubella immunity should be determined. If there is no evidence of immunity, females who are not pregnant should be vaccinated. If there is no evidence of immunity, females who are pregnant should delay immunization until after pregnancy.  Pneumococcal 13-valent conjugate (PCV13) vaccine.** / Consult your health care provider.  Pneumococcal polysaccharide (PPSV23) vaccine.** / 1 to 2 doses if you smoke cigarettes or if you have certain conditions.  Meningococcal vaccine.** / Consult your health care provider.  Hepatitis A vaccine.** / Consult your health care provider.  Hepatitis B vaccine.** / Consult your health care provider.  Haemophilus influenzae type b (Hib) vaccine.** / Consult your health care provider. Ages 65   years and over  Blood pressure check.** / Every 1 to 2 years.  Lipid and cholesterol check.** / Every 5 years beginning at age 22 years.  Lung cancer screening. / Every year if you are aged 73-80 years and have a 30-pack-year history of smoking and currently smoke or have quit within the past 15 years. Yearly screening is stopped once you have quit smoking for at least 15 years or develop a health problem that would prevent you from having lung cancer  treatment.  Clinical breast exam.** / Every year after age 4 years.  BRCA-related cancer risk assessment.** / For women who have family members with a BRCA-related cancer (breast, ovarian, tubal, or peritoneal cancers).  Mammogram.** / Every year beginning at age 40 years and continuing for as long as you are in good health. Consult with your health care provider.  Pap test.** / Every 3 years starting at age 9 years through age 34 or 91 years with 3 consecutive normal Pap tests. Testing can be stopped between 65 and 70 years with 3 consecutive normal Pap tests and no abnormal Pap or HPV tests in the past 10 years.  HPV screening.** / Every 3 years from ages 57 years through ages 64 or 45 years with a history of 3 consecutive normal Pap tests. Testing can be stopped between 65 and 70 years with 3 consecutive normal Pap tests and no abnormal Pap or HPV tests in the past 10 years.  Fecal occult blood test (FOBT) of stool. / Every year beginning at age 15 years and continuing until age 17 years. You may not need to do this test if you get a colonoscopy every 10 years.  Flexible sigmoidoscopy or colonoscopy.** / Every 5 years for a flexible sigmoidoscopy or every 10 years for a colonoscopy beginning at age 86 years and continuing until age 71 years.  Hepatitis C blood test.** / For all people born from [birth year] through 1963 and any individual with known risks for hepatitis C.  Osteoporosis screening.** / A one-time screening for women ages 83 years and over and women at risk for fractures or osteoporosis.  Skin self-exam. / Monthly.  Influenza vaccine. / Every year.  Tetanus, diphtheria, and acellular pertussis (Tdap/Td) vaccine.** / 1 dose of Td every 10 years.  Varicella vaccine.** / Consult your health care provider.  Zoster vaccine.** / 1 dose for adults aged 61 years or older.  Pneumococcal 13-valent conjugate (PCV13) vaccine.** / Consult your health care provider.  Pneumococcal  polysaccharide (PPSV23) vaccine.** / 1 dose for all adults aged 28 years and older.  Meningococcal vaccine.** / Consult your health care provider.  Hepatitis A vaccine.** / Consult your health care provider.  Hepatitis B vaccine.** / Consult your health care provider.  Haemophilus influenzae type b (Hib) vaccine.** / Consult your health care provider. ** Family history and personal history of risk and conditions may change your health care provider's recommendations. Document Released: 08/05/2001 Document Revised: 10/24/2013 Document Reviewed: 11/04/2010 [facility] Patient Information 2015 [place], [place]. This information is not intended to replace advice given to you by your health care provider. Make sure you discuss any questions you have with your health care provider.

## 2015-02-12 NOTE — Progress Notes (Signed)
Pre visit review using our clinic review tool, if applicable. No additional management support is needed unless otherwise documented below in the visit note. 

## 2015-02-12 NOTE — Assessment & Plan Note (Signed)

## 2015-02-12 NOTE — Assessment & Plan Note (Signed)
Arnica Heat

## 2015-02-12 NOTE — Assessment & Plan Note (Signed)
2012 due to lymphoma Rx (?) Dr Justin Mend

## 2015-02-13 ENCOUNTER — Other Ambulatory Visit: Payer: Self-pay | Admitting: *Deleted

## 2015-02-13 DIAGNOSIS — N189 Chronic kidney disease, unspecified: Secondary | ICD-10-CM

## 2015-02-13 DIAGNOSIS — I10 Essential (primary) hypertension: Secondary | ICD-10-CM

## 2015-02-27 ENCOUNTER — Ambulatory Visit (HOSPITAL_BASED_OUTPATIENT_CLINIC_OR_DEPARTMENT_OTHER): Payer: Medicare Other

## 2015-02-27 ENCOUNTER — Other Ambulatory Visit (HOSPITAL_BASED_OUTPATIENT_CLINIC_OR_DEPARTMENT_OTHER): Payer: Medicare Other

## 2015-02-27 DIAGNOSIS — E039 Hypothyroidism, unspecified: Secondary | ICD-10-CM | POA: Diagnosis not present

## 2015-02-27 DIAGNOSIS — D649 Anemia, unspecified: Secondary | ICD-10-CM

## 2015-02-27 DIAGNOSIS — N289 Disorder of kidney and ureter, unspecified: Secondary | ICD-10-CM

## 2015-02-27 DIAGNOSIS — C8590 Non-Hodgkin lymphoma, unspecified, unspecified site: Secondary | ICD-10-CM

## 2015-02-27 DIAGNOSIS — Z452 Encounter for adjustment and management of vascular access device: Secondary | ICD-10-CM | POA: Diagnosis not present

## 2015-02-27 DIAGNOSIS — Z95828 Presence of other vascular implants and grafts: Secondary | ICD-10-CM

## 2015-02-27 DIAGNOSIS — C8290 Follicular lymphoma, unspecified, unspecified site: Secondary | ICD-10-CM

## 2015-02-27 LAB — CBC WITH DIFFERENTIAL/PLATELET
BASO%: 0.2 % (ref 0.0–2.0)
BASOS ABS: 0 10*3/uL (ref 0.0–0.1)
EOS%: 3 % (ref 0.0–7.0)
Eosinophils Absolute: 0.1 10*3/uL (ref 0.0–0.5)
HEMATOCRIT: 35.7 % (ref 34.8–46.6)
HGB: 11.8 g/dL (ref 11.6–15.9)
LYMPH#: 1.7 10*3/uL (ref 0.9–3.3)
LYMPH%: 40 % (ref 14.0–49.7)
MCH: 29 pg (ref 25.1–34.0)
MCHC: 33.1 g/dL (ref 31.5–36.0)
MCV: 87.7 fL (ref 79.5–101.0)
MONO#: 0.3 10*3/uL (ref 0.1–0.9)
MONO%: 7.7 % (ref 0.0–14.0)
NEUT#: 2.1 10*3/uL (ref 1.5–6.5)
NEUT%: 49.1 % (ref 38.4–76.8)
PLATELETS: 218 10*3/uL (ref 145–400)
RBC: 4.07 10*6/uL (ref 3.70–5.45)
RDW: 14.5 % (ref 11.2–14.5)
WBC: 4.3 10*3/uL (ref 3.9–10.3)

## 2015-02-27 LAB — COMPREHENSIVE METABOLIC PANEL (CC13)
ALT: 14 U/L (ref 0–55)
ANION GAP: 7 meq/L (ref 3–11)
AST: 16 U/L (ref 5–34)
Albumin: 3.6 g/dL (ref 3.5–5.0)
Alkaline Phosphatase: 80 U/L (ref 40–150)
BUN: 23.2 mg/dL (ref 7.0–26.0)
CALCIUM: 9.1 mg/dL (ref 8.4–10.4)
CHLORIDE: 111 meq/L — AB (ref 98–109)
CO2: 24 meq/L (ref 22–29)
CREATININE: 2 mg/dL — AB (ref 0.6–1.1)
EGFR: 30 mL/min/{1.73_m2} — AB (ref >90)
Glucose: 90 mg/dl (ref 70–140)
POTASSIUM: 4.2 meq/L (ref 3.5–5.1)
Sodium: 143 mEq/L (ref 136–145)
Total Bilirubin: 0.46 mg/dL (ref 0.20–1.20)
Total Protein: 6.5 g/dL (ref 6.4–8.3)

## 2015-02-27 MED ORDER — HEPARIN SOD (PORK) LOCK FLUSH 100 UNIT/ML IV SOLN
500.0000 [IU] | Freq: Once | INTRAVENOUS | Status: AC
  Administered 2015-02-27: 500 [IU] via INTRAVENOUS
  Filled 2015-02-27: qty 5

## 2015-02-27 MED ORDER — SODIUM CHLORIDE 0.9 % IJ SOLN
10.0000 mL | INTRAMUSCULAR | Status: DC | PRN
  Administered 2015-02-27: 10 mL via INTRAVENOUS
  Filled 2015-02-27: qty 10

## 2015-02-27 NOTE — Patient Instructions (Signed)

## 2015-03-01 ENCOUNTER — Telehealth: Payer: Self-pay | Admitting: Oncology

## 2015-03-01 ENCOUNTER — Ambulatory Visit (HOSPITAL_BASED_OUTPATIENT_CLINIC_OR_DEPARTMENT_OTHER): Payer: Medicare Other | Admitting: Oncology

## 2015-03-01 VITALS — BP 132/81 | HR 72 | Temp 97.6°F | Resp 18 | Ht 66.0 in | Wt 220.7 lb

## 2015-03-01 DIAGNOSIS — Z8572 Personal history of non-Hodgkin lymphomas: Secondary | ICD-10-CM

## 2015-03-01 DIAGNOSIS — C8590 Non-Hodgkin lymphoma, unspecified, unspecified site: Secondary | ICD-10-CM

## 2015-03-01 DIAGNOSIS — E039 Hypothyroidism, unspecified: Secondary | ICD-10-CM

## 2015-03-01 NOTE — Telephone Encounter (Signed)
per pof to sch pt appt-gave pt copy of avs °

## 2015-03-01 NOTE — Progress Notes (Signed)
Hematology and Oncology Follow Up Visit  Christine Morales [id number] [birth year] 69 y.o. 03/01/2015 9:08 AM  Principle Diagnosis: This is a 69 year old female with follicular lymphoma, non-Hodgkin type, stage IIA, presented with a large mesenteric mass in July 2011.  Prior Therapy: The patient received 5 cycles of bendamustine and rituximab, finished in December 2011.  She had a complete response at that time.  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  Current therapy: Observation and surveillance.   Interim History:  Christine Morales presents today for a followup visit. Since the last time, she notes no complaints. She does not report any lymphadenopathy or petechiae. She has not reported any constitutional symptoms of fevers, chills or weight loss. She is active and exercising regularly. She is able to drive and attends to activities of daily living. She does not report any complication related to her Port-A-Cath.  She did not report any nausea, did not report any vomiting. She did not report any fevers, did not report any chills. She has not reported any headaches or blurry vision or double vision. Has not reported any lymphadenopathy or petechiae. Has not reported any chest pain or shortness of breath. Has not reported any cough or hemoptysis. Is not reporting any change in her bowel habits. Her rest of review of systems was unremarkable.  Medications: I have reviewed the patient's current medications. No change by my review. Current Outpatient Prescriptions  Medication Sig Dispense Refill  . amLODipine (NORVASC) 5 MG tablet Take 1 tablet (5 mg total) by mouth daily. 30 tablet 11  . Aspirin-Salicylamide-Caffeine (BC HEADACHE PO) Take 1 packet by mouth daily as needed (headache).    . Cholecalciferol (VITAMIN D3) 1000 UNITS tablet Take 1,000 Units by mouth daily.      .  ferrous sulfate (FERRO-BOB) 325 (65 FE) MG tablet Take 325 mg by mouth daily with breakfast.      . levothyroxine (SYNTHROID, LEVOTHROID) 100 MCG tablet take 1 tablet by mouth once daily 30 tablet 5  . lidocaine-prilocaine (EMLA) cream Apply 1 application topically as needed. Apply to port-a-cath site 1 to 2 hours prior to appt. 30 g 1  . losartan (COZAAR) 100 MG tablet Take 1 tablet (100 mg total) by mouth daily. 30 tablet 11   No current facility-administered medications for this visit.   Facility-Administered Medications Ordered in Other Visits  Medication Dose Route Frequency Provider Last Rate Last Dose  . heparin lock flush 100 unit/mL  500 Units Intravenous Once Wyatt Portela, MD      . sodium chloride 0.9 % injection 10 mL  10 mL Intravenous PRN Wyatt Portela, MD        Allergies: No Known Allergies  Physical Exam: Blood pressure 132/81, pulse 72, temperature 97.6 F (36.4 C), temperature source Oral, resp. rate 18, height 5\' 6"  (1.676 m), weight 220 lb 11.2 oz (100.109 kg), SpO2 100 %. ECOG: 1 General appearance: alert take a pleasant woman without distress. Head: Normocephalic, without obvious abnormality Neck: no adenopathy, no thyroid masses. Lymph nodes: Cervical, supraclavicular, and axillary nodes normal. Heart:regular rate and rhythm, S1, S2 normal, no murmur, click, rub or gallop Lung:chest clear, no wheezing, rales, normal symmetric air entry Abdomin: soft, non-tender, without masses or organomegaly no shifting dullness or ascites. EXT:no erythema, induration, or nodules   Lab Results: Lab Results  Component Value Date   WBC 4.3  02/27/2015   HGB 11.8 02/27/2015   HCT 35.7 02/27/2015   MCV 87.7 02/27/2015   PLT 218 02/27/2015    Impression and Plan:  This is a pleasant 69 year old female with the following issues:  1. Follicular lymphoma, stage IIA disease, presenting with a large mesenteric mass, status post systemic chemotherapy completed in December  2011.  She is currently on active surveillance. CT scan from 03/07/2014 did not show any evidence of relapse. Clinically she is doing very well today without any evidence to suggest recurrent disease. The plan is to continue with active surveillance and repeat imaging studies as needed if she develops any symptoms. 2. Port-A-Cath management.  We will flush her Port-A-Cath every 8 weeks.  She elected to keep it for the time being but we will consider removal in the future. 3.     Mild Anemia: likely renal insufficiency related and her hemoglobin is back to normal at this time. 4.     Hypothyroidism: Followed by her primary care physician. 5.     Follow up: in 6 months for a clinical visit.      [facility], MD 9/8/20169:08 AM

## 2015-05-09 ENCOUNTER — Ambulatory Visit: Payer: Medicare Other | Admitting: Internal Medicine

## 2015-05-16 ENCOUNTER — Other Ambulatory Visit: Payer: Self-pay | Admitting: Internal Medicine

## 2015-07-05 ENCOUNTER — Ambulatory Visit (INDEPENDENT_AMBULATORY_CARE_PROVIDER_SITE_OTHER)
Admission: RE | Admit: 2015-07-05 | Discharge: 2015-07-05 | Disposition: A | Discharge status: Home / Self Care | Payer: Medicare Other | Source: Ambulatory Visit | Attending: Internal Medicine | Admitting: Internal Medicine

## 2015-07-05 ENCOUNTER — Other Ambulatory Visit (INDEPENDENT_AMBULATORY_CARE_PROVIDER_SITE_OTHER): Payer: Medicare Other

## 2015-07-05 ENCOUNTER — Encounter: Payer: Self-pay | Admitting: Internal Medicine

## 2015-07-05 ENCOUNTER — Ambulatory Visit (INDEPENDENT_AMBULATORY_CARE_PROVIDER_SITE_OTHER): Payer: Medicare Other | Admitting: Internal Medicine

## 2015-07-05 VITALS — BP 100/64 | HR 78 | Wt 219.0 lb

## 2015-07-05 DIAGNOSIS — R1032 Left lower quadrant pain: Secondary | ICD-10-CM | POA: Diagnosis not present

## 2015-07-05 DIAGNOSIS — C8283 Other types of follicular lymphoma, intra-abdominal lymph nodes: Secondary | ICD-10-CM

## 2015-07-05 DIAGNOSIS — J209 Acute bronchitis, unspecified: Secondary | ICD-10-CM

## 2015-07-05 DIAGNOSIS — I10 Essential (primary) hypertension: Secondary | ICD-10-CM | POA: Diagnosis not present

## 2015-07-05 LAB — CBC WITH DIFFERENTIAL/PLATELET
BASOS ABS: 0 10*3/uL (ref 0.0–0.1)
Basophils Relative: 0.3 % (ref 0.0–3.0)
EOS ABS: 0.1 10*3/uL (ref 0.0–0.7)
Eosinophils Relative: 2.9 % (ref 0.0–5.0)
HCT: 37.3 % (ref 36.0–46.0)
Hemoglobin: 12.5 g/dL (ref 12.0–15.0)
Lymphocytes Relative: 42.9 % (ref 12.0–46.0)
Lymphs Abs: 2.2 10*3/uL (ref 0.7–4.0)
MCHC: 33.4 g/dL (ref 30.0–36.0)
MCV: 85.6 fl (ref 78.0–100.0)
MONO ABS: 0.4 10*3/uL (ref 0.1–1.0)
Monocytes Relative: 7.9 % (ref 3.0–12.0)
Neutro Abs: 2.3 10*3/uL (ref 1.4–7.7)
Neutrophils Relative %: 46 % (ref 43.0–77.0)
Platelets: 270 10*3/uL (ref 150.0–400.0)
RBC: 4.36 Mil/uL (ref 3.87–5.11)
RDW: 14.5 % (ref 11.5–15.5)
WBC: 5 10*3/uL (ref 4.0–10.5)

## 2015-07-05 LAB — BASIC METABOLIC PANEL
BUN: 23 mg/dL (ref 6–23)
CHLORIDE: 107 meq/L (ref 96–112)
CO2: 25 meq/L (ref 19–32)
Calcium: 9.6 mg/dL (ref 8.4–10.5)
Creatinine, Ser: 1.89 mg/dL — ABNORMAL HIGH (ref 0.40–1.20)
GFR: 34.09 mL/min — AB (ref >60.00)
GLUCOSE: 101 mg/dL — AB (ref 70–99)
POTASSIUM: 4.3 meq/L (ref 3.5–5.1)
SODIUM: 142 meq/L (ref 135–145)

## 2015-07-05 IMAGING — DX DG CHEST 2V
2 series · 2 of 2 positions shown · non-contrast
Comparison: Chest radiograph [DATE]; chest CT [DATE]

CLINICAL DATA: Ten day history of cough and congestion. History of
non-Hodgkin's lymphoma

EXAM:
CHEST  2 VIEW

[chest pa]
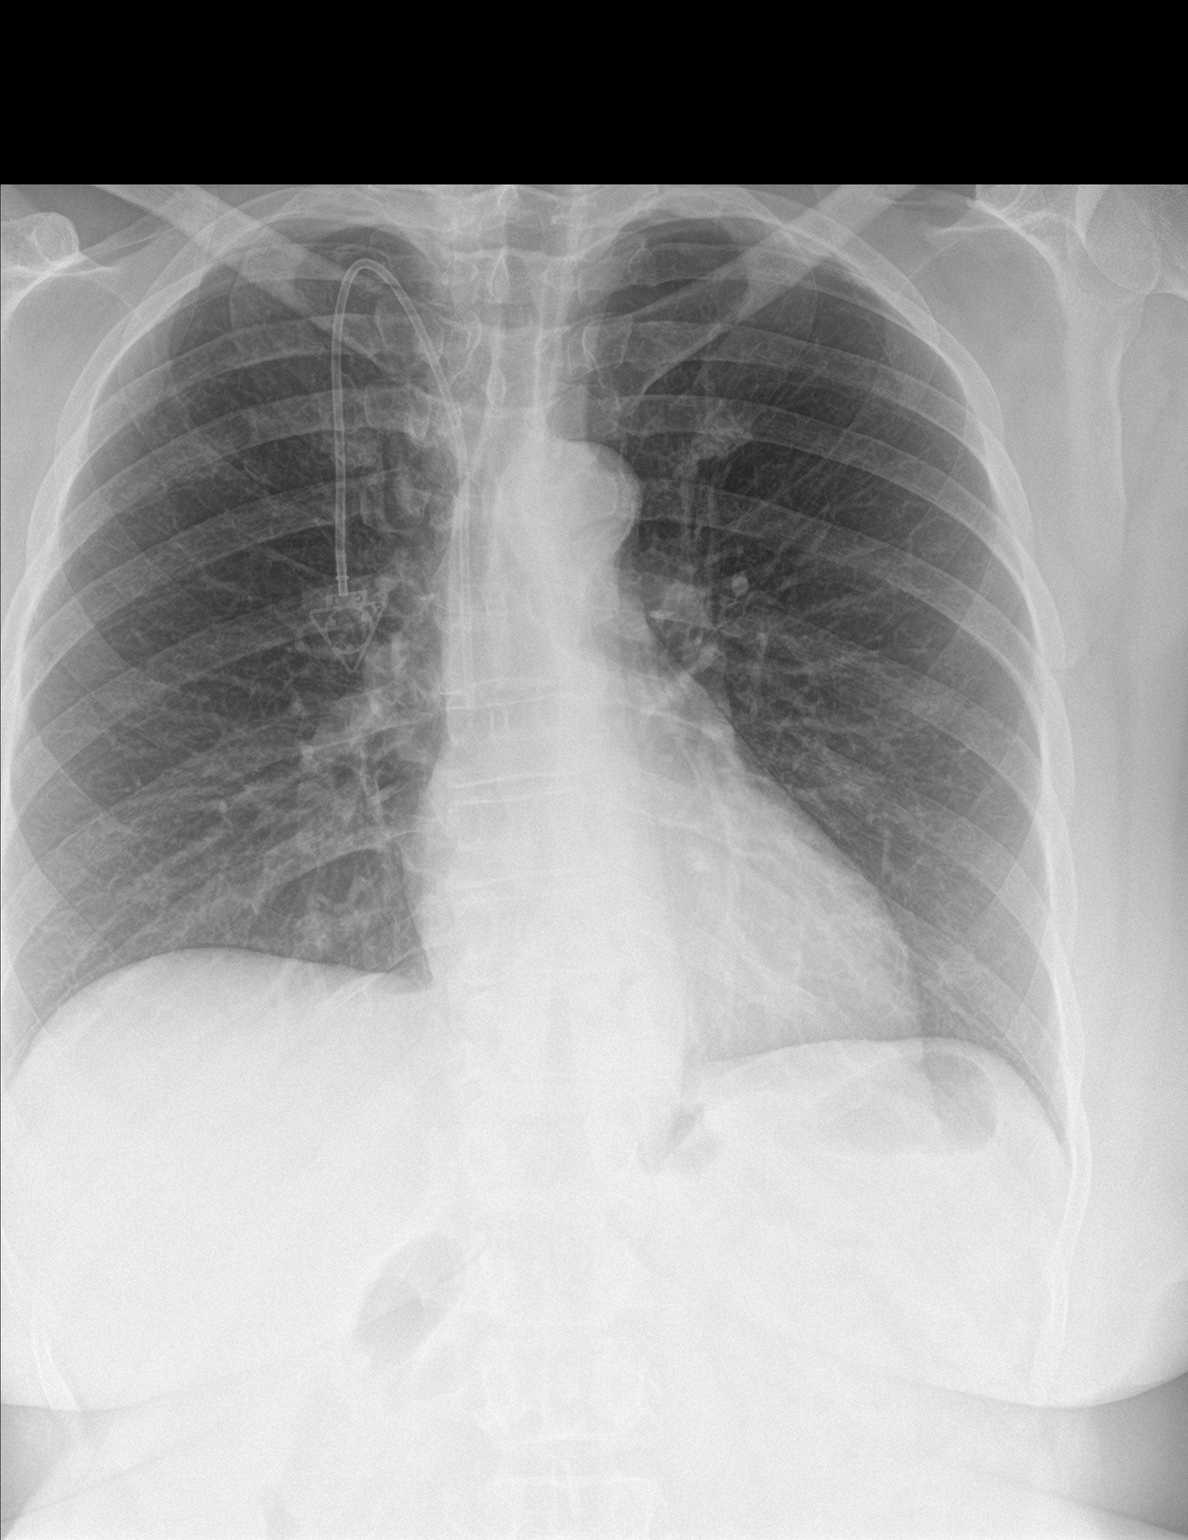

[chest lat]
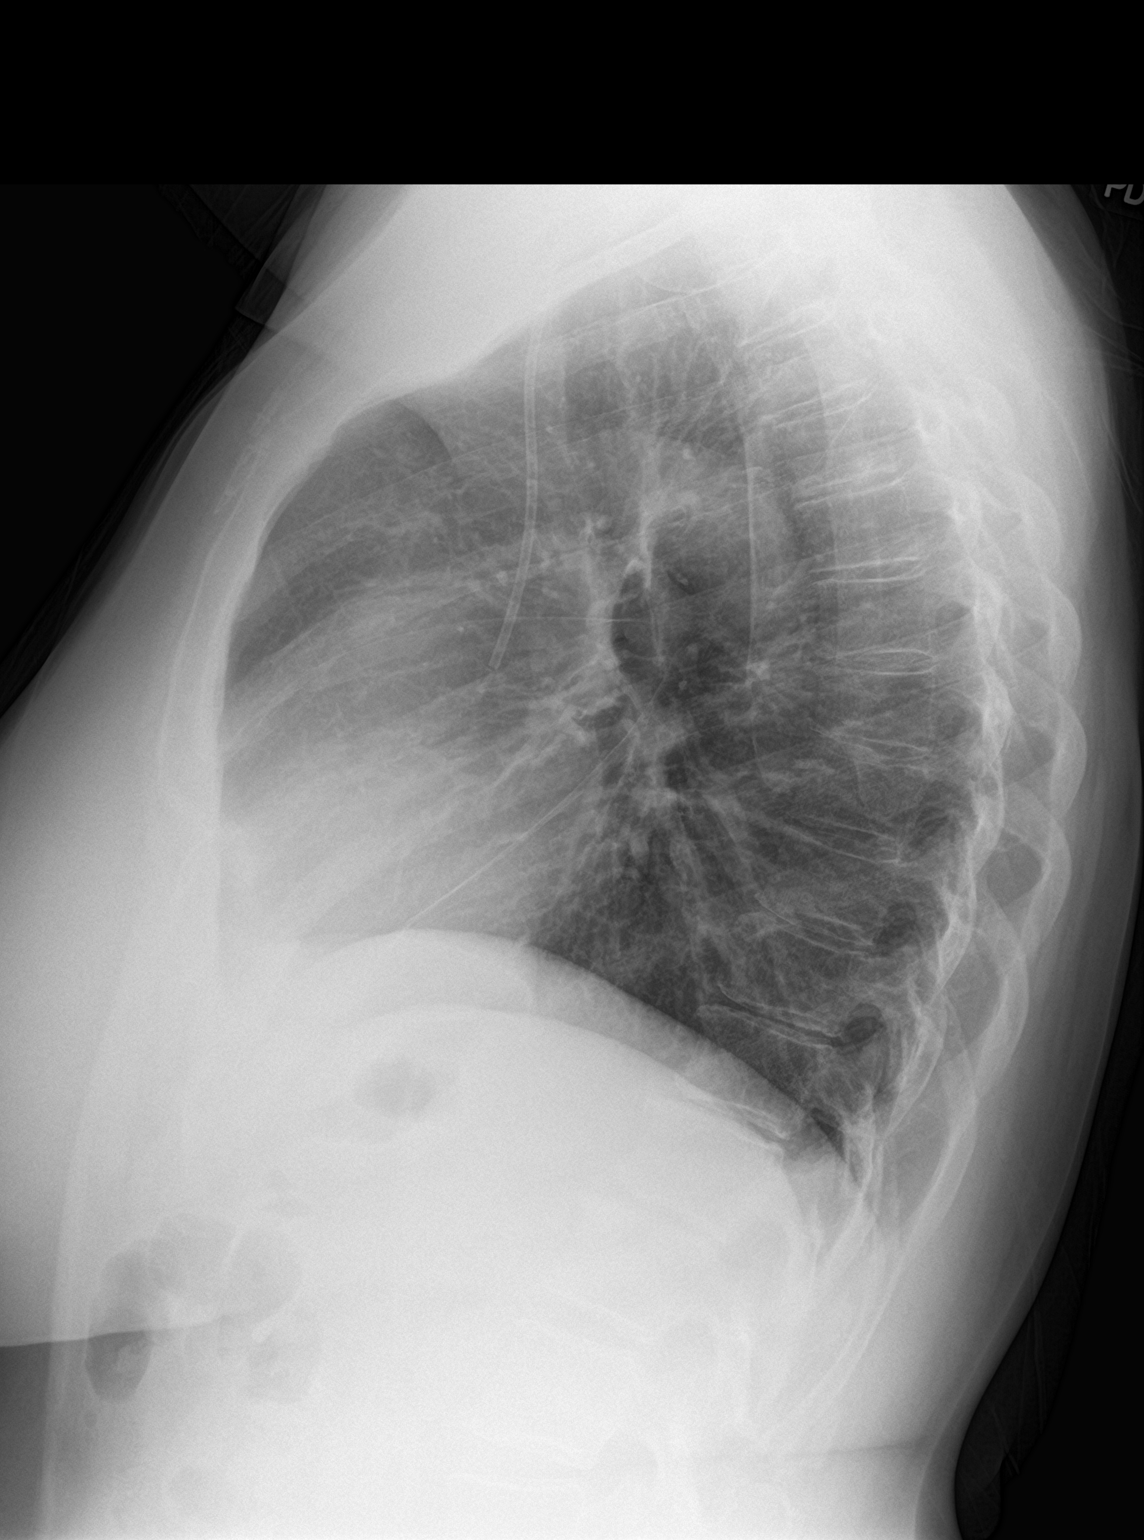

[2 of 2 positions shown; findings below may reference images not displayed]

FINDINGS: Port-A-Cath tip is in the superior vena cava. No pneumothorax. There
is no edema or consolidation. Heart size and pulmonary vascularity
are normal. No adenopathy. No bone lesions are appreciable.
IMPRESSION: No edema or consolidation.  No adenopathy appreciable.

## 2015-07-05 MED ORDER — AZITHROMYCIN 250 MG PO TABS: ORAL_TABLET | ORAL | Status: DC

## 2015-07-05 MED ORDER — PROMETHAZINE-CODEINE 6.25-10 MG/5ML PO SYRP: 5.0000 mL | ORAL_SOLUTION | ORAL | Status: DC | PRN

## 2015-07-05 NOTE — Progress Notes (Signed)
Subjective:  Patient ID: Christine Morales, female    DOB: [birth year]  Age: 70 y.o. MRN: [id number]  CC: No chief complaint on file.   HPI Christine Morales presents for cough x 10 d and LLQ discomfort x 2 months, worse w/coughing. F/u HTN. H/o lymphoma  Outpatient Prescriptions Prior to Visit  Medication Sig Dispense Refill  . amLODipine (NORVASC) 5 MG tablet Take 1 tablet (5 mg total) by mouth daily. 30 tablet 11  . Aspirin-Salicylamide-Caffeine (BC HEADACHE PO) Take 1 packet by mouth daily as needed (headache).    . Cholecalciferol (VITAMIN D3) 1000 UNITS tablet Take 1,000 Units by mouth daily.      . Cyanocobalamin (VITAMIN B 12 PO) Take 1,000 mcg by mouth daily.    . ferrous sulfate (FERRO-BOB) 325 (65 FE) MG tablet Take 325 mg by mouth daily with breakfast.      . levothyroxine (SYNTHROID, LEVOTHROID) 100 MCG tablet take 1 tablet by mouth once daily  **LABS DUE IN SEPT 30 tablet 11  . lidocaine-prilocaine (EMLA) cream Apply 1 application topically as needed. Apply to port-a-cath site 1 to 2 hours prior to appt. 30 g 1  . losartan (COZAAR) 100 MG tablet Take 1 tablet (100 mg total) by mouth daily. 30 tablet 11   Facility-Administered Medications Prior to Visit  Medication Dose Route Frequency Provider Last Rate Last Dose  . heparin lock flush 100 unit/mL  500 Units Intravenous Once Wyatt Portela, MD      . sodium chloride 0.9 % injection 10 mL  10 mL Intravenous PRN Wyatt Portela, MD        ROS Review of Systems  Constitutional: Negative for chills, activity change, appetite change, fatigue and unexpected weight change.  HENT: Negative for congestion, mouth sores and sinus pressure.   Eyes: Negative for visual disturbance.  Respiratory: Positive for cough. Negative for chest tightness, shortness of breath and wheezing.   Gastrointestinal: Positive for abdominal pain. Negative for nausea.  Genitourinary: Negative for frequency, difficulty urinating and vaginal pain.  Musculoskeletal:  Negative for back pain and gait problem.  Skin: Negative for pallor and rash.  Neurological: Negative for dizziness, tremors, weakness, numbness and headaches.  Psychiatric/Behavioral: Negative for confusion and sleep disturbance.    Objective:  BP 100/64 mmHg  Pulse 78  Wt 219 lb (99.338 kg)  SpO2 97%  BP Readings from Last 3 Encounters:  07/05/15 100/64  03/01/15 132/81  02/12/15 102/70    Wt Readings from Last 3 Encounters:  07/05/15 219 lb (99.338 kg)  03/01/15 220 lb 11.2 oz (100.109 kg)  02/12/15 221 lb (100.245 kg)    Physical Exam  Constitutional: She appears well-developed. No distress.  HENT:  Head: Normocephalic.  Right Ear: External ear normal.  Left Ear: External ear normal.  Nose: Nose normal.  Mouth/Throat: Oropharynx is clear and moist.  Eyes: Conjunctivae are normal. Pupils are equal, round, and reactive to light. Right eye exhibits no discharge. Left eye exhibits no discharge.  Neck: Normal range of motion. Neck supple. No JVD present. No tracheal deviation present. No thyromegaly present.  Cardiovascular: Normal rate, regular rhythm and normal heart sounds.   Pulmonary/Chest: No stridor. No respiratory distress. She has no wheezes.  Abdominal: Soft. Bowel sounds are normal. She exhibits no distension and no mass. There is no tenderness. There is no rebound and no guarding.  Musculoskeletal: She exhibits no edema or tenderness.  Lymphadenopathy:    She has no cervical adenopathy.  Neurological: She  displays normal reflexes. No cranial nerve deficit. She exhibits normal muscle tone. Coordination normal.  Skin: No rash noted. No erythema.  Psychiatric: She has a normal mood and affect. Her behavior is normal. Judgment and thought content normal.  LLQ abd w/a 20 cm bulge when coughing   Lab Results  Component Value Date   WBC 4.3 02/27/2015   HGB 11.8 02/27/2015   HCT 35.7 02/27/2015   PLT 218 02/27/2015   GLUCOSE 90 02/27/2015   CHOL 205*  02/12/2015   TRIG 198.0* 02/12/2015   HDL 51.30 02/12/2015   LDLCALC 114* 02/12/2015   ALT 14 02/27/2015   AST 16 02/27/2015   NA 143 02/27/2015   K 4.2 02/27/2015   CL 104 02/12/2015   CREATININE 2.0* 02/27/2015   BUN 23.2 02/27/2015   CO2 24 02/27/2015   TSH 2.26 02/12/2015   INR 1.08 01/27/2010    Dexascan  02/12/2015  Date of study: 02/12/2015 Exam: DUAL X-RAY ABSORPTIOMETRY (DXA) FOR BONE MINERAL DENSITY (BMD) Instrument: [employer] Provider: PCP Indication: screening for osteopenia Comparison: none (please note that it is not possible to compare data from different instruments) Clinical data: Pt is a postmenopausal 70 y.o. female without previous nontraumatic fractures. On vitamin D. Results:  Lumbar spine (L1-L4) Femoral neck (FN) T-score  +0.7  RFN: -0.3 LFN: -0.5  Assessment: the BMD is normal according to the [facility] classification for osteoporosis (see below). Fracture risk: low FRAX score: not calculated due to normal BMD Comments: the technical quality of the study is good Recommend optimizing calcium (1200 mg/day) and vitamin D (800 IU/day) intake. No pharmacological treatment is indicated. Followup: Repeat BMD is appropriate after 2 years. WHO criteria for diagnosis of osteoporosis in postmenopausal women and in men 81 y/o or older: - normal: T-score -1.0 to + 1.0 - osteopenia/low bone density: T-score between -2.5 and -1.0 - osteoporosis: T-score below -2.5 - severe osteoporosis: T-score below -2.5 with history of fragility fracture Note: although not part of the WHO classification, the presence of a fragility fracture, regardless of the T-score, should be considered diagnostic of osteoporosis, provided other causes for the fracture have been excluded. Philemon Kingdom, MD [facility]    Assessment & Plan:   There are no diagnoses linked to this encounter. I am having Christine Morales maintain her ferrous sulfate, cholecalciferol, lidocaine-prilocaine,  Aspirin-Salicylamide-Caffeine (BC HEADACHE PO), amLODipine, losartan, Cyanocobalamin (VITAMIN B 12 PO), and levothyroxine.  No orders of the defined types were placed in this encounter.     Follow-up: No Follow-up on file.  Walker Kehr, MD

## 2015-07-05 NOTE — Assessment & Plan Note (Signed)
  1/17 LLQ abd w/a 20 cm bulge when coughing Labs abd CT

## 2015-07-05 NOTE — Progress Notes (Signed)
Pre visit review using our clinic review tool, if applicable. No additional management support is needed unless otherwise documented below in the visit note. 

## 2015-07-05 NOTE — Assessment & Plan Note (Signed)
  1/17 LLQ abd w/a 20 cm bulge when coughing

## 2015-07-05 NOTE — Assessment & Plan Note (Signed)
On Amlodipine, Losartan °

## 2015-07-05 NOTE — Assessment & Plan Note (Signed)
CXR Zpac Prom-cod Rx

## 2015-07-09 ENCOUNTER — Telehealth: Payer: Self-pay | Admitting: Oncology

## 2015-07-09 NOTE — Telephone Encounter (Signed)
Patient called in to r/s flush

## 2015-07-12 ENCOUNTER — Ambulatory Visit (HOSPITAL_BASED_OUTPATIENT_CLINIC_OR_DEPARTMENT_OTHER): Payer: Medicare Other

## 2015-07-12 VITALS — BP 112/64 | HR 74 | Temp 97.0°F | Resp 18

## 2015-07-12 DIAGNOSIS — Z8572 Personal history of non-Hodgkin lymphomas: Secondary | ICD-10-CM | POA: Diagnosis not present

## 2015-07-12 DIAGNOSIS — Z95828 Presence of other vascular implants and grafts: Secondary | ICD-10-CM

## 2015-07-12 DIAGNOSIS — Z452 Encounter for adjustment and management of vascular access device: Secondary | ICD-10-CM

## 2015-07-12 MED ORDER — HEPARIN SOD (PORK) LOCK FLUSH 100 UNIT/ML IV SOLN
500.0000 [IU] | Freq: Once | INTRAVENOUS | Status: AC
  Administered 2015-07-12: 500 [IU] via INTRAVENOUS
  Filled 2015-07-12: qty 5

## 2015-07-12 MED ORDER — SODIUM CHLORIDE 0.9 % IJ SOLN
10.0000 mL | INTRAMUSCULAR | Status: DC | PRN
  Administered 2015-07-12: 10 mL via INTRAVENOUS
  Filled 2015-07-12: qty 10

## 2015-07-12 NOTE — Patient Instructions (Signed)

## 2015-07-19 ENCOUNTER — Encounter: Payer: Self-pay | Admitting: Internal Medicine

## 2015-07-19 ENCOUNTER — Ambulatory Visit (INDEPENDENT_AMBULATORY_CARE_PROVIDER_SITE_OTHER): Payer: Medicare Other | Admitting: Internal Medicine

## 2015-07-19 VITALS — BP 100/56 | HR 87 | Wt 219.0 lb

## 2015-07-19 DIAGNOSIS — R1032 Left lower quadrant pain: Secondary | ICD-10-CM | POA: Diagnosis not present

## 2015-07-19 DIAGNOSIS — J209 Acute bronchitis, unspecified: Secondary | ICD-10-CM | POA: Diagnosis not present

## 2015-07-19 DIAGNOSIS — C8283 Other types of follicular lymphoma, intra-abdominal lymph nodes: Secondary | ICD-10-CM | POA: Diagnosis not present

## 2015-07-19 DIAGNOSIS — I10 Essential (primary) hypertension: Secondary | ICD-10-CM | POA: Diagnosis not present

## 2015-07-19 NOTE — Progress Notes (Signed)
Subjective:  Patient ID: Christine Morales, female    DOB: 1945-11-21  Age: 70 y.o. MRN: [id number]  CC: No chief complaint on file.   HPI Christine Morales presents for cough - resolving; abd mass f/up. Pt has not had a CT yet. CXR was ok  Outpatient Prescriptions Prior to Visit  Medication Sig Dispense Refill  . amLODipine (NORVASC) 5 MG tablet Take 1 tablet (5 mg total) by mouth daily. 30 tablet 11  . Aspirin-Salicylamide-Caffeine (BC HEADACHE PO) Take 1 packet by mouth daily as needed (headache).    . Cholecalciferol (VITAMIN D3) 1000 UNITS tablet Take 1,000 Units by mouth daily.      . Cyanocobalamin (VITAMIN B 12 PO) Take 1,000 mcg by mouth daily.    . ferrous sulfate (FERRO-BOB) 325 (65 FE) MG tablet Take 325 mg by mouth daily with breakfast.      . levothyroxine (SYNTHROID, LEVOTHROID) 100 MCG tablet take 1 tablet by mouth once daily  **LABS DUE IN SEPT 30 tablet 11  . lidocaine-prilocaine (EMLA) cream Apply 1 application topically as needed. Apply to port-a-cath site 1 to 2 hours prior to appt. 30 g 1  . losartan (COZAAR) 100 MG tablet Take 1 tablet (100 mg total) by mouth daily. 30 tablet 11  . promethazine-codeine (PHENERGAN WITH CODEINE) 6.25-10 MG/5ML syrup Take 5 mLs by mouth every 4 (four) hours as needed. (Patient not taking: Reported on 07/19/2015) 300 mL 0  . azithromycin (ZITHROMAX) 250 MG tablet As directed (Patient not taking: Reported on 07/19/2015) 6 tablet 0   Facility-Administered Medications Prior to Visit  Medication Dose Route Frequency Provider Last Rate Last Dose  . heparin lock flush 100 unit/mL  500 Units Intravenous Once Wyatt Portela, MD      . sodium chloride 0.9 % injection 10 mL  10 mL Intravenous PRN Wyatt Portela, MD        ROS Review of Systems  Constitutional: Negative for fever, appetite change and fatigue.  Respiratory: Negative for cough and shortness of breath.   Cardiovascular: Negative for leg swelling.  Gastrointestinal: Positive for abdominal  distention. Negative for nausea and abdominal pain.  Genitourinary: Negative for decreased urine volume.  Musculoskeletal: Negative for back pain.    Objective:  BP 100/56 mmHg  Pulse 87  Wt 219 lb (99.338 kg)  SpO2 95%  BP Readings from Last 3 Encounters:  07/19/15 100/56  07/12/15 112/64  07/05/15 100/64    Wt Readings from Last 3 Encounters:  07/19/15 219 lb (99.338 kg)  07/05/15 219 lb (99.338 kg)  03/01/15 220 lb 11.2 oz (100.109 kg)    Physical Exam  Constitutional: She appears well-developed. No distress.  HENT:  Head: Normocephalic.  Right Ear: External ear normal.  Left Ear: External ear normal.  Nose: Nose normal.  Mouth/Throat: Oropharynx is clear and moist.  Eyes: Conjunctivae are normal. Pupils are equal, round, and reactive to light. Right eye exhibits no discharge. Left eye exhibits no discharge.  Neck: Normal range of motion. Neck supple. No JVD present. No tracheal deviation present. No thyromegaly present.  Cardiovascular: Normal rate, regular rhythm and normal heart sounds.   Pulmonary/Chest: No stridor. No respiratory distress. She has no wheezes.  Abdominal: Soft. Bowel sounds are normal. She exhibits mass. She exhibits no distension. There is no rebound and no guarding.  Musculoskeletal: She exhibits no edema or tenderness.  Lymphadenopathy:    She has no cervical adenopathy.  Neurological: She displays normal reflexes. No cranial nerve  deficit. She exhibits normal muscle tone. Coordination normal.  Skin: No rash noted. No erythema.  Psychiatric: She has a normal mood and affect. Her behavior is normal. Judgment and thought content normal.  LLQ bulge  Lab Results  Component Value Date   WBC 5.0 07/05/2015   HGB 12.5 07/05/2015   HCT 37.3 07/05/2015   PLT 270.0 07/05/2015   GLUCOSE 101* 07/05/2015   CHOL 205* 02/12/2015   TRIG 198.0* 02/12/2015   HDL 51.30 02/12/2015   LDLCALC 114* 02/12/2015   ALT 14 02/27/2015   AST 16 02/27/2015   NA  142 07/05/2015   K 4.3 07/05/2015   CL 107 07/05/2015   CREATININE 1.89* 07/05/2015   BUN 23 07/05/2015   CO2 25 07/05/2015   TSH 2.26 02/12/2015   INR 1.08 01/27/2010    Dg Chest 2 View  07/05/2015  CLINICAL DATA:  Ten day history of cough and congestion. History of non-Hodgkin's lymphoma EXAM: CHEST  2 VIEW COMPARISON:  Chest radiograph April 29, 2010; chest CT March 07, 2014 FINDINGS: Port-A-Cath tip is in the superior vena cava. No pneumothorax. There is no edema or consolidation. Heart size and pulmonary vascularity are normal. No adenopathy. No bone lesions are appreciable. IMPRESSION: No edema or consolidation.  No adenopathy appreciable. Electronically Signed   By: Lowella Grip III M.D.   On: 07/05/2015 09:15    Assessment & Plan:   There are no diagnoses linked to this encounter. I have discontinued Ms. Kies azithromycin. I am also having her maintain her ferrous sulfate, cholecalciferol, lidocaine-prilocaine, Aspirin-Salicylamide-Caffeine (BC HEADACHE PO), amLODipine, losartan, Cyanocobalamin (VITAMIN B 12 PO), levothyroxine, and promethazine-codeine.  No orders of the defined types were placed in this encounter.     Follow-up: No Follow-up on file.  Walker Kehr, MD

## 2015-07-19 NOTE — Assessment & Plan Note (Signed)
Resolving

## 2015-07-19 NOTE — Assessment & Plan Note (Signed)
  On Amlodipine, Losartan °

## 2015-07-19 NOTE — Assessment & Plan Note (Signed)
CT order is in process

## 2015-07-19 NOTE — Assessment & Plan Note (Addendum)
1/17 LLQ abd w/a 20 cm bulge when coughing  CT ordered - in process

## 2015-07-19 NOTE — Progress Notes (Signed)
Pre visit review using our clinic review tool, if applicable. No additional management support is needed unless otherwise documented below in the visit note. 

## 2015-07-23 ENCOUNTER — Ambulatory Visit (INDEPENDENT_AMBULATORY_CARE_PROVIDER_SITE_OTHER)
Admission: RE | Admit: 2015-07-23 | Discharge: 2015-07-23 | Disposition: A | Discharge status: Home / Self Care | Payer: Medicare Other | Source: Ambulatory Visit | Attending: Internal Medicine | Admitting: Internal Medicine

## 2015-07-23 DIAGNOSIS — R1032 Left lower quadrant pain: Secondary | ICD-10-CM | POA: Diagnosis not present

## 2015-07-23 DIAGNOSIS — C8283 Other types of follicular lymphoma, intra-abdominal lymph nodes: Secondary | ICD-10-CM | POA: Diagnosis not present

## 2015-07-23 IMAGING — CT CT ABD-PELV W/O CM
2 of 4 series · 16 of 46 positions shown, 18 images · non-contrast
Comparison: CT abdomen pelvis [DATE]

CLINICAL DATA: History of lymphoma.  Left lower quadrant pain

EXAM:
CT ABDOMEN AND PELVIS WITHOUT CONTRAST
TECHNIQUE: Multidetector CT imaging of the abdomen and pelvis was performed
following the standard protocol without IV contrast.

[Series 2: abd/ pelvis 5.0 i40f 2 · axial · 0.70mm/px · z∈[-499,-84]mm · 13 of 91 slices shown, 15 images]
[im 4/91  soft-tissue]
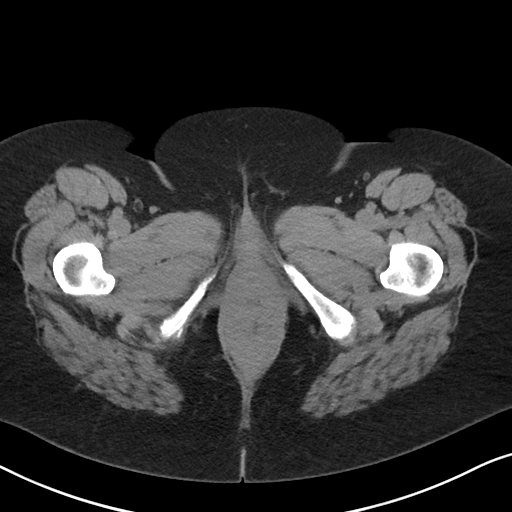
[im 4/91  bone]
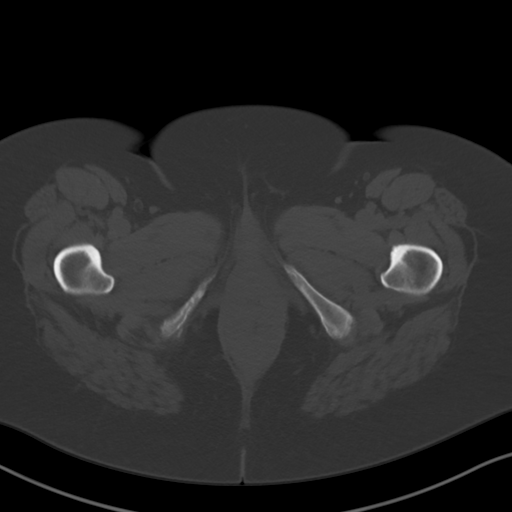
[im 11/91  soft-tissue]
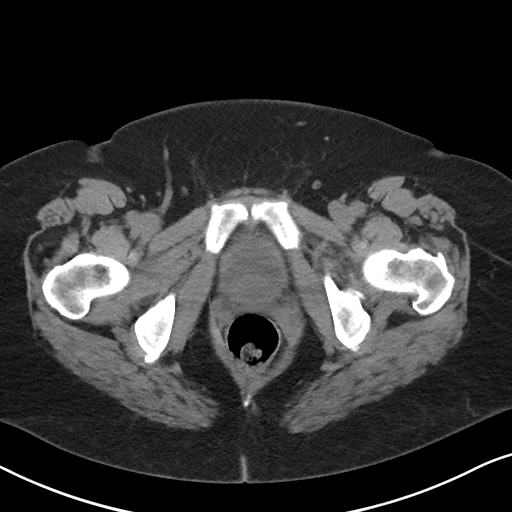
[im 19/91  soft-tissue]
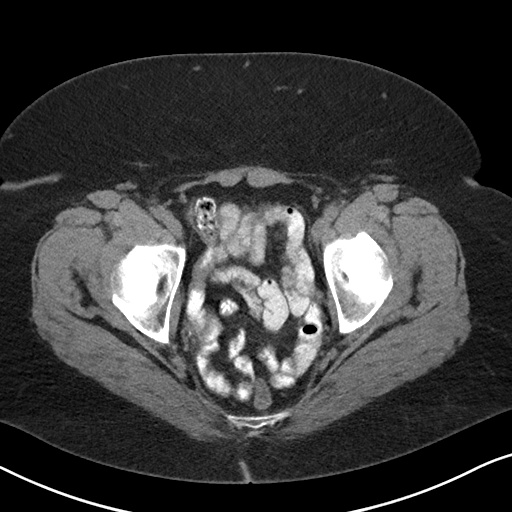
[im 26/91  soft-tissue]
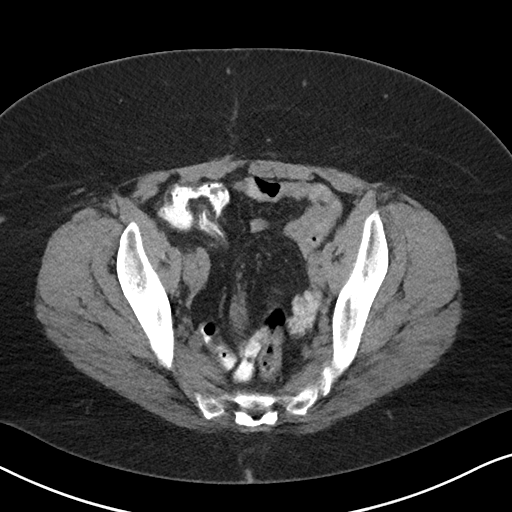
[im 33/91  soft-tissue]
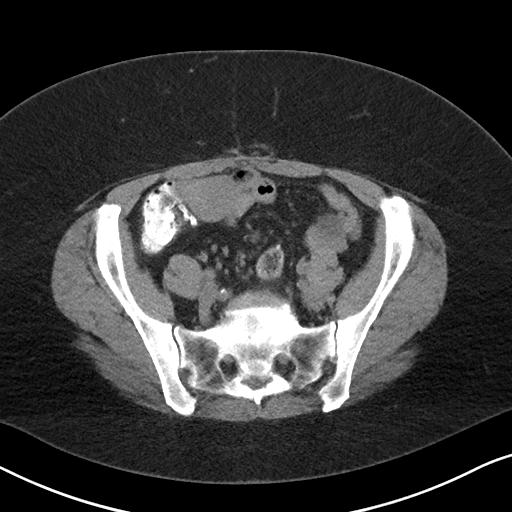
[im 40/91  soft-tissue]
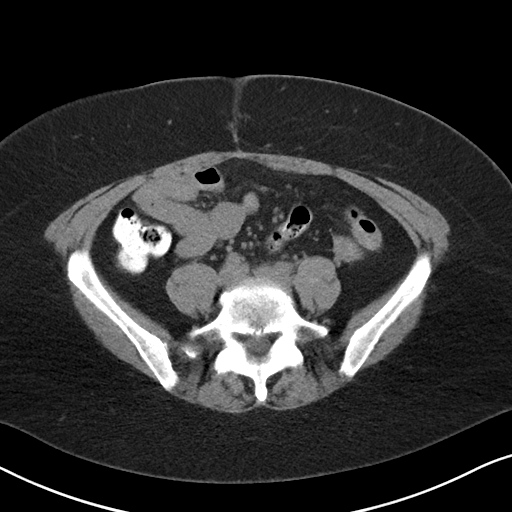
[im 47/91  soft-tissue]
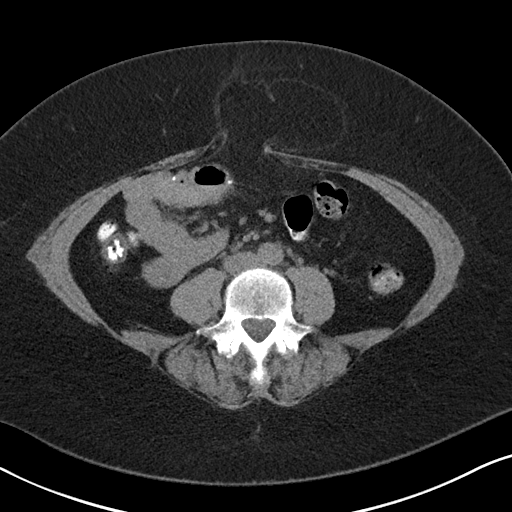
[im 51/91  soft-tissue]
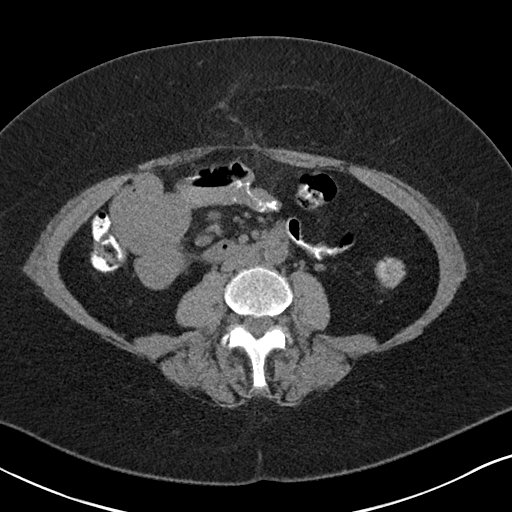
[im 58/91  soft-tissue]
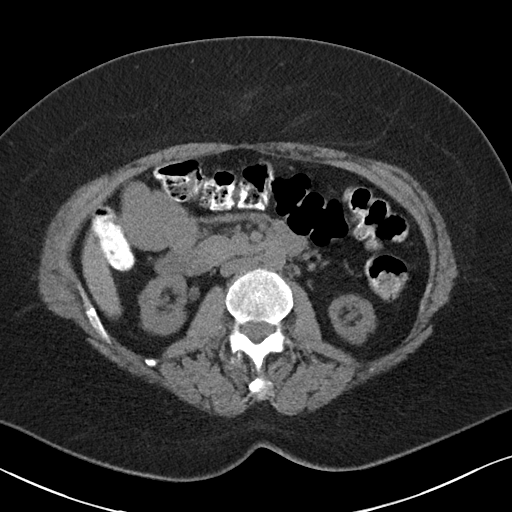
[im 58/91  bone]
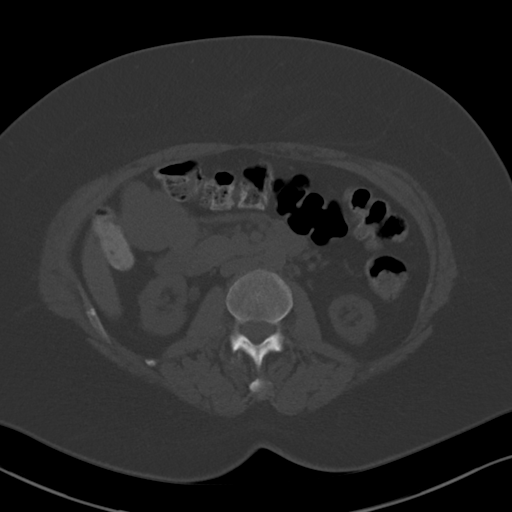
[im 65/91  soft-tissue]
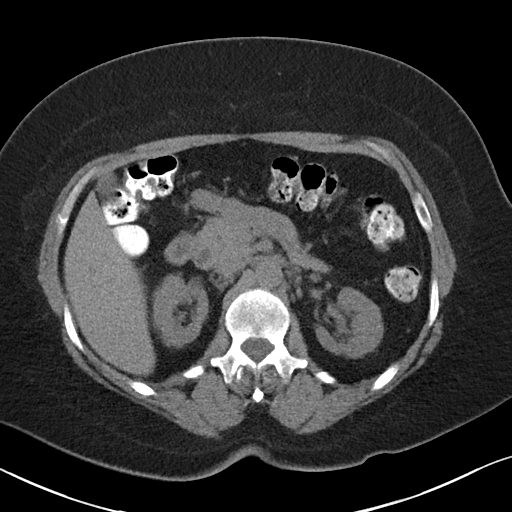
[im 73/91  soft-tissue]
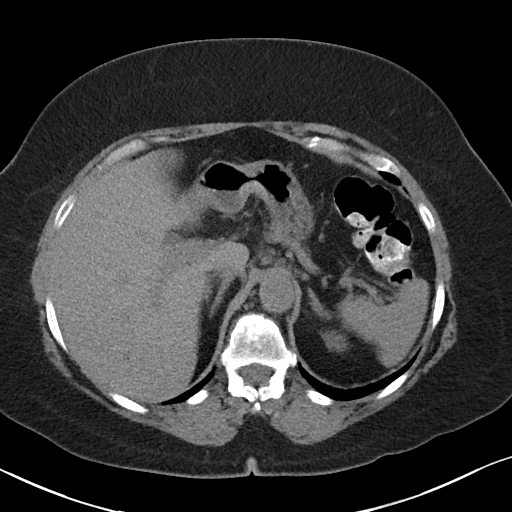
[im 80/91  soft-tissue]
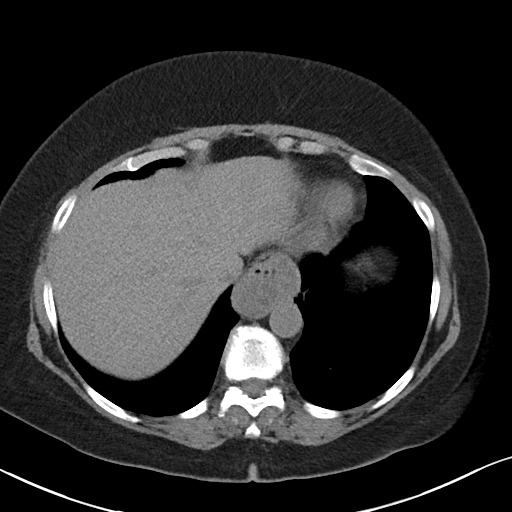
[im 87/91  soft-tissue]
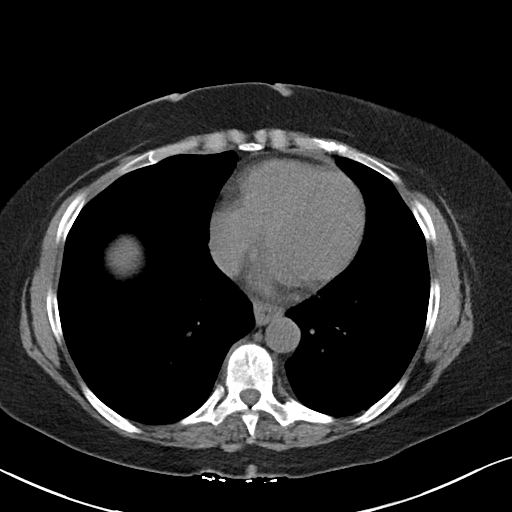

[Series 5: cor st · coronal · 0.66mm/px · 3 of 64 slices shown]
[im 22/64  soft-tissue]
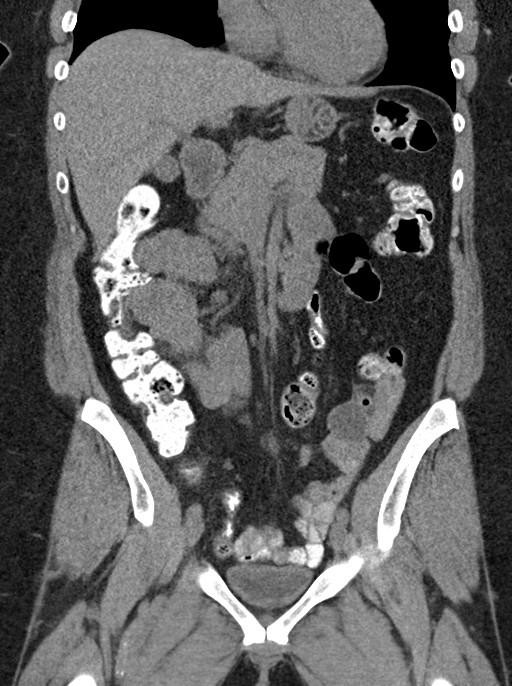
[im 29/64  soft-tissue]
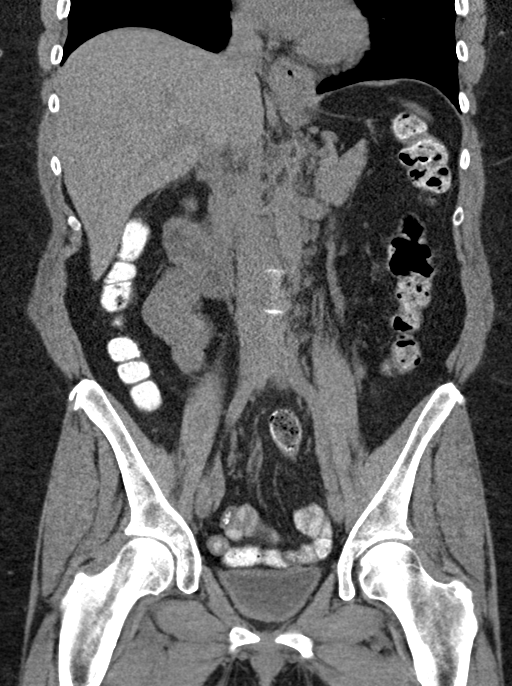
[im 36/64  soft-tissue]
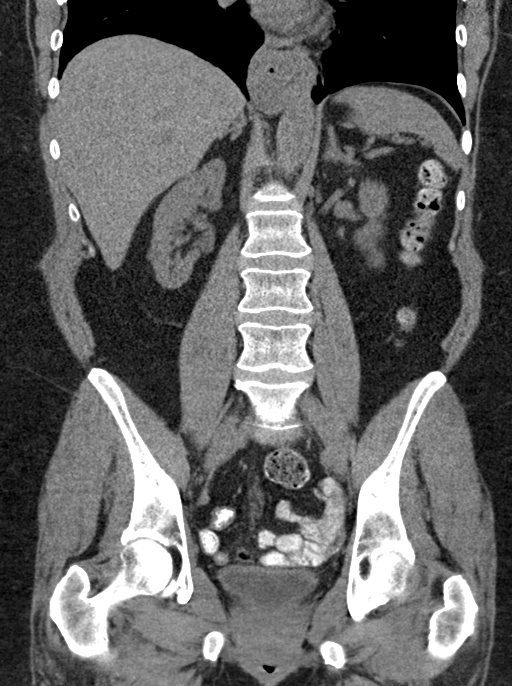

[16 of 46 positions shown; findings below may reference images not displayed]

FINDINGS: Lower chest: Lung bases clear without infiltrate or effusion. Heart
size normal.

Hepatobiliary: The liver is normal in size and contour without focal
lesion. Gallbladder and bile ducts normal.

Pancreas: Negative

Spleen: Negative

Adrenals/Urinary Tract: Negative for renal mass or obstruction. No
renal calculi. Urinary bladder normal.

Stomach/Bowel: Mild to moderate hiatal hernia. No bowel obstruction.
Prior bowel resection in the mid small bowel with surgical clips.
This is similar to the prior study. Interval development of 2.5 cm
cyst in the left abdomen at the level of the iliac crest. This is
unchanged from the prior study and may represent a duplication cyst

Vascular/Lymphatic: Negative for atherosclerotic disease or
aneurysm. No lymphadenopathy.

Reproductive: Prior hysterectomy.

Other: Ventral hernia containing omentum. This is unchanged from the
prior study. No fluid or bowel in the hernia sac.

Musculoskeletal: Moderate disc degeneration and spurring at L5-S1.
No acute bony abnormality.
IMPRESSION: No acute abnormality.

Negative for lymphoma recurrence.

Ventral hernia containing fat is unchanged

2.5 cm cyst in the left upper pelvis associated with bowel. This is
unchanged may represent a duplication cyst.

## 2015-08-13 ENCOUNTER — Ambulatory Visit: Payer: Medicare Other | Admitting: Internal Medicine

## 2015-08-15 ENCOUNTER — Ambulatory Visit: Payer: Medicare Other | Admitting: Internal Medicine

## 2015-08-30 ENCOUNTER — Ambulatory Visit (HOSPITAL_BASED_OUTPATIENT_CLINIC_OR_DEPARTMENT_OTHER): Payer: Medicare Other

## 2015-08-30 ENCOUNTER — Ambulatory Visit (HOSPITAL_BASED_OUTPATIENT_CLINIC_OR_DEPARTMENT_OTHER): Payer: Medicare Other | Admitting: Oncology

## 2015-08-30 ENCOUNTER — Telehealth: Payer: Self-pay | Admitting: Oncology

## 2015-08-30 ENCOUNTER — Other Ambulatory Visit (HOSPITAL_BASED_OUTPATIENT_CLINIC_OR_DEPARTMENT_OTHER): Payer: Medicare Other

## 2015-08-30 VITALS — BP 117/67 | HR 66 | Temp 97.9°F | Resp 18 | Wt 218.8 lb

## 2015-08-30 DIAGNOSIS — Z452 Encounter for adjustment and management of vascular access device: Secondary | ICD-10-CM

## 2015-08-30 DIAGNOSIS — Z95828 Presence of other vascular implants and grafts: Secondary | ICD-10-CM

## 2015-08-30 DIAGNOSIS — N289 Disorder of kidney and ureter, unspecified: Secondary | ICD-10-CM

## 2015-08-30 DIAGNOSIS — I10 Essential (primary) hypertension: Secondary | ICD-10-CM | POA: Diagnosis not present

## 2015-08-30 DIAGNOSIS — C8590 Non-Hodgkin lymphoma, unspecified, unspecified site: Secondary | ICD-10-CM

## 2015-08-30 DIAGNOSIS — C8213 Follicular lymphoma grade II, intra-abdominal lymph nodes: Secondary | ICD-10-CM

## 2015-08-30 DIAGNOSIS — Z8572 Personal history of non-Hodgkin lymphomas: Secondary | ICD-10-CM | POA: Diagnosis not present

## 2015-08-30 DIAGNOSIS — D649 Anemia, unspecified: Secondary | ICD-10-CM | POA: Diagnosis not present

## 2015-08-30 LAB — CBC WITH DIFFERENTIAL/PLATELET
BASO%: 0.8 % (ref 0.0–2.0)
Basophils Absolute: 0 10*3/uL (ref 0.0–0.1)
EOS%: 3.3 % (ref 0.0–7.0)
Eosinophils Absolute: 0.1 10*3/uL (ref 0.0–0.5)
HEMATOCRIT: 37 % (ref 34.8–46.6)
HGB: 12.3 g/dL (ref 11.6–15.9)
LYMPH#: 1.6 10*3/uL (ref 0.9–3.3)
LYMPH%: 38.6 % (ref 14.0–49.7)
MCH: 28.8 pg (ref 25.1–34.0)
MCHC: 33.3 g/dL (ref 31.5–36.0)
MCV: 86.4 fL (ref 79.5–101.0)
MONO#: 0.4 10*3/uL (ref 0.1–0.9)
MONO%: 9.5 % (ref 0.0–14.0)
NEUT%: 47.8 % (ref 38.4–76.8)
NEUTROS ABS: 2 10*3/uL (ref 1.5–6.5)
PLATELETS: 242 10*3/uL (ref 145–400)
RBC: 4.28 10*6/uL (ref 3.70–5.45)
RDW: 14.3 % (ref 11.2–14.5)
WBC: 4.1 10*3/uL (ref 3.9–10.3)

## 2015-08-30 LAB — COMPREHENSIVE METABOLIC PANEL
ALBUMIN: 3.7 g/dL (ref 3.5–5.0)
ALT: 12 U/L (ref 0–55)
ANION GAP: 8 meq/L (ref 3–11)
AST: 16 U/L (ref 5–34)
Alkaline Phosphatase: 95 U/L (ref 40–150)
BILIRUBIN TOTAL: 0.51 mg/dL (ref 0.20–1.20)
BUN: 23.4 mg/dL (ref 7.0–26.0)
CALCIUM: 9.3 mg/dL (ref 8.4–10.4)
CO2: 23 mEq/L (ref 22–29)
CREATININE: 1.9 mg/dL — AB (ref 0.6–1.1)
Chloride: 110 mEq/L — ABNORMAL HIGH (ref 98–109)
EGFR: 31 mL/min/{1.73_m2} — ABNORMAL LOW (ref >90)
Glucose: 112 mg/dl (ref 70–140)
Potassium: 4.2 mEq/L (ref 3.5–5.1)
Sodium: 141 mEq/L (ref 136–145)
TOTAL PROTEIN: 6.9 g/dL (ref 6.4–8.3)

## 2015-08-30 MED ORDER — SODIUM CHLORIDE 0.9% FLUSH
10.0000 mL | INTRAVENOUS | Status: DC | PRN
  Administered 2015-08-30: 10 mL via INTRAVENOUS
  Filled 2015-08-30: qty 10

## 2015-08-30 MED ORDER — HEPARIN SOD (PORK) LOCK FLUSH 100 UNIT/ML IV SOLN
500.0000 [IU] | Freq: Once | INTRAVENOUS | Status: AC
  Administered 2015-08-30: 500 [IU] via INTRAVENOUS
  Filled 2015-08-30: qty 5

## 2015-08-30 NOTE — Patient Instructions (Signed)

## 2015-08-30 NOTE — Progress Notes (Signed)
Hematology and Oncology Follow Up Visit  Christine Morales [id number] Sep 07, 1945 70 y.o. 08/30/2015 8:39 AM  Principle Diagnosis: This is a 70 year old female with follicular lymphoma, non-Hodgkin type, stage IIA, presented with a large mesenteric mass in July 2011.  Prior Therapy: She is S/P 5 cycles of bendamustine and rituximab, finished in December 2011. She had a complete response at that time. 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  Current therapy: Observation and surveillance.   Interim History:  Christine Morales presents today for a followup visit. Since the last visit, she notes no complaints. She did develop slight abdominal fullness and protrusion on the left side in January 2017. CT scan was obtained of the abdomen and pelvis which showed no evidence of recurrent lymphoma. She did have a ventral hernia likely the cause of her protrusion. These symptoms have resolved at this time.   She does not report any lymphadenopathy or petechiae. She has not reported any constitutional symptoms of fevers, chills or weight loss. She is able to drive and attends to activities of daily living. She does not report any complication related to her Port-A-Cath.  She did not report any nausea, did not report any vomiting. She did not report any fevers, did not report any chills. She has not reported any headaches or blurry vision or double vision. Has not reported any lymphadenopathy or petechiae. Has not reported any chest pain or shortness of breath. Has not reported any cough or hemoptysis. Is not reporting any change in her bowel habits. Her rest of review of systems was unremarkable.  Medications: I have reviewed the patient's current medications. No change by my review. Current Outpatient Prescriptions  Medication Sig Dispense Refill  . amLODipine (NORVASC) 5 MG tablet Take 1  tablet (5 mg total) by mouth daily. 30 tablet 11  . Aspirin-Salicylamide-Caffeine (BC HEADACHE PO) Take 1 packet by mouth daily as needed (headache).    . Cholecalciferol (VITAMIN D3) 1000 UNITS tablet Take 1,000 Units by mouth daily.      . Cyanocobalamin (VITAMIN B 12 PO) Take 1,000 mcg by mouth daily.    . ferrous sulfate (FERRO-BOB) 325 (65 FE) MG tablet Take 325 mg by mouth daily with breakfast.      . levothyroxine (SYNTHROID, LEVOTHROID) 100 MCG tablet take 1 tablet by mouth once daily  **LABS DUE IN SEPT 30 tablet 11  . lidocaine-prilocaine (EMLA) cream Apply 1 application topically as needed. Apply to port-a-cath site 1 to 2 hours prior to appt. 30 g 1  . losartan (COZAAR) 100 MG tablet Take 1 tablet (100 mg total) by mouth daily. 30 tablet 11   No current facility-administered medications for this visit.   Facility-Administered Medications Ordered in Other Visits  Medication Dose Route Frequency Provider Last Rate Last Dose  . heparin lock flush 100 unit/mL  500 Units Intravenous Once Wyatt Portela, MD      . sodium chloride 0.9 % injection 10 mL  10 mL Intravenous PRN Wyatt Portela, MD        Allergies: No Known Allergies  Physical Exam: Blood pressure 117/67, pulse 66, temperature 97.9 F (36.6 C), temperature source Oral, resp. rate 18, weight 218 lb 12.8 oz (99.247 kg), SpO2 100 %. ECOG: 1 General appearance: alert awake woman without distress. Head: Normocephalic, without obvious abnormality no oral ulcers or lesions. Neck: no adenopathy, no thyroid  masses. Lymph nodes: Cervical, supraclavicular, and axillary nodes normal. Heart:regular rate and rhythm, S1, S2 normal, no murmur, click, rub or gallop Lung:chest clear, no wheezing, rales, normal symmetric air entry Abdomin: soft, non-tender, without masses or organomegaly no rebound or guarding. EXT:no erythema, induration, or nodules   Lab Results: Lab Results  Component Value Date   WBC 4.1 08/30/2015   HGB 12.3  08/30/2015   HCT 37.0 08/30/2015   MCV 86.4 08/30/2015   PLT 242 08/30/2015   EXAM: CT ABDOMEN AND PELVIS WITHOUT CONTRAST  TECHNIQUE: Multidetector CT imaging of the abdomen and pelvis was performed following the standard protocol without IV contrast.  COMPARISON: CT abdomen pelvis 03/07/2014  FINDINGS: Lower chest: Lung bases clear without infiltrate or effusion. Heart size normal.  Hepatobiliary: The liver is normal in size and contour without focal lesion. Gallbladder and bile ducts normal.  Pancreas: Negative  Spleen: Negative  Adrenals/Urinary Tract: Negative for renal mass or obstruction. No renal calculi. Urinary bladder normal.  Stomach/Bowel: Mild to moderate hiatal hernia. No bowel obstruction. Prior bowel resection in the mid small bowel with surgical clips. This is similar to the prior study. Interval development of 2.5 cm cyst in the left abdomen at the level of the iliac crest. This is unchanged from the prior study and may represent a duplication cyst  Vascular/Lymphatic: Negative for atherosclerotic disease or aneurysm. No lymphadenopathy.  Reproductive: Prior hysterectomy.  Other: Ventral hernia containing omentum. This is unchanged from the prior study. No fluid or bowel in the hernia sac.  Musculoskeletal: Moderate disc degeneration and spurring at L5-S1. No acute bony abnormality.  IMPRESSION: No acute abnormality.  Negative for lymphoma recurrence.  Ventral hernia containing fat is unchanged  2.5 cm cyst in the left upper pelvis associated with bowel. 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     Impression and Plan:  This is a pleasant 70 year old female with the following issues:  1. Follicular lymphoma, stage IIA disease, presented with a large mesenteric mass. She is status post systemic chemotherapy completed in December 2011 and had been in remission since. She continues to be on active surveillance  without any evidence of recurrent disease. Her laboratory data, physical examination and CT scan obtained on 07/23/2015 were reviewed and showed no evidence of recurrent disease. The plan is to continue with active surveillance and physical examination and laboratory testing every 8 months. Imaging studies will be repeated as needed. 2. Port-A-Cath management.  We will flush her Port-A-Cath every 8 weeks. Risks and benefits of keeping her Port-A-Cath were reviewed today and she elects to keep it for the time being. 3.     Mild Anemia: Related to renal insufficiency which have improved at this time. Her hemoglobin is normal. 4.     Renal insufficiency: Likely related to hypertension. Her creatinine appears to be stable and close to baseline 5.     Follow up: in 8 months for a clinical visit.      [facility], MD 3/9/20178:39 AM

## 2015-08-30 NOTE — Telephone Encounter (Signed)
per pof to sch pt appt-gave pt copy of avs °

## 2015-10-24 ENCOUNTER — Other Ambulatory Visit: Payer: Self-pay

## 2015-10-25 ENCOUNTER — Ambulatory Visit (HOSPITAL_BASED_OUTPATIENT_CLINIC_OR_DEPARTMENT_OTHER): Payer: Medicare Other

## 2015-10-25 DIAGNOSIS — C8213 Follicular lymphoma grade II, intra-abdominal lymph nodes: Secondary | ICD-10-CM

## 2015-10-25 DIAGNOSIS — Z8572 Personal history of non-Hodgkin lymphomas: Secondary | ICD-10-CM | POA: Diagnosis not present

## 2015-10-25 DIAGNOSIS — Z452 Encounter for adjustment and management of vascular access device: Secondary | ICD-10-CM

## 2015-10-25 MED ORDER — HEPARIN SOD (PORK) LOCK FLUSH 100 UNIT/ML IV SOLN
500.0000 [IU] | Freq: Once | INTRAVENOUS | Status: AC | PRN
  Administered 2015-10-25: 500 [IU] via INTRAVENOUS
  Filled 2015-10-25: qty 5

## 2015-10-25 MED ORDER — SODIUM CHLORIDE 0.9 % IJ SOLN
10.0000 mL | INTRAMUSCULAR | Status: DC | PRN
  Administered 2015-10-25: 10 mL via INTRAVENOUS
  Filled 2015-10-25: qty 10

## 2015-11-20 ENCOUNTER — Telehealth: Payer: Self-pay

## 2015-11-20 NOTE — Telephone Encounter (Signed)
Patient is on the list for [employer] 2017 and may be a good candidate for an AWV in 2017. Due around August 2017

## 2015-12-19 ENCOUNTER — Other Ambulatory Visit: Payer: Self-pay | Admitting: *Deleted

## 2015-12-19 DIAGNOSIS — C8213 Follicular lymphoma grade II, intra-abdominal lymph nodes: Secondary | ICD-10-CM

## 2015-12-20 ENCOUNTER — Ambulatory Visit (HOSPITAL_BASED_OUTPATIENT_CLINIC_OR_DEPARTMENT_OTHER): Payer: Medicare Other

## 2015-12-20 VITALS — BP 107/60 | HR 73 | Temp 97.8°F | Resp 18

## 2015-12-20 DIAGNOSIS — Z8572 Personal history of non-Hodgkin lymphomas: Secondary | ICD-10-CM | POA: Diagnosis not present

## 2015-12-20 DIAGNOSIS — Z452 Encounter for adjustment and management of vascular access device: Secondary | ICD-10-CM | POA: Diagnosis not present

## 2015-12-20 DIAGNOSIS — C8213 Follicular lymphoma grade II, intra-abdominal lymph nodes: Secondary | ICD-10-CM

## 2015-12-20 MED ORDER — HEPARIN SOD (PORK) LOCK FLUSH 100 UNIT/ML IV SOLN
500.0000 [IU] | Freq: Once | INTRAVENOUS | Status: AC
  Administered 2015-12-20: 500 [IU]
  Filled 2015-12-20: qty 5

## 2015-12-20 MED ORDER — SODIUM CHLORIDE 0.9 % IJ SOLN
10.0000 mL | Freq: Once | INTRAMUSCULAR | Status: AC
  Administered 2015-12-20: 10 mL
  Filled 2015-12-20: qty 10

## 2016-01-08 ENCOUNTER — Telehealth: Payer: Self-pay

## 2016-01-08 NOTE — Telephone Encounter (Signed)
Call for AWV and no answer; Will try later.

## 2016-02-14 ENCOUNTER — Ambulatory Visit (HOSPITAL_BASED_OUTPATIENT_CLINIC_OR_DEPARTMENT_OTHER): Payer: Medicare Other

## 2016-02-14 DIAGNOSIS — Z8572 Personal history of non-Hodgkin lymphomas: Secondary | ICD-10-CM | POA: Diagnosis not present

## 2016-02-14 DIAGNOSIS — C8213 Follicular lymphoma grade II, intra-abdominal lymph nodes: Secondary | ICD-10-CM

## 2016-02-14 DIAGNOSIS — Z452 Encounter for adjustment and management of vascular access device: Secondary | ICD-10-CM

## 2016-02-14 MED ORDER — HEPARIN SOD (PORK) LOCK FLUSH 100 UNIT/ML IV SOLN
500.0000 [IU] | Freq: Once | INTRAVENOUS | Status: AC | PRN
  Administered 2016-02-14: 500 [IU] via INTRAVENOUS
  Filled 2016-02-14: qty 5

## 2016-02-14 MED ORDER — SODIUM CHLORIDE 0.9 % IJ SOLN
10.0000 mL | INTRAMUSCULAR | Status: DC | PRN
  Administered 2016-02-14: 10 mL via INTRAVENOUS
  Filled 2016-02-14: qty 10

## 2016-02-16 ENCOUNTER — Other Ambulatory Visit: Payer: Self-pay | Admitting: Internal Medicine

## 2016-02-19 ENCOUNTER — Other Ambulatory Visit: Payer: Self-pay | Admitting: Internal Medicine

## 2016-02-28 ENCOUNTER — Ambulatory Visit (INDEPENDENT_AMBULATORY_CARE_PROVIDER_SITE_OTHER): Payer: Medicare Other | Admitting: Women's Health

## 2016-02-28 ENCOUNTER — Encounter: Payer: Self-pay | Admitting: Women's Health

## 2016-02-28 VITALS — BP 126/80 | Ht 66.0 in | Wt 219.0 lb

## 2016-02-28 DIAGNOSIS — Z01419 Encounter for gynecological examination (general) (routine) without abnormal findings: Secondary | ICD-10-CM

## 2016-02-28 NOTE — Progress Notes (Signed)
Christine Morales 06-05-1946 [id number]    History:    Presents for breast and pelvic exam.  Doing well today. History of Non-Hodgkin Follicular Lymphoma Stage IIA (dx in 2011), completed chemo, Port-A-Cath still in place, followed by hematology/oncology. 01/2015 DEXA normal at primary care . History of normal mammograms and Paps. Total abdominal hysterectomy in 1982 secondary to leiomyomatous uteri. Endorses taking Vit. D supplement. 2008 normal  Colonoscopy.  Admits to some increased urinary frequency but denies urgency, burning with urination, hematuria, abdominal pain, pelvic pain, fevers/chills, or changes in bowel habits. Has received pneumonia vaccine, not zostavac.  Past medical history, past surgical history, family history and social history were all reviewed and documented in the EPIC chart. Husband passed away in  25-Sep-2022 from Multiple Myeloma. Two kids, grown and live here in [place], several grandchildren. Retired, previously worked in an office. Exercises several times a week at the [facility].  ROS:  A ROS was performed and pertinent positives and negatives are included.  Exam:  Vitals:   02/28/16 0941  BP: 126/80  Weight: 219 lb (99.3 kg)  Height: 5' 6"  (1.676 m)   Body mass index is 35.35 kg/m.   General appearance:  Normal Thyroid:  Symmetrical, normal in size, without palpable masses or nodularity. Respiratory  Auscultation:  Clear without wheezing or rhonchi Cardiovascular  Auscultation:  Regular rate, without rubs, murmurs or gallops  Edema/varicosities:  Not grossly evident Abdominal  Soft,nontender, without masses, guarding or rebound.  Liver/spleen:  No organomegaly noted  Hernia:  None appreciated  Skin  Inspection:  Grossly normal   Breasts: Examined lying and sitting  Bilateral breast reduction.  Port-A-Cath palpated beneath skin on right chest.     Right: Without masses, retractions, discharge or axillary adenopathy. Mammoplasty scar noted.     Left: Without masses,  retractions, discharge or axillary adenopathy. Mammoplasty scar noted. Gentitourinary   Inguinal/mons:  Normal without inguinal adenopathy  External genitalia:  Normal  BUS/Urethra/Skene's glands:  Normal  Vagina:  Normal. Mildly atrophic.  Cervix: Absent.  Uterus:  Absent  Adnexa/parametria:     Rt: Without masses or tenderness.   Lt: Without masses or tenderness.  Anus and perineum: Normal  Digital rectal exam: Normal sphincter tone without palpated masses or tenderness  Assessment/Plan:  70 y.o. WBF G2P2 for breast and pelvic exam without complaints.  TAH 2008 with ovarian conservation secondary to leiomyomatous uteri Non-Hodgkin Lymphoma, Stage IIA (2011) in remission -- followed by Dr. Rodena Piety Hypertension, hypothyroid, CRF -- followed by PCP labs and meds  Plan: Continue to follow-up with Dr. Rodena Piety for Port-A-Cath and lymphoma remission management. Discussed scheduling yearly mammogram with [facility]. Encouraged to get Shingles vaccine with PCP. Discussed scheduling colonoscopy for next year.  Discussed SBEs, importance of annual screening, exercise, fall precautions, and inclusion of leisure activities in daily life. Continue Vit. D supplement.    Huel Cote [facility], 10:17 AM 02/28/2016

## 2016-02-28 NOTE — Patient Instructions (Signed)
zostavac  Menopause is a normal process in which your reproductive ability comes to an end. This process happens gradually over a span of months to years, usually between the ages of 54 and 49. Menopause is complete when you have missed 12 consecutive menstrual periods. It is important to talk with your health care provider about some of the most common conditions that affect postmenopausal women, such as heart disease, cancer, and bone loss (osteoporosis). Adopting a healthy lifestyle and getting preventive care can help to promote your health and wellness. Those actions can also lower your chances of developing some of these common conditions. WHAT SHOULD I KNOW ABOUT MENOPAUSE? During menopause, you may experience a number of symptoms, such as:  Moderate-to-severe hot flashes.  Night sweats.  Decrease in sex drive.  Mood swings.  Headaches.  Tiredness.  Irritability.  Memory problems.  Insomnia. Choosing to treat or not to treat menopausal changes is an individual decision that you make with your health care provider. WHAT SHOULD I KNOW ABOUT HORMONE REPLACEMENT THERAPY AND SUPPLEMENTS? Hormone therapy products are effective for treating symptoms that are associated with menopause, such as hot flashes and night sweats. Hormone replacement carries certain risks, especially as you become older. If you are thinking about using estrogen or estrogen with progestin treatments, discuss the benefits and risks with your health care provider. WHAT SHOULD I KNOW ABOUT HEART DISEASE AND STROKE? Heart disease, heart attack, and stroke become more likely as you age. This may be due, in part, to the hormonal changes that your body experiences during menopause. These can affect how your body processes dietary fats, triglycerides, and cholesterol. Heart attack and stroke are both medical emergencies. There are many things that you can do to help prevent heart disease and stroke:  Have your blood  pressure checked at least every 1-2 years. High blood pressure causes heart disease and increases the risk of stroke.  If you are 71-79 years old, ask your health care provider if you should take aspirin to prevent a heart attack or a stroke.  Do not use any tobacco products, including cigarettes, chewing tobacco, or electronic cigarettes. If you need help quitting, ask your health care provider.  It is important to eat a healthy diet and maintain a healthy weight.  Be sure to include plenty of vegetables, fruits, low-fat dairy products, and lean protein.  Avoid eating foods that are high in solid fats, added sugars, or salt (sodium).  Get regular exercise. This is one of the most important things that you can do for your health.  Try to exercise for at least 150 minutes each week. The type of exercise that you do should increase your heart rate and make you sweat. This is known as moderate-intensity exercise.  Try to do strengthening exercises at least twice each week. Do these in addition to the moderate-intensity exercise.  Know your numbers.Ask your health care provider to check your cholesterol and your blood glucose. Continue to have your blood tested as directed by your health care provider. WHAT SHOULD I KNOW ABOUT CANCER SCREENING? There are several types of cancer. Take the following steps to reduce your risk and to catch any cancer development as early as possible. Breast Cancer  Practice breast self-awareness.  This means understanding how your breasts normally appear and feel.  It also means doing regular breast self-exams. Let your health care provider know about any changes, no matter how small.  If you are 79 or older, have a clinician  do a breast exam (clinical breast exam or CBE) every year. Depending on your age, family history, and medical history, it may be recommended that you also have a yearly breast X-ray (mammogram).  If you have a family history of breast  cancer, talk with your health care provider about genetic screening.  If you are at high risk for breast cancer, talk with your health care provider about having an MRI and a mammogram every year.  Breast cancer (BRCA) gene test is recommended for women who have family members with BRCA-related cancers. Results of the assessment will determine the need for genetic counseling and BRCA1 and for BRCA2 testing. BRCA-related cancers include these types:  Breast. This occurs in males or females.  Ovarian.  Tubal. This may also be called fallopian tube cancer.  Cancer of the abdominal or pelvic lining (peritoneal cancer).  Prostate.  Pancreatic. Cervical, Uterine, and Ovarian Cancer Your health care provider may recommend that you be screened regularly for cancer of the pelvic organs. These include your ovaries, uterus, and vagina. This screening involves a pelvic exam, which includes checking for microscopic changes to the surface of your cervix (Pap test).  For women ages 21-65, health care providers may recommend a pelvic exam and a Pap test every three years. For women ages 58-65, they may recommend the Pap test and pelvic exam, combined with testing for human papilloma virus (HPV), every five years. Some types of HPV increase your risk of cervical cancer. Testing for HPV may also be done on women of any age who have unclear Pap test results.  Other health care providers may not recommend any screening for nonpregnant women who are considered low risk for pelvic cancer and have no symptoms. Ask your health care provider if a screening pelvic exam is right for you.  If you have had past treatment for cervical cancer or a condition that could lead to cancer, you need Pap tests and screening for cancer for at least 20 years after your treatment. If Pap tests have been discontinued for you, your risk factors (such as having a new sexual partner) need to be reassessed to determine if you should start  having screenings again. Some women have medical problems that increase the chance of getting cervical cancer. In these cases, your health care provider may recommend that you have screening and Pap tests more often.  If you have a family history of uterine cancer or ovarian cancer, talk with your health care provider about genetic screening.  If you have vaginal bleeding after reaching menopause, tell your health care provider.  There are currently no reliable tests available to screen for ovarian cancer. Lung Cancer Lung cancer screening is recommended for adults 12-81 years old who are at high risk for lung cancer because of a history of smoking. A yearly low-dose CT scan of the lungs is recommended if you:  Currently smoke.  Have a history of at least 30 pack-years of smoking and you currently smoke or have quit within the past 15 years. A pack-year is smoking an average of one pack of cigarettes per day for one year. Yearly screening should:  Continue until it has been 15 years since you quit.  Stop if you develop a health problem that would prevent you from having lung cancer treatment. Colorectal Cancer  This type of cancer can be detected and can often be prevented.  Routine colorectal cancer screening usually begins at age 42 and continues through age 53.  If  you have risk factors for colon cancer, your health care provider may recommend that you be screened at an earlier age.  If you have a family history of colorectal cancer, talk with your health care provider about genetic screening.  Your health care provider may also recommend using home test kits to check for hidden blood in your stool.  A small camera at the end of a tube can be used to examine your colon directly (sigmoidoscopy or colonoscopy). This is done to check for the earliest forms of colorectal cancer.  Direct examination of the colon should be repeated every 5-10 years until age 53. However, if early forms  of precancerous polyps or small growths are found or if you have a family history or genetic risk for colorectal cancer, you may need to be screened more often. Skin Cancer  Check your skin from head to toe regularly.  Monitor any moles. Be sure to tell your health care provider:  About any new moles or changes in moles, especially if there is a change in a mole's shape or color.  If you have a mole that is larger than the size of a pencil eraser.  If any of your family members has a history of skin cancer, especially at a young age, talk with your health care provider about genetic screening.  Always use sunscreen. Apply sunscreen liberally and repeatedly throughout the day.  Whenever you are outside, protect yourself by wearing long sleeves, pants, a wide-brimmed hat, and sunglasses. WHAT SHOULD I KNOW ABOUT OSTEOPOROSIS? Osteoporosis is a condition in which bone destruction happens more quickly than new bone creation. After menopause, you may be at an increased risk for osteoporosis. To help prevent osteoporosis or the bone fractures that can happen because of osteoporosis, the following is recommended:  If you are 52-1 years old, get at least 1,000 mg of calcium and at least 600 mg of vitamin D per day.  If you are older than age 51 but younger than age 31, get at least 1,200 mg of calcium and at least 600 mg of vitamin D per day.  If you are older than age 2, get at least 1,200 mg of calcium and at least 800 mg of vitamin D per day. Smoking and excessive alcohol intake increase the risk of osteoporosis. Eat foods that are rich in calcium and vitamin D, and do weight-bearing exercises several times each week as directed by your health care provider. WHAT SHOULD I KNOW ABOUT HOW MENOPAUSE AFFECTS [place]? Depression may occur at any age, but it is more common as you become older. Common symptoms of depression include:  Low or sad mood.  Changes in sleep  patterns.  Changes in appetite or eating patterns.  Feeling an overall lack of motivation or enjoyment of activities that you previously enjoyed.  Frequent crying spells. Talk with your health care provider if you think that you are experiencing depression. WHAT SHOULD I KNOW ABOUT IMMUNIZATIONS? It is important that you get and maintain your immunizations. These include:  Tetanus, diphtheria, and pertussis (Tdap) booster vaccine.  Influenza every year before the flu season begins.  Pneumonia vaccine.  Shingles vaccine. Your health care provider may also recommend other immunizations.   This information is not intended to replace advice given to you by your health care provider. Make sure you discuss any questions you have with your health care provider.   Document Released: 08/01/2005 Document Revised: 06/30/2014 Document Reviewed: 02/09/2014 Elsevier Interactive Patient Education  2016 [place].

## 2016-03-19 LAB — HM MAMMOGRAPHY

## 2016-03-20 ENCOUNTER — Encounter: Payer: Self-pay | Admitting: Internal Medicine

## 2016-04-10 ENCOUNTER — Ambulatory Visit (HOSPITAL_BASED_OUTPATIENT_CLINIC_OR_DEPARTMENT_OTHER): Payer: Medicare Other

## 2016-04-10 DIAGNOSIS — Z452 Encounter for adjustment and management of vascular access device: Secondary | ICD-10-CM | POA: Diagnosis not present

## 2016-04-10 DIAGNOSIS — Z8572 Personal history of non-Hodgkin lymphomas: Secondary | ICD-10-CM | POA: Diagnosis not present

## 2016-04-10 DIAGNOSIS — C8213 Follicular lymphoma grade II, intra-abdominal lymph nodes: Secondary | ICD-10-CM

## 2016-04-10 MED ORDER — HEPARIN SOD (PORK) LOCK FLUSH 100 UNIT/ML IV SOLN
500.0000 [IU] | Freq: Once | INTRAVENOUS | Status: AC | PRN
  Administered 2016-04-10: 500 [IU] via INTRAVENOUS
  Filled 2016-04-10: qty 5

## 2016-04-10 MED ORDER — SODIUM CHLORIDE 0.9 % IJ SOLN
10.0000 mL | INTRAMUSCULAR | Status: DC | PRN
  Administered 2016-04-10: 10 mL via INTRAVENOUS
  Filled 2016-04-10: qty 10

## 2016-04-15 ENCOUNTER — Ambulatory Visit (INDEPENDENT_AMBULATORY_CARE_PROVIDER_SITE_OTHER): Payer: Medicare Other

## 2016-04-15 ENCOUNTER — Ambulatory Visit (INDEPENDENT_AMBULATORY_CARE_PROVIDER_SITE_OTHER): Payer: Medicare Other | Admitting: Podiatry

## 2016-04-15 ENCOUNTER — Encounter: Payer: Self-pay | Admitting: Podiatry

## 2016-04-15 VITALS — BP 145/81 | HR 79 | Resp 16 | Ht 66.0 in | Wt 219.0 lb

## 2016-04-15 DIAGNOSIS — M79671 Pain in right foot: Secondary | ICD-10-CM | POA: Diagnosis not present

## 2016-04-15 DIAGNOSIS — M722 Plantar fascial fibromatosis: Secondary | ICD-10-CM

## 2016-04-15 DIAGNOSIS — M79672 Pain in left foot: Secondary | ICD-10-CM

## 2016-04-15 MED ORDER — METHYLPREDNISOLONE 4 MG PO TBPK: ORAL_TABLET | ORAL | 0 refills | Status: DC

## 2016-04-15 MED ORDER — MELOXICAM 15 MG PO TABS: 15.0000 mg | ORAL_TABLET | Freq: Every day | ORAL | 3 refills | Status: DC

## 2016-04-15 NOTE — Patient Instructions (Signed)

## 2016-04-15 NOTE — Progress Notes (Signed)
   Subjective:    Patient ID: Christine Morales, female    DOB: [birth year], 70 y.o.   MRN: [id number]  HPI: She presents a chief complaint of bilateral foot pain. States the left feet hurt all over as she refers to the plantar aspect and had been doing so for the past 3 years. She denies any trauma states that the pain sort of comes and goes but recently she states it has been getting worse.    Review of Systems  All other systems reviewed and are negative.      Objective:   Physical Exam: Vital signs are stable she is alert and oriented 3. Pulses are strongly palpable. Neurologic sensorium is intact. Deep tendon reflexes are intact bilaterally muscle strength is 5 over 5 dorsiflexion plantar flexors and inverters everters all of his musculature is intact. Orthopedic evaluation and strict pain on palpation medially continued tubercles bilateral. Radiographic evaluation 3 views of the bilateral foot taken today demonstrate osseously mature individual with soft tissue increase in density at plantar fascia calcaneal insertion site and plantar calcaneal heel spurs present. Cutaneous evaluation does not demonstrate any signs of open lesions or infections.         Assessment & Plan:  Plantar fasciitis bilateral.  Plan: Discussed etiology pathology conservative versus surgical therapies. Started her on Medrol Dosepak to be followed by meloxicam. Injected the bilateral heels today with Kenalog and local anesthetic layer fascial braces dispensed bilaterally and a single night splint. Discussed appropriate shoe gear stretching exercises ice therapy and shoe gear modifications area or follow-up with her in 1 month.

## 2016-05-13 ENCOUNTER — Encounter: Payer: Self-pay | Admitting: Podiatry

## 2016-05-13 ENCOUNTER — Ambulatory Visit (INDEPENDENT_AMBULATORY_CARE_PROVIDER_SITE_OTHER): Payer: Medicare Other | Admitting: Podiatry

## 2016-05-13 DIAGNOSIS — M722 Plantar fascial fibromatosis: Secondary | ICD-10-CM

## 2016-05-13 DIAGNOSIS — L600 Ingrowing nail: Secondary | ICD-10-CM

## 2016-05-13 MED ORDER — NEOMYCIN-POLYMYXIN-HC 1 % OT SOLN: OTIC | 1 refills | Status: DC

## 2016-05-13 NOTE — Patient Instructions (Signed)

## 2016-05-13 NOTE — Progress Notes (Signed)
She presents today for follow-up of her plantar fasciitis bilateral heels she states that the left was doing great the right one is approximately 50-60% improved. She states that she has an ingrown toenail to the lateral border of the hallux right.  Objective: Vital signs are stable she is alert and oriented 3. Pulses are palpable. Neurologic sensorium is intact. No pain on palpation medially located with a left heel with the right heel is still exquisitely tender. No calf pain pulses remain palpable bilateral. She has ingrown toenail fibular border of the hallux right secondary to hallux abductovalgus deformity of the right foot.  Assessment: Ingrown nail tibial border hallux right. Resolved plantar fasciitis left 50-60% improvement plantar fasciitis right.  Plan: Perform a chemical matrixectomy today after local anesthesia was administered. She tolerated the procedure well. She is provided with both ulnar and home going instructions for care and soaking of the toe as well as a prescription for Cortisporin Otic to be applied twice daily after soaking. I also reinjected her right heel today with Kenalog and local anesthetic I will follow up with her in 1-2 weeks.

## 2016-05-17 ENCOUNTER — Other Ambulatory Visit: Payer: Self-pay | Admitting: Internal Medicine

## 2016-05-22 ENCOUNTER — Other Ambulatory Visit (HOSPITAL_BASED_OUTPATIENT_CLINIC_OR_DEPARTMENT_OTHER): Payer: Medicare Other

## 2016-05-22 ENCOUNTER — Ambulatory Visit (HOSPITAL_BASED_OUTPATIENT_CLINIC_OR_DEPARTMENT_OTHER): Payer: Medicare Other | Admitting: Oncology

## 2016-05-22 ENCOUNTER — Ambulatory Visit (HOSPITAL_BASED_OUTPATIENT_CLINIC_OR_DEPARTMENT_OTHER): Payer: Medicare Other

## 2016-05-22 ENCOUNTER — Telehealth: Payer: Self-pay | Admitting: Oncology

## 2016-05-22 VITALS — BP 133/76 | HR 67 | Temp 97.9°F | Resp 18 | Ht 66.0 in | Wt 220.0 lb

## 2016-05-22 DIAGNOSIS — N289 Disorder of kidney and ureter, unspecified: Secondary | ICD-10-CM | POA: Diagnosis not present

## 2016-05-22 DIAGNOSIS — Z8572 Personal history of non-Hodgkin lymphomas: Secondary | ICD-10-CM

## 2016-05-22 DIAGNOSIS — C8233 Follicular lymphoma grade IIIa, intra-abdominal lymph nodes: Secondary | ICD-10-CM

## 2016-05-22 DIAGNOSIS — Z452 Encounter for adjustment and management of vascular access device: Secondary | ICD-10-CM

## 2016-05-22 DIAGNOSIS — C8213 Follicular lymphoma grade II, intra-abdominal lymph nodes: Secondary | ICD-10-CM

## 2016-05-22 LAB — CBC WITH DIFFERENTIAL/PLATELET
BASO%: 0.4 % (ref 0.0–2.0)
Basophils Absolute: 0 10*3/uL (ref 0.0–0.1)
EOS ABS: 0.1 10*3/uL (ref 0.0–0.5)
EOS%: 1.3 % (ref 0.0–7.0)
HCT: 37.4 % (ref 34.8–46.6)
HEMOGLOBIN: 12.3 g/dL (ref 11.6–15.9)
LYMPH%: 44.9 % (ref 14.0–49.7)
MCH: 28 pg (ref 25.1–34.0)
MCHC: 32.9 g/dL (ref 31.5–36.0)
MCV: 85.2 fL (ref 79.5–101.0)
MONO#: 0.4 10*3/uL (ref 0.1–0.9)
MONO%: 8.3 % (ref 0.0–14.0)
NEUT%: 45.1 % (ref 38.4–76.8)
NEUTROS ABS: 2.3 10*3/uL (ref 1.5–6.5)
PLATELETS: 226 10*3/uL (ref 145–400)
RBC: 4.39 10*6/uL (ref 3.70–5.45)
RDW: 14.8 % — AB (ref 11.2–14.5)
WBC: 5.2 10*3/uL (ref 3.9–10.3)
lymph#: 2.3 10*3/uL (ref 0.9–3.3)

## 2016-05-22 LAB — COMPREHENSIVE METABOLIC PANEL
ALBUMIN: 3.4 g/dL — AB (ref 3.5–5.0)
ALK PHOS: 92 U/L (ref 40–150)
ALT: 13 U/L (ref 0–55)
AST: 15 U/L (ref 5–34)
Anion Gap: 9 mEq/L (ref 3–11)
BILIRUBIN TOTAL: 0.5 mg/dL (ref 0.20–1.20)
BUN: 26.8 mg/dL — ABNORMAL HIGH (ref 7.0–26.0)
CO2: 24 mEq/L (ref 22–29)
CREATININE: 1.9 mg/dL — AB (ref 0.6–1.1)
Calcium: 9.5 mg/dL (ref 8.4–10.4)
Chloride: 108 mEq/L (ref 98–109)
EGFR: 31 mL/min/{1.73_m2} — AB (ref >90)
GLUCOSE: 99 mg/dL (ref 70–140)
Potassium: 4.3 mEq/L (ref 3.5–5.1)
SODIUM: 141 meq/L (ref 136–145)
TOTAL PROTEIN: 6.7 g/dL (ref 6.4–8.3)

## 2016-05-22 MED ORDER — SODIUM CHLORIDE 0.9 % IJ SOLN
10.0000 mL | INTRAMUSCULAR | Status: DC | PRN
  Administered 2016-05-22: 10 mL via INTRAVENOUS
  Filled 2016-05-22: qty 10

## 2016-05-22 MED ORDER — HEPARIN SOD (PORK) LOCK FLUSH 100 UNIT/ML IV SOLN
500.0000 [IU] | Freq: Once | INTRAVENOUS | Status: AC | PRN
  Administered 2016-05-22: 500 [IU] via INTRAVENOUS
  Filled 2016-05-22: qty 5

## 2016-05-22 NOTE — Telephone Encounter (Signed)
Appointments scheduled per 05/22/16 los. A copy of the AVS report and appointment schedule was given to patient, per 05/22/16 los. °

## 2016-05-22 NOTE — Progress Notes (Signed)
Hematology and Oncology Follow Up Visit  GERI URSIN [id number] 09/16/1945 70 y.o. 05/22/2016 8:40 AM  Principle Diagnosis: 70 year old female with follicular lymphoma, non-Hodgkin type, stage IIA, presented with a large mesenteric mass in July 2011.  Prior Therapy: She is S/P 5 cycles of bendamustine and rituximab, finished in December 2011. She had a complete response at that time. 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  Current therapy: Observation and surveillance.   Interim History:  Christine Morales presents today for a followup visit. Since the last visit, she is doing well without any major changes in her health. She does not report any lymphadenopathy or petechiae. She has not reported any constitutional symptoms of fevers, chills or weight loss. She is able to drive and attends to activities of daily living. She does not report any complication related to her Port-A-Cath. She continues to be active and attends to activities of daily living. She denied any hospitalization or illnesses. She does not report any abdominal pain or distention.  She did not report any nausea, did not report any vomiting. She did not report any fevers, did not report any chills. She has not reported any headaches or blurry vision or double vision. Has not reported any lymphadenopathy or petechiae. Has not reported any chest pain or shortness of breath. Has not reported any cough or hemoptysis. Is not reporting any change in her bowel habits. Her rest of review of systems was unremarkable.  Medications: I have reviewed the patient's current medications. No change by my review. Current Outpatient Prescriptions  Medication Sig Dispense Refill  . amLODipine (NORVASC) 5 MG tablet take 1 tablet by mouth once daily 90 tablet 1  . Aspirin-Salicylamide-Caffeine (BC HEADACHE PO) Take 1 packet by  mouth daily as needed (headache).    . Cholecalciferol (VITAMIN D3) 1000 UNITS tablet Take 1,000 Units by mouth daily.      . Cyanocobalamin (VITAMIN B 12 PO) Take 1,000 mcg by mouth daily.    . ferrous sulfate (FERRO-BOB) 325 (65 FE) MG tablet Take 325 mg by mouth daily with breakfast.      . levothyroxine (SYNTHROID, LEVOTHROID) 100 MCG tablet take 1 tablet by mouth once daily   30 tablet 11  . lidocaine-prilocaine (EMLA) cream Apply 1 application topically as needed. Apply to port-a-cath site 1 to 2 hours prior to appt. 30 g 1  . losartan (COZAAR) 100 MG tablet take 1 tablet by mouth once daily 90 tablet 1  . meloxicam (MOBIC) 15 MG tablet Take 1 tablet (15 mg total) by mouth daily. 30 tablet 3  . NEOMYCIN-POLYMYXIN-HYDROCORTISONE (CORTISPORIN) 1 % SOLN otic solution Apply 1-2 drops to toe BID after soaking 10 mL 1   No current facility-administered medications for this visit.    Facility-Administered Medications Ordered in Other Visits  Medication Dose Route Frequency Provider Last Rate Last Dose  . heparin lock flush 100 unit/mL  500 Units Intravenous Once Wyatt Portela, MD      . sodium chloride 0.9 % injection 10 mL  10 mL Intravenous PRN Wyatt Portela, MD        Allergies: No Known Allergies  Physical Exam: Blood pressure 133/76, pulse 67, temperature 97.9 F (36.6 C), temperature source Oral, resp. rate 18, height 5\' 6"  (1.676 m), weight 220 lb (99.8 kg), SpO2 100 %. ECOG: 1 General appearance: Well-appearing woman without distress.  Head: Normocephalic, without obvious abnormality no thrush noted. Neck: no adenopathy, no thyroid masses. Lymph nodes: Cervical, supraclavicular, and axillary nodes normal. Heart:regular rate and rhythm, S1, S2 normal, no murmur, click, rub or gallop Lung:chest clear, no wheezing, rales, normal symmetric air entry Abdomin: soft, non-tender, without masses or organomegaly no shifting dullness or ascites. EXT:no erythema, induration, or  nodules   Lab Results: Lab Results  Component Value Date   WBC 5.2 05/22/2016   HGB 12.3 05/22/2016   HCT 37.4 05/22/2016   MCV 85.2 05/22/2016   PLT 226 05/22/2016     IMPRESSION: No acute abnormality.  Negative for lymphoma recurrence.  Ventral hernia containing fat is unchanged  2.5 cm cyst in the left upper pelvis associated with bowel. 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     Impression and Plan:   70 year old female with the following issues:  1. Follicular lymphoma, stage IIA disease, presented with a large mesenteric mass. She is status post systemic chemotherapy completed in December 2011 and had been in remission since. Physical examination and laboratory data do not suggest any recurrent disease. Her last CT scan in January 2017 did not show any evidence to suggest recurrent disease. The plan is to continue with clinical surveillance every 6 months and repeat imaging studies if she develops any symptoms. 2. Port-A-Cath management.  We will flush her Port-A-Cath every 8 weeks. Risks and benefits of keeping her Port-A-Cath were reviewed today and she elects to keep it for the time being. 3.     Mild Anemia: Resolved at this time with a normal hemoglobin. 4.     Renal insufficiency: Likely related to hypertension. Her creatinine appears to be stable and close to baseline 5.     Follow up: in 6 months for a clinical visit.      [facility], MD 11/30/20178:40 AM

## 2016-05-27 ENCOUNTER — Encounter: Payer: Self-pay | Admitting: Podiatry

## 2016-05-27 ENCOUNTER — Ambulatory Visit (INDEPENDENT_AMBULATORY_CARE_PROVIDER_SITE_OTHER): Payer: Medicare Other | Admitting: Podiatry

## 2016-05-27 DIAGNOSIS — L600 Ingrowing nail: Secondary | ICD-10-CM

## 2016-05-27 DIAGNOSIS — M722 Plantar fascial fibromatosis: Secondary | ICD-10-CM

## 2016-05-27 NOTE — Patient Instructions (Signed)

## 2016-05-28 NOTE — Progress Notes (Signed)
She presents today for followup of her matrixectomy right foot. She also presents for plantar fasciitis bilateral. She states that she continues to soak her toe regularly but still gets sharp pains in the right great toe occasionally   Objective:Vital signs are stable she is alert and oriented x3. She has no pain on palpation medial calcaneal tubercle bilateral. No erythema cellulitis drainage or odor at the site of the matrixectomy to the right hallux. No signs of infection.  Assessment: No signs of infection well-healing matrixectomy. Resolving plantar fasciitis.  Plan: Recommended that she continue to soak in Epsom salts and water and to completely resolved. She should continue the Cortisporin Otic drops and cover during the daytime and leave open at bedtime. I will followup with her in 4 weeks if necessary for plantar fasciitis. He will call sooner with questions or concerns.

## 2016-06-24 ENCOUNTER — Ambulatory Visit: Payer: Medicare Other | Admitting: Podiatry

## 2016-07-18 ENCOUNTER — Other Ambulatory Visit: Payer: Self-pay | Admitting: Oncology

## 2016-07-22 ENCOUNTER — Ambulatory Visit (HOSPITAL_BASED_OUTPATIENT_CLINIC_OR_DEPARTMENT_OTHER): Payer: Medicare Other

## 2016-07-22 VITALS — BP 102/57 | HR 66 | Temp 98.0°F | Resp 20

## 2016-07-22 DIAGNOSIS — Z452 Encounter for adjustment and management of vascular access device: Secondary | ICD-10-CM

## 2016-07-22 DIAGNOSIS — Z8572 Personal history of non-Hodgkin lymphomas: Secondary | ICD-10-CM

## 2016-07-22 DIAGNOSIS — C8213 Follicular lymphoma grade II, intra-abdominal lymph nodes: Secondary | ICD-10-CM

## 2016-07-22 MED ORDER — HEPARIN SOD (PORK) LOCK FLUSH 100 UNIT/ML IV SOLN
500.0000 [IU] | Freq: Once | INTRAVENOUS | Status: AC | PRN
  Administered 2016-07-22: 500 [IU] via INTRAVENOUS
  Filled 2016-07-22: qty 5

## 2016-07-22 MED ORDER — SODIUM CHLORIDE 0.9 % IJ SOLN
10.0000 mL | INTRAMUSCULAR | Status: DC | PRN
  Administered 2016-07-22: 10 mL via INTRAVENOUS
  Filled 2016-07-22: qty 10

## 2016-08-11 ENCOUNTER — Other Ambulatory Visit (INDEPENDENT_AMBULATORY_CARE_PROVIDER_SITE_OTHER): Payer: Medicare Other

## 2016-08-11 ENCOUNTER — Ambulatory Visit (INDEPENDENT_AMBULATORY_CARE_PROVIDER_SITE_OTHER): Payer: Medicare Other | Admitting: Internal Medicine

## 2016-08-11 ENCOUNTER — Encounter: Payer: Self-pay | Admitting: Internal Medicine

## 2016-08-11 DIAGNOSIS — C8233 Follicular lymphoma grade IIIa, intra-abdominal lymph nodes: Secondary | ICD-10-CM

## 2016-08-11 DIAGNOSIS — R109 Unspecified abdominal pain: Secondary | ICD-10-CM | POA: Diagnosis not present

## 2016-08-11 DIAGNOSIS — N189 Chronic kidney disease, unspecified: Secondary | ICD-10-CM | POA: Diagnosis not present

## 2016-08-11 DIAGNOSIS — E538 Deficiency of other specified B group vitamins: Secondary | ICD-10-CM

## 2016-08-11 DIAGNOSIS — R10A0 Flank pain, unspecified side: Secondary | ICD-10-CM

## 2016-08-11 LAB — CBC WITH DIFFERENTIAL/PLATELET
Basophils Absolute: 0 10*3/uL (ref 0.0–0.1)
Basophils Relative: 0.5 % (ref 0.0–3.0)
EOS PCT: 3 % (ref 0.0–5.0)
Eosinophils Absolute: 0.2 10*3/uL (ref 0.0–0.7)
HEMATOCRIT: 38.3 % (ref 36.0–46.0)
HEMOGLOBIN: 12.8 g/dL (ref 12.0–15.0)
LYMPHS PCT: 41.2 % (ref 12.0–46.0)
Lymphs Abs: 2.1 10*3/uL (ref 0.7–4.0)
MCHC: 33.4 g/dL (ref 30.0–36.0)
MCV: 85.1 fl (ref 78.0–100.0)
MONO ABS: 0.5 10*3/uL (ref 0.1–1.0)
Monocytes Relative: 9.9 % (ref 3.0–12.0)
Neutro Abs: 2.3 10*3/uL (ref 1.4–7.7)
Neutrophils Relative %: 45.4 % (ref 43.0–77.0)
Platelets: 281 10*3/uL (ref 150.0–400.0)
RBC: 4.5 Mil/uL (ref 3.87–5.11)
RDW: 13.9 % (ref 11.5–15.5)
WBC: 5 10*3/uL (ref 4.0–10.5)

## 2016-08-11 LAB — BASIC METABOLIC PANEL
BUN: 18 mg/dL (ref 6–23)
CALCIUM: 9.6 mg/dL (ref 8.4–10.5)
CO2: 28 meq/L (ref 19–32)
Chloride: 107 mEq/L (ref 96–112)
Creatinine, Ser: 1.65 mg/dL — ABNORMAL HIGH (ref 0.40–1.20)
GFR: 39.74 mL/min — ABNORMAL LOW (ref >60.00)
GLUCOSE: 97 mg/dL (ref 70–99)
POTASSIUM: 4.2 meq/L (ref 3.5–5.1)
SODIUM: 143 meq/L (ref 135–145)

## 2016-08-11 LAB — URINALYSIS, ROUTINE W REFLEX MICROSCOPIC
BILIRUBIN URINE: NEGATIVE
HGB URINE DIPSTICK: NEGATIVE
Ketones, ur: NEGATIVE
NITRITE: NEGATIVE
RBC / HPF: NONE SEEN (ref 0–?)
Specific Gravity, Urine: 1.015 (ref 1.000–1.030)
TOTAL PROTEIN, URINE-UPE24: NEGATIVE
URINE GLUCOSE: NEGATIVE
UROBILINOGEN UA: 0.2 (ref 0.0–1.0)
pH: 6 (ref 5.0–8.0)

## 2016-08-11 LAB — VITAMIN B12: Vitamin B-12: 1278 pg/mL — ABNORMAL HIGH (ref 211–911)

## 2016-08-11 LAB — SEDIMENTATION RATE: Sed Rate: 28 mm/hr (ref 0–30)

## 2016-08-11 MED ORDER — CEFUROXIME AXETIL 250 MG PO TABS: 250.0000 mg | ORAL_TABLET | Freq: Two times a day (BID) | ORAL | 0 refills | Status: AC

## 2016-08-11 NOTE — Assessment & Plan Note (Addendum)
2/18 R flank pain ?etiology Labs CT if needed

## 2016-08-11 NOTE — Assessment & Plan Note (Signed)
Taking po Vit B12

## 2016-08-11 NOTE — Progress Notes (Signed)
Pre-visit discussion using our clinic review tool. No additional management support is needed unless otherwise documented below in the visit note.  

## 2016-08-11 NOTE — Progress Notes (Signed)
Subjective:  Patient ID: Christine Morales, female    DOB: [birth year]  Age: 71 y.o. MRN: [id number]  CC: Back Pain (Lower back rib pain right side, minor pain, moving makes it worse, popping sound, light pain)   Back Pain  Pertinent negatives include no abdominal pain, headaches, numbness or weakness.   Christine Morales presents for R flank pain x 3 d - dull   ROS Review of Systems  Constitutional: Negative for activity change, appetite change, chills, fatigue and unexpected weight change.  HENT: Negative for congestion, mouth sores and sinus pressure.   Eyes: Negative for visual disturbance.  Respiratory: Negative for cough and chest tightness.   Gastrointestinal: Negative for abdominal pain and nausea.  Genitourinary: Negative for difficulty urinating, frequency and vaginal pain.  Musculoskeletal: Positive for back pain. Negative for gait problem.  Skin: Negative for pallor and rash.  Neurological: Negative for dizziness, tremors, weakness, numbness and headaches.  Psychiatric/Behavioral: Negative for confusion and sleep disturbance.    Objective:  BP 124/72   Pulse 83   Temp 98 F (36.7 C) (Oral)   Resp 16   Ht 5\' 6"  (1.676 m)   Wt 223 lb 8 oz (101.4 kg)   SpO2 98%   BMI 36.07 kg/m   BP Readings from Last 3 Encounters:  08/11/16 124/72  07/22/16 (!) 102/57  05/22/16 133/76    Wt Readings from Last 3 Encounters:  08/11/16 223 lb 8 oz (101.4 kg)  05/22/16 220 lb (99.8 kg)  04/15/16 219 lb (99.3 kg)    Physical Exam  Constitutional: She appears well-developed. No distress.  HENT:  Head: Normocephalic.  Right Ear: External ear normal.  Left Ear: External ear normal.  Nose: Nose normal.  Mouth/Throat: Oropharynx is clear and moist.  Eyes: Conjunctivae are normal. Pupils are equal, round, and reactive to light. Right eye exhibits no discharge. Left eye exhibits no discharge.  Neck: Normal range of motion. Neck supple. No JVD present. No tracheal deviation present. No  thyromegaly present.  Cardiovascular: Normal rate, regular rhythm and normal heart sounds.   Pulmonary/Chest: No stridor. No respiratory distress. She has no wheezes.  Abdominal: Soft. Bowel sounds are normal. She exhibits no distension and no mass. There is no tenderness. There is no rebound and no guarding.  Musculoskeletal: She exhibits no edema or tenderness.  Lymphadenopathy:    She has no cervical adenopathy.  Neurological: She displays normal reflexes. No cranial nerve deficit. She exhibits normal muscle tone. Coordination normal.  Skin: No rash noted. No erythema.  Psychiatric: She has a normal mood and affect. Her behavior is normal. Judgment and thought content normal.  Obese Port on the R   Ct Abdomen Pelvis Wo Contrast  Result Date: 07/23/2015 CLINICAL DATA:  History of lymphoma.  Left lower quadrant pain EXAM: CT ABDOMEN AND PELVIS WITHOUT CONTRAST TECHNIQUE: Multidetector CT imaging of the abdomen and pelvis was performed following the standard protocol without IV contrast. COMPARISON:  CT abdomen pelvis 03/07/2014 FINDINGS: Lower chest: Lung bases clear without infiltrate or effusion. Heart size normal. Hepatobiliary: The liver is normal in size and contour without focal lesion. Gallbladder and bile ducts normal. Pancreas: Negative Spleen: Negative Adrenals/Urinary Tract: Negative for renal mass or obstruction. No renal calculi. Urinary bladder normal. Stomach/Bowel: Mild to moderate hiatal hernia. No bowel obstruction. Prior bowel resection in the mid small bowel with surgical clips. This is similar to the prior study. Interval development of 2.5 cm cyst in the left abdomen at the level  of the iliac crest. This is unchanged from the prior study and may represent a duplication cyst Vascular/Lymphatic: Negative for atherosclerotic disease or aneurysm. No lymphadenopathy. Reproductive: Prior hysterectomy. Other: Ventral hernia containing omentum. This is unchanged from the prior study.  No fluid or bowel in the hernia sac. Musculoskeletal: Moderate disc degeneration and spurring at L5-S1. No acute bony abnormality. IMPRESSION: No acute abnormality. Negative for lymphoma recurrence. Ventral hernia containing fat is unchanged 2.5 cm cyst in the left upper pelvis associated with bowel. 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 Electronically Signed   By: Franchot Gallo M.D.   On: 07/23/2015 11:27    Assessment & Plan:   Shamya was seen today for back pain.  Diagnoses and all orders for this visit:  Flank pain -     Urinalysis; Future -     CBC with Differential/Platelet; Future -     Basic metabolic panel; Future -     Sedimentation rate; Future  B12 deficiency -     CBC with Differential/Platelet; Future -     Vitamin B12; Future  Chronic renal failure, unspecified CKD stage  Follicular lymphoma grade IIIa of intra-abdominal lymph nodes (HCC)  Other orders -     cefUROXime (CEFTIN) 250 MG tablet; Take 1 tablet (250 mg total) by mouth 2 (two) times daily.   I have discontinued Ms. Zermeno meloxicam and NEOMYCIN-POLYMYXIN-HYDROCORTISONE. I am also having her start on cefUROXime. Additionally, I am having her maintain her ferrous sulfate, cholecalciferol, Aspirin-Salicylamide-Caffeine (BC HEADACHE PO), Cyanocobalamin (VITAMIN B 12 PO), losartan, amLODipine, levothyroxine, and lidocaine-prilocaine.  Meds ordered this encounter  Medications  . cefUROXime (CEFTIN) 250 MG tablet    Sig: Take 1 tablet (250 mg total) by mouth 2 (two) times daily.    Dispense:  14 tablet    Refill:  0     Follow-up: Return in about 2 weeks (around 08/25/2016) for a follow-up visit.  Walker Kehr, MD

## 2016-08-11 NOTE — Assessment & Plan Note (Signed)
Labs

## 2016-08-11 NOTE — Progress Notes (Signed)
Subjective:  Patient ID: Christine Morales, female    DOB: [birth year]  Age: 71 y.o. MRN: [id number]  CC: Back Pain (Lower back rib pain right side, minor pain, moving makes it worse, popping sound, light pain)   HPI Christine Morales presents for R flank pain x 3 d - dull   ROS Review of Systems  Constitutional: Negative for activity change, appetite change, chills, fatigue and unexpected weight change.  HENT: Negative for congestion, mouth sores and sinus pressure.   Eyes: Negative for visual disturbance.  Respiratory: Negative for cough and chest tightness.   Gastrointestinal: Negative for abdominal pain and nausea.  Genitourinary: Negative for difficulty urinating, frequency and vaginal pain.  Musculoskeletal: Positive for back pain. Negative for gait problem.  Skin: Negative for pallor and rash.  Neurological: Negative for dizziness, tremors, weakness, numbness and headaches.  Psychiatric/Behavioral: Negative for confusion and sleep disturbance.    Objective:  BP 124/72   Pulse 83   Temp 98 F (36.7 C) (Oral)   Resp 16   Ht 5\' 6"  (1.676 m)   Wt 223 lb 8 oz (101.4 kg)   SpO2 98%   BMI 36.07 kg/m   BP Readings from Last 3 Encounters:  08/11/16 124/72  07/22/16 (!) 102/57  05/22/16 133/76    Wt Readings from Last 3 Encounters:  08/11/16 223 lb 8 oz (101.4 kg)  05/22/16 220 lb (99.8 kg)  04/15/16 219 lb (99.3 kg)    Physical Exam  Constitutional: She appears well-developed. No distress.  HENT:  Head: Normocephalic.  Right Ear: External ear normal.  Left Ear: External ear normal.  Nose: Nose normal.  Mouth/Throat: Oropharynx is clear and moist.  Eyes: Conjunctivae are normal. Pupils are equal, round, and reactive to light. Right eye exhibits no discharge. Left eye exhibits no discharge.  Neck: Normal range of motion. Neck supple. No JVD present. No tracheal deviation present. No thyromegaly present.  Cardiovascular: Normal rate, regular rhythm and normal heart sounds.    Pulmonary/Chest: No stridor. No respiratory distress. She has no wheezes.  Abdominal: Soft. Bowel sounds are normal. She exhibits no distension and no mass. There is no tenderness. There is no rebound and no guarding.  Musculoskeletal: She exhibits no edema or tenderness.  Lymphadenopathy:    She has no cervical adenopathy.  Neurological: She displays normal reflexes. No cranial nerve deficit. She exhibits normal muscle tone. Coordination normal.  Skin: No rash noted. No erythema.  Psychiatric: She has a normal mood and affect. Her behavior is normal. Judgment and thought content normal.  Obese Port on the R   Ct Abdomen Pelvis Wo Contrast  Result Date: 07/23/2015 CLINICAL DATA:  History of lymphoma.  Left lower quadrant pain EXAM: CT ABDOMEN AND PELVIS WITHOUT CONTRAST TECHNIQUE: Multidetector CT imaging of the abdomen and pelvis was performed following the standard protocol without IV contrast. COMPARISON:  CT abdomen pelvis 03/07/2014 FINDINGS: Lower chest: Lung bases clear without infiltrate or effusion. Heart size normal. Hepatobiliary: The liver is normal in size and contour without focal lesion. Gallbladder and bile ducts normal. Pancreas: Negative Spleen: Negative Adrenals/Urinary Tract: Negative for renal mass or obstruction. No renal calculi. Urinary bladder normal. Stomach/Bowel: Mild to moderate hiatal hernia. No bowel obstruction. Prior bowel resection in the mid small bowel with surgical clips. This is similar to the prior study. Interval development of 2.5 cm cyst in the left abdomen at the level of the iliac crest. This is unchanged from the prior study and may represent  a duplication cyst Vascular/Lymphatic: Negative for atherosclerotic disease or aneurysm. No lymphadenopathy. Reproductive: Prior hysterectomy. Other: Ventral hernia containing omentum. This is unchanged from the prior study. No fluid or bowel in the hernia sac. Musculoskeletal: Moderate disc degeneration and spurring  at L5-S1. No acute bony abnormality. IMPRESSION: No acute abnormality. Negative for lymphoma recurrence. Ventral hernia containing fat is unchanged 2.5 cm cyst in the left upper pelvis associated with bowel. 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 Electronically Signed   By: Franchot Gallo M.D.   On: 07/23/2015 11:27    Assessment & Plan:   There are no diagnoses linked to this encounter. I have discontinued Ms. Finnegan meloxicam and NEOMYCIN-POLYMYXIN-HYDROCORTISONE. I am also having her maintain her ferrous sulfate, cholecalciferol, Aspirin-Salicylamide-Caffeine (BC HEADACHE PO), Cyanocobalamin (VITAMIN B 12 PO), losartan, amLODipine, levothyroxine, and lidocaine-prilocaine.  No orders of the defined types were placed in this encounter.    Follow-up: No Follow-up on file.  Walker Kehr, MD

## 2016-08-11 NOTE — Assessment & Plan Note (Signed)
F/u w/onc

## 2016-09-16 ENCOUNTER — Ambulatory Visit (HOSPITAL_BASED_OUTPATIENT_CLINIC_OR_DEPARTMENT_OTHER): Payer: Medicare Other

## 2016-09-16 DIAGNOSIS — Z8572 Personal history of non-Hodgkin lymphomas: Secondary | ICD-10-CM

## 2016-09-16 DIAGNOSIS — C8213 Follicular lymphoma grade II, intra-abdominal lymph nodes: Secondary | ICD-10-CM

## 2016-09-16 DIAGNOSIS — Z452 Encounter for adjustment and management of vascular access device: Secondary | ICD-10-CM | POA: Diagnosis not present

## 2016-09-16 MED ORDER — ALTEPLASE 2 MG IJ SOLR
2.0000 mg | Freq: Once | INTRAMUSCULAR | Status: DC | PRN
  Filled 2016-09-16: qty 2

## 2016-09-16 MED ORDER — HEPARIN SOD (PORK) LOCK FLUSH 100 UNIT/ML IV SOLN
500.0000 [IU] | Freq: Once | INTRAVENOUS | Status: DC
Start: 2016-09-16 — End: 2016-09-16
  Filled 2016-09-16: qty 5

## 2016-09-16 MED ORDER — SODIUM CHLORIDE 0.9 % IJ SOLN
10.0000 mL | INTRAMUSCULAR | Status: DC | PRN
  Administered 2016-09-16: 10 mL via INTRAVENOUS
  Filled 2016-09-16: qty 10

## 2016-10-14 ENCOUNTER — Other Ambulatory Visit: Payer: Self-pay | Admitting: Internal Medicine

## 2016-10-30 ENCOUNTER — Encounter: Payer: Self-pay | Admitting: Internal Medicine

## 2016-11-05 ENCOUNTER — Encounter: Payer: Self-pay | Admitting: Gynecology

## 2016-11-18 ENCOUNTER — Telehealth: Payer: Self-pay | Admitting: Oncology

## 2016-11-18 ENCOUNTER — Ambulatory Visit (HOSPITAL_BASED_OUTPATIENT_CLINIC_OR_DEPARTMENT_OTHER): Payer: Medicare Other | Admitting: Oncology

## 2016-11-18 ENCOUNTER — Other Ambulatory Visit (HOSPITAL_BASED_OUTPATIENT_CLINIC_OR_DEPARTMENT_OTHER): Payer: Medicare Other

## 2016-11-18 ENCOUNTER — Ambulatory Visit (HOSPITAL_BASED_OUTPATIENT_CLINIC_OR_DEPARTMENT_OTHER): Payer: Medicare Other

## 2016-11-18 VITALS — BP 133/64 | HR 62 | Temp 98.2°F | Resp 17 | Ht 66.0 in | Wt 220.5 lb

## 2016-11-18 DIAGNOSIS — C8213 Follicular lymphoma grade II, intra-abdominal lymph nodes: Secondary | ICD-10-CM

## 2016-11-18 DIAGNOSIS — Z8572 Personal history of non-Hodgkin lymphomas: Secondary | ICD-10-CM

## 2016-11-18 DIAGNOSIS — C8233 Follicular lymphoma grade IIIa, intra-abdominal lymph nodes: Secondary | ICD-10-CM

## 2016-11-18 DIAGNOSIS — Z452 Encounter for adjustment and management of vascular access device: Secondary | ICD-10-CM

## 2016-11-18 LAB — CBC WITH DIFFERENTIAL/PLATELET
BASO%: 0.2 % (ref 0.0–2.0)
BASOS ABS: 0 10*3/uL (ref 0.0–0.1)
EOS ABS: 0.2 10*3/uL (ref 0.0–0.5)
EOS%: 3.7 % (ref 0.0–7.0)
HEMATOCRIT: 37.1 % (ref 34.8–46.6)
HEMOGLOBIN: 12.2 g/dL (ref 11.6–15.9)
LYMPH#: 2.1 10*3/uL (ref 0.9–3.3)
LYMPH%: 43.3 % (ref 14.0–49.7)
MCH: 27.7 pg (ref 25.1–34.0)
MCHC: 32.9 g/dL (ref 31.5–36.0)
MCV: 84.3 fL (ref 79.5–101.0)
MONO#: 0.4 10*3/uL (ref 0.1–0.9)
MONO%: 8.6 % (ref 0.0–14.0)
NEUT#: 2.2 10*3/uL (ref 1.5–6.5)
NEUT%: 44.2 % (ref 38.4–76.8)
PLATELETS: 207 10*3/uL (ref 145–400)
RBC: 4.4 10*6/uL (ref 3.70–5.45)
RDW: 14.3 % (ref 11.2–14.5)
WBC: 4.9 10*3/uL (ref 3.9–10.3)

## 2016-11-18 LAB — COMPREHENSIVE METABOLIC PANEL
ALBUMIN: 3.6 g/dL (ref 3.5–5.0)
ALK PHOS: 94 U/L (ref 40–150)
ALT: 15 U/L (ref 0–55)
ANION GAP: 10 meq/L (ref 3–11)
AST: 17 U/L (ref 5–34)
BUN: 20.9 mg/dL (ref 7.0–26.0)
CALCIUM: 9.7 mg/dL (ref 8.4–10.4)
CHLORIDE: 111 meq/L — AB (ref 98–109)
CO2: 23 mEq/L (ref 22–29)
CREATININE: 1.7 mg/dL — AB (ref 0.6–1.1)
EGFR: 36 mL/min/{1.73_m2} — ABNORMAL LOW (ref >90)
Glucose: 106 mg/dl (ref 70–140)
POTASSIUM: 4.3 meq/L (ref 3.5–5.1)
Sodium: 143 mEq/L (ref 136–145)
Total Bilirubin: 0.62 mg/dL (ref 0.20–1.20)
Total Protein: 6.5 g/dL (ref 6.4–8.3)

## 2016-11-18 MED ORDER — SODIUM CHLORIDE 0.9 % IJ SOLN
10.0000 mL | INTRAMUSCULAR | Status: DC | PRN
  Administered 2016-11-18: 10 mL via INTRAVENOUS
  Filled 2016-11-18: qty 10

## 2016-11-18 MED ORDER — HEPARIN SOD (PORK) LOCK FLUSH 100 UNIT/ML IV SOLN
500.0000 [IU] | Freq: Once | INTRAVENOUS | Status: AC | PRN
  Administered 2016-11-18: 500 [IU] via INTRAVENOUS
  Filled 2016-11-18: qty 5

## 2016-11-18 NOTE — Telephone Encounter (Signed)
Gave patient AVS and calender per 5/29 los.

## 2016-11-18 NOTE — Progress Notes (Signed)
Hematology and Oncology Follow Up Visit  Christine Morales [id number] 04/06/1946 71 y.o. 11/18/2016 10:17 AM  Principle Diagnosis: 71 year old female with follicular lymphoma, non-Hodgkin type, stage IIA, presented with a large mesenteric mass in July 2011.  Prior Therapy: She is S/P 5 cycles of bendamustine and rituximab, finished in December 2011. She had a complete response at that time. 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  Current therapy: Observation and surveillance.   Interim History:  Christine Morales presents today for a followup visit. Since the last visit, she reports no complaints. She remains active and attends to activities of daily living including going to the gym. She does not report any lymphadenopathy or petechiae. She has not reported any constitutional symptoms of fevers, chills or weight loss. She does not report any complication related to her Port-A-Cath. She did notice some discomfort in her flank although it is unclear etiology. She denied any lumps or masses or lesions. She denied any urinary symptoms.  She did not report any nausea, did not report any vomiting. She did not report any fevers, did not report any chills. She has not reported any headaches or blurry vision or double vision. Has not reported any lymphadenopathy or petechiae. Has not reported any chest pain or shortness of breath. Has not reported any cough or hemoptysis. Is not reporting any change in her bowel habits. Her rest of review of systems was unremarkable.  Medications: I have reviewed the patient's current medications. No change by my review. Current Outpatient Prescriptions  Medication Sig Dispense Refill  . amLODipine (NORVASC) 5 MG tablet take 1 tablet by mouth once daily 90 tablet 1  . Aspirin-Salicylamide-Caffeine (BC HEADACHE PO) Take 1 packet by mouth daily as needed  (headache).    . Cholecalciferol (VITAMIN D3) 1000 UNITS tablet Take 1,000 Units by mouth daily.      . Cyanocobalamin (VITAMIN B 12 PO) Take 1,000 mcg by mouth daily.    . ferrous sulfate (FERRO-BOB) 325 (65 FE) MG tablet Take 325 mg by mouth daily with breakfast.      . levothyroxine (SYNTHROID, LEVOTHROID) 100 MCG tablet take 1 tablet by mouth once daily   30 tablet 11  . lidocaine-prilocaine (EMLA) cream Apply 1 application topically as needed. Apply to port-a-cath site 1 to 2 hours prior to appt. 30 g 1  . losartan (COZAAR) 100 MG tablet take 1 tablet by mouth once daily 90 tablet 1  . meloxicam (MOBIC) 15 MG tablet Take 1 tablet (15 mg total) by mouth daily. 30 tablet 3  . NEOMYCIN-POLYMYXIN-HYDROCORTISONE (CORTISPORIN) 1 % SOLN otic solution Apply 1-2 drops to toe BID after soaking 10 mL 1   No current facility-administered medications for this visit.    Facility-Administered Medications Ordered in Other Visits  Medication Dose Route Frequency Provider Last Rate Last Dose  . heparin lock flush 100 unit/mL  500 Units Intravenous Once Wyatt Portela, MD      . sodium chloride 0.9 % injection 10 mL  10 mL Intravenous PRN Wyatt Portela, MD        Allergies: No Known Allergies  Physical Exam: Blood pressure 133/64, pulse 62, temperature 98.2 F (36.8 C), temperature source Oral, resp. rate 17, height 5\' 6"  (1.676 m), weight 220 lb 8 oz (100 kg), SpO2 100 %. ECOG: 1 General appearance: Alert, awake woman without distress. Head: Normocephalic, without  obvious abnormality no oral thrush or ulcers. Neck: no adenopathy, no thyroid masses. Lymph nodes: Cervical, supraclavicular, and axillary nodes normal. Heart:regular rate and rhythm, S1, S2 normal, no murmur, click, rub or gallop Lung:chest clear, no wheezing, rales, normal symmetric air entry Abdomin: soft, non-tender, without masses or organomegaly no rebound or guarding. I cannot palpate any adenopathy or masses on her right  flank. EXT:no erythema, induration, or nodules   Lab Results: Lab Results  Component Value Date   WBC 4.9 11/18/2016   HGB 12.2 11/18/2016   HCT 37.1 11/18/2016   MCV 84.3 11/18/2016   PLT 207 11/18/2016      Impression and Plan:   71 year old female with the following issues:  1. Follicular lymphoma, stage IIA disease, presented with a large mesenteric mass. She is status post systemic chemotherapy completed in December 2011 and had been in remission since. Physical examination and laboratory data do not suggest any recurrent disease. The breast continue with active surveillance at this time and repeat imaging studies as needed. 2. Port-A-Cath management.  We will flush her Port-A-Cath every 8 weeks. Risks and benefits of keeping her Port-A-Cath were reviewed today and she elects to keep it for the time being. 3.     Flank pain: Appears to be intermittent and vague in nature. Could not palpate any adenopathy or masses. We will consider imaging studies of this pain persists or increases. 4.     Renal insufficiency: Likely related to hypertension. Her creatinine appears to be stable and close to baseline 5.     Follow up: in 6 months for a clinical visit.      [facility], MD 5/29/201810:17 AM

## 2017-01-19 ENCOUNTER — Ambulatory Visit (HOSPITAL_BASED_OUTPATIENT_CLINIC_OR_DEPARTMENT_OTHER): Payer: Medicare Other

## 2017-01-19 DIAGNOSIS — Z8572 Personal history of non-Hodgkin lymphomas: Secondary | ICD-10-CM | POA: Diagnosis not present

## 2017-01-19 DIAGNOSIS — C8213 Follicular lymphoma grade II, intra-abdominal lymph nodes: Secondary | ICD-10-CM

## 2017-01-19 DIAGNOSIS — Z452 Encounter for adjustment and management of vascular access device: Secondary | ICD-10-CM

## 2017-01-19 MED ORDER — HEPARIN SOD (PORK) LOCK FLUSH 100 UNIT/ML IV SOLN
500.0000 [IU] | Freq: Once | INTRAVENOUS | Status: AC | PRN
  Administered 2017-01-19: 500 [IU] via INTRAVENOUS
  Filled 2017-01-19: qty 5

## 2017-01-19 MED ORDER — SODIUM CHLORIDE 0.9 % IJ SOLN
10.0000 mL | INTRAMUSCULAR | Status: DC | PRN
  Administered 2017-01-19: 10 mL via INTRAVENOUS
  Filled 2017-01-19: qty 10

## 2017-01-19 NOTE — Patient Instructions (Signed)

## 2017-03-16 ENCOUNTER — Encounter: Payer: Self-pay | Admitting: Women's Health

## 2017-03-16 ENCOUNTER — Ambulatory Visit (INDEPENDENT_AMBULATORY_CARE_PROVIDER_SITE_OTHER): Payer: Medicare Other | Admitting: Women's Health

## 2017-03-16 VITALS — BP 122/80 | Ht 66.0 in | Wt 216.0 lb

## 2017-03-16 DIAGNOSIS — Z01419 Encounter for gynecological examination (general) (routine) without abnormal findings: Secondary | ICD-10-CM

## 2017-03-16 NOTE — Progress Notes (Signed)
Christine Morales Aug 28, 1945 [id number]    History:    Presents for annual exam. TAH in 2008 fibroids, Non Hodgkin's Lymphoma Stage IIA; dx in 2011- completed chemo in 2011, followed by Dr. Rodena Piety . Hx of normal paps and mammograms; . Not sexually active/ widow 2017.  2016 DEXA normal at PCP. Last colonoscopy was 2008; plans to schedule repeat in Jan 2019. Takes daily miltivitamin with Vit D.   Past medical history, past surgical history, family history and social history were all reviewed and documented in the EPIC chart. Reports having minimal appetite lately- possibly related to not having anyone to cook for/eating alone since husband passed. Drinks 1 soda per day, but prefers water most of the time. Goes to the [facility] 3x a week for exercise activities. Denies any problems with sleep. Active with grandchildren..    ROS:  A ROS was performed and pertinent positives and negatives are included.  Exam:  Vitals:   03/16/17 1006  BP: 122/80  Weight: 216 lb (98 kg)  Height: 5\' 6"  (1.676 m)   Body mass index is 34.86 kg/m.   General appearance:  Normal Thyroid:  Symmetrical, normal in size, without palpable masses or nodularity. Respiratory  Auscultation:  Clear without wheezing or rhonchi Cardiovascular  Auscultation:  Regular rate, without rubs, murmurs or gallops  Edema/varicosities:  Not grossly evident Abdominal  Soft,nontender, without masses, guarding or rebound.  Liver/spleen:  No organomegaly noted  Hernia:  None appreciated  Skin  Inspection:  Recently noticed a small bump in R flank area; somewhat tender. Appears to  be a small lipoma. Skin otherwise unremarkable.    Breasts: Examined lying and sitting. Bilateral breast reduction; port a cath R chest wall    Right: Without masses, retractions, discharge or axillary adenopathy.  Mammoplasty scar noted.     Left:  Without masses, retractions, discharge or axillary adenopathy.  Mammoplasty scar noted. Gentitourinary    Inguinal/mons:  Normal without inguinal adenopathy  External genitalia:  Normal  BUS/Urethra/Skene's glands:  Normal  Vagina:  Normal, some dryness noted; as expected age related atrophy  Cervix:  Absent   Uterus:  Absent  Adnexa/parametria:     Rt: Without masses or tenderness.   Lt: Without masses or tenderness.  Anus and perineum: n/a  Digital rectal exam: n/a  Assessment/Plan:  71 y.o. WBF G2P2  for annual exam.  Appears well, but melancholy. No concerns noted today other than small lump on R lower back- appears to be a small lypoma on exam.  TAH 2008 with ovarian conservation secondary to leimyomatous uteri Non-Hodkin Lymphoma Stage IIA (2011) in remission- followed by Dr. Rodena Piety Hypertension, hypothyroid, CRF- followed by PCP for meds and labs  Plan: Patient encouraged to continue SBEs, annual mammograms, exercise, multivitamin with vit D. Endorses that she would like to eliminate soda from her diet all together- trying to drink more water through out the day. Encouraged to take some time for herself in an activity she enjoys. Recommended annual flu shot and Shingrix series- plans to have PCP prescribe. Due for routine colonoscopy- plans to schedule for Jan 2019.       Benson, 10:37 AM 03/16/2017

## 2017-03-16 NOTE — Patient Instructions (Signed)
Health Maintenance for Postmenopausal Women Menopause is a normal process in which your reproductive ability comes to an end. This process happens gradually over a span of months to years, usually between the ages of 22 and 9. Menopause is complete when you have missed 12 consecutive menstrual periods. It is important to talk with your health care provider about some of the most common conditions that affect postmenopausal women, such as heart disease, cancer, and bone loss (osteoporosis). Adopting a healthy lifestyle and getting preventive care can help to promote your health and wellness. Those actions can also lower your chances of developing some of these common conditions. What should I know about menopause? During menopause, you may experience a number of symptoms, such as:  Moderate-to-severe hot flashes.  Night sweats.  Decrease in sex drive.  Mood swings.  Headaches.  Tiredness.  Irritability.  Memory problems.  Insomnia.  Choosing to treat or not to treat menopausal changes is an individual decision that you make with your health care provider. What should I know about hormone replacement therapy and supplements? Hormone therapy products are effective for treating symptoms that are associated with menopause, such as hot flashes and night sweats. Hormone replacement carries certain risks, especially as you become older. If you are thinking about using estrogen or estrogen with progestin treatments, discuss the benefits and risks with your health care provider. What should I know about heart disease and stroke? Heart disease, heart attack, and stroke become more likely as you age. This may be due, in part, to the hormonal changes that your body experiences during menopause. These can affect how your body processes dietary fats, triglycerides, and cholesterol. Heart attack and stroke are both medical emergencies. There are many things that you can do to help prevent heart disease  and stroke:  Have your blood pressure checked at least every 1-2 years. High blood pressure causes heart disease and increases the risk of stroke.  If you are 53-22 years old, ask your health care provider if you should take aspirin to prevent a heart attack or a stroke.  Do not use any tobacco products, including cigarettes, chewing tobacco, or electronic cigarettes. If you need help quitting, ask your health care provider.  It is important to eat a healthy diet and maintain a healthy weight. ? Be sure to include plenty of vegetables, fruits, low-fat dairy products, and lean protein. ? Avoid eating foods that are high in solid fats, added sugars, or salt (sodium).  Get regular exercise. This is one of the most important things that you can do for your health. ? Try to exercise for at least 150 minutes each week. The type of exercise that you do should increase your heart rate and make you sweat. This is known as moderate-intensity exercise. ? Try to do strengthening exercises at least twice each week. Do these in addition to the moderate-intensity exercise.  Know your numbers.Ask your health care provider to check your cholesterol and your blood glucose. Continue to have your blood tested as directed by your health care provider.  What should I know about cancer screening? There are several types of cancer. Take the following steps to reduce your risk and to catch any cancer development as early as possible. Breast Cancer  Practice breast self-awareness. ? This means understanding how your breasts normally appear and feel. ? It also means doing regular breast self-exams. Let your health care provider know about any changes, no matter how small.  If you are 40  or older, have a clinician do a breast exam (clinical breast exam or CBE) every year. Depending on your age, family history, and medical history, it may be recommended that you also have a yearly breast X-ray (mammogram).  If you  have a family history of breast cancer, talk with your health care provider about genetic screening.  If you are at high risk for breast cancer, talk with your health care provider about having an MRI and a mammogram every year.  Breast cancer (BRCA) gene test is recommended for women who have family members with BRCA-related cancers. Results of the assessment will determine the need for genetic counseling and BRCA1 and for BRCA2 testing. BRCA-related cancers include these types: ? Breast. This occurs in males or females. ? Ovarian. ? Tubal. This may also be called fallopian tube cancer. ? Cancer of the abdominal or pelvic lining (peritoneal cancer). ? Prostate. ? Pancreatic.  Cervical, Uterine, and Ovarian Cancer Your health care provider may recommend that you be screened regularly for cancer of the pelvic organs. These include your ovaries, uterus, and vagina. This screening involves a pelvic exam, which includes checking for microscopic changes to the surface of your cervix (Pap test).  For women ages 21-65, health care providers may recommend a pelvic exam and a Pap test every three years. For women ages 79-65, they may recommend the Pap test and pelvic exam, combined with testing for human papilloma virus (HPV), every five years. Some types of HPV increase your risk of cervical cancer. Testing for HPV may also be done on women of any age who have unclear Pap test results.  Other health care providers may not recommend any screening for nonpregnant women who are considered low risk for pelvic cancer and have no symptoms. Ask your health care provider if a screening pelvic exam is right for you.  If you have had past treatment for cervical cancer or a condition that could lead to cancer, you need Pap tests and screening for cancer for at least 20 years after your treatment. If Pap tests have been discontinued for you, your risk factors (such as having a new sexual partner) need to be  reassessed to determine if you should start having screenings again. Some women have medical problems that increase the chance of getting cervical cancer. In these cases, your health care provider may recommend that you have screening and Pap tests more often.  If you have a family history of uterine cancer or ovarian cancer, talk with your health care provider about genetic screening.  If you have vaginal bleeding after reaching menopause, tell your health care provider.  There are currently no reliable tests available to screen for ovarian cancer.  Lung Cancer Lung cancer screening is recommended for adults 69-62 years old who are at high risk for lung cancer because of a history of smoking. A yearly low-dose CT scan of the lungs is recommended if you:  Currently smoke.  Have a history of at least 30 pack-years of smoking and you currently smoke or have quit within the past 15 years. A pack-year is smoking an average of one pack of cigarettes per day for one year.  Yearly screening should:  Continue until it has been 15 years since you quit.  Stop if you develop a health problem that would prevent you from having lung cancer treatment.  Colorectal Cancer  This type of cancer can be detected and can often be prevented.  Routine colorectal cancer screening usually begins at  age 42 and continues through age 45.  If you have risk factors for colon cancer, your health care provider may recommend that you be screened at an earlier age.  If you have a family history of colorectal cancer, talk with your health care provider about genetic screening.  Your health care provider may also recommend using home test kits to check for hidden blood in your stool.  A small camera at the end of a tube can be used to examine your colon directly (sigmoidoscopy or colonoscopy). This is done to check for the earliest forms of colorectal cancer.  Direct examination of the colon should be repeated every  5-10 years until age 71. However, if early forms of precancerous polyps or small growths are found or if you have a family history or genetic risk for colorectal cancer, you may need to be screened more often.  Skin Cancer  Check your skin from head to toe regularly.  Monitor any moles. Be sure to tell your health care provider: ? About any new moles or changes in moles, especially if there is a change in a mole's shape or color. ? If you have a mole that is larger than the size of a pencil eraser.  If any of your family members has a history of skin cancer, especially at a Kinley Dozier age, talk with your health care provider about genetic screening.  Always use sunscreen. Apply sunscreen liberally and repeatedly throughout the day.  Whenever you are outside, protect yourself by wearing long sleeves, pants, a wide-brimmed hat, and sunglasses.  What should I know about osteoporosis? Osteoporosis is a condition in which bone destruction happens more quickly than new bone creation. After menopause, you may be at an increased risk for osteoporosis. To help prevent osteoporosis or the bone fractures that can happen because of osteoporosis, the following is recommended:  If you are 46-71 years old, get at least 1,000 mg of calcium and at least 600 mg of vitamin D per day.  If you are older than age 55 but younger than age 65, get at least 1,200 mg of calcium and at least 600 mg of vitamin D per day.  If you are older than age 54, get at least 1,200 mg of calcium and at least 800 mg of vitamin D per day.  Smoking and excessive alcohol intake increase the risk of osteoporosis. Eat foods that are rich in calcium and vitamin D, and do weight-bearing exercises several times each week as directed by your health care provider. What should I know about how menopause affects my mental health? Depression may occur at any age, but it is more common as you become older. Common symptoms of depression  include:  Low or sad mood.  Changes in sleep patterns.  Changes in appetite or eating patterns.  Feeling an overall lack of motivation or enjoyment of activities that you previously enjoyed.  Frequent crying spells.  Talk with your health care provider if you think that you are experiencing depression. What should I know about immunizations? It is important that you get and maintain your immunizations. These include:  Tetanus, diphtheria, and pertussis (Tdap) booster vaccine.  Influenza every year before the flu season begins.  Pneumonia vaccine.  Shingles vaccine.  Your health care provider may also recommend other immunizations. This information is not intended to replace advice given to you by your health care provider. Make sure you discuss any questions you have with your health care provider. Document Released: 08/01/2005  Document Revised: 12/28/2015 Document Reviewed: 03/13/2015 Elsevier Interactive Patient Education  2018 Elsevier Inc.  

## 2017-03-16 NOTE — Addendum Note (Signed)
Addended by: Huel Cote on: 03/16/2017 12:18 PM   Modules accepted: Level of Service

## 2017-03-20 ENCOUNTER — Ambulatory Visit (HOSPITAL_BASED_OUTPATIENT_CLINIC_OR_DEPARTMENT_OTHER): Payer: Medicare Other

## 2017-03-20 DIAGNOSIS — Z8572 Personal history of non-Hodgkin lymphomas: Secondary | ICD-10-CM | POA: Diagnosis not present

## 2017-03-20 DIAGNOSIS — Z452 Encounter for adjustment and management of vascular access device: Secondary | ICD-10-CM

## 2017-03-20 DIAGNOSIS — C8213 Follicular lymphoma grade II, intra-abdominal lymph nodes: Secondary | ICD-10-CM

## 2017-03-20 MED ORDER — HEPARIN SOD (PORK) LOCK FLUSH 100 UNIT/ML IV SOLN
500.0000 [IU] | Freq: Once | INTRAVENOUS | Status: AC | PRN
  Administered 2017-03-20: 500 [IU] via INTRAVENOUS
  Filled 2017-03-20: qty 5

## 2017-03-20 MED ORDER — SODIUM CHLORIDE 0.9 % IJ SOLN
10.0000 mL | INTRAMUSCULAR | Status: DC | PRN
  Administered 2017-03-20: 10 mL via INTRAVENOUS
  Filled 2017-03-20: qty 10

## 2017-03-26 LAB — HM MAMMOGRAPHY

## 2017-04-01 ENCOUNTER — Encounter: Payer: Self-pay | Admitting: Internal Medicine

## 2017-05-10 ENCOUNTER — Other Ambulatory Visit: Payer: Self-pay | Admitting: Internal Medicine

## 2017-05-13 ENCOUNTER — Telehealth: Payer: Self-pay | Admitting: Internal Medicine

## 2017-05-13 ENCOUNTER — Other Ambulatory Visit: Payer: Self-pay | Admitting: Internal Medicine

## 2017-05-13 MED ORDER — AMLODIPINE BESYLATE 5 MG PO TABS: 5.0000 mg | ORAL_TABLET | Freq: Every day | ORAL | 0 refills | Status: DC

## 2017-05-13 NOTE — Telephone Encounter (Signed)
30 day RX sent, pt needs OV before additional refills

## 2017-05-13 NOTE — Telephone Encounter (Signed)
Patient is requesting refill on amlodipine to be sent to [facility] on [address].  Patient has been out for three days.

## 2017-05-20 ENCOUNTER — Ambulatory Visit (HOSPITAL_BASED_OUTPATIENT_CLINIC_OR_DEPARTMENT_OTHER): Payer: Medicare Other

## 2017-05-20 ENCOUNTER — Telehealth: Payer: Self-pay

## 2017-05-20 ENCOUNTER — Other Ambulatory Visit (HOSPITAL_BASED_OUTPATIENT_CLINIC_OR_DEPARTMENT_OTHER): Payer: Medicare Other

## 2017-05-20 ENCOUNTER — Ambulatory Visit (HOSPITAL_BASED_OUTPATIENT_CLINIC_OR_DEPARTMENT_OTHER): Payer: Medicare Other | Admitting: Oncology

## 2017-05-20 VITALS — BP 124/74 | HR 69 | Temp 97.8°F | Resp 18 | Ht 66.0 in | Wt 216.4 lb

## 2017-05-20 DIAGNOSIS — C8233 Follicular lymphoma grade IIIa, intra-abdominal lymph nodes: Secondary | ICD-10-CM

## 2017-05-20 DIAGNOSIS — Z8572 Personal history of non-Hodgkin lymphomas: Secondary | ICD-10-CM

## 2017-05-20 DIAGNOSIS — Z452 Encounter for adjustment and management of vascular access device: Secondary | ICD-10-CM

## 2017-05-20 DIAGNOSIS — N289 Disorder of kidney and ureter, unspecified: Secondary | ICD-10-CM

## 2017-05-20 DIAGNOSIS — I10 Essential (primary) hypertension: Secondary | ICD-10-CM

## 2017-05-20 DIAGNOSIS — C8213 Follicular lymphoma grade II, intra-abdominal lymph nodes: Secondary | ICD-10-CM

## 2017-05-20 LAB — COMPREHENSIVE METABOLIC PANEL
ALT: 16 U/L (ref 0–55)
AST: 19 U/L (ref 5–34)
Albumin: 3.7 g/dL (ref 3.5–5.0)
Alkaline Phosphatase: 87 U/L (ref 40–150)
Anion Gap: 9 mEq/L (ref 3–11)
BILIRUBIN TOTAL: 0.65 mg/dL (ref 0.20–1.20)
BUN: 21.7 mg/dL (ref 7.0–26.0)
CO2: 23 meq/L (ref 22–29)
Calcium: 9.7 mg/dL (ref 8.4–10.4)
Chloride: 109 mEq/L (ref 98–109)
Creatinine: 1.7 mg/dL — ABNORMAL HIGH (ref 0.6–1.1)
EGFR: 35 mL/min/{1.73_m2} — AB (ref >60)
GLUCOSE: 102 mg/dL (ref 70–140)
Potassium: 4.3 mEq/L (ref 3.5–5.1)
SODIUM: 141 meq/L (ref 136–145)
Total Protein: 6.7 g/dL (ref 6.4–8.3)

## 2017-05-20 LAB — CBC WITH DIFFERENTIAL/PLATELET
BASO%: 0.5 % (ref 0.0–2.0)
BASOS ABS: 0 10*3/uL (ref 0.0–0.1)
EOS%: 1.9 % (ref 0.0–7.0)
Eosinophils Absolute: 0.1 10*3/uL (ref 0.0–0.5)
HCT: 36.2 % (ref 34.8–46.6)
HGB: 12 g/dL (ref 11.6–15.9)
LYMPH#: 2.1 10*3/uL (ref 0.9–3.3)
LYMPH%: 45 % (ref 14.0–49.7)
MCH: 28 pg (ref 25.1–34.0)
MCHC: 33.2 g/dL (ref 31.5–36.0)
MCV: 84.3 fL (ref 79.5–101.0)
MONO#: 0.4 10*3/uL (ref 0.1–0.9)
MONO%: 7.9 % (ref 0.0–14.0)
NEUT#: 2.1 10*3/uL (ref 1.5–6.5)
NEUT%: 44.7 % (ref 38.4–76.8)
PLATELETS: 239 10*3/uL (ref 145–400)
RBC: 4.3 10*6/uL (ref 3.70–5.45)
RDW: 14.4 % (ref 11.2–14.5)
WBC: 4.7 10*3/uL (ref 3.9–10.3)

## 2017-05-20 MED ORDER — HEPARIN SOD (PORK) LOCK FLUSH 100 UNIT/ML IV SOLN
500.0000 [IU] | Freq: Once | INTRAVENOUS | Status: AC | PRN
Start: 2017-05-20 — End: 2017-05-20
  Administered 2017-05-20: 500 [IU] via INTRAVENOUS
  Filled 2017-05-20: qty 5

## 2017-05-20 MED ORDER — SODIUM CHLORIDE 0.9 % IJ SOLN
10.0000 mL | INTRAMUSCULAR | Status: AC | PRN
  Administered 2017-05-20: 10 mL via INTRAVENOUS
  Filled 2017-05-20: qty 10

## 2017-05-20 NOTE — Progress Notes (Signed)
Hematology and Oncology Follow Up Visit  Christine Morales [id number] August 01, 1945 71 y.o. 05/20/2017 10:44 AM  Principle Diagnosis: 71 year old female with follicular lymphoma, non-Hodgkin type, stage IIA, presented with a large mesenteric mass in July 2011.  Prior Therapy: She is S/P 5 cycles of bendamustine and rituximab, finished in December 2011. She had a complete response at that time. 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  Current therapy: Observation and surveillance.   Interim History:  Christine Morales presents today for a followup visit. Since the last visit, she did do well.  She denies any abdominal pain or early satiety.  She denies any lymphadenopathy or constitutional symptoms.. She remains active and attends to activities of daily living. She does not report any complication related to her Port-A-Cath. She denied any lumps or masses or lesions. She denied any urinary symptoms.  Her quality of life is unchanged.  She did not report any nausea, did not report any vomiting. She did not report any fevers, did not report any chills. She has not reported any headaches or blurry vision or double vision. Has not reported any lymphadenopathy or petechiae. Has not reported any chest pain or shortness of breath. Has not reported any cough or hemoptysis. Is not reporting any change in her bowel habits. Her rest of review of systems was unremarkable.  Medications: I have reviewed the patient's current medications. No change by my review. Current Outpatient Prescriptions  Medication Sig Dispense Refill  . amLODipine (NORVASC) 5 MG tablet take 1 tablet by mouth once daily 90 tablet 1  . Aspirin-Salicylamide-Caffeine (BC HEADACHE PO) Take 1 packet by mouth daily as needed (headache).    . Cholecalciferol (VITAMIN D3) 1000 UNITS tablet Take 1,000 Units by mouth daily.      .  Cyanocobalamin (VITAMIN B 12 PO) Take 1,000 mcg by mouth daily.    . ferrous sulfate (FERRO-BOB) 325 (65 FE) MG tablet Take 325 mg by mouth daily with breakfast.      . levothyroxine (SYNTHROID, LEVOTHROID) 100 MCG tablet take 1 tablet by mouth once daily   30 tablet 11  . lidocaine-prilocaine (EMLA) cream Apply 1 application topically as needed. Apply to port-a-cath site 1 to 2 hours prior to appt. 30 g 1  . losartan (COZAAR) 100 MG tablet take 1 tablet by mouth once daily 90 tablet 1  . meloxicam (MOBIC) 15 MG tablet Take 1 tablet (15 mg total) by mouth daily. 30 tablet 3  . NEOMYCIN-POLYMYXIN-HYDROCORTISONE (CORTISPORIN) 1 % SOLN otic solution Apply 1-2 drops to toe BID after soaking 10 mL 1   No current facility-administered medications for this visit.    Facility-Administered Medications Ordered in Other Visits  Medication Dose Route Frequency Provider Last Rate Last Dose  . heparin lock flush 100 unit/mL  500 Units Intravenous Once Wyatt Portela, MD      . sodium chloride 0.9 % injection 10 mL  10 mL Intravenous PRN Wyatt Portela, MD        Allergies: No Known Allergies  Physical Exam: Blood pressure 124/74, pulse 69, temperature 97.8 F (36.6 C), temperature source Oral, resp. rate 18, height 5\' 6"  (1.676 m), weight 216 lb 6.4 oz (98.2 kg), SpO2 99 %. ECOG: 1 General appearance: Well-appearing woman without distress. Head: Normocephalic, without obvious abnormality no oral ulcers or thrush. Neck: no adenopathy, no thyroid masses. Lymph nodes: Cervical,  supraclavicular, and axillary nodes normal. Heart:regular rate and rhythm, S1, S2 normal, no murmur, click, rub or gallop Lung:chest clear, no wheezing, rales, normal symmetric air entry Abdomin: soft, non-tender, without masses or organomegaly no shifting dullness or ascites. I cannot palpate any adenopathy or masses on her right flank. EXT:no erythema, induration, or nodules   Lab Results: Lab Results  Component Value Date    WBC 4.7 05/20/2017   HGB 12.0 05/20/2017   HCT 36.2 05/20/2017   MCV 84.3 05/20/2017   PLT 239 05/20/2017      Impression and Plan:   71 year old female with the following issues:  1. Follicular lymphoma, stage IIA disease, presented with a large mesenteric mass. She is status post systemic chemotherapy completed in December 2011 and had been in remission since. Physical examination and laboratory data today are all within normal range.  I see no evidence to suggest recurrent disease.  The plan is to continue with active surveillance and repeat imaging studies she develops symptoms. 2. Port-A-Cath management.  We will flush her Port-A-Cath every 8 weeks.  She prefers to keep the Port-A-Cath and for the time being. 4.     Renal insufficiency: Likely related to hypertension.  Kidney function remains at baseline. 5.     Follow up: in 6 months for a clinical visit.      Zola Button, MD 11/28/201810:44 AM

## 2017-05-20 NOTE — Telephone Encounter (Signed)
Printed patient avs and calender ffor upcoming appointment. Per 11/28 los

## 2017-05-20 NOTE — Patient Instructions (Signed)

## 2017-05-30 ENCOUNTER — Other Ambulatory Visit: Payer: Self-pay | Admitting: Nurse Practitioner

## 2017-06-09 ENCOUNTER — Other Ambulatory Visit: Payer: Self-pay | Admitting: Internal Medicine

## 2017-06-17 ENCOUNTER — Other Ambulatory Visit: Payer: Self-pay | Admitting: Internal Medicine

## 2017-06-18 ENCOUNTER — Telehealth: Payer: Self-pay | Admitting: Internal Medicine

## 2017-06-18 ENCOUNTER — Other Ambulatory Visit: Payer: Self-pay | Admitting: Internal Medicine

## 2017-06-18 MED ORDER — LEVOTHYROXINE SODIUM 100 MCG PO TABS: ORAL_TABLET | ORAL | 0 refills | Status: DC

## 2017-06-18 MED ORDER — AMLODIPINE BESYLATE 5 MG PO TABS: 5.0000 mg | ORAL_TABLET | Freq: Every day | ORAL | 0 refills | Status: DC

## 2017-06-18 NOTE — Telephone Encounter (Signed)
Refills has been denied twice by provider. Pt need OV for medication refill. Last saw MD 07/2016. Pls call to make appt w/MD for medication renewal.../lmb

## 2017-06-18 NOTE — Telephone Encounter (Signed)
Copied from [place] [id number]. Topic: Quick Communication - Rx Refill/Question >> Jun 18, 2017 12:07 PM Malena Catholic I, NT wrote: Has the patient contacted their pharmacy yes   (Agent: If no, request that the patient contact the pharmacy for the refill Synthroid 100 MG and Norvasc 5 MG     Preferred Pharmacy (with phone number or street name [facility] [place] [phone]   Agent: Please be advised that RX refills may take up to 3 business days. We ask that you follow-up with your pharmacy.

## 2017-06-18 NOTE — Telephone Encounter (Signed)
Pt needs OV to get RX filled, please schedule appt

## 2017-06-18 NOTE — Telephone Encounter (Signed)
Spoke with patients daughter and informed her mother need to call back and set up an appointment with Dr.Plotnikov. She stated she will give her the information.

## 2017-06-18 NOTE — Telephone Encounter (Signed)
Per office policy sent 30 day to local pharmacy until appt.../lmb  

## 2017-06-18 NOTE — Telephone Encounter (Signed)
Pt scheduled for 1/2 with Plot. Please advise on refills

## 2017-06-24 ENCOUNTER — Other Ambulatory Visit (INDEPENDENT_AMBULATORY_CARE_PROVIDER_SITE_OTHER): Payer: Medicare Other

## 2017-06-24 ENCOUNTER — Encounter: Payer: Self-pay | Admitting: Internal Medicine

## 2017-06-24 ENCOUNTER — Ambulatory Visit: Payer: Medicare Other | Admitting: Internal Medicine

## 2017-06-24 DIAGNOSIS — I10 Essential (primary) hypertension: Secondary | ICD-10-CM | POA: Diagnosis not present

## 2017-06-24 DIAGNOSIS — C8233 Follicular lymphoma grade IIIa, intra-abdominal lymph nodes: Secondary | ICD-10-CM

## 2017-06-24 DIAGNOSIS — N189 Chronic kidney disease, unspecified: Secondary | ICD-10-CM | POA: Diagnosis not present

## 2017-06-24 DIAGNOSIS — E538 Deficiency of other specified B group vitamins: Secondary | ICD-10-CM

## 2017-06-24 DIAGNOSIS — D509 Iron deficiency anemia, unspecified: Secondary | ICD-10-CM | POA: Diagnosis not present

## 2017-06-24 DIAGNOSIS — R8290 Unspecified abnormal findings in urine: Secondary | ICD-10-CM

## 2017-06-24 LAB — CBC WITH DIFFERENTIAL/PLATELET
BASOS ABS: 0 10*3/uL (ref 0.0–0.1)
Basophils Relative: 0.4 % (ref 0.0–3.0)
EOS ABS: 0.1 10*3/uL (ref 0.0–0.7)
Eosinophils Relative: 2.2 % (ref 0.0–5.0)
HEMATOCRIT: 37.8 % (ref 36.0–46.0)
HEMOGLOBIN: 12.6 g/dL (ref 12.0–15.0)
LYMPHS PCT: 46.7 % — AB (ref 12.0–46.0)
Lymphs Abs: 2.7 10*3/uL (ref 0.7–4.0)
MCHC: 33.5 g/dL (ref 30.0–36.0)
MCV: 85.2 fl (ref 78.0–100.0)
MONO ABS: 0.5 10*3/uL (ref 0.1–1.0)
Monocytes Relative: 8.6 % (ref 3.0–12.0)
Neutro Abs: 2.4 10*3/uL (ref 1.4–7.7)
Neutrophils Relative %: 42.1 % — ABNORMAL LOW (ref 43.0–77.0)
PLATELETS: 259 10*3/uL (ref 150.0–400.0)
RBC: 4.44 Mil/uL (ref 3.87–5.11)
RDW: 14.1 % (ref 11.5–15.5)
WBC: 5.8 10*3/uL (ref 4.0–10.5)

## 2017-06-24 LAB — LIPID PANEL
CHOL/HDL RATIO: 4
Cholesterol: 168 mg/dL (ref 0–200)
HDL: 44.2 mg/dL (ref >39.00)
LDL CALC: 96 mg/dL (ref 0–99)
NONHDL: 123.3
Triglycerides: 136 mg/dL (ref 0.0–149.0)
VLDL: 27.2 mg/dL (ref 0.0–40.0)

## 2017-06-24 LAB — BASIC METABOLIC PANEL
BUN: 19 mg/dL (ref 6–23)
CHLORIDE: 106 meq/L (ref 96–112)
CO2: 28 mEq/L (ref 19–32)
Calcium: 9.7 mg/dL (ref 8.4–10.5)
Creatinine, Ser: 1.61 mg/dL — ABNORMAL HIGH (ref 0.40–1.20)
GFR: 40.77 mL/min — AB (ref >60.00)
GLUCOSE: 80 mg/dL (ref 70–99)
POTASSIUM: 4.1 meq/L (ref 3.5–5.1)
SODIUM: 142 meq/L (ref 135–145)

## 2017-06-24 LAB — URINALYSIS, ROUTINE W REFLEX MICROSCOPIC
BILIRUBIN URINE: NEGATIVE
HGB URINE DIPSTICK: NEGATIVE
KETONES UR: NEGATIVE
NITRITE: NEGATIVE
PH: 6 (ref 5.0–8.0)
RBC / HPF: NONE SEEN (ref 0–?)
Specific Gravity, Urine: 1.01 (ref 1.000–1.030)
TOTAL PROTEIN, URINE-UPE24: NEGATIVE
Urine Glucose: NEGATIVE
Urobilinogen, UA: 0.2 (ref 0.0–1.0)

## 2017-06-24 LAB — HEPATIC FUNCTION PANEL
ALT: 15 U/L (ref 0–35)
AST: 17 U/L (ref 0–37)
Albumin: 3.9 g/dL (ref 3.5–5.2)
Alkaline Phosphatase: 89 U/L (ref 39–117)
Bilirubin, Direct: 0.1 mg/dL (ref 0.0–0.3)
TOTAL PROTEIN: 6.7 g/dL (ref 6.0–8.3)
Total Bilirubin: 0.6 mg/dL (ref 0.2–1.2)

## 2017-06-24 LAB — TSH: TSH: 0.03 u[IU]/mL — ABNORMAL LOW (ref 0.35–4.50)

## 2017-06-24 LAB — VITAMIN B12: Vitamin B-12: 1455 pg/mL — ABNORMAL HIGH (ref 211–911)

## 2017-06-24 MED ORDER — LEVOTHYROXINE SODIUM 100 MCG PO TABS: ORAL_TABLET | ORAL | 3 refills | Status: DC

## 2017-06-24 MED ORDER — AMLODIPINE BESYLATE 5 MG PO TABS: 5.0000 mg | ORAL_TABLET | Freq: Every day | ORAL | 3 refills | Status: DC

## 2017-06-24 NOTE — Assessment & Plan Note (Signed)
On B12 

## 2017-06-24 NOTE — Assessment & Plan Note (Signed)
Amlodipine, Losartan 

## 2017-06-24 NOTE — Assessment & Plan Note (Signed)
Labs

## 2017-06-24 NOTE — Progress Notes (Signed)
Subjective:  Patient ID: Christine Morales, female    DOB: Sep 09, 1945  Age: 72 y.o. MRN: [id number]  CC: No chief complaint on file.   HPI Janel W Stillings presents for HTN, B12 def, hypothyroidism f/u  Outpatient Medications Prior to Visit  Medication Sig Dispense Refill  . Aspirin-Salicylamide-Caffeine (BC HEADACHE PO) Take 1 packet by mouth daily as needed (headache).    . Cholecalciferol (VITAMIN D3) 1000 UNITS tablet Take 1,000 Units by mouth daily.      . Cyanocobalamin (VITAMIN B 12 PO) Take 1,000 mcg by mouth daily.    . ferrous sulfate (FERRO-BOB) 325 (65 FE) MG tablet Take 325 mg by mouth daily with breakfast.      . lidocaine-prilocaine (EMLA) cream apply topically if needed APPLY TO PORT-A-CATH 1 TO 2 HOURS PRIOR TO APPT 30 g 1  . losartan (COZAAR) 100 MG tablet take 1 tablet by mouth once daily 90 tablet 3  . amLODipine (NORVASC) 5 MG tablet Take 1 tablet (5 mg total) by mouth daily. Must keep scheduled appt for future refills 30 tablet 0  . levothyroxine (SYNTHROID, LEVOTHROID) 100 MCG tablet take 1 tablet by mouth once daily. Must keep scheduled appt for future refills 30 tablet 0   Facility-Administered Medications Prior to Visit  Medication Dose Route Frequency Provider Last Rate Last Dose  . heparin lock flush 100 unit/mL  500 Units Intravenous Once Shadad, Firas N, MD      . sodium chloride 0.9 % injection 10 mL  10 mL Intravenous PRN Wyatt Portela, MD      . sodium chloride 0.9 % injection 10 mL  10 mL Intravenous PRN Wyatt Portela, MD   10 mL at 05/20/17 0956    ROS Review of Systems  Constitutional: Positive for fatigue. Negative for activity change, appetite change, chills and unexpected weight change.  HENT: Negative for congestion, mouth sores and sinus pressure.   Eyes: Negative for visual disturbance.  Respiratory: Negative for cough and chest tightness.   Gastrointestinal: Negative for abdominal pain and nausea.  Genitourinary: Negative for difficulty  urinating, frequency and vaginal pain.  Musculoskeletal: Positive for arthralgias, back pain and gait problem.  Skin: Negative for pallor and rash.  Neurological: Negative for dizziness, tremors, weakness, numbness and headaches.  Psychiatric/Behavioral: Negative for confusion and sleep disturbance. The patient is nervous/anxious.     Objective:  BP 118/74 (BP Location: Left Arm, Patient Position: Sitting, Cuff Size: Large)   Pulse (!) 57   Temp 97.6 F (36.4 C) (Oral)   Ht 5\' 6"  (1.676 m)   Wt 218 lb (98.9 kg)   SpO2 99%   BMI 35.19 kg/m   BP Readings from Last 3 Encounters:  06/24/17 118/74  05/20/17 124/74  03/16/17 122/80    Wt Readings from Last 3 Encounters:  06/24/17 218 lb (98.9 kg)  05/20/17 216 lb 6.4 oz (98.2 kg)  03/16/17 216 lb (98 kg)    Physical Exam  Constitutional: She appears well-developed. No distress.  HENT:  Head: Normocephalic.  Right Ear: External ear normal.  Left Ear: External ear normal.  Nose: Nose normal.  Mouth/Throat: Oropharynx is clear and moist.  Eyes: Conjunctivae are normal. Pupils are equal, round, and reactive to light. Right eye exhibits no discharge. Left eye exhibits no discharge.  Neck: Normal range of motion. Neck supple. No JVD present. No tracheal deviation present. No thyromegaly present.  Cardiovascular: Normal rate, regular rhythm and normal heart sounds.  Pulmonary/Chest: No stridor. No  respiratory distress. She has no wheezes.  Abdominal: Soft. Bowel sounds are normal. She exhibits no distension and no mass. There is no tenderness. There is no rebound and no guarding.  Musculoskeletal: She exhibits tenderness. She exhibits no edema.  Lymphadenopathy:    She has no cervical adenopathy.  Neurological: She displays normal reflexes. No cranial nerve deficit. She exhibits normal muscle tone. Coordination abnormal.  Skin: No rash noted. No erythema.  Psychiatric: She has a normal mood and affect. Her behavior is normal.  Judgment and thought content normal.    Lab Results  Component Value Date   WBC 4.7 05/20/2017   HGB 12.0 05/20/2017   HCT 36.2 05/20/2017   PLT 239 05/20/2017   GLUCOSE 102 05/20/2017   CHOL 205 (H) 02/12/2015   TRIG 198.0 (H) 02/12/2015   HDL 51.30 02/12/2015   LDLCALC 114 (H) 02/12/2015   ALT 16 05/20/2017   AST 19 05/20/2017   NA 141 05/20/2017   K 4.3 05/20/2017   CL 107 08/11/2016   CREATININE 1.7 (H) 05/20/2017   BUN 21.7 05/20/2017   CO2 23 05/20/2017   TSH 2.26 02/12/2015   INR 1.08 01/27/2010    Ct Abdomen Pelvis Wo Contrast  Result Date: 07/23/2015 CLINICAL DATA:  History of lymphoma.  Left lower quadrant pain EXAM: CT ABDOMEN AND PELVIS WITHOUT CONTRAST TECHNIQUE: Multidetector CT imaging of the abdomen and pelvis was performed following the standard protocol without IV contrast. COMPARISON:  CT abdomen pelvis 03/07/2014 FINDINGS: Lower chest: Lung bases clear without infiltrate or effusion. Heart size normal. Hepatobiliary: The liver is normal in size and contour without focal lesion. Gallbladder and bile ducts normal. Pancreas: Negative Spleen: Negative Adrenals/Urinary Tract: Negative for renal mass or obstruction. No renal calculi. Urinary bladder normal. Stomach/Bowel: Mild to moderate hiatal hernia. No bowel obstruction. Prior bowel resection in the mid small bowel with surgical clips. This is similar to the prior study. Interval development of 2.5 cm cyst in the left abdomen at the level of the iliac crest. This is unchanged from the prior study and may represent a duplication cyst Vascular/Lymphatic: Negative for atherosclerotic disease or aneurysm. No lymphadenopathy. Reproductive: Prior hysterectomy. Other: Ventral hernia containing omentum. This is unchanged from the prior study. No fluid or bowel in the hernia sac. Musculoskeletal: Moderate disc degeneration and spurring at L5-S1. No acute bony abnormality. IMPRESSION: No acute abnormality. Negative for lymphoma  recurrence. Ventral hernia containing fat is unchanged 2.5 cm cyst in the left upper pelvis associated with bowel. 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 Electronically Signed   By: Franchot Gallo M.D.   On: 07/23/2015 11:27    Assessment & Plan:   There are no diagnoses linked to this encounter. I have changed Nathalie W. Desilets's amLODipine and levothyroxine. I am also having her maintain her ferrous sulfate, cholecalciferol, Aspirin-Salicylamide-Caffeine (BC HEADACHE PO), Cyanocobalamin (VITAMIN B 12 PO), lidocaine-prilocaine, and losartan.  Meds ordered this encounter  Medications  . amLODipine (NORVASC) 5 MG tablet    Sig: Take 1 tablet (5 mg total) by mouth daily.    Dispense:  90 tablet    Refill:  3  . levothyroxine (SYNTHROID, LEVOTHROID) 100 MCG tablet    Sig: take 1 tablet by mouth once daily.    Dispense:  90 tablet    Refill:  3     Follow-up: No Follow-up on file.  Walker Kehr, MD

## 2017-06-24 NOTE — Assessment & Plan Note (Signed)
Dr Alen Blew q 6 mo

## 2017-06-30 ENCOUNTER — Other Ambulatory Visit (INDEPENDENT_AMBULATORY_CARE_PROVIDER_SITE_OTHER): Payer: Medicare Other

## 2017-06-30 DIAGNOSIS — R8290 Unspecified abnormal findings in urine: Secondary | ICD-10-CM | POA: Diagnosis not present

## 2017-06-30 LAB — URINALYSIS, ROUTINE W REFLEX MICROSCOPIC
BILIRUBIN URINE: NEGATIVE
KETONES UR: NEGATIVE
Nitrite: NEGATIVE
Specific Gravity, Urine: 1.01 (ref 1.000–1.030)
Total Protein, Urine: NEGATIVE
UROBILINOGEN UA: 0.2 (ref 0.0–1.0)
Urine Glucose: NEGATIVE
pH: 6.5 (ref 5.0–8.0)

## 2017-07-02 ENCOUNTER — Other Ambulatory Visit: Payer: Self-pay | Admitting: Internal Medicine

## 2017-07-02 LAB — URINE CULTURE
MICRO NUMBER: 90028486
SPECIMEN QUALITY: ADEQUATE

## 2017-07-02 MED ORDER — CIPROFLOXACIN HCL 250 MG PO TABS: 250.0000 mg | ORAL_TABLET | Freq: Two times a day (BID) | ORAL | 0 refills | Status: DC

## 2017-07-03 ENCOUNTER — Telehealth: Payer: Self-pay | Admitting: Internal Medicine

## 2017-07-03 NOTE — Telephone Encounter (Signed)
Pt.notified

## 2017-07-03 NOTE — Telephone Encounter (Signed)
Copied from [place]. Topic: General - Other >> Jul 03, 2017 10:59 AM Yvette Rack wrote: Reason for CRM: patient calling about lab results

## 2017-07-15 ENCOUNTER — Inpatient Hospital Stay: Payer: Medicare Other | Attending: Oncology

## 2017-07-15 ENCOUNTER — Other Ambulatory Visit: Payer: Medicare Other

## 2017-07-15 DIAGNOSIS — C8213 Follicular lymphoma grade II, intra-abdominal lymph nodes: Secondary | ICD-10-CM

## 2017-07-15 DIAGNOSIS — Z452 Encounter for adjustment and management of vascular access device: Secondary | ICD-10-CM | POA: Diagnosis not present

## 2017-07-15 DIAGNOSIS — Z8572 Personal history of non-Hodgkin lymphomas: Secondary | ICD-10-CM | POA: Insufficient documentation

## 2017-07-15 MED ORDER — HEPARIN SOD (PORK) LOCK FLUSH 100 UNIT/ML IV SOLN
500.0000 [IU] | Freq: Once | INTRAVENOUS | Status: AC | PRN
  Administered 2017-07-15: 500 [IU] via INTRAVENOUS
  Filled 2017-07-15: qty 5

## 2017-07-15 MED ORDER — SODIUM CHLORIDE 0.9 % IJ SOLN
10.0000 mL | INTRAMUSCULAR | Status: DC | PRN
  Administered 2017-07-15: 10 mL via INTRAVENOUS
  Filled 2017-07-15: qty 10

## 2017-09-03 ENCOUNTER — Ambulatory Visit: Payer: Medicare Other | Admitting: Podiatry

## 2017-09-03 ENCOUNTER — Encounter: Payer: Self-pay | Admitting: Podiatry

## 2017-09-03 ENCOUNTER — Ambulatory Visit (INDEPENDENT_AMBULATORY_CARE_PROVIDER_SITE_OTHER): Payer: Medicare Other

## 2017-09-03 DIAGNOSIS — Q828 Other specified congenital malformations of skin: Secondary | ICD-10-CM

## 2017-09-03 DIAGNOSIS — M7751 Other enthesopathy of right foot: Secondary | ICD-10-CM

## 2017-09-03 DIAGNOSIS — M2011 Hallux valgus (acquired), right foot: Secondary | ICD-10-CM

## 2017-09-05 NOTE — Progress Notes (Signed)
She presents today with a chief complaint of pain to the right fifth metatarsal phalangeal joint area.  States that is been going on now for several months and denies any changes in her past medical history medications or allergies.  Objective: Vital signs are stable alert and oriented x3.  Pulses are palpable.  Neurologic sensorium is intact.  Deep tendon reflexes are intact.  Muscle strength is normal symmetrical bilateral.  Orthopedic evaluation demonstrates all joints distal to the ankle full range of motion without crepitation.  She has a red swollen tender fifth metatarsal phalangeal joint of the right foot with a porokeratotic lesion to the lateral aspect.  Radiographs do not demonstrate any type of acute trauma.  She does have a little bit of a hypertrophic lateral condyle.  Assessment: Capsulitis tailor's bunion deformity bursitis right foot.  Plan: Discussed etiology pathology conservative versus surgical therapies.  Debrided reactive hyperkeratotic lesion today.  I also injected 2 mg of dexamethasone and local anesthetic after sterile Betadine skin prep and consent to the bursa of the fifth metatarsal phalangeal joint.  She tolerated procedure well without complications we discussed appropriate shoe gear and that she should not wear a narrow shoe that may cause a result in a reactive hyperkeratotic lesion.  I will follow-up with her in 1 month if necessary.

## 2017-09-16 ENCOUNTER — Inpatient Hospital Stay: Payer: Medicare Other | Attending: Oncology

## 2017-09-16 DIAGNOSIS — Z452 Encounter for adjustment and management of vascular access device: Secondary | ICD-10-CM | POA: Diagnosis not present

## 2017-09-16 DIAGNOSIS — Z8572 Personal history of non-Hodgkin lymphomas: Secondary | ICD-10-CM | POA: Diagnosis present

## 2017-09-16 DIAGNOSIS — C8213 Follicular lymphoma grade II, intra-abdominal lymph nodes: Secondary | ICD-10-CM

## 2017-09-16 MED ORDER — SODIUM CHLORIDE 0.9 % IJ SOLN
10.0000 mL | INTRAMUSCULAR | Status: DC | PRN
  Administered 2017-09-16: 10 mL via INTRAVENOUS
  Filled 2017-09-16: qty 10

## 2017-09-16 MED ORDER — HEPARIN SOD (PORK) LOCK FLUSH 100 UNIT/ML IV SOLN
500.0000 [IU] | Freq: Once | INTRAVENOUS | Status: AC | PRN
  Administered 2017-09-16: 500 [IU] via INTRAVENOUS
  Filled 2017-09-16: qty 5

## 2017-10-17 ENCOUNTER — Other Ambulatory Visit: Payer: Self-pay | Admitting: Internal Medicine

## 2017-10-21 ENCOUNTER — Encounter: Payer: Self-pay | Admitting: Internal Medicine

## 2017-10-21 ENCOUNTER — Ambulatory Visit: Payer: Medicare Other | Admitting: Internal Medicine

## 2017-10-21 ENCOUNTER — Other Ambulatory Visit (INDEPENDENT_AMBULATORY_CARE_PROVIDER_SITE_OTHER): Payer: Medicare Other

## 2017-10-21 VITALS — BP 112/74 | HR 62 | Temp 97.9°F | Ht 66.0 in | Wt 223.0 lb

## 2017-10-21 DIAGNOSIS — I10 Essential (primary) hypertension: Secondary | ICD-10-CM

## 2017-10-21 DIAGNOSIS — R42 Dizziness and giddiness: Secondary | ICD-10-CM | POA: Insufficient documentation

## 2017-10-21 DIAGNOSIS — N189 Chronic kidney disease, unspecified: Secondary | ICD-10-CM | POA: Diagnosis not present

## 2017-10-21 DIAGNOSIS — E039 Hypothyroidism, unspecified: Secondary | ICD-10-CM

## 2017-10-21 DIAGNOSIS — Z23 Encounter for immunization: Secondary | ICD-10-CM | POA: Diagnosis not present

## 2017-10-21 DIAGNOSIS — E538 Deficiency of other specified B group vitamins: Secondary | ICD-10-CM | POA: Diagnosis not present

## 2017-10-21 DIAGNOSIS — C8233 Follicular lymphoma grade IIIa, intra-abdominal lymph nodes: Secondary | ICD-10-CM | POA: Diagnosis not present

## 2017-10-21 LAB — BASIC METABOLIC PANEL
BUN: 22 mg/dL (ref 6–23)
CALCIUM: 9.3 mg/dL (ref 8.4–10.5)
CO2: 27 mEq/L (ref 19–32)
Chloride: 106 mEq/L (ref 96–112)
Creatinine, Ser: 1.8 mg/dL — ABNORMAL HIGH (ref 0.40–1.20)
GFR: 35.81 mL/min — AB (ref >60.00)
GLUCOSE: 81 mg/dL (ref 70–99)
POTASSIUM: 4.2 meq/L (ref 3.5–5.1)
SODIUM: 142 meq/L (ref 135–145)

## 2017-10-21 LAB — TSH: TSH: 0.09 u[IU]/mL — AB (ref 0.35–4.50)

## 2017-10-21 LAB — T4, FREE: Free T4: 0.61 ng/dL (ref 0.60–1.60)

## 2017-10-21 MED ORDER — MECLIZINE HCL 12.5 MG PO TABS: 12.5000 mg | ORAL_TABLET | Freq: Three times a day (TID) | ORAL | 1 refills | Status: DC | PRN

## 2017-10-21 MED ORDER — LEVOTHYROXINE SODIUM 100 MCG PO TABS: ORAL_TABLET | ORAL | 3 refills | Status: DC

## 2017-10-21 NOTE — Assessment & Plan Note (Signed)
BMET Dr Justin Mend

## 2017-10-21 NOTE — Patient Instructions (Signed)
Benign Positional Vertigo symptoms on the right. Start Meclizine. Start Laruth Bouchard - Daroff exercise several times a day as dirrected.     Vertigo Vertigo is the feeling that you or your surroundings are moving when they are not. Vertigo can be dangerous if it occurs while you are doing something that could endanger you or others, such as driving. What are the causes? This condition is caused by a disturbance in the signals that are sent by your body's sensory systems to your brain. Different causes of a disturbance can lead to vertigo, including:  Infections, especially in the inner ear.  A bad reaction to a drug, or misuse of alcohol and medicines.  Withdrawal from drugs or alcohol.  Quickly changing positions, as when lying down or rolling over in bed.  Migraine headaches.  Decreased blood flow to the brain.  Decreased blood pressure.  Increased pressure in the brain from a head or neck injury, stroke, infection, tumor, or bleeding.  Central nervous system disorders.  What are the signs or symptoms? Symptoms of this condition usually occur when you move your head or your eyes in different directions. Symptoms may start suddenly, and they usually last for less than a minute. Symptoms may include:  Loss of balance and falling.  Feeling like you are spinning or moving.  Feeling like your surroundings are spinning or moving.  Nausea and vomiting.  Blurred vision or double vision.  Difficulty hearing.  Slurred speech.  Dizziness.  Involuntary eye movement (nystagmus).  Symptoms can be mild and cause only slight annoyance, or they can be severe and interfere with daily life. Episodes of vertigo may return (recur) over time, and they are often triggered by certain movements. Symptoms may improve over time. How is this diagnosed? This condition may be diagnosed based on medical history and the quality of your nystagmus. Your health care provider may test your eye movements  by asking you to quickly change positions to trigger the nystagmus. This may be called the Dix-Hallpike test, head thrust test, or roll test. You may be referred to a health care provider who specializes in ear, nose, and throat (ENT) problems (otolaryngologist) or a provider who specializes in disorders of the central nervous system (neurologist). You may have additional testing, including:  A physical exam.  Blood tests.  MRI.  A CT scan.  An electrocardiogram (ECG). This records electrical activity in your heart.  An electroencephalogram (EEG). This records electrical activity in your brain.  Hearing tests.  How is this treated? Treatment for this condition depends on the cause and the severity of the symptoms. Treatment options include:  Medicines to treat nausea or vertigo. These are usually used for severe cases. Some medicines that are used to treat other conditions may also reduce or eliminate vertigo symptoms. These include: ? Medicines that control allergies (antihistamines). ? Medicines that control seizures (anticonvulsants). ? Medicines that relieve depression (antidepressants). ? Medicines that relieve anxiety (sedatives).  Head movements to adjust your inner ear back to normal. If your vertigo is caused by an ear problem, your health care provider may recommend certain movements to correct the problem.  Surgery. This is rare.  Follow these instructions at home: Safety  Move slowly.Avoid sudden body or head movements.  Avoid driving.  Avoid operating heavy machinery.  Avoid doing any tasks that would cause danger to you or others if you would have a vertigo episode during the task.  If you have trouble walking or keeping your balance, try using  a cane for stability. If you feel dizzy or unstable, sit down right away.  Return to your normal activities as told by your health care provider. Ask your health care provider what activities are safe for you. General  instructions  Take over-the-counter and prescription medicines only as told by your health care provider.  Avoid certain positions or movements as told by your health care provider.  Drink enough fluid to keep your urine clear or pale yellow.  Keep all follow-up visits as told by your health care provider. This is important. Contact a health care provider if:  Your medicines do not relieve your vertigo or they make it worse.  You have a fever.  Your condition gets worse or you develop new symptoms.  Your family or friends notice any behavioral changes.  Your nausea or vomiting gets worse.  You have numbness or a "pins and needles" sensation in part of your body. Get help right away if:  You have difficulty moving or speaking.  You are always dizzy.  You faint.  You develop severe headaches.  You have weakness in your hands, arms, or legs.  You have changes in your hearing or vision.  You develop a stiff neck.  You develop sensitivity to light. This information is not intended to replace advice given to you by your health care provider. Make sure you discuss any questions you have with your health care provider. Document Released: 03/19/2005 Document Revised: 11/21/2015 Document Reviewed: 10/02/2014 Elsevier Interactive Patient Education  [employer].

## 2017-10-21 NOTE — Assessment & Plan Note (Addendum)
Labs Amlodipine, Losartan

## 2017-10-21 NOTE — Assessment & Plan Note (Signed)
On B12 

## 2017-10-21 NOTE — Assessment & Plan Note (Addendum)
BMI 35.99 stop sweet drinks; low carb diet

## 2017-10-21 NOTE — Progress Notes (Signed)
Subjective:  Patient ID: Christine Morales, female    DOB: [birth year]  Age: 72 y.o. MRN: [id number]  CC: No chief complaint on file.   HPI Christine Morales presents for HTN, anemia, hypothyroidism C/o dizziness - mild since Thursday off and on  Outpatient Medications Prior to Visit  Medication Sig Dispense Refill  . amLODipine (NORVASC) 5 MG tablet Take 1 tablet (5 mg total) by mouth daily. 90 tablet 3  . Aspirin-Salicylamide-Caffeine (BC HEADACHE PO) Take 1 packet by mouth daily as needed (headache).    . Cholecalciferol (VITAMIN D3) 1000 UNITS tablet Take 1,000 Units by mouth daily.      . Cyanocobalamin (VITAMIN B 12 PO) Take 1,000 mcg by mouth daily.    . ferrous sulfate (FERRO-BOB) 325 (65 FE) MG tablet Take 325 mg by mouth daily with breakfast.      . levothyroxine (SYNTHROID, LEVOTHROID) 100 MCG tablet take 1 tablet by mouth once daily. 90 tablet 3  . lidocaine-prilocaine (EMLA) cream apply topically if needed APPLY TO PORT-A-CATH 1 TO 2 HOURS PRIOR TO APPT 30 g 1  . losartan (COZAAR) 100 MG tablet take 1 tablet by mouth once daily 90 tablet 3   Facility-Administered Medications Prior to Visit  Medication Dose Route Frequency Provider Last Rate Last Dose  . heparin lock flush 100 unit/mL  500 Units Intravenous Once Shadad, Firas N, MD      . sodium chloride 0.9 % injection 10 mL  10 mL Intravenous PRN Wyatt Portela, MD      . sodium chloride 0.9 % injection 10 mL  10 mL Intravenous PRN Wyatt Portela, MD   10 mL at 05/20/17 0956    ROS Review of Systems  Constitutional: Positive for unexpected weight change. Negative for activity change, appetite change, chills, diaphoresis, fatigue and fever.  HENT: Negative for congestion, ear pain, facial swelling, hearing loss, mouth sores, nosebleeds, postnasal drip, rhinorrhea, sinus pressure, sneezing, sore throat, tinnitus and trouble swallowing.   Eyes: Negative for pain, discharge, redness, itching and visual disturbance.  Respiratory:  Negative for cough, chest tightness, shortness of breath, wheezing and stridor.   Cardiovascular: Negative for chest pain, palpitations and leg swelling.  Gastrointestinal: Negative for abdominal distention, anal bleeding, blood in stool, constipation, diarrhea, nausea and rectal pain.  Genitourinary: Negative for difficulty urinating, dysuria, flank pain, frequency, genital sores, hematuria, pelvic pain, urgency, vaginal bleeding and vaginal discharge.  Musculoskeletal: Negative for arthralgias, back pain, gait problem, joint swelling, neck pain and neck stiffness.  Skin: Negative.  Negative for rash.  Neurological: Negative for dizziness, tremors, seizures, syncope, speech difficulty, weakness, numbness and headaches.  Hematological: Negative for adenopathy. Does not bruise/bleed easily.  Psychiatric/Behavioral: Negative for behavioral problems, decreased concentration, dysphoric mood, sleep disturbance and suicidal ideas. The patient is not nervous/anxious.     Objective:  BP 112/74 (BP Location: Left Arm, Patient Position: Sitting, Cuff Size: Large)   Pulse 62   Temp 97.9 F (36.6 C) (Oral)   Ht 5\' 6"  (1.676 m)   Wt 223 lb (101.2 kg)   SpO2 99%   BMI 35.99 kg/m   BP Readings from Last 3 Encounters:  10/21/17 112/74  06/24/17 118/74  05/20/17 124/74    Wt Readings from Last 3 Encounters:  10/21/17 223 lb (101.2 kg)  06/24/17 218 lb (98.9 kg)  05/20/17 216 lb 6.4 oz (98.2 kg)    Physical Exam  Constitutional: She appears well-developed. No distress.  HENT:  Head: Normocephalic.  Right  Ear: External ear normal.  Left Ear: External ear normal.  Nose: Nose normal.  Mouth/Throat: Oropharynx is clear and moist.  Eyes: Pupils are equal, round, and reactive to light. Conjunctivae are normal. Right eye exhibits no discharge. Left eye exhibits no discharge.  Neck: Normal range of motion. Neck supple. No JVD present. No tracheal deviation present. No thyromegaly present.    Cardiovascular: Normal rate, regular rhythm and normal heart sounds.  Pulmonary/Chest: No stridor. No respiratory distress. She has no wheezes.  Abdominal: Soft. Bowel sounds are normal. She exhibits no distension and no mass. There is no tenderness. There is no rebound and no guarding.  Musculoskeletal: She exhibits no edema or tenderness.  Lymphadenopathy:    She has no cervical adenopathy.  Neurological: She displays normal reflexes. No cranial nerve deficit. She exhibits normal muscle tone. Coordination normal.  Skin: No rash noted. No erythema.  Psychiatric: She has a normal mood and affect. Her behavior is normal. Judgment and thought content normal.   Obese Port H-P (+) on the R  Lab Results  Component Value Date   WBC 5.8 06/24/2017   HGB 12.6 06/24/2017   HCT 37.8 06/24/2017   PLT 259.0 06/24/2017   GLUCOSE 80 06/24/2017   CHOL 168 06/24/2017   TRIG 136.0 06/24/2017   HDL 44.20 06/24/2017   LDLCALC 96 06/24/2017   ALT 15 06/24/2017   AST 17 06/24/2017   NA 142 06/24/2017   K 4.1 06/24/2017   CL 106 06/24/2017   CREATININE 1.61 (H) 06/24/2017   BUN 19 06/24/2017   CO2 28 06/24/2017   TSH 0.03 (L) 06/24/2017   INR 1.08 01/27/2010    Ct Abdomen Pelvis Wo Contrast  Result Date: 07/23/2015 CLINICAL DATA:  History of lymphoma.  Left lower quadrant pain EXAM: CT ABDOMEN AND PELVIS WITHOUT CONTRAST TECHNIQUE: Multidetector CT imaging of the abdomen and pelvis was performed following the standard protocol without IV contrast. COMPARISON:  CT abdomen pelvis 03/07/2014 FINDINGS: Lower chest: Lung bases clear without infiltrate or effusion. Heart size normal. Hepatobiliary: The liver is normal in size and contour without focal lesion. Gallbladder and bile ducts normal. Pancreas: Negative Spleen: Negative Adrenals/Urinary Tract: Negative for renal mass or obstruction. No renal calculi. Urinary bladder normal. Stomach/Bowel: Mild to moderate hiatal hernia. No bowel obstruction.  Prior bowel resection in the mid small bowel with surgical clips. This is similar to the prior study. Interval development of 2.5 cm cyst in the left abdomen at the level of the iliac crest. This is unchanged from the prior study and may represent a duplication cyst Vascular/Lymphatic: Negative for atherosclerotic disease or aneurysm. No lymphadenopathy. Reproductive: Prior hysterectomy. Other: Ventral hernia containing omentum. This is unchanged from the prior study. No fluid or bowel in the hernia sac. Musculoskeletal: Moderate disc degeneration and spurring at L5-S1. No acute bony abnormality. IMPRESSION: No acute abnormality. Negative for lymphoma recurrence. Ventral hernia containing fat is unchanged 2.5 cm cyst in the left upper pelvis associated with bowel. 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 Electronically Signed   By: Franchot Gallo M.D.   On: 07/23/2015 11:27    Assessment & Plan:   There are no diagnoses linked to this encounter. I am having Christine Morales maintain her ferrous sulfate, cholecalciferol, Aspirin-Salicylamide-Caffeine (BC HEADACHE PO), Cyanocobalamin (VITAMIN B 12 PO), lidocaine-prilocaine, losartan, amLODipine, and levothyroxine.  No orders of the defined types were placed in this encounter.    Follow-up: No follow-ups on file.  Walker Kehr, MD

## 2017-10-21 NOTE — Assessment & Plan Note (Signed)
Benign Positional Vertigo symptoms on the right. 2019 Start Meclizine. Start Laruth Bouchard - Daroff exercise several times a day as dirrected. No driving if dizzy

## 2017-10-21 NOTE — Assessment & Plan Note (Signed)
F/u w/Dr Shadad 

## 2017-10-23 ENCOUNTER — Telehealth: Payer: Self-pay

## 2017-10-23 NOTE — Telephone Encounter (Signed)
PA approved.

## 2017-10-23 NOTE — Telephone Encounter (Signed)
Key: [id number]

## 2017-11-18 ENCOUNTER — Telehealth: Payer: Self-pay | Admitting: Oncology

## 2017-11-18 ENCOUNTER — Inpatient Hospital Stay: Payer: Medicare Other | Attending: Oncology | Admitting: Oncology

## 2017-11-18 ENCOUNTER — Inpatient Hospital Stay: Payer: Medicare Other

## 2017-11-18 VITALS — BP 120/69 | HR 61 | Temp 97.6°F | Resp 18 | Ht 66.0 in

## 2017-11-18 DIAGNOSIS — Z452 Encounter for adjustment and management of vascular access device: Secondary | ICD-10-CM | POA: Insufficient documentation

## 2017-11-18 DIAGNOSIS — N289 Disorder of kidney and ureter, unspecified: Secondary | ICD-10-CM | POA: Diagnosis not present

## 2017-11-18 DIAGNOSIS — Z8572 Personal history of non-Hodgkin lymphomas: Secondary | ICD-10-CM | POA: Diagnosis not present

## 2017-11-18 DIAGNOSIS — C8213 Follicular lymphoma grade II, intra-abdominal lymph nodes: Secondary | ICD-10-CM

## 2017-11-18 DIAGNOSIS — C8233 Follicular lymphoma grade IIIa, intra-abdominal lymph nodes: Secondary | ICD-10-CM

## 2017-11-18 LAB — COMPREHENSIVE METABOLIC PANEL
ALBUMIN: 3.8 g/dL (ref 3.5–5.0)
ALK PHOS: 87 U/L (ref 40–150)
ALT: 19 U/L (ref 0–55)
ANION GAP: 10 (ref 3–11)
AST: 19 U/L (ref 5–34)
BILIRUBIN TOTAL: 0.5 mg/dL (ref 0.2–1.2)
BUN: 24 mg/dL (ref 7–26)
CALCIUM: 9.2 mg/dL (ref 8.4–10.4)
CO2: 23 mmol/L (ref 22–29)
CREATININE: 1.83 mg/dL — AB (ref 0.60–1.10)
Chloride: 109 mmol/L (ref 98–109)
GFR calc Af Amer: 31 mL/min — ABNORMAL LOW (ref >60)
GFR calc non Af Amer: 27 mL/min — ABNORMAL LOW (ref >60)
GLUCOSE: 91 mg/dL (ref 70–140)
Potassium: 4.1 mmol/L (ref 3.5–5.1)
Sodium: 142 mmol/L (ref 136–145)
TOTAL PROTEIN: 6.7 g/dL (ref 6.4–8.3)

## 2017-11-18 LAB — CBC WITH DIFFERENTIAL/PLATELET
BASOS PCT: 0 %
Basophils Absolute: 0 10*3/uL (ref 0.0–0.1)
Eosinophils Absolute: 0.1 10*3/uL (ref 0.0–0.5)
Eosinophils Relative: 3 %
HEMATOCRIT: 37.1 % (ref 34.8–46.6)
Hemoglobin: 12.1 g/dL (ref 11.6–15.9)
LYMPHS ABS: 2.6 10*3/uL (ref 0.9–3.3)
Lymphocytes Relative: 52 %
MCH: 28.1 pg (ref 25.1–34.0)
MCHC: 32.6 g/dL (ref 31.5–36.0)
MCV: 86.3 fL (ref 79.5–101.0)
MONOS PCT: 8 %
Monocytes Absolute: 0.4 10*3/uL (ref 0.1–0.9)
NEUTROS ABS: 1.9 10*3/uL (ref 1.5–6.5)
NEUTROS PCT: 37 %
Platelets: 221 10*3/uL (ref 145–400)
RBC: 4.3 MIL/uL (ref 3.70–5.45)
RDW: 14.6 % — ABNORMAL HIGH (ref 11.2–14.5)
WBC: 5 10*3/uL (ref 3.9–10.3)

## 2017-11-18 MED ORDER — HEPARIN SOD (PORK) LOCK FLUSH 100 UNIT/ML IV SOLN
500.0000 [IU] | Freq: Once | INTRAVENOUS | Status: AC | PRN
  Administered 2017-11-18: 500 [IU] via INTRAVENOUS
  Filled 2017-11-18: qty 5

## 2017-11-18 MED ORDER — SODIUM CHLORIDE 0.9 % IJ SOLN
10.0000 mL | INTRAMUSCULAR | Status: DC | PRN
  Administered 2017-11-18: 10 mL via INTRAVENOUS
  Filled 2017-11-18: qty 10

## 2017-11-18 NOTE — Progress Notes (Signed)
Hematology and Oncology Follow Up Visit  Christine Morales [id number] Jan 16, 1946 72 y.o. 11/18/2017 11:23 AM  Principle Diagnosis: 72 year old female with stage IIA follicular lymphoma diagnosed in July 2011.  Prior Therapy:   She is S/P 5 cycles of bendamustine and rituximab, finished in December 2011. She had a complete response at that time.  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  Current therapy: Active surveillance.   Interim History:  Mrs. Christine Morales is here for a follow-up visit.  Since last visit, she reports no changes in her health.  She denies any complaints including abdominal distention or early satiety.  She denies any lymphadenopathy or excessive fatigue.  Continues to attend activities of daily living without any decline in ability to do so. Denies any recurrent infections or easy bruising.  Her quality of life and performance status remain excellent.  She does not report any headaches, blurry vision, syncope or seizures. Does not report any fevers, chills or sweats.  Does not report any cough, wheezing or hemoptysis.  Does not report any chest pain, palpitation, orthopnea or leg edema.  Does not report any nausea, vomiting or abdominal pain.  Does not report any constipation or diarrhea.   Does not report frequency, urgency or hematuria.  Does not report any skin rashes or lesions.  Remaining review of systems is negative.    Medications: I have reviewed the patient's current medications.  Unchanged. Current Outpatient Prescriptions  Medication Sig Dispense Refill  . amLODipine (NORVASC) 5 MG tablet take 1 tablet by mouth once daily 90 tablet 1  . Aspirin-Salicylamide-Caffeine (BC HEADACHE PO) Take 1 packet by mouth daily as needed (headache).    . Cholecalciferol (VITAMIN D3) 1000 UNITS tablet Take 1,000 Units by mouth daily.      . Cyanocobalamin  (VITAMIN B 12 PO) Take 1,000 mcg by mouth daily.    . ferrous sulfate (FERRO-BOB) 325 (65 FE) MG tablet Take 325 mg by mouth daily with breakfast.      . levothyroxine (SYNTHROID, LEVOTHROID) 100 MCG tablet take 1 tablet by mouth once daily   30 tablet 11  . lidocaine-prilocaine (EMLA) cream Apply 1 application topically as needed. Apply to port-a-cath site 1 to 2 hours prior to appt. 30 g 1  . losartan (COZAAR) 100 MG tablet take 1 tablet by mouth once daily 90 tablet 1  . meloxicam (MOBIC) 15 MG tablet Take 1 tablet (15 mg total) by mouth daily. 30 tablet 3  . NEOMYCIN-POLYMYXIN-HYDROCORTISONE (CORTISPORIN) 1 % SOLN otic solution Apply 1-2 drops to toe BID after soaking 10 mL 1   No current facility-administered medications for this visit.    Facility-Administered Medications Ordered in Other Visits  Medication Dose Route Frequency Provider Last Rate Last Dose  . heparin lock flush 100 unit/mL  500 Units Intravenous Once Wyatt Portela, MD      . sodium chloride 0.9 % injection 10 mL  10 mL Intravenous PRN Wyatt Portela, MD        Allergies: No Known Allergies  Physical Exam: Blood pressure 120/69, pulse 61, temperature 97.6 F (36.4 C), temperature source Oral, resp. rate 18, height 5\' 6"  (1.676 m), SpO2 100 %. ECOG: 1 General appearance: Alert, awake woman without distress. Head: Atraumatic without abnormalities. Oropharynx: Without any thrush or ulcers. Eyes: No scleral icterus. Lymph nodes: Cervical, supraclavicular, and axillary nodes normal. Heart:regular rate and  rhythm, no murmurs or gallops. Lung: Clear to auscultation without any rhonchi, wheezes or dullness to percussion. Abdomin: Soft, nontender without any rebound or guarding. Musculoskeletal: No joint deformity or effusion. Skin: No rashes or lesions.   Lab Results: Lab Results  Component Value Date   WBC 5.0 11/18/2017   HGB 12.1 11/18/2017   HCT 37.1 11/18/2017   MCV 86.3 11/18/2017   PLT 221 11/18/2017       Impression and Plan:   72 year old female with the following issues:  1. Stage IIA follicular lymphoma diagnosed in 2011.  She is status post systemic chemotherapy and achieved complete response since that time.  She has no evidence to suggest recurrent disease.  The natural course of this disease was reviewed today including the risk of relapse.  After discussion today, I have recommended continue active surveillance and repeat laboratory testing and physical exam in 6 months.  Plan is to repeat imaging studies to develop any symptoms. 2. Port-A-Cath management.  Risks and benefits of a Port-A-Cath removal was discussed today.  She prefers to have it removed at this time.  We will arrange for that to be done in the near future. 3.     Renal insufficiency: Creatinine remains stable.  Her renal insufficiency is related to long-standing hypertension. 4.     Follow up: in 6 months for a clinical visit.   15  minutes was spent with the patient face-to-face today.  More than 50% of time was dedicated to patient counseling, education and coordination of her care.    Zola Button, MD 5/29/201911:23 AM

## 2017-11-18 NOTE — Telephone Encounter (Signed)
Appointments scheduled AVS/Calendar printed per 5/29 los °

## 2017-12-14 ENCOUNTER — Other Ambulatory Visit: Payer: Self-pay | Admitting: Radiology

## 2017-12-15 ENCOUNTER — Encounter (HOSPITAL_COMMUNITY): Payer: Self-pay

## 2017-12-15 ENCOUNTER — Ambulatory Visit (HOSPITAL_COMMUNITY)
Admission: RE | Admit: 2017-12-15 | Discharge: 2017-12-15 | Disposition: A | Discharge status: Home / Self Care | Payer: Medicare Other | Source: Ambulatory Visit | Attending: Oncology | Admitting: Oncology

## 2017-12-15 DIAGNOSIS — C8213 Follicular lymphoma grade II, intra-abdominal lymph nodes: Secondary | ICD-10-CM

## 2017-12-15 DIAGNOSIS — Z452 Encounter for adjustment and management of vascular access device: Secondary | ICD-10-CM | POA: Diagnosis present

## 2017-12-15 DIAGNOSIS — I10 Essential (primary) hypertension: Secondary | ICD-10-CM | POA: Diagnosis not present

## 2017-12-15 DIAGNOSIS — Z9049 Acquired absence of other specified parts of digestive tract: Secondary | ICD-10-CM | POA: Insufficient documentation

## 2017-12-15 DIAGNOSIS — C8290 Follicular lymphoma, unspecified, unspecified site: Secondary | ICD-10-CM | POA: Insufficient documentation

## 2017-12-15 DIAGNOSIS — Z9221 Personal history of antineoplastic chemotherapy: Secondary | ICD-10-CM | POA: Diagnosis not present

## 2017-12-15 HISTORY — PX: IR REMOVAL TUN ACCESS W/ PORT W/O FL MOD SED: IMG2290

## 2017-12-15 LAB — BASIC METABOLIC PANEL
ANION GAP: 6 (ref 5–15)
BUN: 19 mg/dL (ref 8–23)
CHLORIDE: 112 mmol/L — AB (ref 98–111)
CO2: 27 mmol/L (ref 22–32)
Calcium: 9.6 mg/dL (ref 8.9–10.3)
Creatinine, Ser: 1.65 mg/dL — ABNORMAL HIGH (ref 0.44–1.00)
GFR calc Af Amer: 36 mL/min — ABNORMAL LOW (ref >60)
GFR calc non Af Amer: 31 mL/min — ABNORMAL LOW (ref >60)
GLUCOSE: 100 mg/dL — AB (ref 70–99)
POTASSIUM: 4.7 mmol/L (ref 3.5–5.1)
Sodium: 145 mmol/L (ref 135–145)

## 2017-12-15 LAB — CBC WITH DIFFERENTIAL/PLATELET
Basophils Absolute: 0 10*3/uL (ref 0.0–0.1)
Basophils Relative: 0 %
Eosinophils Absolute: 0.1 10*3/uL (ref 0.0–0.7)
Eosinophils Relative: 3 %
HEMATOCRIT: 39.4 % (ref 36.0–46.0)
Hemoglobin: 13.2 g/dL (ref 12.0–15.0)
LYMPHS ABS: 2.7 10*3/uL (ref 0.7–4.0)
LYMPHS PCT: 51 %
MCH: 29.1 pg (ref 26.0–34.0)
MCHC: 33.5 g/dL (ref 30.0–36.0)
MCV: 87 fL (ref 78.0–100.0)
MONO ABS: 0.4 10*3/uL (ref 0.1–1.0)
MONOS PCT: 8 %
NEUTROS ABS: 1.9 10*3/uL (ref 1.7–7.7)
Neutrophils Relative %: 38 %
Platelets: 228 10*3/uL (ref 150–400)
RBC: 4.53 MIL/uL (ref 3.87–5.11)
RDW: 14 % (ref 11.5–15.5)
WBC: 5.2 10*3/uL (ref 4.0–10.5)

## 2017-12-15 LAB — PROTIME-INR
INR: 0.94
Prothrombin Time: 12.5 seconds (ref 11.4–15.2)

## 2017-12-15 MED ORDER — CEFAZOLIN SODIUM-DEXTROSE 2-4 GM/100ML-% IV SOLN
INTRAVENOUS | Status: AC
  Filled 2017-12-15: qty 100

## 2017-12-15 MED ORDER — LIDOCAINE-EPINEPHRINE (PF) 2 %-1:200000 IJ SOLN
INTRAMUSCULAR | Status: AC
  Filled 2017-12-15: qty 20

## 2017-12-15 MED ORDER — FLUMAZENIL 0.5 MG/5ML IV SOLN
INTRAVENOUS | Status: AC
  Filled 2017-12-15: qty 5

## 2017-12-15 MED ORDER — MIDAZOLAM HCL 2 MG/2ML IJ SOLN
INTRAMUSCULAR | Status: AC
  Filled 2017-12-15: qty 2

## 2017-12-15 MED ORDER — SODIUM CHLORIDE 0.9 % IV SOLN
INTRAVENOUS | Status: DC
  Administered 2017-12-15: 08:00:00 via INTRAVENOUS

## 2017-12-15 MED ORDER — FENTANYL CITRATE (PF) 100 MCG/2ML IJ SOLN
INTRAMUSCULAR | Status: AC
  Filled 2017-12-15: qty 2

## 2017-12-15 MED ORDER — CEFAZOLIN SODIUM-DEXTROSE 2-4 GM/100ML-% IV SOLN
2.0000 g | INTRAVENOUS | Status: AC
  Administered 2017-12-15: 2 g via INTRAVENOUS

## 2017-12-15 MED ORDER — FENTANYL CITRATE (PF) 100 MCG/2ML IJ SOLN
INTRAMUSCULAR | Status: AC | PRN
  Administered 2017-12-15 (×2): 50 ug via INTRAVENOUS

## 2017-12-15 MED ORDER — NALOXONE HCL 0.4 MG/ML IJ SOLN
INTRAMUSCULAR | Status: AC
  Filled 2017-12-15: qty 1

## 2017-12-15 MED ORDER — MIDAZOLAM HCL 2 MG/2ML IJ SOLN
INTRAMUSCULAR | Status: AC | PRN
  Administered 2017-12-15 (×2): 1 mg via INTRAVENOUS

## 2017-12-15 NOTE — Discharge Instructions (Signed)
Implanted Port Removal, Care After °Refer to this sheet in the next few weeks. These instructions provide you with information about caring for yourself after your procedure. Your health care provider may also give you more specific instructions. Your treatment has been planned according to current medical practices, but problems sometimes occur. Call your health care provider if you have any problems or questions after your procedure. °What can I expect after the procedure? °After the procedure, it is common to have: °· Soreness or pain near your incision. °· Some swelling or bruising near your incision. ° °Follow these instructions at home: °Medicines °· Take over-the-counter and prescription medicines only as told by your health care provider. °· If you were prescribed an antibiotic medicine, take it as told by your health care provider. Do not stop taking the antibiotic even if you start to feel better. °Bathing °· Do not take baths, swim, or use a hot tub until your health care provider approves. Ask your health care provider if you can take showers. You may only be allowed to take sponge baths for bathing. °Incision care °· Follow instructions from your health care provider about how to take care of your incision. Make sure you: °? Wash your hands with soap and water before you change your bandage (dressing). If soap and water are not available, use hand sanitizer. °? Change your dressing as told by your health care provider. °? Keep your dressing dry. °? Leave stitches (sutures), skin glue, or adhesive strips in place. These skin closures may need to stay in place for 2 weeks or longer. If adhesive strip edges start to loosen and curl up, you may trim the loose edges. Do not remove adhesive strips completely unless your health care provider tells you to do that. °· Check your incision area every day for signs of infection. Check for: °? More redness, swelling, or pain. °? More fluid or  blood. °? Warmth. °? Pus or a bad smell. °Driving °· If you received a sedative, do not drive for 24 hours after the procedure. °· If you did not receive a sedative, ask your health care provider when it is safe to drive. °Activity °· Return to your normal activities as told by your health care provider. Ask your health care provider what activities are safe for you. °· Until your health care provider says it is safe: °? Do not lift anything that is heavier than 10 lb (4.5 kg). °? Do not do activities that involve lifting your arms over your head. °General instructions °· Do not use any tobacco products, such as cigarettes, chewing tobacco, and e-cigarettes. Tobacco can delay healing. If you need help quitting, ask your health care provider. °· Keep all follow-up visits as told by your health care provider. This is important. °Contact a health care provider if: °· You have more redness, swelling, or pain around your incision. °· You have more fluid or blood coming from your incision. °· Your incision feels warm to the touch. °· You have pus or a bad smell coming from your incision. °· You have a fever. °· You have pain that is not relieved by your pain medicine. °Get help right away if: °· You have chest pain. °· You have difficulty breathing. °This information is not intended to replace advice given to you by your health care provider. Make sure you discuss any questions you have with your health care provider. °Document Released: 05/21/2015 Document Revised: 11/15/2015 Document Reviewed: 03/14/2015 °Elsevier Interactive Patient   Education © 2018 [employer] °Moderate Conscious Sedation, Adult, Care After °These instructions provide you with information about caring for yourself after your procedure. Your health care provider may also give you more specific instructions. Your treatment has been planned according to current medical practices, but problems sometimes occur. Call your health care provider if you have  any problems or questions after your procedure. °What can I expect after the procedure? °After your procedure, it is common: °· To feel sleepy for several hours. °· To feel clumsy and have poor balance for several hours. °· To have poor judgment for several hours. °· To vomit if you eat too soon. ° °Follow these instructions at home: °For at least 24 hours after the procedure: ° °· Do not: °? Participate in activities where you could fall or become injured. °? Drive. °? Use heavy machinery. °? Drink alcohol. °? Take sleeping pills or medicines that cause drowsiness. °? Make important decisions or sign legal documents. °? Take care of children on your own. °· Rest. °Eating and drinking °· Follow the diet recommended by your health care provider. °· If you vomit: °? Drink water, juice, or soup when you can drink without vomiting. °? Make sure you have little or no nausea before eating solid foods. °General instructions °· Have a responsible adult stay with you until you are awake and alert. °· Take over-the-counter and prescription medicines only as told by your health care provider. °· If you smoke, do not smoke without supervision. °· Keep all follow-up visits as told by your health care provider. This is important. °Contact a health care provider if: °· You keep feeling nauseous or you keep vomiting. °· You feel light-headed. °· You develop a rash. °· You have a fever. °Get help right away if: °· You have trouble breathing. °This information is not intended to replace advice given to you by your health care provider. Make sure you discuss any questions you have with your health care provider. °Document Released: 03/30/2013 Document Revised: 11/12/2015 Document Reviewed: 09/29/2015 °Elsevier Interactive Patient Education © 2018 [employer] Inc. ° °

## 2017-12-15 NOTE — H&P (Signed)
Referring Physician(s): Wyatt Portela  Supervising Physician: Arne Cleveland  Patient Status:  [facility]  Chief Complaint:  "I'm getting my port out"   Subjective: Patient familiar to IR service from prior mesenteric mass biopsy and Port-A-Cath placement in 2011.  She has a history of follicular lymphoma and is status post chemotherapy.  She has been under observation for the past 8 years.  She presents today for Port-A-Cath removal.  She currently denies fever, headache, chest pain, dyspnea, cough, abdominal/back pain, nausea, vomiting or bleeding.  Past Medical History:  Diagnosis Date  . Anemia    iron def  . Helicobacter pylori ab+   . Hemorrhoids   . Hypertension   . nhl dx'd 12/2009   chemo comp 2011  . 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 Rhinitis, allergic   . Ulcer    peptic   Past Surgical History:  Procedure Laterality Date  . ABDOMINAL HYSTERECTOMY    . BREAST REDUCTION SURGERY    . PARTIAL COLECTOMY     dr blackmon  . TUBAL LIGATION        Allergies: Patient has no known allergies.  Medications: Prior to Admission medications   Medication Sig Start Date End Date Taking? Authorizing Provider  amLODipine (NORVASC) 5 MG tablet Take 1 tablet (5 mg total) by mouth daily. 06/24/17  Yes Plotnikov, Evie Lacks, MD  Cholecalciferol (VITAMIN D3) 1000 UNITS tablet Take 1,000 Units by mouth daily.     Yes [provider]  Cyanocobalamin (VITAMIN B 12 PO) Take 1,000 mcg by mouth daily.   Yes [provider]  ferrous sulfate (FERRO-BOB) 325 (65 FE) MG tablet Take 325 mg by mouth daily with breakfast.     Yes [provider]  levothyroxine (SYNTHROID, LEVOTHROID) 100 MCG tablet take 1 tablet by mouth once daily. 10/21/17  Yes Plotnikov, Evie Lacks, MD  lidocaine-prilocaine (EMLA) cream apply topically if needed APPLY TO PORT-A-CATH 1 TO 2 HOURS PRIOR TO APPT 07/18/16  Yes Wyatt Portela, MD  losartan (COZAAR) 100 MG tablet take 1 tablet by mouth once daily 05/10/17  Yes Plotnikov, Evie Lacks, MD  meclizine (ANTIVERT) 12.5 MG tablet Take 1-2 tablets (12.5-25 mg total) by mouth 3 (three) times daily as needed for dizziness or nausea. 10/21/17 10/21/18 Yes Plotnikov, Evie Lacks, MD  Aspirin-Salicylamide-Caffeine (BC HEADACHE PO) Take 1 packet by mouth daily as needed (headache).    [provider]     Vital Signs: Blood pressure 163/99, temperature 98.1, heart rate 61, respirations 16, O2 sat 98% room air   Physical Exam awake, alert.  Chest clear to auscultation bilaterally.  Clean, intact right chest wall Port-A-Cath.  Heart with regular rate and rhythm.  Abdomen obese, soft, positive bowel sounds, nontender.  No lower extremity edema.  Imaging: No results found.  Labs:  CBC: Recent Labs    05/20/17 0952 06/24/17 1043 11/18/17 0850 12/15/17 0753  WBC 4.7 5.8 5.0 5.2  HGB 12.0 12.6 12.1 13.2  HCT 36.2 37.8 37.1 39.4  PLT 239 259.0 221 228    COAGS: Recent Labs    12/15/17 0753  INR 0.94    BMP: Recent Labs    05/20/17 0952 06/24/17 1043 10/21/17 0827 11/18/17 0850  NA 141 142 142 142  K 4.3 4.1 4.2 4.1  CL  --  106 106 109  CO2 23 28 27 23   GLUCOSE 102 80 81 91  BUN 21.7 19 22 24   CALCIUM 9.7 9.7 9.3 9.2  CREATININE 1.7* 1.61* 1.80* 1.83*  GFRNONAA  --   --   --  27*  GFRAA  --   --   --  31*    LIVER FUNCTION TESTS: Recent Labs    05/20/17 0952 06/24/17 1043 11/18/17 0850  BILITOT 0.65 0.6 0.5  AST 19 17 19   ALT 16 15 19   ALKPHOS 87 89 87  PROT 6.7 6.7 6.7  ALBUMIN 3.7 3.9 3.8    Assessment and Plan: Patient with history of follicular lymphoma, status post chemotherapy in 2011.  She is currently under observation status and presents again today for Port-A-Cath removal.  Details/risks of procedure, including but not limited to, internal bleeding, infection, injury to adjacent structures discussed with patient and  family with their understanding and consent.   Electronically Signed: D. Rowe Robert, PA-C 12/15/2017, 8:12 AM   I spent a total of 20 minutes at the the patient's bedside AND on the patient's hospital floor or unit, greater than 50% of which was counseling/coordinating care for Port-A-Cath removal

## 2017-12-15 NOTE — Procedures (Signed)
  Procedure: R IJ port removal   EBL:   minimal Complications:  none immediate  See full dictation in [facility].  Dillard Cannon MD Main # [phone] Pager  [phone]

## 2017-12-21 ENCOUNTER — Encounter: Payer: Self-pay | Admitting: Internal Medicine

## 2017-12-21 ENCOUNTER — Ambulatory Visit: Payer: Medicare Other | Admitting: Internal Medicine

## 2017-12-21 VITALS — BP 120/78 | HR 78 | Ht 66.0 in | Wt 220.0 lb

## 2017-12-21 DIAGNOSIS — E538 Deficiency of other specified B group vitamins: Secondary | ICD-10-CM

## 2017-12-21 DIAGNOSIS — I10 Essential (primary) hypertension: Secondary | ICD-10-CM | POA: Diagnosis not present

## 2017-12-21 DIAGNOSIS — E039 Hypothyroidism, unspecified: Secondary | ICD-10-CM

## 2017-12-21 DIAGNOSIS — C8233 Follicular lymphoma grade IIIa, intra-abdominal lymph nodes: Secondary | ICD-10-CM | POA: Diagnosis not present

## 2017-12-21 DIAGNOSIS — Z0000 Encounter for general adult medical examination without abnormal findings: Secondary | ICD-10-CM | POA: Diagnosis not present

## 2017-12-21 DIAGNOSIS — N189 Chronic kidney disease, unspecified: Secondary | ICD-10-CM

## 2017-12-21 NOTE — Assessment & Plan Note (Signed)
[place]

## 2017-12-21 NOTE — Assessment & Plan Note (Addendum)
Wt Readings from Last 3 Encounters:  12/21/17 220 lb (99.8 kg)  12/15/17 218 lb (98.9 kg)  10/21/17 223 lb (101.2 kg)   Class 2 Appt w/Dr Leafy Ro offered

## 2017-12-21 NOTE — Assessment & Plan Note (Signed)
Amlodipine, Losartan 

## 2017-12-21 NOTE — Assessment & Plan Note (Signed)
Port is out 6/19

## 2017-12-21 NOTE — Patient Instructions (Signed)
Mediterranean Diet A Mediterranean diet refers to food and lifestyle choices that are based on the traditions of countries located on the [place]. This way of eating has been shown to help prevent certain conditions and improve outcomes for people who have chronic diseases, like kidney disease and heart disease. What are tips for following this plan? Lifestyle  Cook and eat meals together with your family, when possible.  Drink enough fluid to keep your urine clear or pale yellow.  Be physically active every day. This includes: ? Aerobic exercise like running or swimming. ? Leisure activities like gardening, walking, or housework.  Get 7-8 hours of sleep each night.  If recommended by your health care provider, drink red wine in moderation. This means 1 glass a day for nonpregnant women and 2 glasses a day for men. A glass of wine equals 5 oz (150 mL). Reading food labels  Check the serving size of packaged foods. For foods such as rice and pasta, the serving size refers to the amount of cooked product, not dry.  Check the total fat in packaged foods. Avoid foods that have saturated fat or trans fats.  Check the ingredients list for added sugars, such as corn syrup. Shopping  At the grocery store, buy most of your food from the areas near the walls of the store. This includes: ? Fresh fruits and vegetables (produce). ? Grains, beans, nuts, and seeds. Some of these may be available in unpackaged forms or large amounts (in bulk). ? Fresh seafood. ? Poultry and eggs. ? Low-fat dairy products.  Buy whole ingredients instead of prepackaged foods.  Buy fresh fruits and vegetables in-season from local farmers markets.  Buy frozen fruits and vegetables in resealable bags.  If you do not have access to quality fresh seafood, buy precooked frozen shrimp or canned fish, such as tuna, salmon, or sardines.  Buy small amounts of raw or cooked vegetables, salads, or olives from the  deli or salad bar at your store.  Stock your pantry so you always have certain foods on hand, such as olive oil, canned tuna, canned tomatoes, rice, pasta, and beans. Cooking  Cook foods with extra-virgin olive oil instead of using butter or other vegetable oils.  Have meat as a side dish, and have vegetables or grains as your main dish. This means having meat in small portions or adding small amounts of meat to foods like pasta or stew.  Use beans or vegetables instead of meat in common dishes like chili or lasagna.  Experiment with different cooking methods. Try roasting or broiling vegetables instead of steaming or sauteing them.  Add frozen vegetables to soups, stews, pasta, or rice.  Add nuts or seeds for added healthy fat at each meal. You can add these to yogurt, salads, or vegetable dishes.  Marinate fish or vegetables using olive oil, lemon juice, garlic, and fresh herbs. Meal planning  Plan to eat 1 vegetarian meal one day each week. Try to work up to 2 vegetarian meals, if possible.  Eat seafood 2 or more times a week.  Have healthy snacks readily available, such as: ? Vegetable sticks with hummus. ? Greek yogurt. ? Fruit and nut trail mix.  Eat balanced meals throughout the week. This includes: ? Fruit: 2-3 servings a day ? Vegetables: 4-5 servings a day ? Low-fat dairy: 2 servings a day ? Fish, poultry, or lean meat: 1 serving a day ? Beans and legumes: 2 or more servings a week ? Nuts   and seeds: 1-2 servings a day ? Whole grains: 6-8 servings a day ? Extra-virgin olive oil: 3-4 servings a day  Limit red meat and sweets to only a few servings a month What are my food choices?  Mediterranean diet ? Recommended ? Grains: Whole-grain pasta. Brown rice. Bulgar wheat. Polenta. Couscous. Whole-wheat bread. Oatmeal. Quinoa. ? Vegetables: Artichokes. Beets. Broccoli. Cabbage. Carrots. Eggplant. Green beans. Chard. Kale. Spinach. Onions. Leeks. Peas. Squash.  Tomatoes. Peppers. Radishes. ? Fruits: Apples. Apricots. Avocado. Berries. Bananas. Cherries. Dates. Figs. Grapes. Lemons. Melon. Oranges. Peaches. Plums. Pomegranate. ? Meats and other protein foods: Beans. Almonds. Sunflower seeds. Pine nuts. Peanuts. Cod. Salmon. Scallops. Shrimp. Tuna. Tilapia. Clams. Oysters. Eggs. ? Dairy: Low-fat milk. Cheese. Greek yogurt. ? Beverages: Water. Red wine. Herbal tea. ? Fats and oils: Extra virgin olive oil. Avocado oil. Grape seed oil. ? Sweets and desserts: Greek yogurt with honey. Baked apples. Poached pears. Trail mix. ? Seasoning and other foods: Basil. Cilantro. Coriander. Cumin. Mint. Parsley. Sage. Rosemary. Tarragon. Garlic. Oregano. Thyme. Pepper. Balsalmic vinegar. Tahini. Hummus. Tomato sauce. Olives. Mushrooms. ? Limit these ? Grains: Prepackaged pasta or rice dishes. Prepackaged cereal with added sugar. ? Vegetables: Deep fried potatoes (french fries). ? Fruits: Fruit canned in syrup. ? Meats and other protein foods: Beef. Pork. Lamb. Poultry with skin. Hot dogs. Bacon. ? Dairy: Ice cream. Sour cream. Whole milk. ? Beverages: Juice. Sugar-sweetened soft drinks. Beer. Liquor and spirits. ? Fats and oils: Butter. Canola oil. Vegetable oil. Beef fat (tallow). Lard. ? Sweets and desserts: Cookies. Cakes. Pies. Candy. ? Seasoning and other foods: Mayonnaise. Premade sauces and marinades. ? The items listed may not be a complete list. Talk with your dietitian about what dietary choices are right for you. Summary  The Mediterranean diet includes both food and lifestyle choices.  Eat a variety of fresh fruits and vegetables, beans, nuts, seeds, and whole grains.  Limit the amount of red meat and sweets that you eat.  Talk with your health care provider about whether it is safe for you to drink red wine in moderation. This means 1 glass a day for nonpregnant women and 2 glasses a day for men. A glass of wine equals 5 oz (150 mL). This information  is not intended to replace advice given to you by your health care provider. Make sure you discuss any questions you have with your health care provider. Document Released: 01/31/2016 Document Revised: 03/04/2016 Document Reviewed: 01/31/2016 Elsevier Interactive Patient Education  2018 [employer] Inc.  

## 2017-12-21 NOTE — Progress Notes (Signed)
Subjective:  Patient ID: Christine Morales, female    DOB: September 09, 1945  Age: 72 y.o. MRN: [id number]  CC: No chief complaint on file.   HPI [facility] presents for hypothyroidism, HTN, anemia f/u  Outpatient Medications Prior to Visit  Medication Sig Dispense Refill  . amLODipine (NORVASC) 5 MG tablet Take 1 tablet (5 mg total) by mouth daily. 90 tablet 3  . Aspirin-Salicylamide-Caffeine (BC HEADACHE PO) Take 1 packet by mouth daily as needed (headache).    . Cholecalciferol (VITAMIN D3) 1000 UNITS tablet Take 1,000 Units by mouth daily.      . Cyanocobalamin (VITAMIN B 12 PO) Take 1,000 mcg by mouth daily.    . ferrous sulfate (FERRO-BOB) 325 (65 FE) MG tablet Take 325 mg by mouth daily with breakfast.      . levothyroxine (SYNTHROID, LEVOTHROID) 100 MCG tablet take 1 tablet by mouth once daily. 90 tablet 3  . lidocaine-prilocaine (EMLA) cream apply topically if needed APPLY TO PORT-A-CATH 1 TO 2 HOURS PRIOR TO APPT 30 g 1  . losartan (COZAAR) 100 MG tablet take 1 tablet by mouth once daily 90 tablet 3  . meclizine (ANTIVERT) 12.5 MG tablet Take 1-2 tablets (12.5-25 mg total) by mouth 3 (three) times daily as needed for dizziness or nausea. 60 tablet 1   Facility-Administered Medications Prior to Visit  Medication Dose Route Frequency Provider Last Rate Last Dose  . heparin lock flush 100 unit/mL  500 Units Intravenous Once Shadad, Firas N, MD      . sodium chloride 0.9 % injection 10 mL  10 mL Intravenous PRN Wyatt Portela, MD      . sodium chloride 0.9 % injection 10 mL  10 mL Intravenous PRN Wyatt Portela, MD   10 mL at 05/20/17 0956    ROS: Review of Systems  Constitutional: Negative for activity change, appetite change, chills, fatigue and unexpected weight change.  HENT: Negative for congestion, mouth sores and sinus pressure.   Eyes: Negative for visual disturbance.  Respiratory: Negative for cough and chest tightness.   Gastrointestinal: Negative for abdominal pain and  nausea.  Genitourinary: Negative for difficulty urinating, frequency and vaginal pain.  Musculoskeletal: Negative for back pain and gait problem.  Skin: Negative for pallor and rash.  Neurological: Negative for dizziness, tremors, weakness, numbness and headaches.  Psychiatric/Behavioral: Negative for confusion and sleep disturbance.    Objective:  BP 120/78 (BP Location: Left Arm, Patient Position: Sitting, Cuff Size: Normal)   Pulse 78   Ht 5\' 6"  (1.676 m)   Wt 220 lb (99.8 kg)   SpO2 96%   BMI 35.51 kg/m   BP Readings from Last 3 Encounters:  12/21/17 120/78  12/15/17 (!) 163/99  12/15/17 (!) 154/77    Wt Readings from Last 3 Encounters:  12/21/17 220 lb (99.8 kg)  12/15/17 218 lb (98.9 kg)  10/21/17 223 lb (101.2 kg)    Physical Exam  Constitutional: She appears well-developed. No distress.  HENT:  Head: Normocephalic.  Right Ear: External ear normal.  Left Ear: External ear normal.  Nose: Nose normal.  Mouth/Throat: Oropharynx is clear and moist.  Eyes: Pupils are equal, round, and reactive to light. Conjunctivae are normal. Right eye exhibits no discharge. Left eye exhibits no discharge.  Neck: Normal range of motion. Neck supple. No JVD present. No tracheal deviation present. No thyromegaly present.  Cardiovascular: Normal rate, regular rhythm and normal heart sounds.  Pulmonary/Chest: No stridor. No respiratory distress. She has  no wheezes.  Abdominal: Soft. Bowel sounds are normal. She exhibits no distension and no mass. There is no tenderness. There is no rebound and no guarding.  Musculoskeletal: She exhibits no edema or tenderness.  Lymphadenopathy:    She has no cervical adenopathy.  Neurological: She displays normal reflexes. No cranial nerve deficit. She exhibits normal muscle tone. Coordination normal.  Skin: No rash noted. No erythema.  Psychiatric: She has a normal mood and affect. Her behavior is normal. Judgment and thought content normal.   obese  Lab Results  Component Value Date   WBC 5.2 12/15/2017   HGB 13.2 12/15/2017   HCT 39.4 12/15/2017   PLT 228 12/15/2017   GLUCOSE 100 (H) 12/15/2017   CHOL 168 06/24/2017   TRIG 136.0 06/24/2017   HDL 44.20 06/24/2017   LDLCALC 96 06/24/2017   ALT 19 11/18/2017   AST 19 11/18/2017   NA 145 12/15/2017   K 4.7 12/15/2017   CL 112 (H) 12/15/2017   CREATININE 1.65 (H) 12/15/2017   BUN 19 12/15/2017   CO2 27 12/15/2017   TSH 0.09 (L) 10/21/2017   INR 0.94 12/15/2017    Ir Removal Tun Access W/ Port W/o Fl  Result Date: 12/15/2017 CLINICAL DATA:  History of follicular lymphoma. Port catheter placed 01/21/2010 by Dr. Annamaria Boots, worked well without complication, no longer needed. Removal requested. EXAM: EXAM TUNNELED PORT CATHETER REMOVAL TECHNIQUE: The procedure, risks (including but not limited to bleeding, infection, organ damage ), benefits, and alternatives were explained to the patient. Questions regarding the procedure were encouraged and answered. The patient understands and consents to the procedure. Intravenous Fentanyl and Versed were administered as conscious sedation during continuous monitoring of the patient's level of consciousness and physiological / cardiorespiratory status by the radiology RN, with a total moderate sedation time of 18 minutes. Overlying skin prepped with chlorhexidine, draped in usual sterile fashion, infiltrated locally with 1% lidocaine. A small incision was made over the scar from previous placement. The port catheter was dissected free from the underlying soft tissues and removed intact. Hemostasis was achieved. The port pocket was closed with deep interrupted and subcuticular continuous 3-0 Monocryl sutures, then covered with Dermabond. The patient tolerated the procedure well. COMPLICATIONS: COMPLICATIONS None immediate IMPRESSION: 1.  Technically successful tunneled Port catheter removal. Electronically Signed   By: Lucrezia Europe M.D.   On: 12/15/2017  10:08    Assessment & Plan:   There are no diagnoses linked to this encounter.   No orders of the defined types were placed in this encounter.    Follow-up: No follow-ups on file.  Walker Kehr, MD

## 2017-12-21 NOTE — Assessment & Plan Note (Signed)
F/u w/Dr Webb 

## 2017-12-21 NOTE — Assessment & Plan Note (Signed)
B12 po

## 2018-01-29 ENCOUNTER — Telehealth: Payer: Self-pay | Admitting: Internal Medicine

## 2018-01-29 DIAGNOSIS — Z1211 Encounter for screening for malignant neoplasm of colon: Secondary | ICD-10-CM

## 2018-01-29 NOTE — Telephone Encounter (Unsigned)
Copied from [place] [id number]. Topic: Quick Communication - See Telephone Encounter >> Jan 29, 2018  1:50 PM Percell Belt A wrote: CRM for notification. See Telephone encounter for: 01/29/18.     Pt called in and would like a order put in the system for her Routine COLONOSCOPY.  She was due back in 2018

## 2018-01-30 NOTE — Telephone Encounter (Signed)
Ok Thx 

## 2018-03-19 ENCOUNTER — Encounter: Payer: Self-pay | Admitting: Internal Medicine

## 2018-03-31 LAB — HM MAMMOGRAPHY

## 2018-05-04 ENCOUNTER — Encounter

## 2018-05-04 ENCOUNTER — Ambulatory Visit: Payer: Medicare Other | Admitting: Podiatry

## 2018-05-05 ENCOUNTER — Other Ambulatory Visit: Payer: Self-pay | Admitting: Internal Medicine

## 2018-05-14 ENCOUNTER — Inpatient Hospital Stay (HOSPITAL_BASED_OUTPATIENT_CLINIC_OR_DEPARTMENT_OTHER): Payer: Medicare Other | Admitting: Oncology

## 2018-05-14 ENCOUNTER — Inpatient Hospital Stay: Payer: Medicare Other | Attending: Oncology

## 2018-05-14 ENCOUNTER — Telehealth: Payer: Self-pay

## 2018-05-14 VITALS — BP 128/76 | HR 64 | Temp 97.6°F | Resp 17 | Ht 66.0 in | Wt 223.9 lb

## 2018-05-14 DIAGNOSIS — C8213 Follicular lymphoma grade II, intra-abdominal lymph nodes: Secondary | ICD-10-CM

## 2018-05-14 DIAGNOSIS — Z8572 Personal history of non-Hodgkin lymphomas: Secondary | ICD-10-CM | POA: Diagnosis present

## 2018-05-14 LAB — CMP (CANCER CENTER ONLY)
ALT: 19 U/L (ref 0–44)
AST: 25 U/L (ref 15–41)
Albumin: 3.9 g/dL (ref 3.5–5.0)
Alkaline Phosphatase: 97 U/L (ref 38–126)
Anion gap: 8 (ref 5–15)
BUN: 20 mg/dL (ref 8–23)
CHLORIDE: 107 mmol/L (ref 98–111)
CO2: 27 mmol/L (ref 22–32)
CREATININE: 1.77 mg/dL — AB (ref 0.44–1.00)
Calcium: 10 mg/dL (ref 8.9–10.3)
GFR, EST AFRICAN AMERICAN: 32 mL/min — AB (ref >60)
GFR, Estimated: 28 mL/min — ABNORMAL LOW (ref >60)
Glucose, Bld: 87 mg/dL (ref 70–99)
Potassium: 4.9 mmol/L (ref 3.5–5.1)
SODIUM: 142 mmol/L (ref 135–145)
Total Bilirubin: 0.7 mg/dL (ref 0.3–1.2)
Total Protein: 7.1 g/dL (ref 6.5–8.1)

## 2018-05-14 LAB — CBC WITH DIFFERENTIAL (CANCER CENTER ONLY)
ABS IMMATURE GRANULOCYTES: 0.02 10*3/uL (ref 0.00–0.07)
BASOS ABS: 0 10*3/uL (ref 0.0–0.1)
Basophils Relative: 1 %
EOS PCT: 2 %
Eosinophils Absolute: 0.1 10*3/uL (ref 0.0–0.5)
HCT: 39.4 % (ref 36.0–46.0)
Hemoglobin: 13 g/dL (ref 12.0–15.0)
IMMATURE GRANULOCYTES: 0 %
LYMPHS ABS: 2.8 10*3/uL (ref 0.7–4.0)
LYMPHS PCT: 47 %
MCH: 28.5 pg (ref 26.0–34.0)
MCHC: 33 g/dL (ref 30.0–36.0)
MCV: 86.4 fL (ref 80.0–100.0)
Monocytes Absolute: 0.4 10*3/uL (ref 0.1–1.0)
Monocytes Relative: 8 %
NEUTROS ABS: 2.4 10*3/uL (ref 1.7–7.7)
NEUTROS PCT: 42 %
NRBC: 0 % (ref 0.0–0.2)
PLATELETS: 244 10*3/uL (ref 150–400)
RBC: 4.56 MIL/uL (ref 3.87–5.11)
RDW: 14.1 % (ref 11.5–15.5)
WBC Count: 5.8 10*3/uL (ref 4.0–10.5)

## 2018-05-14 NOTE — Telephone Encounter (Signed)
Printed avs and calender of upcoming appointment. Per 11/22 los

## 2018-05-14 NOTE — Progress Notes (Signed)
Hematology and Oncology Follow Up Visit  Christine Morales [id number] 21-Mar-1946 72 y.o. 05/14/2018 9:37 AM  Principle Diagnosis: 72 year old woman with follicular lymphoma diagnosed in 2011.  She presented with stage IIA disease and abdominal adenopathy. Prior Therapy:   She is S/P 5 cycles of bendamustine and rituximab, finished in December 2011. She had a complete response at that time.  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  Current therapy: Active surveillance.   Interim History:  Christine Morales returns today for a repeat evaluation.  Since last visit, she reports no major complaints.  She denies any palpable adenopathy or changes in her performance status.  She denies any constitutional symptoms or recent hospitalizations.  She continues to enjoy excellent performance status and quality of life and has not had any issues to report.  Port-A-Cath was removed without any complications.  She does not report any headaches, blurry vision, syncope or seizures.  He denies any alteration mental status or confusion.  Does not report any fevers, chills or sweats.  Does not report any cough, wheezing or hemoptysis.  Does not report any chest pain, palpitation, orthopnea or leg edema.  Does not report any nausea, vomiting or abdominal pain.  Denies any changes in bowel habits.  Does not report frequency, urgency or hematuria.  Denies any bleeding or clotting tendency.  Denies any heat or cold intolerance.  Does not report any skin rashes or lesions.  Remaining review of systems is negative.    Medications: I have reviewed the patient's current medications.  Unchanged. Current Outpatient Prescriptions  Medication Sig Dispense Refill  . amLODipine (NORVASC) 5 MG tablet take 1 tablet by mouth once daily 90 tablet 1  . Aspirin-Salicylamide-Caffeine (BC HEADACHE PO) Take 1 packet  by mouth daily as needed (headache).    . Cholecalciferol (VITAMIN D3) 1000 UNITS tablet Take 1,000 Units by mouth daily.      . Cyanocobalamin (VITAMIN B 12 PO) Take 1,000 mcg by mouth daily.    . ferrous sulfate (FERRO-BOB) 325 (65 FE) MG tablet Take 325 mg by mouth daily with breakfast.      . levothyroxine (SYNTHROID, LEVOTHROID) 100 MCG tablet take 1 tablet by mouth once daily   30 tablet 11  . lidocaine-prilocaine (EMLA) cream Apply 1 application topically as needed. Apply to port-a-cath site 1 to 2 hours prior to appt. 30 g 1  . losartan (COZAAR) 100 MG tablet take 1 tablet by mouth once daily 90 tablet 1  . meloxicam (MOBIC) 15 MG tablet Take 1 tablet (15 mg total) by mouth daily. 30 tablet 3  . NEOMYCIN-POLYMYXIN-HYDROCORTISONE (CORTISPORIN) 1 % SOLN otic solution Apply 1-2 drops to toe BID after soaking 10 mL 1   No current facility-administered medications for this visit.    Facility-Administered Medications Ordered in Other Visits  Medication Dose Route Frequency Provider Last Rate Last Dose  . heparin lock flush 100 unit/mL  500 Units Intravenous Once Wyatt Portela, MD      . sodium chloride 0.9 % injection 10 mL  10 mL Intravenous PRN Wyatt Portela, MD        Allergies: No Known Allergies  Physical Exam: Blood pressure 128/76, pulse 64, temperature 97.6 F (36.4 C), temperature source Oral, resp. rate 17, height 5\' 6"  (1.676 m), weight 223 lb 14.4 oz (101.6 kg), SpO2 100 %.    ECOG: 1  General appearance: Comfortable appearing without any discomfort Head: Normocephalic without any trauma Oropharynx: Mucous membranes are moist and pink without any thrush or ulcers. Eyes: Pupils are equal and round reactive to light. Lymph nodes: No cervical, supraclavicular, inguinal or axillary lymphadenopathy.   Heart:regular rate and rhythm.  S1 and S2 without leg edema. Lung: Clear without any rhonchi or wheezes.  No dullness to percussion. Abdomin: Soft, nontender,  nondistended with good bowel sounds.  No hepatosplenomegaly. Musculoskeletal: No joint deformity or effusion.  Full range of motion noted. Neurological: No deficits noted on motor, sensory and deep tendon reflex exam. Skin: No petechial rash or dryness.  Appeared moist.    Lab Results: Lab Results  Component Value Date   WBC 5.2 12/15/2017   HGB 13.2 12/15/2017   HCT 39.4 12/15/2017   MCV 87.0 12/15/2017   PLT 228 12/15/2017      Impression and Plan:  72 year old woman with the following:  1.  Follicular lymphoma diagnosed in 2011 after presenting with stage IIa disease.  She continues to be disease-free at this time without any evidence of relapsed based on labs and clinical evaluation.  The natural course of this disease and risk of relapse was discussed again today.  Laboratory data and physical exam today do not suggest any disease relapse.  The plan is to continue with active surveillance and repeat imaging studies she develops any symptoms.  Options to treat her disease upon relapse were reviewed today which include restarting systemic chemotherapy, oral targeted therapy among others.  2.  IV access: Port-A-Cath was removed without any complications.  3.  Chronic renal insufficiency: Kidney function remains stable at this time.  4.  Follow-up: We will be in 6 months.  15  minutes was spent with the patient face-to-face today.  More than 50% of time was dedicated to updating her disease status, risk of relapse and discussing treatment options in case of relapse.    Christine Button, MD 11/22/20199:37 AM

## 2018-05-17 ENCOUNTER — Encounter: Payer: Medicare Other | Admitting: Internal Medicine

## 2018-05-19 ENCOUNTER — Other Ambulatory Visit: Payer: Self-pay | Admitting: Internal Medicine

## 2018-06-08 ENCOUNTER — Encounter: Payer: Self-pay | Admitting: Podiatry

## 2018-06-08 ENCOUNTER — Ambulatory Visit: Payer: Medicare Other | Admitting: Podiatry

## 2018-06-08 DIAGNOSIS — Q828 Other specified congenital malformations of skin: Secondary | ICD-10-CM

## 2018-06-08 DIAGNOSIS — M7751 Other enthesopathy of right foot: Secondary | ICD-10-CM | POA: Diagnosis not present

## 2018-06-08 NOTE — Progress Notes (Signed)
She presents today chief complaint of a painful callus to the plantar aspect fifth metatarsal of the right foot.  States that I had the spot worked on before several months ago and is come back.  Objective: Vital signs are stable alert and oriented x3.  Sub-fifth metatarsal does demonstrate an area of fluctuance as well as a reactive hyperkeratotic lesion.  Assessment: Capsulitis bursitis sub-fifth metatarsal head of the right foot with reactive hyperkeratotic lesion.  Plan: Discussed etiology pathology and surgical therapy at this point I injected the bursa with 2 mg of dexamethasone and local anesthetic.  Tolerated procedure well without complications.  I also debrided the reactive hyperkeratotic lesion we will follow-up with her on an as-needed basis.  We did discuss wider and longer shoes.

## 2018-06-18 ENCOUNTER — Other Ambulatory Visit: Payer: Self-pay | Admitting: Internal Medicine

## 2018-06-24 ENCOUNTER — Ambulatory Visit: Payer: Medicare Other | Admitting: Internal Medicine

## 2018-06-24 ENCOUNTER — Encounter: Payer: Self-pay | Admitting: Internal Medicine

## 2018-06-24 ENCOUNTER — Other Ambulatory Visit (INDEPENDENT_AMBULATORY_CARE_PROVIDER_SITE_OTHER): Payer: Medicare Other

## 2018-06-24 VITALS — BP 124/76 | HR 65 | Temp 97.5°F | Ht 66.0 in | Wt 223.0 lb

## 2018-06-24 DIAGNOSIS — I10 Essential (primary) hypertension: Secondary | ICD-10-CM

## 2018-06-24 DIAGNOSIS — C8233 Follicular lymphoma grade IIIa, intra-abdominal lymph nodes: Secondary | ICD-10-CM | POA: Diagnosis not present

## 2018-06-24 DIAGNOSIS — E538 Deficiency of other specified B group vitamins: Secondary | ICD-10-CM

## 2018-06-24 DIAGNOSIS — D509 Iron deficiency anemia, unspecified: Secondary | ICD-10-CM | POA: Diagnosis not present

## 2018-06-24 DIAGNOSIS — E559 Vitamin D deficiency, unspecified: Secondary | ICD-10-CM | POA: Diagnosis not present

## 2018-06-24 DIAGNOSIS — E039 Hypothyroidism, unspecified: Secondary | ICD-10-CM

## 2018-06-24 LAB — URINALYSIS, ROUTINE W REFLEX MICROSCOPIC
Bilirubin Urine: NEGATIVE
Hgb urine dipstick: NEGATIVE
Ketones, ur: NEGATIVE
NITRITE: NEGATIVE
Specific Gravity, Urine: 1.01 (ref 1.000–1.030)
Total Protein, Urine: NEGATIVE
Urine Glucose: NEGATIVE
Urobilinogen, UA: 0.2 (ref 0.0–1.0)
pH: 6 (ref 5.0–8.0)

## 2018-06-24 LAB — LIPID PANEL
Cholesterol: 185 mg/dL (ref 0–200)
HDL: 49.1 mg/dL (ref >39.00)
LDL CALC: 110 mg/dL — AB (ref 0–99)
NonHDL: 136.24
Total CHOL/HDL Ratio: 4
Triglycerides: 132 mg/dL (ref 0.0–149.0)
VLDL: 26.4 mg/dL (ref 0.0–40.0)

## 2018-06-24 LAB — BASIC METABOLIC PANEL
BUN: 23 mg/dL (ref 6–23)
CO2: 28 mEq/L (ref 19–32)
Calcium: 9.5 mg/dL (ref 8.4–10.5)
Chloride: 105 mEq/L (ref 96–112)
Creatinine, Ser: 1.71 mg/dL — ABNORMAL HIGH (ref 0.40–1.20)
GFR: 37.92 mL/min — ABNORMAL LOW (ref >60.00)
Glucose, Bld: 83 mg/dL (ref 70–99)
Potassium: 4.8 mEq/L (ref 3.5–5.1)
Sodium: 140 mEq/L (ref 135–145)

## 2018-06-24 LAB — VITAMIN D 25 HYDROXY (VIT D DEFICIENCY, FRACTURES): VITD: 52.06 ng/mL (ref 30.00–100.00)

## 2018-06-24 LAB — HEPATIC FUNCTION PANEL
ALT: 13 U/L (ref 0–35)
AST: 16 U/L (ref 0–37)
Albumin: 4.1 g/dL (ref 3.5–5.2)
Alkaline Phosphatase: 86 U/L (ref 39–117)
BILIRUBIN TOTAL: 0.5 mg/dL (ref 0.2–1.2)
Bilirubin, Direct: 0.1 mg/dL (ref 0.0–0.3)
Total Protein: 6.9 g/dL (ref 6.0–8.3)

## 2018-06-24 LAB — CBC WITH DIFFERENTIAL/PLATELET
Basophils Absolute: 0 10*3/uL (ref 0.0–0.1)
Basophils Relative: 0.7 % (ref 0.0–3.0)
Eosinophils Absolute: 0.2 10*3/uL (ref 0.0–0.7)
Eosinophils Relative: 3.7 % (ref 0.0–5.0)
HEMATOCRIT: 39.1 % (ref 36.0–46.0)
Hemoglobin: 13.1 g/dL (ref 12.0–15.0)
LYMPHS PCT: 52.3 % — AB (ref 12.0–46.0)
Lymphs Abs: 3.2 10*3/uL (ref 0.7–4.0)
MCHC: 33.6 g/dL (ref 30.0–36.0)
MCV: 85.7 fl (ref 78.0–100.0)
Monocytes Absolute: 0.5 10*3/uL (ref 0.1–1.0)
Monocytes Relative: 7.9 % (ref 3.0–12.0)
Neutro Abs: 2.2 10*3/uL (ref 1.4–7.7)
Neutrophils Relative %: 35.4 % — ABNORMAL LOW (ref 43.0–77.0)
Platelets: 239 10*3/uL (ref 150.0–400.0)
RBC: 4.56 Mil/uL (ref 3.87–5.11)
RDW: 14.3 % (ref 11.5–15.5)
WBC: 6.2 10*3/uL (ref 4.0–10.5)

## 2018-06-24 LAB — TSH: TSH: 0.09 u[IU]/mL — ABNORMAL LOW (ref 0.35–4.50)

## 2018-06-24 LAB — VITAMIN B12: Vitamin B-12: >1500 pg/mL — ABNORMAL HIGH (ref 211–911)

## 2018-06-24 NOTE — Assessment & Plan Note (Signed)
Levothroid 

## 2018-06-24 NOTE — Progress Notes (Signed)
Subjective:  Patient ID: Christine Morales, female    DOB: [birth year]  Age: 73 y.o. MRN: [id number]  CC: No chief complaint on file.   HPI Christine Morales presents for HTN, Vit B12 and Vit d def f/u  Outpatient Medications Prior to Visit  Medication Sig Dispense Refill  . amLODipine (NORVASC) 5 MG tablet TAKE 1 TABLET BY MOUTH ONCE A DAY 90 tablet 0  . Aspirin-Salicylamide-Caffeine (BC HEADACHE PO) Take 1 packet by mouth daily as needed (headache).    . Cholecalciferol (VITAMIN D3) 1000 UNITS tablet Take 1,000 Units by mouth daily.      . Cyanocobalamin (VITAMIN B 12 PO) Take 1,000 mcg by mouth daily.    . ferrous sulfate (FERRO-BOB) 325 (65 FE) MG tablet Take 325 mg by mouth daily with breakfast.      . levothyroxine (SYNTHROID, LEVOTHROID) 100 MCG tablet take 1 tablet by mouth once daily. 90 tablet 3  . losartan (COZAAR) 100 MG tablet TAKE 1 TABLET BY MOUTH ONCE DAILY 90 tablet 0   Facility-Administered Medications Prior to Visit  Medication Dose Route Frequency Provider Last Rate Last Dose  . heparin lock flush 100 unit/mL  500 Units Intravenous Once Shadad, Firas N, MD      . sodium chloride 0.9 % injection 10 mL  10 mL Intravenous PRN Wyatt Portela, MD      . sodium chloride 0.9 % injection 10 mL  10 mL Intravenous PRN Wyatt Portela, MD   10 mL at 05/20/17 0956    ROS: Review of Systems  Constitutional: Negative for activity change, appetite change, chills, fatigue and unexpected weight change.  HENT: Negative for congestion, mouth sores and sinus pressure.   Eyes: Negative for visual disturbance.  Respiratory: Negative for cough and chest tightness.   Gastrointestinal: Negative for abdominal pain and nausea.  Genitourinary: Negative for difficulty urinating, frequency and vaginal pain.  Musculoskeletal: Negative for back pain and gait problem.  Skin: Negative for pallor and rash.  Neurological: Negative for dizziness, tremors, weakness, numbness and headaches.    Psychiatric/Behavioral: Negative for confusion, sleep disturbance and suicidal ideas.    Objective:  BP 124/76 (BP Location: Left Arm, Patient Position: Sitting, Cuff Size: Large)   Pulse 65   Temp (!) 97.5 F (36.4 C) (Oral)   Ht 5\' 6"  (1.676 m)   Wt 223 lb (101.2 kg)   SpO2 95%   BMI 35.99 kg/m   BP Readings from Last 3 Encounters:  06/24/18 124/76  05/14/18 128/76  12/21/17 120/78    Wt Readings from Last 3 Encounters:  06/24/18 223 lb (101.2 kg)  05/14/18 223 lb 14.4 oz (101.6 kg)  12/21/17 220 lb (99.8 kg)    Physical Exam Constitutional:      General: She is not in acute distress.    Appearance: She is well-developed.  HENT:     Head: Normocephalic.     Right Ear: External ear normal.     Left Ear: External ear normal.     Nose: Nose normal.  Eyes:     General:        Right eye: No discharge.        Left eye: No discharge.     Conjunctiva/sclera: Conjunctivae normal.     Pupils: Pupils are equal, round, and reactive to light.  Neck:     Musculoskeletal: Normal range of motion and neck supple.     Thyroid: No thyromegaly.     Vascular: No JVD.  Trachea: No tracheal deviation.  Cardiovascular:     Rate and Rhythm: Normal rate and regular rhythm.     Heart sounds: Normal heart sounds.  Pulmonary:     Effort: No respiratory distress.     Breath sounds: No stridor. No wheezing.  Abdominal:     General: Bowel sounds are normal. There is no distension.     Palpations: Abdomen is soft. There is no mass.     Tenderness: There is no abdominal tenderness. There is no guarding or rebound.  Musculoskeletal:        General: No tenderness.  Lymphadenopathy:     Cervical: No cervical adenopathy.  Skin:    Findings: No erythema or rash.  Neurological:     Cranial Nerves: No cranial nerve deficit.     Motor: No abnormal muscle tone.     Coordination: Coordination normal.     Deep Tendon Reflexes: Reflexes normal.  Psychiatric:        Behavior: Behavior  normal.        Thought Content: Thought content normal.        Judgment: Judgment normal.   obese  Lab Results  Component Value Date   WBC 5.8 05/14/2018   HGB 13.0 05/14/2018   HCT 39.4 05/14/2018   PLT 244 05/14/2018   GLUCOSE 87 05/14/2018   CHOL 168 06/24/2017   TRIG 136.0 06/24/2017   HDL 44.20 06/24/2017   LDLCALC 96 06/24/2017   ALT 19 05/14/2018   AST 25 05/14/2018   NA 142 05/14/2018   K 4.9 05/14/2018   CL 107 05/14/2018   CREATININE 1.77 (H) 05/14/2018   BUN 20 05/14/2018   CO2 27 05/14/2018   TSH 0.09 (L) 10/21/2017   INR 0.94 12/15/2017    Ir Removal Tun Access W/ Port W/o Fl  Result Date: 12/15/2017 CLINICAL DATA:  History of follicular lymphoma. Port catheter placed 01/21/2010 by Dr. Annamaria Boots, worked well without complication, no longer needed. Removal requested. EXAM: EXAM TUNNELED PORT CATHETER REMOVAL TECHNIQUE: The procedure, risks (including but not limited to bleeding, infection, organ damage ), benefits, and alternatives were explained to the patient. Questions regarding the procedure were encouraged and answered. The patient understands and consents to the procedure. Intravenous Fentanyl and Versed were administered as conscious sedation during continuous monitoring of the patient's level of consciousness and physiological / cardiorespiratory status by the radiology RN, with a total moderate sedation time of 18 minutes. Overlying skin prepped with chlorhexidine, draped in usual sterile fashion, infiltrated locally with 1% lidocaine. A small incision was made over the scar from previous placement. The port catheter was dissected free from the underlying soft tissues and removed intact. Hemostasis was achieved. The port pocket was closed with deep interrupted and subcuticular continuous 3-0 Monocryl sutures, then covered with Dermabond. The patient tolerated the procedure well. COMPLICATIONS: COMPLICATIONS None immediate IMPRESSION: 1.  Technically successful tunneled  Port catheter removal. Electronically Signed   By: Lucrezia Europe M.D.   On: 12/15/2017 10:08    Assessment & Plan:   There are no diagnoses linked to this encounter.   No orders of the defined types were placed in this encounter.    Follow-up: No follow-ups on file.  Walker Kehr, MD

## 2018-06-24 NOTE — Assessment & Plan Note (Signed)
Risks associated with treatment noncompliance were discussed. Compliance was encouraged. Using po B12

## 2018-06-24 NOTE — Assessment & Plan Note (Signed)
CBC

## 2018-06-24 NOTE — Assessment & Plan Note (Addendum)
F/u w/Dr Alen Blew Colon Dr Henrene Pastor - due in 2020

## 2018-06-24 NOTE — Assessment & Plan Note (Signed)
On Amlodipine, Losartan °

## 2018-08-22 ENCOUNTER — Other Ambulatory Visit: Payer: Self-pay | Admitting: Internal Medicine

## 2018-08-26 ENCOUNTER — Other Ambulatory Visit (INDEPENDENT_AMBULATORY_CARE_PROVIDER_SITE_OTHER): Payer: Medicare Other

## 2018-08-26 DIAGNOSIS — N189 Chronic kidney disease, unspecified: Secondary | ICD-10-CM

## 2018-08-26 DIAGNOSIS — E039 Hypothyroidism, unspecified: Secondary | ICD-10-CM | POA: Diagnosis not present

## 2018-08-26 LAB — BASIC METABOLIC PANEL WITH GFR
BUN: 23 mg/dL (ref 6–23)
CO2: 29 meq/L (ref 19–32)
Calcium: 9.3 mg/dL (ref 8.4–10.5)
Chloride: 104 meq/L (ref 96–112)
Creatinine, Ser: 1.82 mg/dL — ABNORMAL HIGH (ref 0.40–1.20)
GFR: 33.19 mL/min — ABNORMAL LOW (ref >60.00)
Glucose, Bld: 106 mg/dL — ABNORMAL HIGH (ref 70–99)
Potassium: 4.1 meq/L (ref 3.5–5.1)
Sodium: 139 meq/L (ref 135–145)

## 2018-08-26 LAB — TSH: TSH: 0.22 u[IU]/mL — ABNORMAL LOW (ref 0.35–4.50)

## 2018-08-26 LAB — T4, FREE: Free T4: 0.68 ng/dL (ref 0.60–1.60)

## 2018-08-27 ENCOUNTER — Other Ambulatory Visit: Payer: Self-pay | Admitting: Internal Medicine

## 2018-09-17 ENCOUNTER — Other Ambulatory Visit: Payer: Self-pay | Admitting: Internal Medicine

## 2018-10-14 ENCOUNTER — Telehealth: Payer: Self-pay | Admitting: Oncology

## 2018-10-14 NOTE — Telephone Encounter (Signed)
Changed 5/22 appt to phone visit per sch msg. Called patient. No answer. Voicemail not set up so couldn't leave msg. Will try and contact again.

## 2018-10-19 ENCOUNTER — Encounter: Payer: Medicare Other | Admitting: Women's Health

## 2018-11-12 ENCOUNTER — Other Ambulatory Visit: Payer: Medicare Other

## 2018-11-12 ENCOUNTER — Ambulatory Visit: Payer: Medicare Other | Admitting: Oncology

## 2018-12-15 ENCOUNTER — Other Ambulatory Visit: Payer: Self-pay | Admitting: Internal Medicine

## 2018-12-17 ENCOUNTER — Other Ambulatory Visit: Payer: Self-pay

## 2018-12-20 ENCOUNTER — Other Ambulatory Visit: Payer: Self-pay

## 2018-12-20 ENCOUNTER — Ambulatory Visit: Payer: Medicare Other | Admitting: Women's Health

## 2018-12-20 ENCOUNTER — Encounter: Payer: Self-pay | Admitting: Women's Health

## 2018-12-20 VITALS — BP 122/83 | Ht 67.0 in | Wt 229.2 lb

## 2018-12-20 DIAGNOSIS — Z01419 Encounter for gynecological examination (general) (routine) without abnormal findings: Secondary | ICD-10-CM | POA: Diagnosis not present

## 2018-12-20 NOTE — Patient Instructions (Signed)
Carbohydrate Counting for Diabetes Mellitus, Adult  Carbohydrate counting is a method of keeping track of how many carbohydrates you eat. Eating carbohydrates naturally increases the amount of sugar (glucose) in the blood. Counting how many carbohydrates you eat helps keep your blood glucose within normal limits, which helps you manage your diabetes (diabetes mellitus). It is important to know how many carbohydrates you can safely have in each meal. This is different for every person. A diet and nutrition specialist (registered dietitian) can help you make a meal plan and calculate how many carbohydrates you should have at each meal and snack. Carbohydrates are found in the following foods:  Grains, such as breads and cereals.  Dried beans and soy products.  Starchy vegetables, such as potatoes, peas, and corn.  Fruit and fruit juices.  Milk and yogurt.  Sweets and snack foods, such as cake, cookies, candy, chips, and soft drinks. How do I count carbohydrates? There are two ways to count carbohydrates in food. You can use either of the methods or a combination of both. Reading "Nutrition Facts" on packaged food The "Nutrition Facts" list is included on the labels of almost all packaged foods and beverages in the [place] It includes:  The serving size.  Information about nutrients in each serving, including the grams (g) of carbohydrate per serving. To use the "Nutrition Facts":  Decide how many servings you will have.  Multiply the number of servings by the number of carbohydrates per serving.  The resulting number is the total amount of carbohydrates that you will be having. Learning standard serving sizes of other foods When you eat carbohydrate foods that are not packaged or do not include "Nutrition Facts" on the label, you need to measure the servings in order to count the amount of carbohydrates:  Measure the foods that you will eat with a food scale or measuring cup, if needed.   Decide how many standard-size servings you will eat.  Multiply the number of servings by 15. Most carbohydrate-rich foods have about 15 g of carbohydrates per serving. ? For example, if you eat 8 oz (170 g) of strawberries, you will have eaten 2 servings and 30 g of carbohydrates (2 servings x 15 g = 30 g).  For foods that have more than one food mixed, such as soups and casseroles, you must count the carbohydrates in each food that is included. The following list contains standard serving sizes of common carbohydrate-rich foods. Each of these servings has about 15 g of carbohydrates:   hamburger bun or  English muffin.   oz (15 mL) syrup.   oz (14 g) jelly.  1 slice of bread.  1 six-inch tortilla.  3 oz (85 g) cooked rice or pasta.  4 oz (113 g) cooked dried beans.  4 oz (113 g) starchy vegetable, such as peas, corn, or potatoes.  4 oz (113 g) hot cereal.  4 oz (113 g) mashed potatoes or  of a large baked potato.  4 oz (113 g) canned or frozen fruit.  4 oz (120 mL) fruit juice.  4-6 crackers.  6 chicken nuggets.  6 oz (170 g) unsweetened dry cereal.  6 oz (170 g) plain fat-free yogurt or yogurt sweetened with artificial sweeteners.  8 oz (240 mL) milk.  8 oz (170 g) fresh fruit or one small piece of fruit.  24 oz (680 g) popped popcorn. Example of carbohydrate counting Sample meal  3 oz (85 g) chicken breast.  6 oz (170 g)   brown rice.  4 oz (113 g) corn.  8 oz (240 mL) milk.  8 oz (170 g) strawberries with sugar-free whipped topping. Carbohydrate calculation 1. Identify the foods that contain carbohydrates: ? Rice. ? Corn. ? Milk. ? Strawberries. 2. Calculate how many servings you have of each food: ? 2 servings rice. ? 1 serving corn. ? 1 serving milk. ? 1 serving strawberries. 3. Multiply each number of servings by 15 g: ? 2 servings rice x 15 g = 30 g. ? 1 serving corn x 15 g = 15 g. ? 1 serving milk x 15 g = 15 g. ? 1 serving  strawberries x 15 g = 15 g. 4. Add together all of the amounts to find the total grams of carbohydrates eaten: ? 30 g + 15 g + 15 g + 15 g = 75 g of carbohydrates total. Summary  Carbohydrate counting is a method of keeping track of how many carbohydrates you eat.  Eating carbohydrates naturally increases the amount of sugar (glucose) in the blood.  Counting how many carbohydrates you eat helps keep your blood glucose within normal limits, which helps you manage your diabetes.  A diet and nutrition specialist (registered dietitian) can help you make a meal plan and calculate how many carbohydrates you should have at each meal and snack. This information is not intended to replace advice given to you by your health care provider. Make sure you discuss any questions you have with your health care provider. Document Released: 06/09/2005 Document Revised: 01/01/2017 Document Reviewed: 11/21/2015 Elsevier Patient Education  2020 [employer] Maintenance After Age 75 After age 63, you are at a higher risk for certain long-term diseases and infections as well as injuries from falls. Falls are a major cause of broken bones and head injuries in people who are older than age 59. Getting regular preventive care can help to keep you healthy and well. Preventive care includes getting regular testing and making lifestyle changes as recommended by your health care provider. Talk with your health care provider about:  Which screenings and tests you should have. A screening is a test that checks for a disease when you have no symptoms.  A diet and exercise plan that is right for you. What should I know about screenings and tests to prevent falls? Screening and testing are the best ways to find a health problem early. Early diagnosis and treatment give you the best chance of managing medical conditions that are common after age 21. Certain conditions and lifestyle choices may make you more likely to  have a fall. Your health care provider may recommend:  Regular vision checks. Poor vision and conditions such as cataracts can make you more likely to have a fall. If you wear glasses, make sure to get your prescription updated if your vision changes.  Medicine review. Work with your health care provider to regularly review all of the medicines you are taking, including over-the-counter medicines. Ask your health care provider about any side effects that may make you more likely to have a fall. Tell your health care provider if any medicines that you take make you feel dizzy or sleepy.  Osteoporosis screening. Osteoporosis is a condition that causes the bones to get weaker. This can make the bones weak and cause them to break more easily.  Blood pressure screening. Blood pressure changes and medicines to control blood pressure can make you feel dizzy.  Strength and balance checks. Your health care provider may  recommend certain tests to check your strength and balance while standing, walking, or changing positions.  Foot health exam. Foot pain and numbness, as well as not wearing proper footwear, can make you more likely to have a fall.  Depression screening. You may be more likely to have a fall if you have a fear of falling, feel emotionally low, or feel unable to do activities that you used to do.  Alcohol use screening. Using too much alcohol can affect your balance and may make you more likely to have a fall. What actions can I take to lower my risk of falls? General instructions  Talk with your health care provider about your risks for falling. Tell your health care provider if: ? You fall. Be sure to tell your health care provider about all falls, even ones that seem minor. ? You feel dizzy, sleepy, or off-balance.  Take over-the-counter and prescription medicines only as told by your health care provider. These include any supplements.  Eat a healthy diet and maintain a healthy  weight. A healthy diet includes low-fat dairy products, low-fat (lean) meats, and fiber from whole grains, beans, and lots of fruits and vegetables. Home safety  Remove any tripping hazards, such as rugs, cords, and clutter.  Install safety equipment such as grab bars in bathrooms and safety rails on stairs.  Keep rooms and walkways well-lit. Activity   Follow a regular exercise program to stay fit. This will help you maintain your balance. Ask your health care provider what types of exercise are appropriate for you.  If you need a cane or walker, use it as recommended by your health care provider.  Wear supportive shoes that have nonskid soles. Lifestyle  Do not drink alcohol if your health care provider tells you not to drink.  If you drink alcohol, limit how much you have: ? 0-1 drink a day for women. ? 0-2 drinks a day for men.  Be aware of how much alcohol is in your drink. In the [place] one drink equals one typical bottle of beer (12 oz), one-half glass of wine (5 oz), or one shot of hard liquor (1 oz).  Do not use any products that contain nicotine or tobacco, such as cigarettes and e-cigarettes. If you need help quitting, ask your health care provider. Summary  Having a healthy lifestyle and getting preventive care can help to protect your health and wellness after age 40.  Screening and testing are the best way to find a health problem early and help you avoid having a fall. Early diagnosis and treatment give you the best chance for managing medical conditions that are more common for people who are older than age 52.  Falls are a major cause of broken bones and head injuries in people who are older than age 61. Take precautions to prevent a fall at home.  Work with your health care provider to learn what changes you can make to improve your health and wellness and to prevent falls. This information is not intended to replace advice given to you by your health care  provider. Make sure you discuss any questions you have with your health care provider. Document Released: 04/22/2017 Document Revised: 09/30/2018 Document Reviewed: 04/22/2017 Elsevier Patient Education  2020 [employer].

## 2018-12-20 NOTE — Progress Notes (Signed)
Christine Morales April 10, 1946 [id number]    History:    Presents for an pelvic exam.  09-Oct-2006 TAH for fibroids on no HRT.  Normal Pap and mammogram history.  10/08/09 diagnosed with non-Hodgkin's lymphoma doing well on no medication.  Oct 09, 2006- colonoscopy.  Not sexually active, husband died in October 09, 2015.  Daughter has since moved in with her with her children doing well.  10/09/2014 normal DEXA at primary care.  Hypertension and hypothyroidism managed at primary care.  Vaccines current.  Past medical history, past surgical history, family history and social history were all reviewed and documented in the EPIC chart.  Son and a daughter and 7 grandchildren all doing well.  ROS:  A ROS was performed and pertinent positives and negatives are included.  Exam:  Vitals:   12/20/18 1029  BP: 122/83  Weight: 229 lb 3.2 oz (104 kg)  Height: 5\' 7"  (1.702 m)   Body mass index is 35.9 kg/m.   General appearance:  Normal Thyroid:  Symmetrical, normal in size, without palpable masses or nodularity. Respiratory  Auscultation:  Clear without wheezing or rhonchi Cardiovascular  Auscultation:  Regular rate, without rubs, murmurs or gallops  Edema/varicosities:  Not grossly evident Abdominal  Soft,nontender, without masses, guarding or rebound.  Liver/spleen:  No organomegaly noted  Hernia:  None appreciated  Skin  Inspection:  Grossly normal   Breasts: Examined lying and sitting. History of breast reduction    Right: Without masses, retractions, discharge or axillary adenopathy.     Left: Without masses, retractions, discharge or axillary adenopathy. Gentitourinary   Inguinal/mons:  Normal without inguinal adenopathy  External genitalia:  Normal  BUS/Urethra/Skene's glands:  Normal  Vagina:  Normal  Cervix: And uterus absent  Adnexa/parametria:     Rt: Without masses or tenderness.   Lt: Without masses or tenderness.  Anus and perineum: Normal  Digital rectal exam: Normal sphincter tone without palpated masses or  tenderness  Assessment/Plan:  73 y.o. W BF G2 P2 for breast and pelvic exam with no complaints. 10/09/06 TAH for fibroids on no HRT 10/08/09 non-Hodgkin's lymphoma no medication doing stable Hypertension/hypothyroidism-primary care manages labs and meds Obesity  Plan: SBEs, continue annual screening mammogram, calcium rich foods, vitamin D 2000 daily encouraged.  Repeat DEXA, reviewed importance of weightbearing and balance type exercise, yoga encouraged.  Home safety, fall prevention discussed.  Reviewed importance of decreasing calories/carbs.  Repeat colonoscopy instructed to schedule.    [place], 12:30 PM 12/20/2018

## 2018-12-23 ENCOUNTER — Other Ambulatory Visit (INDEPENDENT_AMBULATORY_CARE_PROVIDER_SITE_OTHER): Payer: Medicare Other

## 2018-12-23 ENCOUNTER — Encounter: Payer: Self-pay | Admitting: Internal Medicine

## 2018-12-23 ENCOUNTER — Ambulatory Visit (INDEPENDENT_AMBULATORY_CARE_PROVIDER_SITE_OTHER): Payer: Medicare Other | Admitting: Internal Medicine

## 2018-12-23 ENCOUNTER — Other Ambulatory Visit: Payer: Self-pay

## 2018-12-23 DIAGNOSIS — M79672 Pain in left foot: Secondary | ICD-10-CM | POA: Diagnosis not present

## 2018-12-23 DIAGNOSIS — M25572 Pain in left ankle and joints of left foot: Secondary | ICD-10-CM | POA: Insufficient documentation

## 2018-12-23 DIAGNOSIS — E538 Deficiency of other specified B group vitamins: Secondary | ICD-10-CM

## 2018-12-23 LAB — BASIC METABOLIC PANEL
BUN: 28 mg/dL — ABNORMAL HIGH (ref 6–23)
CO2: 28 mEq/L (ref 19–32)
Calcium: 10 mg/dL (ref 8.4–10.5)
Chloride: 105 mEq/L (ref 96–112)
Creatinine, Ser: 1.99 mg/dL — ABNORMAL HIGH (ref 0.40–1.20)
GFR: 29.91 mL/min — ABNORMAL LOW (ref >60.00)
Glucose, Bld: 94 mg/dL (ref 70–99)
Potassium: 4.6 mEq/L (ref 3.5–5.1)
Sodium: 141 mEq/L (ref 135–145)

## 2018-12-23 LAB — HEPATIC FUNCTION PANEL
ALT: 13 U/L (ref 0–35)
AST: 19 U/L (ref 0–37)
Albumin: 4.3 g/dL (ref 3.5–5.2)
Alkaline Phosphatase: 74 U/L (ref 39–117)
Bilirubin, Direct: 0.1 mg/dL (ref 0.0–0.3)
Total Bilirubin: 0.7 mg/dL (ref 0.2–1.2)
Total Protein: 7 g/dL (ref 6.0–8.3)

## 2018-12-23 LAB — URIC ACID: Uric Acid, Serum: 8.1 mg/dL — ABNORMAL HIGH (ref 2.4–7.0)

## 2018-12-23 NOTE — Progress Notes (Signed)
Subjective:  Patient ID: Christine Morales, female    DOB: [birth year]  Age: 73 y.o. MRN: [id number]  CC: No chief complaint on file.   HPI Christine Morales presents for HTN, hypothyroidism, B12 def C/o L foot pain x 2 mo 1st MTP  Outpatient Medications Prior to Visit  Medication Sig Dispense Refill  . amLODipine (NORVASC) 5 MG tablet TAKE 1 TABLET BY MOUTH EVERY DAY 90 tablet 3  . Aspirin-Salicylamide-Caffeine (BC HEADACHE PO) Take 1 packet by mouth daily as needed (headache).    . Cholecalciferol (VITAMIN D3) 1000 UNITS tablet Take 1,000 Units by mouth daily.      . Cyanocobalamin (VITAMIN B 12 PO) Take 1,000 mcg by mouth daily.    . ferrous sulfate (FERRO-BOB) 325 (65 FE) MG tablet Take 325 mg by mouth daily with breakfast.      . levothyroxine (SYNTHROID, LEVOTHROID) 100 MCG tablet TAKE 1 TABLET BY MOUTH ONCE DAILY 90 tablet 1  . losartan (COZAAR) 100 MG tablet TAKE 1 TABLET BY MOUTH EVERY DAY 90 tablet 3   Facility-Administered Medications Prior to Visit  Medication Dose Route Frequency Provider Last Rate Last Dose  . heparin lock flush 100 unit/mL  500 Units Intravenous Once Shadad, Firas N, MD      . sodium chloride 0.9 % injection 10 mL  10 mL Intravenous PRN Wyatt Portela, MD      . sodium chloride 0.9 % injection 10 mL  10 mL Intravenous PRN Wyatt Portela, MD   10 mL at 05/20/17 0956    ROS: Review of Systems  Constitutional: Negative for activity change, appetite change, chills, fatigue and unexpected weight change.  HENT: Negative for congestion, mouth sores and sinus pressure.   Eyes: Negative for visual disturbance.  Respiratory: Negative for cough and chest tightness.   Gastrointestinal: Negative for abdominal pain and nausea.  Genitourinary: Negative for difficulty urinating, frequency and vaginal pain.  Musculoskeletal: Positive for arthralgias. Negative for back pain and gait problem.  Skin: Negative for pallor and rash.  Neurological: Negative for dizziness,  tremors, weakness, numbness and headaches.  Psychiatric/Behavioral: Negative for confusion and sleep disturbance.    Objective:  BP 128/82 (BP Location: Left Arm, Patient Position: Sitting, Cuff Size: Normal)   Pulse 61   Temp 98.1 F (36.7 C) (Oral)   Ht 5\' 7"  (1.702 m)   Wt 228 lb (103.4 kg)   SpO2 97%   BMI 35.71 kg/m   BP Readings from Last 3 Encounters:  12/23/18 128/82  12/20/18 122/83  06/24/18 124/76    Wt Readings from Last 3 Encounters:  12/23/18 228 lb (103.4 kg)  12/20/18 229 lb 3.2 oz (104 kg)  06/24/18 223 lb (101.2 kg)    Physical Exam Constitutional:      General: She is not in acute distress.    Appearance: She is well-developed.  HENT:     Head: Normocephalic.     Right Ear: External ear normal.     Left Ear: External ear normal.     Nose: Nose normal.  Eyes:     General:        Right eye: No discharge.        Left eye: No discharge.     Conjunctiva/sclera: Conjunctivae normal.     Pupils: Pupils are equal, round, and reactive to light.  Neck:     Musculoskeletal: Normal range of motion and neck supple.     Thyroid: No thyromegaly.  Vascular: No JVD.     Trachea: No tracheal deviation.  Cardiovascular:     Rate and Rhythm: Normal rate and regular rhythm.     Heart sounds: Normal heart sounds.  Pulmonary:     Effort: No respiratory distress.     Breath sounds: No stridor. No wheezing.  Abdominal:     General: Bowel sounds are normal. There is no distension.     Palpations: Abdomen is soft. There is no mass.     Tenderness: There is no abdominal tenderness. There is no guarding or rebound.  Musculoskeletal:        General: Tenderness present.  Lymphadenopathy:     Cervical: No cervical adenopathy.  Skin:    Findings: No erythema or rash.  Neurological:     Cranial Nerves: No cranial nerve deficit.     Motor: No abnormal muscle tone.     Coordination: Coordination normal.     Deep Tendon Reflexes: Reflexes normal.  Psychiatric:         Behavior: Behavior normal.        Thought Content: Thought content normal.        Judgment: Judgment normal.    L foot pain x 2 mo 1st MTP - painful w/decr ROM Obese  Lab Results  Component Value Date   WBC 6.2 06/24/2018   HGB 13.1 06/24/2018   HCT 39.1 06/24/2018   PLT 239.0 06/24/2018   GLUCOSE 106 (H) 08/26/2018   CHOL 185 06/24/2018   TRIG 132.0 06/24/2018   HDL 49.10 06/24/2018   LDLCALC 110 (H) 06/24/2018   ALT 13 06/24/2018   AST 16 06/24/2018   NA 139 08/26/2018   K 4.1 08/26/2018   CL 104 08/26/2018   CREATININE 1.82 (H) 08/26/2018   BUN 23 08/26/2018   CO2 29 08/26/2018   TSH 0.22 (L) 08/26/2018   INR 0.94 12/15/2017    Ir Removal Tun Access W/ Port W/o Fl  Result Date: 12/15/2017 CLINICAL DATA:  History of follicular lymphoma. Port catheter placed 01/21/2010 by Dr. Annamaria Boots, worked well without complication, no longer needed. Removal requested. EXAM: EXAM TUNNELED PORT CATHETER REMOVAL TECHNIQUE: The procedure, risks (including but not limited to bleeding, infection, organ damage ), benefits, and alternatives were explained to the patient. Questions regarding the procedure were encouraged and answered. The patient understands and consents to the procedure. Intravenous Fentanyl and Versed were administered as conscious sedation during continuous monitoring of the patient's level of consciousness and physiological / cardiorespiratory status by the radiology RN, with a total moderate sedation time of 18 minutes. Overlying skin prepped with chlorhexidine, draped in usual sterile fashion, infiltrated locally with 1% lidocaine. A small incision was made over the scar from previous placement. The port catheter was dissected free from the underlying soft tissues and removed intact. Hemostasis was achieved. The port pocket was closed with deep interrupted and subcuticular continuous 3-0 Monocryl sutures, then covered with Dermabond. The patient tolerated the procedure well.  COMPLICATIONS: COMPLICATIONS None immediate IMPRESSION: 1.  Technically successful tunneled Port catheter removal. Electronically Signed   By: Lucrezia Europe M.D.   On: 12/15/2017 10:08    Assessment & Plan:   There are no diagnoses linked to this encounter.   No orders of the defined types were placed in this encounter.    Follow-up: No follow-ups on file.  Walker Kehr, MD

## 2018-12-23 NOTE — Assessment & Plan Note (Signed)
On B12 

## 2018-12-23 NOTE — Assessment & Plan Note (Signed)
L foot pain x 2 mo 1st MTP Labs X ray

## 2018-12-23 NOTE — Assessment & Plan Note (Signed)
Sharman Cheek book

## 2018-12-23 NOTE — Patient Instructions (Addendum)
"  The Obesity Code" by Sharman Cheek  If you have medicare related insurance (such as traditional Medicare, [employer], [employer], or similar), Please make an appointment at the scheduling desk with Sharee Pimple, the [employer], for your Wellness visit in this office, which is a benefit with your insurance.

## 2018-12-24 LAB — RHEUMATOID FACTOR: Rhuematoid fact SerPl-aCnc: <14 IU/mL (ref <14)

## 2018-12-27 ENCOUNTER — Other Ambulatory Visit: Payer: Self-pay | Admitting: Internal Medicine

## 2018-12-27 MED ORDER — COLCHICINE 0.6 MG PO TABS: ORAL_TABLET | ORAL | 3 refills | Status: DC

## 2018-12-27 MED ORDER — ALLOPURINOL 100 MG PO TABS: 50.0000 mg | ORAL_TABLET | Freq: Every day | ORAL | 3 refills | Status: DC

## 2019-01-11 ENCOUNTER — Ambulatory Visit: Payer: Medicare Other | Admitting: Podiatry

## 2019-01-11 ENCOUNTER — Encounter: Payer: Self-pay | Admitting: Podiatry

## 2019-01-11 ENCOUNTER — Ambulatory Visit (INDEPENDENT_AMBULATORY_CARE_PROVIDER_SITE_OTHER): Payer: Medicare Other

## 2019-01-11 ENCOUNTER — Other Ambulatory Visit: Payer: Self-pay

## 2019-01-11 VITALS — Temp 97.5°F

## 2019-01-11 DIAGNOSIS — M779 Enthesopathy, unspecified: Secondary | ICD-10-CM

## 2019-01-11 DIAGNOSIS — M778 Other enthesopathies, not elsewhere classified: Secondary | ICD-10-CM

## 2019-01-11 MED ORDER — METHYLPREDNISOLONE 4 MG PO TBPK: ORAL_TABLET | ORAL | 0 refills | Status: DC

## 2019-01-11 NOTE — Progress Notes (Signed)
She presents today with pain to the first metatarsal phalangeal joint dorsal aspect of the left foot.  States that is been aching now for about 2 months with swelling.  Last week PCP evaluated and thought it had been gout taking uric acid levels did demonstrate a level of 8.1.  Objective: Vital signs are stable alert and oriented x3.  Pulses are palpable.  Reviewed her labs which were taken previously demonstrating a uric acid of 8.1 and elevated sed rate.  She has strong palpable pulses with warmth overlying the first metatarsophalangeal joint of the dorsal lateral aspect of the left foot.  Osteoarthritic changes first metatarsophalangeal joint with mild bunion deformity present.  Assessment: Probably gouty after capsulitis first metatarsophalangeal joint left.  Plan: I recommended she continue her Zyloprim and her colchicine and I also recommended an injection which she declined and I also recommended a steroid which she did take.  I will follow-up with her in 1 month should she have questions or concerns or recurrence she is to notify us immediately for blood draw.

## 2019-03-15 ENCOUNTER — Other Ambulatory Visit: Payer: Self-pay | Admitting: Internal Medicine

## 2019-05-10 ENCOUNTER — Ambulatory Visit: Payer: Medicare Other | Admitting: Podiatry

## 2019-05-10 ENCOUNTER — Encounter: Payer: Self-pay | Admitting: Podiatry

## 2019-05-10 ENCOUNTER — Other Ambulatory Visit: Payer: Self-pay

## 2019-05-10 DIAGNOSIS — Q828 Other specified congenital malformations of skin: Secondary | ICD-10-CM | POA: Diagnosis not present

## 2019-05-10 NOTE — Progress Notes (Signed)
She presents today chief complaint of painful area beneath the fifth metatarsophalangeal joint of the left foot.  She denies fever chills nausea vomiting muscle aches and pains.  Objective: Pulses are strong and palpable.  She has an area plantar lateral aspect of the fifth metatarsal head of the left foot that appears to be a porokeratotic lesion there is no signs of infection purulence no malodor.  Assessment: Porokeratosis fifth left.  Plan: Went ahead and debrided the reactive hyperkeratotic tissue placed padding discussed appropriate shoe gear and I will follow-up with her as needed.

## 2019-07-13 ENCOUNTER — Other Ambulatory Visit (INDEPENDENT_AMBULATORY_CARE_PROVIDER_SITE_OTHER): Payer: Medicare PPO

## 2019-07-13 DIAGNOSIS — N189 Chronic kidney disease, unspecified: Secondary | ICD-10-CM

## 2019-07-13 DIAGNOSIS — E039 Hypothyroidism, unspecified: Secondary | ICD-10-CM | POA: Diagnosis not present

## 2019-07-13 LAB — BASIC METABOLIC PANEL
BUN: 23 mg/dL (ref 6–23)
CO2: 29 mEq/L (ref 19–32)
Calcium: 9.7 mg/dL (ref 8.4–10.5)
Chloride: 104 mEq/L (ref 96–112)
Creatinine, Ser: 1.91 mg/dL — ABNORMAL HIGH (ref 0.40–1.20)
GFR: 31.31 mL/min — ABNORMAL LOW (ref >60.00)
Glucose, Bld: 97 mg/dL (ref 70–99)
Potassium: 4.6 mEq/L (ref 3.5–5.1)
Sodium: 140 mEq/L (ref 135–145)

## 2019-07-13 LAB — TSH: TSH: 1.1 u[IU]/mL (ref 0.35–4.50)

## 2019-07-13 LAB — T4, FREE: Free T4: 0.93 ng/dL (ref 0.60–1.60)

## 2019-07-18 ENCOUNTER — Ambulatory Visit (INDEPENDENT_AMBULATORY_CARE_PROVIDER_SITE_OTHER): Payer: Medicare PPO | Admitting: Internal Medicine

## 2019-07-18 ENCOUNTER — Encounter: Payer: Self-pay | Admitting: Internal Medicine

## 2019-07-18 ENCOUNTER — Other Ambulatory Visit: Payer: Self-pay

## 2019-07-18 DIAGNOSIS — N1832 Chronic kidney disease, stage 3b: Secondary | ICD-10-CM

## 2019-07-18 DIAGNOSIS — C8233 Follicular lymphoma grade IIIa, intra-abdominal lymph nodes: Secondary | ICD-10-CM

## 2019-07-18 DIAGNOSIS — D509 Iron deficiency anemia, unspecified: Secondary | ICD-10-CM | POA: Diagnosis not present

## 2019-07-18 DIAGNOSIS — E039 Hypothyroidism, unspecified: Secondary | ICD-10-CM

## 2019-07-18 DIAGNOSIS — E538 Deficiency of other specified B group vitamins: Secondary | ICD-10-CM | POA: Diagnosis not present

## 2019-07-18 NOTE — Assessment & Plan Note (Signed)
On B12 

## 2019-07-18 NOTE — Assessment & Plan Note (Signed)
F/u w/oncology 

## 2019-07-18 NOTE — Patient Instructions (Signed)

## 2019-07-18 NOTE — Assessment & Plan Note (Signed)
Levothroid 

## 2019-07-18 NOTE — Assessment & Plan Note (Signed)
Wt Readings from Last 3 Encounters:  07/18/19 231 lb (104.8 kg)  12/23/18 228 lb (103.4 kg)  12/20/18 229 lb 3.2 oz (104 kg)

## 2019-07-18 NOTE — Assessment & Plan Note (Signed)
St 3b GFR 31

## 2019-07-18 NOTE — Progress Notes (Signed)
Subjective:  Patient ID: Christine Morales, female    DOB: 1945/08/13  Age: 75 y.o. MRN: [id number]  CC: No chief complaint on file.   HPI Christine Morales presents for hypothyroidism, HTN, CRF f/u  Outpatient Medications Prior to Visit  Medication Sig Dispense Refill  . allopurinol (ZYLOPRIM) 100 MG tablet Take 0.5 tablets (50 mg total) by mouth daily. 45 tablet 3  . amLODipine (NORVASC) 5 MG tablet TAKE 1 TABLET BY MOUTH EVERY DAY 90 tablet 3  . Aspirin-Salicylamide-Caffeine (BC HEADACHE PO) Take 1 packet by mouth daily as needed (headache).    . Cholecalciferol (VITAMIN D3) 1000 UNITS tablet Take 1,000 Units by mouth daily.      . colchicine 0.6 MG tablet Take two tablets prn gout attack.Then take another one in 1-2 hrs. Do not repeat for 3 days. 18 tablet 3  . Cyanocobalamin (VITAMIN B 12 PO) Take 1,000 mcg by mouth daily.    . ferrous sulfate (FERRO-BOB) 325 (65 FE) MG tablet Take 325 mg by mouth daily with breakfast.      . levothyroxine (SYNTHROID) 100 MCG tablet TAKE 1 TABLET BY MOUTH EVERY DAY 90 tablet 3  . losartan (COZAAR) 100 MG tablet TAKE 1 TABLET BY MOUTH EVERY DAY 90 tablet 3   Facility-Administered Medications Prior to Visit  Medication Dose Route Frequency Provider Last Rate Last Admin  . heparin lock flush 100 unit/mL  500 Units Intravenous Once Shadad, Firas N, MD      . sodium chloride 0.9 % injection 10 mL  10 mL Intravenous PRN Wyatt Portela, MD      . sodium chloride 0.9 % injection 10 mL  10 mL Intravenous PRN Wyatt Portela, MD   10 mL at 05/20/17 0956    ROS: Review of Systems  Constitutional: Positive for unexpected weight change. Negative for activity change, appetite change, chills and fatigue.  HENT: Negative for congestion, mouth sores and sinus pressure.   Eyes: Negative for visual disturbance.  Respiratory: Negative for cough and chest tightness.   Gastrointestinal: Negative for abdominal pain and nausea.  Genitourinary: Negative for difficulty  urinating, frequency and vaginal pain.  Musculoskeletal: Negative for back pain and gait problem.  Skin: Negative for pallor and rash.  Neurological: Negative for dizziness, tremors, weakness, numbness and headaches.  Psychiatric/Behavioral: Negative for confusion and sleep disturbance.    Objective:  BP 130/76 (BP Location: Left Arm, Patient Position: Sitting, Cuff Size: Large)   Pulse 68   Temp 98.5 F (36.9 C) (Oral)   Ht 5\' 7"  (1.702 m)   Wt 231 lb (104.8 kg)   SpO2 99%   BMI 36.18 kg/m   BP Readings from Last 3 Encounters:  07/18/19 130/76  12/23/18 128/82  12/20/18 122/83    Wt Readings from Last 3 Encounters:  07/18/19 231 lb (104.8 kg)  12/23/18 228 lb (103.4 kg)  12/20/18 229 lb 3.2 oz (104 kg)    Physical Exam Constitutional:      General: She is not in acute distress.    Appearance: She is well-developed.  HENT:     Head: Normocephalic.     Right Ear: External ear normal.     Left Ear: External ear normal.     Nose: Nose normal.  Eyes:     General:        Right eye: No discharge.        Left eye: No discharge.     Conjunctiva/sclera: Conjunctivae normal.  Pupils: Pupils are equal, round, and reactive to light.  Neck:     Thyroid: No thyromegaly.     Vascular: No JVD.     Trachea: No tracheal deviation.  Cardiovascular:     Rate and Rhythm: Normal rate and regular rhythm.     Heart sounds: Normal heart sounds.  Pulmonary:     Effort: No respiratory distress.     Breath sounds: No stridor. No wheezing.  Abdominal:     General: Bowel sounds are normal. There is no distension.     Palpations: Abdomen is soft. There is no mass.     Tenderness: There is no abdominal tenderness. There is no guarding or rebound.  Musculoskeletal:        General: No tenderness.     Cervical back: Normal range of motion and neck supple.  Lymphadenopathy:     Cervical: No cervical adenopathy.  Skin:    Findings: No erythema or rash.  Neurological:     Mental  Status: She is oriented to person, place, and time.     Cranial Nerves: No cranial nerve deficit.     Motor: No abnormal muscle tone.     Coordination: Coordination normal.     Deep Tendon Reflexes: Reflexes normal.  Psychiatric:        Behavior: Behavior normal.        Thought Content: Thought content normal.        Judgment: Judgment normal.     Lab Results  Component Value Date   WBC 6.2 06/24/2018   HGB 13.1 06/24/2018   HCT 39.1 06/24/2018   PLT 239.0 06/24/2018   GLUCOSE 97 07/13/2019   CHOL 185 06/24/2018   TRIG 132.0 06/24/2018   HDL 49.10 06/24/2018   LDLCALC 110 (H) 06/24/2018   ALT 13 12/23/2018   AST 19 12/23/2018   NA 140 07/13/2019   K 4.6 07/13/2019   CL 104 07/13/2019   CREATININE 1.91 (H) 07/13/2019   BUN 23 07/13/2019   CO2 29 07/13/2019   TSH 1.10 07/13/2019   INR 0.94 12/15/2017    IR Removal Tun Access W/ Port W/O FL  Result Date: 12/15/2017 CLINICAL DATA:  History of follicular lymphoma. Port catheter placed 01/21/2010 by Dr. Annamaria Boots, worked well without complication, no longer needed. Removal requested. EXAM: EXAM TUNNELED PORT CATHETER REMOVAL TECHNIQUE: The procedure, risks (including but not limited to bleeding, infection, organ damage ), benefits, and alternatives were explained to the patient. Questions regarding the procedure were encouraged and answered. The patient understands and consents to the procedure. Intravenous Fentanyl and Versed were administered as conscious sedation during continuous monitoring of the patient's level of consciousness and physiological / cardiorespiratory status by the radiology RN, with a total moderate sedation time of 18 minutes. Overlying skin prepped with chlorhexidine, draped in usual sterile fashion, infiltrated locally with 1% lidocaine. A small incision was made over the scar from previous placement. The port catheter was dissected free from the underlying soft tissues and removed intact. Hemostasis was achieved.  The port pocket was closed with deep interrupted and subcuticular continuous 3-0 Monocryl sutures, then covered with Dermabond. The patient tolerated the procedure well. COMPLICATIONS: COMPLICATIONS None immediate IMPRESSION: 1.  Technically successful tunneled Port catheter removal. Electronically Signed   By: Lucrezia Europe M.D.   On: 12/15/2017 10:08    Assessment & Plan:   There are no diagnoses linked to this encounter.   No orders of the defined types were placed in this encounter.  Follow-up: No follow-ups on file.  Walker Kehr, MD

## 2019-08-19 ENCOUNTER — Ambulatory Visit: Payer: Medicare PPO | Attending: Internal Medicine

## 2019-08-19 DIAGNOSIS — Z23 Encounter for immunization: Secondary | ICD-10-CM

## 2019-08-19 NOTE — Progress Notes (Signed)
   Covid-19 Vaccination Clinic  Name:  Christine Morales    MRN: [id number] DOB: [birth year]  08/19/2019  [name] was observed post Covid-19 immunization for 15 minutes without incidence. She was provided with Vaccine Information Sheet and instruction to access the V-Safe system.   [name] was instructed to call 911 with any severe reactions post vaccine: Marland Kitchen Difficulty breathing  . Swelling of your face and throat  . A fast heartbeat  . A bad rash all over your body  . Dizziness and weakness    Immunizations Administered    Name Date Dose VIS Date Route   Pfizer COVID-19 Vaccine 08/19/2019  8:37 AM 0.3 mL 06/03/2019 Intramuscular   Manufacturer: [place]   Lot: [id number]   [place]: [id number]

## 2019-08-31 ENCOUNTER — Emergency Department (HOSPITAL_COMMUNITY): Payer: Medicare PPO

## 2019-08-31 ENCOUNTER — Emergency Department (HOSPITAL_COMMUNITY)
Admission: EM | Admit: 2019-08-31 | Discharge: 2019-08-31 | Disposition: A | Discharge status: Home / Self Care | Payer: Medicare PPO | Attending: Emergency Medicine | Admitting: Emergency Medicine

## 2019-08-31 ENCOUNTER — Other Ambulatory Visit: Payer: Self-pay

## 2019-08-31 DIAGNOSIS — I10 Essential (primary) hypertension: Secondary | ICD-10-CM | POA: Diagnosis not present

## 2019-08-31 DIAGNOSIS — Z743 Need for continuous supervision: Secondary | ICD-10-CM | POA: Diagnosis not present

## 2019-08-31 DIAGNOSIS — R42 Dizziness and giddiness: Secondary | ICD-10-CM | POA: Diagnosis not present

## 2019-08-31 DIAGNOSIS — R531 Weakness: Secondary | ICD-10-CM | POA: Diagnosis not present

## 2019-08-31 DIAGNOSIS — Z8572 Personal history of non-Hodgkin lymphomas: Secondary | ICD-10-CM | POA: Diagnosis not present

## 2019-08-31 DIAGNOSIS — E669 Obesity, unspecified: Secondary | ICD-10-CM | POA: Insufficient documentation

## 2019-08-31 DIAGNOSIS — R110 Nausea: Secondary | ICD-10-CM | POA: Diagnosis not present

## 2019-08-31 DIAGNOSIS — Z79899 Other long term (current) drug therapy: Secondary | ICD-10-CM | POA: Insufficient documentation

## 2019-08-31 DIAGNOSIS — Z6836 Body mass index (BMI) 36.0-36.9, adult: Secondary | ICD-10-CM | POA: Diagnosis not present

## 2019-08-31 LAB — CBC WITH DIFFERENTIAL/PLATELET
Abs Immature Granulocytes: 0.02 10*3/uL (ref 0.00–0.07)
Basophils Absolute: 0 10*3/uL (ref 0.0–0.1)
Basophils Relative: 1 %
Eosinophils Absolute: 0.2 10*3/uL (ref 0.0–0.5)
Eosinophils Relative: 2 %
HCT: 41.8 % (ref 36.0–46.0)
Hemoglobin: 13 g/dL (ref 12.0–15.0)
Immature Granulocytes: 0 %
Lymphocytes Relative: 25 %
Lymphs Abs: 1.6 10*3/uL (ref 0.7–4.0)
MCH: 28.2 pg (ref 26.0–34.0)
MCHC: 31.1 g/dL (ref 30.0–36.0)
MCV: 90.7 fL (ref 80.0–100.0)
Monocytes Absolute: 0.5 10*3/uL (ref 0.1–1.0)
Monocytes Relative: 8 %
Neutro Abs: 4.2 10*3/uL (ref 1.7–7.7)
Neutrophils Relative %: 64 %
Platelets: 242 10*3/uL (ref 150–400)
RBC: 4.61 MIL/uL (ref 3.87–5.11)
RDW: 14.5 % (ref 11.5–15.5)
WBC: 6.4 10*3/uL (ref 4.0–10.5)
nRBC: 0 % (ref 0.0–0.2)

## 2019-08-31 LAB — BASIC METABOLIC PANEL
Anion gap: 11 (ref 5–15)
BUN: 22 mg/dL (ref 8–23)
CO2: 21 mmol/L — ABNORMAL LOW (ref 22–32)
Calcium: 9.1 mg/dL (ref 8.9–10.3)
Chloride: 107 mmol/L (ref 98–111)
Creatinine, Ser: 1.93 mg/dL — ABNORMAL HIGH (ref 0.44–1.00)
GFR calc Af Amer: 30 mL/min — ABNORMAL LOW (ref >60)
GFR calc non Af Amer: 26 mL/min — ABNORMAL LOW (ref >60)
Glucose, Bld: 182 mg/dL — ABNORMAL HIGH (ref 70–99)
Potassium: 5.6 mmol/L — ABNORMAL HIGH (ref 3.5–5.1)
Sodium: 139 mmol/L (ref 135–145)

## 2019-08-31 LAB — MAGNESIUM: Magnesium: 2.5 mg/dL — ABNORMAL HIGH (ref 1.7–2.4)

## 2019-08-31 LAB — TROPONIN I (HIGH SENSITIVITY): Troponin I (High Sensitivity): 3 ng/L (ref <18)

## 2019-08-31 IMAGING — CT CT HEAD W/O CM
3 of 4 series · 16 of 47 positions shown, 19 images · non-contrast
Comparison: None.

CLINICAL DATA: Dizziness and weakness

EXAM:
CT HEAD WITHOUT CONTRAST
TECHNIQUE: Contiguous axial images were obtained from the base of the skull
through the vertex without intravenous contrast.

[Series 4: head 2.0 h70h · axial · 0.44mm/px · z∈[-151,-5]mm · 10 of 83 slices shown, 13 images]
[im 5/83  brain]
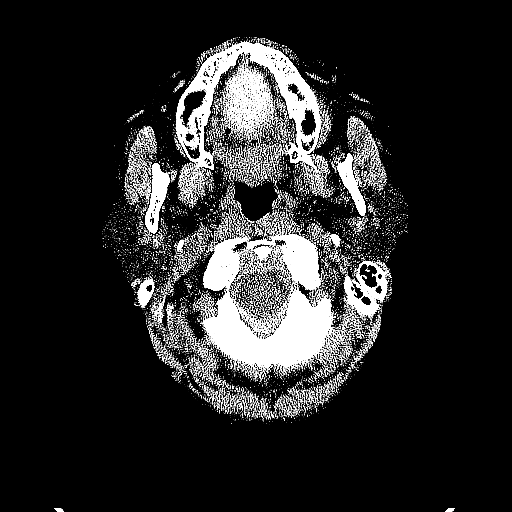
[im 5/83  bone]
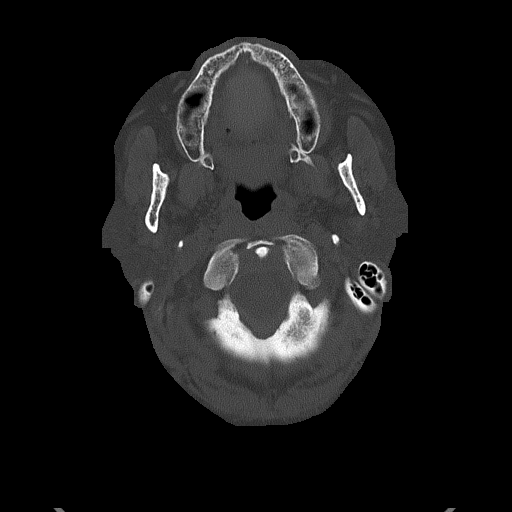
[im 13/83  brain]
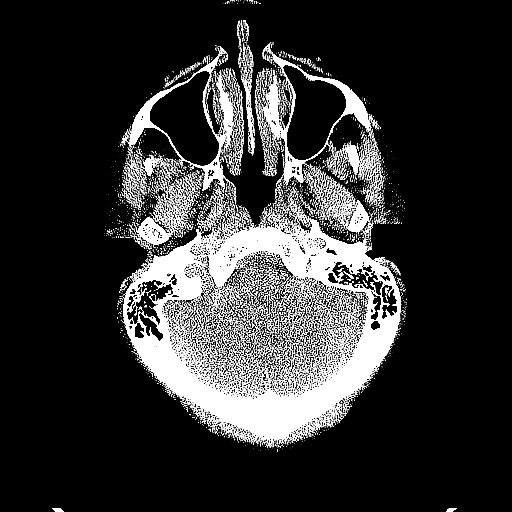
[im 21/83  brain]
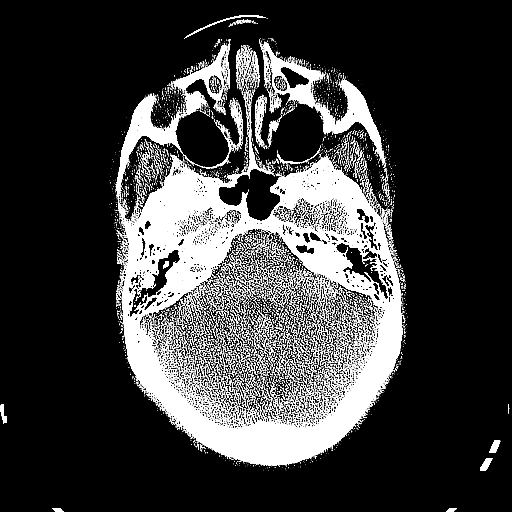
[im 29/83  brain]
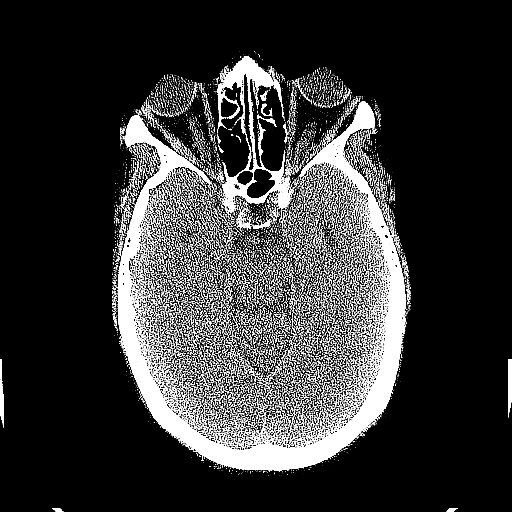
[im 37/83  brain]
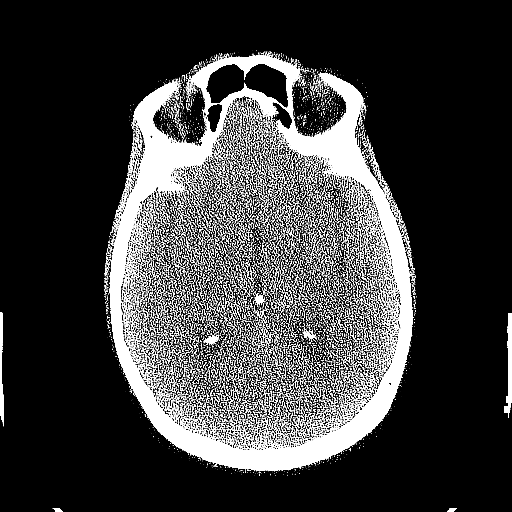
[im 37/83  bone]
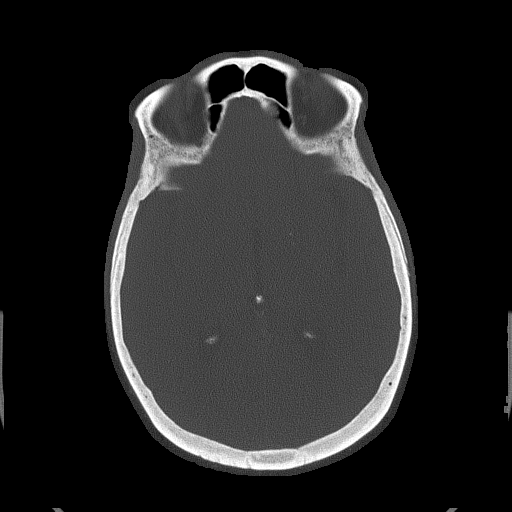
[im 46/83  brain]
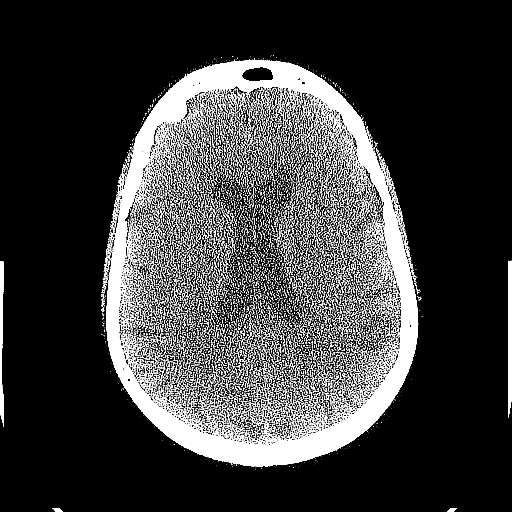
[im 54/83  brain]
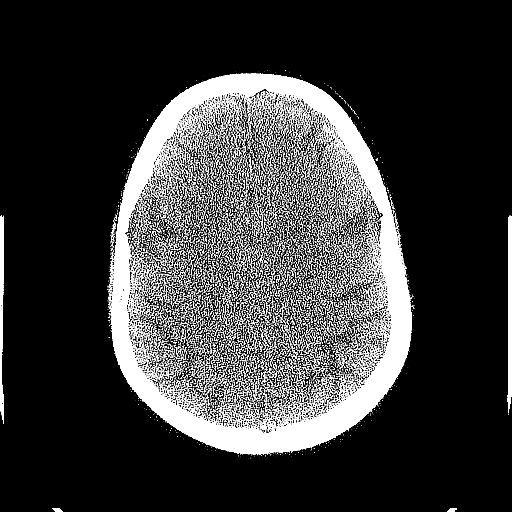
[im 62/83  brain]
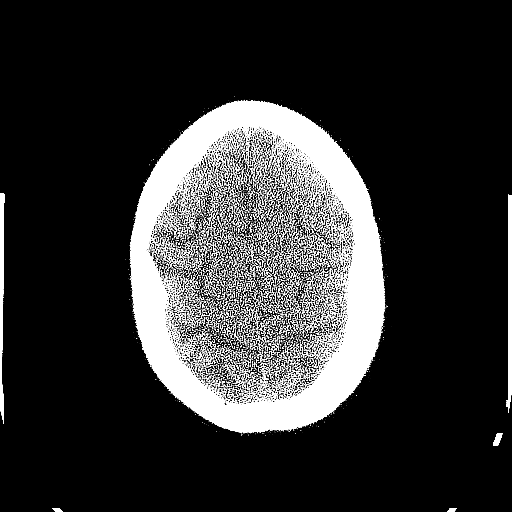
[im 70/83  brain]
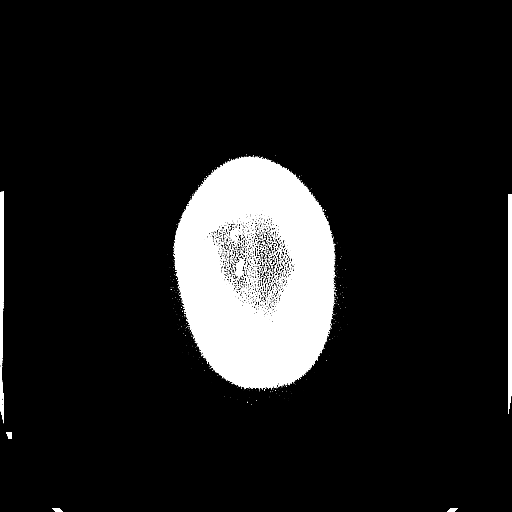
[im 70/83  bone]
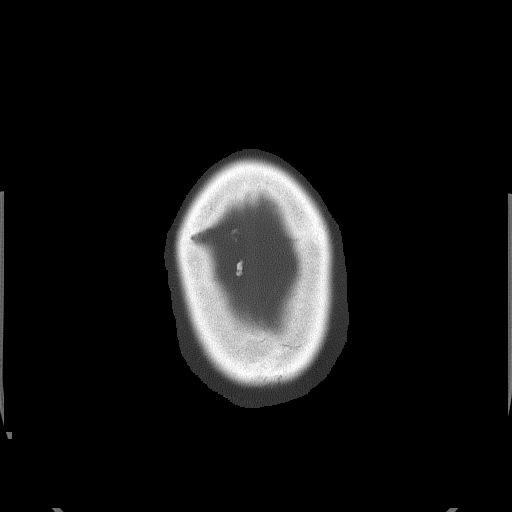
[im 78/83  brain]
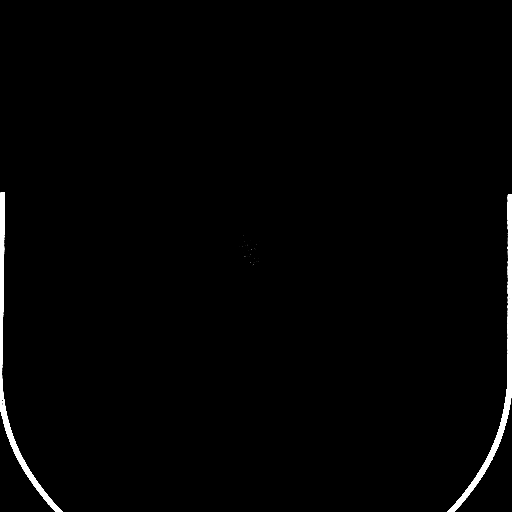

[Series 5: head 3.0 mpr cor · coronal · 0.31mm/px · 3 of 67 slices shown]
[im 23/67  brain]
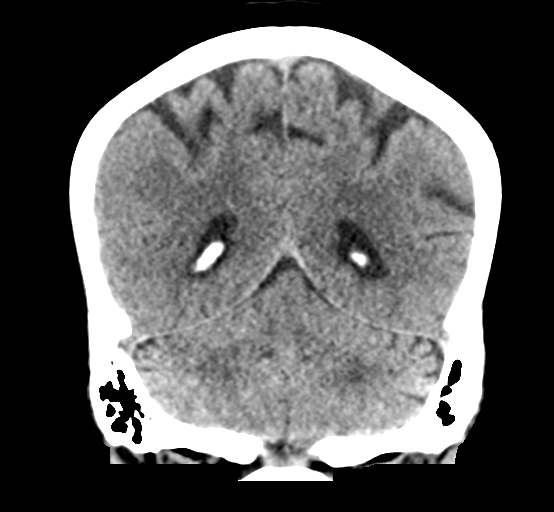
[im 30/67  brain]
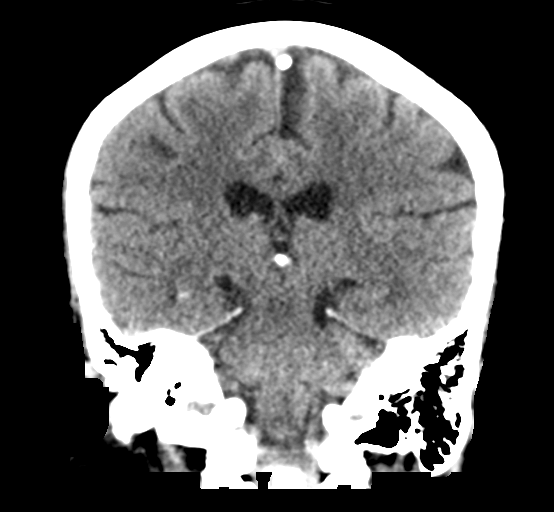
[im 37/67  brain]
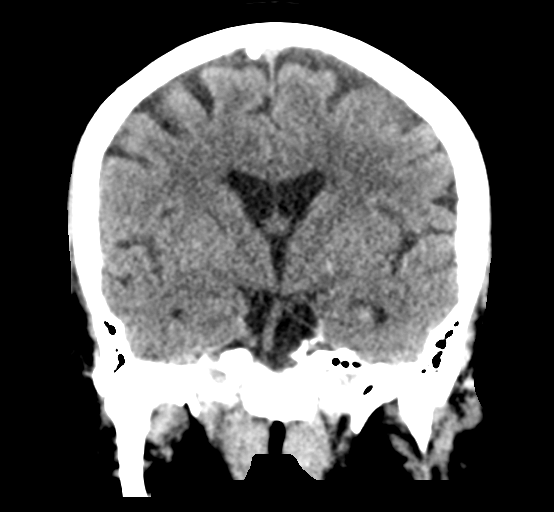

[Series 6: head 3.0 mpr sag · sagittal · 0.32mm/px · 3 of 54 slices shown]
[im 18/54  brain]
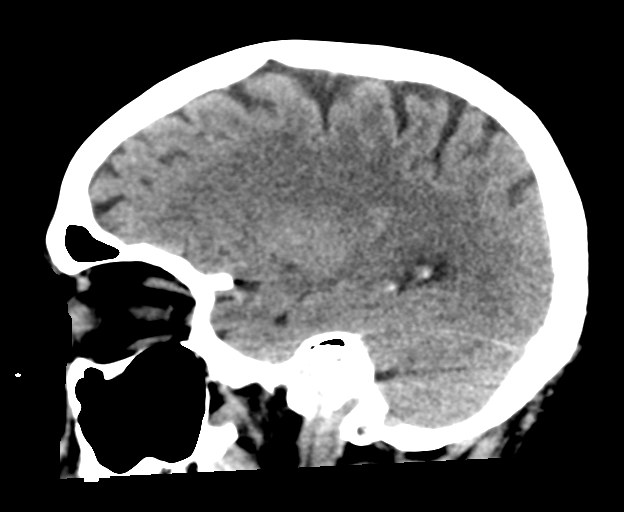
[im 27/54  brain]
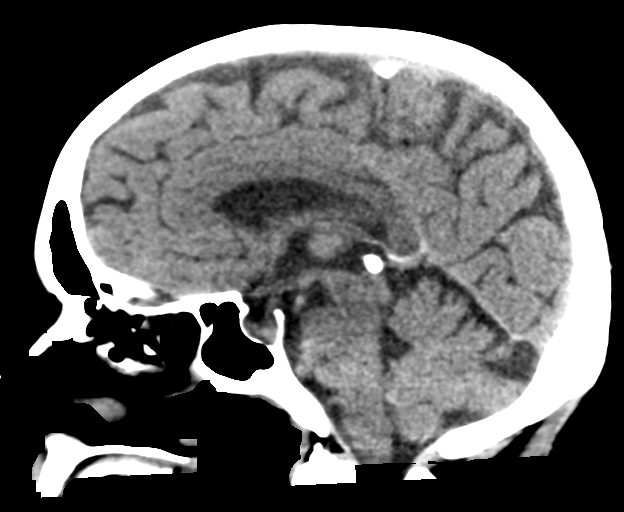
[im 36/54  brain]
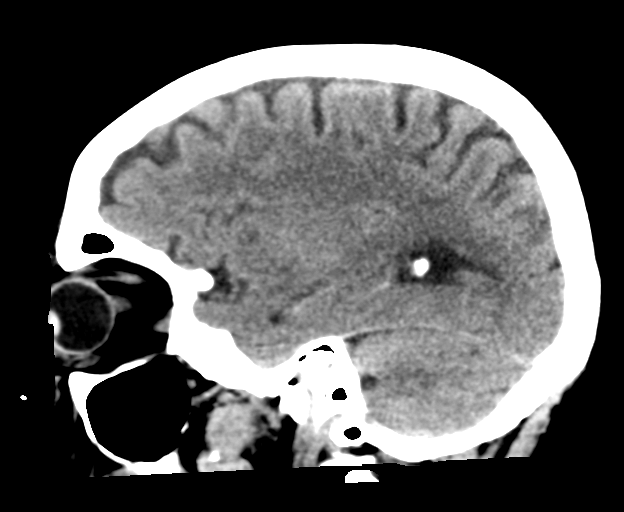

[16 of 47 positions shown; findings below may reference images not displayed]

FINDINGS: Brain: Basal ganglia calcifications are noted bilaterally. Mild
chronic white matter ischemic change is seen. No findings to suggest
acute hemorrhage, acute infarction or space-occupying mass lesion
are seen.

Vascular: No hyperdense vessel or unexpected calcification.

Skull: Normal. Negative for fracture or focal lesion.

Sinuses/Orbits: No acute finding.

Other: None.
IMPRESSION: Chronic white matter ischemic change without acute abnormality.

## 2019-08-31 MED ORDER — SODIUM CHLORIDE 0.9 % IV BOLUS
1000.0000 mL | Freq: Once | INTRAVENOUS | Status: AC
  Administered 2019-08-31: 1000 mL via INTRAVENOUS

## 2019-08-31 MED ORDER — MECLIZINE HCL 50 MG PO TABS: 50.0000 mg | ORAL_TABLET | Freq: Three times a day (TID) | ORAL | 0 refills | Status: DC | PRN

## 2019-08-31 MED ORDER — DIPHENHYDRAMINE HCL 50 MG/ML IJ SOLN
12.5000 mg | Freq: Once | INTRAMUSCULAR | Status: AC
  Administered 2019-08-31: 12.5 mg via INTRAVENOUS
  Filled 2019-08-31: qty 1

## 2019-08-31 MED ORDER — PROCHLORPERAZINE EDISYLATE 10 MG/2ML IJ SOLN
5.0000 mg | Freq: Once | INTRAMUSCULAR | Status: AC
  Administered 2019-08-31: 5 mg via INTRAVENOUS
  Filled 2019-08-31: qty 2

## 2019-08-31 NOTE — ED Provider Notes (Signed)
[facility] EMERGENCY DEPARTMENT Provider Note   CSN: [id number] Arrival date & time: 08/31/19  1920     History Chief Complaint  Patient presents with  . Dizziness  . Fatigue    Christine Morales is a 74 y.o. female.  74 yo F with a chief complaint of dizziness.  Feels a slight sensation of the room spinning.  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  Suddenly felt very dizzy and had to lay down.  Denies loss of consciousness.  Denies headache or neck pain.  Feels that the dizziness has improved but has persisted.  Just feels generally fatigued all over.  Nauseated but without vomiting.  Prior to this event the patient was eating and drinking normally denies any recent medication changes denies chest pain shortness of breath head injuries.  Denies any recent change in her diet.  Denies dark stool or blood in her stool.  Denies unilateral numbness or tingling denies difficulty with speech or swallowing.  The history is provided by the patient.  Dizziness Quality:  Head spinning Severity:  Severe Onset quality:  Sudden Duration:  2 hours Timing:  Constant Progression:  Partially resolved Chronicity:  New Context comment:  Walking Relieved by:  Nothing Worsened by:  Nothing Ineffective treatments:  None tried Associated symptoms: no chest pain, no headaches, no nausea, no palpitations, no shortness of breath and no vomiting        Past Medical History:  Diagnosis Date  . Anemia    iron def  . Helicobacter pylori ab+   . Hemorrhoids   . Hypertension   . nhl dx'd 12/2009   chemo comp 2011  . 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 Rhinitis, allergic   . Ulcer    peptic    Patient Active Problem List   Diagnosis Date Noted  . Foot pain, left 12/23/2018  . Vertigo 10/21/2017  . Morbid obesity ([place]) 10/21/2017  . Acute bronchitis 07/05/2015  . Traumatic hematoma of foot 02/12/2015  .  Hypothyroidism 09/13/2013  . Abdominal pain, left lower quadrant 09/07/2012  . Well adult exam 03/08/2012  . CRF (chronic renal failure) 09/03/2011  . B12 deficiency 11/22/2010  . THRUSH 03/14/2010  . 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[place]) 01/07/2010  . ANEMIA-IRON DEFICIENCY 12/28/2009  . HEMORRHOIDS 12/28/2009  . HELICOBACTER PYLORI INFECTION 11/23/2009  . NAUSEA 11/23/2009  . BACK PAIN, ACUTE 12/21/2008  . OTHER SYMPTOMS INVOLVING DIGESTIVE SYSTEM OTHER 06/19/2008  . SWEATING 03/24/2008  . Flank pain 03/24/2008  . Pain in limb 01/24/2008  . Essential hypertension 12/01/2007  . ALLERGIC RHINITIS 12/01/2007  . Blood in stool 12/01/2007  . REDUCTION MAMMOPLASTY, HX OF 12/01/2007    Past Surgical History:  Procedure Laterality Date  . ABDOMINAL HYSTERECTOMY    . BREAST REDUCTION SURGERY    . IR REMOVAL TUN ACCESS W/ PORT W/O FL MOD SED  12/15/2017  . PARTIAL COLECTOMY     dr blackmon  . TUBAL LIGATION       OB History    Gravida  2   Para  2   Term      Preterm      AB      Living  2     SAB      TAB      Ectopic      Multiple      Live Births  Family History  Problem Relation Age of Onset  . Cancer Father   . Heart disease Sister   . Diabetes Brother   . Hypertension Other        FH  . Cholelithiasis Neg Hx     Social History   Tobacco Use  . Smoking status: Never Smoker  . Smokeless tobacco: Never Used  Substance Use Topics  . Alcohol use: No    Alcohol/week: 0.0 standard drinks  . Drug use: No    Home Medications Prior to Admission medications   Medication Sig Start Date End Date Taking? Authorizing Provider  allopurinol (ZYLOPRIM) 100 MG tablet Take 0.5 tablets (50 mg total) by mouth daily. 12/27/18   Plotnikov, Evie Lacks, MD  amLODipine (NORVASC) 5 MG tablet TAKE 1 TABLET BY MOUTH EVERY DAY 09/19/18   Plotnikov, Evie Lacks, MD  Aspirin-Salicylamide-Caffeine (BC HEADACHE PO) Take 1 packet by mouth daily as needed  (headache).    [provider]  Cholecalciferol (VITAMIN D3) 1000 UNITS tablet Take 1,000 Units by mouth daily.      [provider]  colchicine 0.6 MG tablet Take two tablets prn gout attack.Then take another one in 1-2 hrs. Do not repeat for 3 days. 12/27/18   Plotnikov, Evie Lacks, MD  Cyanocobalamin (VITAMIN B 12 PO) Take 1,000 mcg by mouth daily.    [provider]  ferrous sulfate (FERRO-BOB) 325 (65 FE) MG tablet Take 325 mg by mouth daily with breakfast.      [provider]  levothyroxine (SYNTHROID) 100 MCG tablet TAKE 1 TABLET BY MOUTH EVERY DAY 03/15/19   Plotnikov, Evie Lacks, MD  losartan (COZAAR) 100 MG tablet TAKE 1 TABLET BY MOUTH EVERY DAY 12/16/18   Plotnikov, Evie Lacks, MD  meclizine (ANTIVERT) 50 MG tablet Take 1 tablet (50 mg total) by mouth 3 (three) times daily as needed. 08/31/19   Deno Etienne, DO    Allergies    Patient has no known allergies.  Review of Systems   Review of Systems  Constitutional: Negative for chills and fever.  HENT: Negative for congestion and rhinorrhea.   Eyes: Negative for redness and visual disturbance.  Respiratory: Negative for shortness of breath and wheezing.   Cardiovascular: Negative for chest pain and palpitations.  Gastrointestinal: Negative for nausea and vomiting.  Genitourinary: Negative for dysuria and urgency.  Musculoskeletal: Negative for arthralgias and myalgias.  Skin: Negative for pallor and wound.  Neurological: Positive for dizziness. Negative for headaches.    Physical Exam Updated Vital Signs BP 124/68   Pulse 65   Temp (!) 97.4 F (36.3 C)   Resp (!) 25   Ht 5\' 7"  (1.702 m)   Wt 104.3 kg   SpO2 99%   BMI 36.02 kg/m   Physical Exam Vitals and nursing note reviewed.  Constitutional:      General: She is not in acute distress.    Appearance: She is well-developed. She is not diaphoretic.  HENT:     Head: Normocephalic and atraumatic.  Eyes:     Pupils: Pupils are equal,  round, and reactive to light.  Cardiovascular:     Rate and Rhythm: Normal rate and regular rhythm.     Heart sounds: No murmur. No friction rub. No gallop.   Pulmonary:     Effort: Pulmonary effort is normal.     Breath sounds: No wheezing or rales.  Abdominal:     General: There is no distension.     Palpations: Abdomen is  soft.     Tenderness: There is no abdominal tenderness.  Musculoskeletal:        General: No tenderness.     Cervical back: Normal range of motion and neck supple.  Skin:    General: Skin is warm and dry.  Neurological:     Mental Status: She is alert and oriented to person, place, and time.     Comments: Diffuse weakness. Mild trembling of upper and lower extremities.   Psychiatric:        Behavior: Behavior normal.     ED Results / Procedures / Treatments   Labs (all labs ordered are listed, but only abnormal results are displayed) Labs Reviewed  BASIC METABOLIC PANEL - Abnormal; Notable for the following components:      Result Value   Potassium 5.6 (*)    CO2 21 (*)    Glucose, Bld 182 (*)    Creatinine, Ser 1.93 (*)    GFR calc non Af Amer 26 (*)    GFR calc Af Amer 30 (*)    All other components within normal limits  MAGNESIUM - Abnormal; Notable for the following components:   Magnesium 2.5 (*)    All other components within normal limits  CBC WITH DIFFERENTIAL/PLATELET  TROPONIN I (HIGH SENSITIVITY)    EKG EKG Interpretation  Date/Time:  Wednesday August 31 2019 19:24:30 EST Ventricular Rate:  63 PR Interval:    QRS Duration: 81 QT Interval:  437 QTC Calculation: 448 R Axis:   4 Text Interpretation: Sinus rhythm Low voltage, precordial leads Borderline repolarization abnormality Borderline ST elevation, lateral leads st depression in lead III seen on prior Otherwise no significant change Confirmed by Deno Etienne [phone]) on 08/31/2019 8:01:54 PM   Radiology CT Head Wo Contrast  Result Date: 08/31/2019 CLINICAL DATA:  Dizziness and  weakness EXAM: CT HEAD WITHOUT CONTRAST TECHNIQUE: Contiguous axial images were obtained from the base of the skull through the vertex without intravenous contrast. COMPARISON:  None. FINDINGS: Brain: Basal ganglia calcifications are noted bilaterally. Mild chronic white matter ischemic change is seen. No findings to suggest acute hemorrhage, acute infarction or space-occupying mass lesion are seen. Vascular: No hyperdense vessel or unexpected calcification. Skull: Normal. Negative for fracture or focal lesion. Sinuses/Orbits: No acute finding. Other: None. IMPRESSION: Chronic white matter ischemic change without acute abnormality. Electronically Signed   By: Inez Catalina M.D.   On: 08/31/2019 22:04    Procedures Procedures (including critical care time)  Medications Ordered in ED Medications  sodium chloride 0.9 % bolus 1,000 mL (1,000 mLs Intravenous New Bag/Given 08/31/19 2047)  prochlorperazine (COMPAZINE) injection 5 mg (5 mg Intravenous Given 08/31/19 2048)  diphenhydrAMINE (BENADRYL) injection 12.5 mg (12.5 mg Intravenous Given 08/31/19 2048)    ED Course  I have reviewed the triage vital signs and the nursing notes.  Pertinent labs & imaging results that were available during my care of the patient were reviewed by me and considered in my medical decision making (see chart for details).    MDM Rules/Calculators/A&P                      74 yo F with a chief complaint of dizziness.  Sounds like vertigo by history.  She however looks very unwell and I am unable to reproduce her dizziness with head movement.  Concern for possible stroke will do a trial of a headache cocktail as well as meclizine bolus of fluids lab work and reassess.  Labwork  with mild hyperkalemia, renal function at baseline.  No significant other electrolyte issue, no anemia.  CT head negative for acute process.  Patient feeling much better on reassessment.  Will attempt to ambulate.   Ambulated without issue.   Requesting d/c home.   10:17 PM:  I have discussed the diagnosis/risks/treatment options with the patient and believe the pt to be eligible for discharge home to follow-up with PCP. We also discussed returning to the ED immediately if new or worsening sx occur. We discussed the sx which are most concerning (e.g., sudden worsening pain, fever, inability to tolerate by mouth) that necessitate immediate return. Medications administered to the patient during their visit and any new prescriptions provided to the patient are listed below.  Medications given during this visit Medications  sodium chloride 0.9 % bolus 1,000 mL (1,000 mLs Intravenous New Bag/Given 08/31/19 2047)  prochlorperazine (COMPAZINE) injection 5 mg (5 mg Intravenous Given 08/31/19 2048)  diphenhydrAMINE (BENADRYL) injection 12.5 mg (12.5 mg Intravenous Given 08/31/19 2048)     The patient appears reasonably screen and/or stabilized for discharge and I doubt any other medical condition or other [facility] requiring further screening, evaluation, or treatment in the ED at this time prior to discharge.    Final Clinical Impression(s) / ED Diagnoses Final diagnoses:  Vertigo    Rx / DC Orders ED Discharge Orders         Ordered    meclizine (ANTIVERT) 50 MG tablet  3 times daily PRN     08/31/19 2214           Deno Etienne, DO 08/31/19 2217

## 2019-08-31 NOTE — Discharge Instructions (Signed)
Follow up with your family doc.  Please return for worsening symptoms, especially one sided numbness, weakness, difficulty with speech or swallowing.

## 2019-08-31 NOTE — ED Triage Notes (Signed)
Pt BIB [facility] with sudden onset generalized weakness, dizziness and nausea. EMS reports patient states the dizziness was relieved when she closed her eyes. Pt arrives to the ED alert and oriented x4 denying any pain does report some nausea. Pt reports she did have an episode of vertigo about a year ago but it felt a little different than this time.

## 2019-08-31 NOTE — ED Notes (Signed)
Ambulated pt in hallway, had zero complaints, zero issues walking, no SOB, no dizziness, no issues with exertion, "felt good", stopped in bathroom on way back.

## 2019-08-31 NOTE — ED Notes (Signed)
Pt back from CT without incidence 

## 2019-08-31 NOTE — ED Notes (Signed)
Patient verbalizes understanding of discharge instructions and follow up care. Opportunity for questioning and answers were provided. All questions answered completely. PIV removed, catheter intact, site dressed with gauze and tape. Armband removed by staff, pt discharged from ED. Wheeled from ED with all belongings and picked up by son

## 2019-08-31 NOTE — ED Notes (Signed)
Pt taken to CT.

## 2019-08-31 NOTE — ED Notes (Signed)
All meds given per [facility]. Pt ambulatory to bathroom with strong, steady gait

## 2019-08-31 NOTE — ED Notes (Signed)
PIV initiated, 20G to [place]. IV flushes with 10 cc NS without s/s of infiltration. Positive blood return noted. Labs drawn, labeled with 2 pt identifiers, and sent. PIV secured with tape and tegaderm

## 2019-09-13 ENCOUNTER — Ambulatory Visit: Payer: Medicare PPO | Attending: Internal Medicine

## 2019-09-13 DIAGNOSIS — Z23 Encounter for immunization: Secondary | ICD-10-CM

## 2019-09-13 NOTE — Progress Notes (Signed)
   Covid-19 Vaccination Clinic  Name:  Christine Morales    MRN: [id number] DOB: 1945/11/26  09/13/2019  [name] was observed post Covid-19 immunization for 15 minutes without incident. She was provided with Vaccine Information Sheet and instruction to access the V-Safe system.   [name] was instructed to call 911 with any severe reactions post vaccine: Marland Kitchen Difficulty breathing  . Swelling of face and throat  . A fast heartbeat  . A bad rash all over body  . Dizziness and weakness   Immunizations Administered    Name Date Dose VIS Date Route   Pfizer COVID-19 Vaccine 09/13/2019  8:33 AM 0.3 mL 06/03/2019 Intramuscular   Manufacturer: [place]   Lot: [id number]   [place]: [id number]

## 2019-10-06 ENCOUNTER — Other Ambulatory Visit: Payer: Self-pay | Admitting: Internal Medicine

## 2019-10-12 ENCOUNTER — Ambulatory Visit: Payer: Medicare PPO | Admitting: Internal Medicine

## 2019-11-10 ENCOUNTER — Encounter: Payer: Self-pay | Admitting: Internal Medicine

## 2019-11-10 ENCOUNTER — Other Ambulatory Visit: Payer: Self-pay

## 2019-11-10 ENCOUNTER — Ambulatory Visit: Payer: Medicare PPO | Admitting: Internal Medicine

## 2019-11-10 DIAGNOSIS — E538 Deficiency of other specified B group vitamins: Secondary | ICD-10-CM | POA: Diagnosis not present

## 2019-11-10 DIAGNOSIS — M79672 Pain in left foot: Secondary | ICD-10-CM | POA: Diagnosis not present

## 2019-11-10 DIAGNOSIS — M545 Low back pain, unspecified: Secondary | ICD-10-CM

## 2019-11-10 DIAGNOSIS — R109 Unspecified abdominal pain: Secondary | ICD-10-CM | POA: Diagnosis not present

## 2019-11-10 DIAGNOSIS — I10 Essential (primary) hypertension: Secondary | ICD-10-CM | POA: Diagnosis not present

## 2019-11-10 DIAGNOSIS — M79671 Pain in right foot: Secondary | ICD-10-CM

## 2019-11-10 LAB — URINALYSIS
Bilirubin Urine: NEGATIVE
Hgb urine dipstick: NEGATIVE
Ketones, ur: NEGATIVE
Leukocytes,Ua: NEGATIVE
Nitrite: NEGATIVE
Specific Gravity, Urine: 1.02 (ref 1.000–1.030)
Total Protein, Urine: NEGATIVE
Urine Glucose: NEGATIVE
Urobilinogen, UA: 0.2 (ref 0.0–1.0)
pH: 6 (ref 5.0–8.0)

## 2019-11-10 MED ORDER — METHYLPREDNISOLONE 4 MG PO TBPK: ORAL_TABLET | ORAL | 0 refills | Status: DC

## 2019-11-10 NOTE — Assessment & Plan Note (Signed)
On B12 

## 2019-11-10 NOTE — Assessment & Plan Note (Signed)
UA

## 2019-11-10 NOTE — Assessment & Plan Note (Signed)
?  MSK UA

## 2019-11-10 NOTE — Assessment & Plan Note (Signed)
BP Readings from Last 3 Encounters:  11/10/19 124/82  08/31/19 102/88  07/18/19 130/76

## 2019-11-10 NOTE — Assessment & Plan Note (Addendum)
R LE pain - MSK. It started when her son bought her an SUV: it is high and hard to get in.  Medrol pack. No NSAIDs due to CRI

## 2019-11-10 NOTE — Progress Notes (Signed)
Subjective:  Patient ID: Christine Morales, female    DOB: 05-26-1946  Age: 74 y.o. MRN: [id number]  CC: Right sided leg pain   HPI Christine Morales presents for R hip is hurt and stiff after sitting for a long time. It started when her son bought her an SUV: it is high and hard to get in.  C/o occ LBP on the R  F/u HTN and hypothyroidism - on Rx  Outpatient Medications Prior to Visit  Medication Sig Dispense Refill  . amLODipine (NORVASC) 5 MG tablet TAKE 1 TABLET BY MOUTH EVERY DAY 90 tablet 3  . Aspirin-Salicylamide-Caffeine (BC HEADACHE PO) Take 1 packet by mouth daily as needed (headache).    . Cholecalciferol (VITAMIN D3) 1000 UNITS tablet Take 1,000 Units by mouth daily.      . Cyanocobalamin (VITAMIN B 12 PO) Take 1,000 mcg by mouth daily.    . ferrous sulfate (FERRO-BOB) 325 (65 FE) MG tablet Take 325 mg by mouth daily with breakfast.      . levothyroxine (SYNTHROID) 100 MCG tablet TAKE 1 TABLET BY MOUTH EVERY DAY 90 tablet 3  . losartan (COZAAR) 100 MG tablet TAKE 1 TABLET BY MOUTH EVERY DAY 90 tablet 3  . allopurinol (ZYLOPRIM) 100 MG tablet Take 0.5 tablets (50 mg total) by mouth daily. (Patient not taking: Reported on 11/10/2019) 45 tablet 3  . colchicine 0.6 MG tablet Take two tablets prn gout attack.Then take another one in 1-2 hrs. Do not repeat for 3 days. (Patient not taking: Reported on 11/10/2019) 18 tablet 3  . meclizine (ANTIVERT) 50 MG tablet Take 1 tablet (50 mg total) by mouth 3 (three) times daily as needed. (Patient not taking: Reported on 11/10/2019) 30 tablet 0   Facility-Administered Medications Prior to Visit  Medication Dose Route Frequency Provider Last Rate Last Admin  . heparin lock flush 100 unit/mL  500 Units Intravenous Once Shadad, Firas N, MD      . sodium chloride 0.9 % injection 10 mL  10 mL Intravenous PRN Wyatt Portela, MD      . sodium chloride 0.9 % injection 10 mL  10 mL Intravenous PRN Wyatt Portela, MD   10 mL at 05/20/17 0956    ROS: Review  of Systems  Constitutional: Negative for activity change, appetite change, chills, fatigue and unexpected weight change.  HENT: Negative for congestion, mouth sores and sinus pressure.   Eyes: Negative for visual disturbance.  Respiratory: Negative for cough and chest tightness.   Gastrointestinal: Negative for abdominal pain and nausea.  Genitourinary: Negative for difficulty urinating, frequency and vaginal pain.  Musculoskeletal: Positive for arthralgias and back pain. Negative for gait problem.  Skin: Negative for pallor and rash.  Neurological: Negative for dizziness, tremors, weakness, numbness and headaches.  Psychiatric/Behavioral: Negative for confusion, sleep disturbance and suicidal ideas.    Objective:  BP 124/82 (BP Location: Left Arm, Patient Position: Sitting, Cuff Size: Large)   Pulse 76   Temp 98.3 F (36.8 C) (Oral)   Ht 5\' 7"  (1.702 m)   Wt 226 lb 6.4 oz (102.7 kg)   SpO2 98%   BMI 35.46 kg/m   BP Readings from Last 3 Encounters:  11/10/19 124/82  08/31/19 102/88  07/18/19 130/76    Wt Readings from Last 3 Encounters:  11/10/19 226 lb 6.4 oz (102.7 kg)  08/31/19 230 lb (104.3 kg)  07/18/19 231 lb (104.8 kg)    Physical Exam Constitutional:  General: She is not in acute distress.    Appearance: She is well-developed.  HENT:     Head: Normocephalic.     Right Ear: External ear normal.     Left Ear: External ear normal.     Nose: Nose normal.  Eyes:     General:        Right eye: No discharge.        Left eye: No discharge.     Conjunctiva/sclera: Conjunctivae normal.     Pupils: Pupils are equal, round, and reactive to light.  Neck:     Thyroid: No thyromegaly.     Vascular: No JVD.     Trachea: No tracheal deviation.  Cardiovascular:     Rate and Rhythm: Normal rate and regular rhythm.     Heart sounds: Normal heart sounds.  Pulmonary:     Effort: No respiratory distress.     Breath sounds: No stridor. No wheezing.  Abdominal:      General: Bowel sounds are normal. There is no distension.     Palpations: Abdomen is soft. There is no mass.     Tenderness: There is no abdominal tenderness. There is no guarding or rebound.  Musculoskeletal:        General: Tenderness present.     Cervical back: Normal range of motion and neck supple.  Lymphadenopathy:     Cervical: No cervical adenopathy.  Skin:    Findings: No erythema or rash.  Neurological:     Cranial Nerves: No cranial nerve deficit.     Motor: No abnormal muscle tone.     Coordination: Coordination normal.     Deep Tendon Reflexes: Reflexes normal.  Psychiatric:        Behavior: Behavior normal.        Thought Content: Thought content normal.        Judgment: Judgment normal.   R lat hip is tender in the lateral area  Lab Results  Component Value Date   WBC 6.4 08/31/2019   HGB 13.0 08/31/2019   HCT 41.8 08/31/2019   PLT 242 08/31/2019   GLUCOSE 182 (H) 08/31/2019   CHOL 185 06/24/2018   TRIG 132.0 06/24/2018   HDL 49.10 06/24/2018   LDLCALC 110 (H) 06/24/2018   ALT 13 12/23/2018   AST 19 12/23/2018   NA 139 08/31/2019   K 5.6 (H) 08/31/2019   CL 107 08/31/2019   CREATININE 1.93 (H) 08/31/2019   BUN 22 08/31/2019   CO2 21 (L) 08/31/2019   TSH 1.10 07/13/2019   INR 0.94 12/15/2017    CT Head Wo Contrast  Result Date: 08/31/2019 CLINICAL DATA:  Dizziness and weakness EXAM: CT HEAD WITHOUT CONTRAST TECHNIQUE: Contiguous axial images were obtained from the base of the skull through the vertex without intravenous contrast. COMPARISON:  None. FINDINGS: Brain: Basal ganglia calcifications are noted bilaterally. Mild chronic white matter ischemic change is seen. No findings to suggest acute hemorrhage, acute infarction or space-occupying mass lesion are seen. Vascular: No hyperdense vessel or unexpected calcification. Skull: Normal. Negative for fracture or focal lesion. Sinuses/Orbits: No acute finding. Other: None. IMPRESSION: Chronic white matter  ischemic change without acute abnormality. Electronically Signed   By: Inez Catalina M.D.   On: 08/31/2019 22:04    Assessment & Plan:   There are no diagnoses linked to this encounter.   No orders of the defined types were placed in this encounter.    Follow-up: No follow-ups on file.  Walker Kehr, MD

## 2019-11-25 DIAGNOSIS — M1712 Unilateral primary osteoarthritis, left knee: Secondary | ICD-10-CM | POA: Diagnosis not present

## 2019-12-20 ENCOUNTER — Other Ambulatory Visit: Payer: Self-pay

## 2019-12-21 ENCOUNTER — Encounter: Payer: Self-pay | Admitting: Nurse Practitioner

## 2019-12-21 ENCOUNTER — Ambulatory Visit: Payer: Medicare PPO | Admitting: Nurse Practitioner

## 2019-12-21 VITALS — BP 120/78 | Ht 66.0 in | Wt 223.4 lb

## 2019-12-21 DIAGNOSIS — Z90710 Acquired absence of both cervix and uterus: Secondary | ICD-10-CM

## 2019-12-21 DIAGNOSIS — Z01419 Encounter for gynecological examination (general) (routine) without abnormal findings: Secondary | ICD-10-CM | POA: Diagnosis not present

## 2019-12-21 NOTE — Progress Notes (Signed)
   Christine Morales 1945/06/27 [id number]   History:  74 y.o. G2 P2 presents for breast and pelvic exam without GYN complaints. 2008 TAH for fibroids no HRT. Normal pap and mammogram history. Primary care manages hypothyroidism, HTN. 2011 diagnosed with non-hodgkin's lymphoma.   Gynecologic History No LMP recorded. Patient has had a hysterectomy.   Contraception: status post hysterectomy Last Pap: no longer screening for guidelines and patient request Last mammogram: 04/13/2019. Results were: normal Last colonoscopy: 01/15/2007. Results were: normal Last Dexa: 2016. Results were: normal  Past medical history, past surgical history, family history and social history were all reviewed and documented in the EPIC chart.  ROS:  A ROS was performed and pertinent positives and negatives are included.  Exam:  Vitals:   12/21/19 0932  BP: 120/78  Weight: 223 lb 6.4 oz (101.3 kg)  Height: 5\' 6"  (1.676 m)   Body mass index is 36.06 kg/m.  General appearance:  Normal Thyroid:  Symmetrical, normal in size, without palpable masses or nodularity. Respiratory  Auscultation:  Clear without wheezing or rhonchi Cardiovascular  Auscultation:  Regular rate, without rubs, murmurs or gallops  Edema/varicosities:  Not grossly evident Abdominal  Soft,nontender, without masses, guarding or rebound.  Liver/spleen:  No organomegaly noted  Hernia:  None appreciated  Skin  Inspection:  Grossly normal   Breasts: Examined lying and sitting. Bilateral breast reduction scars  Right: Without masses, retractions, discharge or axillary adenopathy.   Left: Without masses, retractions, discharge or axillary adenopathy. Gentitourinary   Inguinal/mons:  Normal without inguinal adenopathy  External genitalia:  Normal  BUS/Urethra/Skene's glands:  Normal  Vagina:  Normal, atrophic changes  Cervix:  Absent  Uterus:  Absent  Adnexa/parametria:     Rt: Without masses or tenderness.   Lt: Without masses or  tenderness.  Anus and perineum: Normal, non-bleeding hemorrhoid  Digital rectal exam: Normal sphincter tone without palpated masses or tenderness  Assessment/Plan:  74 y.o. G2 P2 for annual exam.   Well female exam with routine gynecological exam - Education provided on SBEs, importance of preventative screenings, current guidelines, high calcium diet, regular exercise, and multivitamin daily. Labs done elsewhere.   History of total abdominal hysterectomy - due to fibroids. No HRT  Screening for colon cancer - overdue for colonoscopy. Normal in 2008. Will call to schedule this soon.  Screening for osteoporosis - PCP manages this. DEXA 2016 normal. Recommend daily exercise that includes weightbearing exercises, daily vitamin D supplement.   Follow up in 1 year for annual      [place], 9:42 AM 12/21/2019

## 2019-12-21 NOTE — Patient Instructions (Signed)
Health Maintenance After Age 72 After age 72, you are at a higher risk for certain long-term diseases and infections as well as injuries from falls. Falls are a major cause of broken bones and head injuries in people who are older than age 72. Getting regular preventive care can help to keep you healthy and well. Preventive care includes getting regular testing and making lifestyle changes as recommended by your health care provider. Talk with your health care provider about:  Which screenings and tests you should have. A screening is a test that checks for a disease when you have no symptoms.  A diet and exercise plan that is right for you. What should I know about screenings and tests to prevent falls? Screening and testing are the best ways to find a health problem early. Early diagnosis and treatment give you the best chance of managing medical conditions that are common after age 72. Certain conditions and lifestyle choices may make you more likely to have a fall. Your health care provider may recommend:  Regular vision checks. Poor vision and conditions such as cataracts can make you more likely to have a fall. If you wear glasses, make sure to get your prescription updated if your vision changes.  Medicine review. Work with your health care provider to regularly review all of the medicines you are taking, including over-the-counter medicines. Ask your health care provider about any side effects that may make you more likely to have a fall. Tell your health care provider if any medicines that you take make you feel dizzy or sleepy.  Osteoporosis screening. Osteoporosis is a condition that causes the bones to get weaker. This can make the bones weak and cause them to break more easily.  Blood pressure screening. Blood pressure changes and medicines to control blood pressure can make you feel dizzy.  Strength and balance checks. Your health care provider may recommend certain tests to check your  strength and balance while standing, walking, or changing positions.  Foot health exam. Foot pain and numbness, as well as not wearing proper footwear, can make you more likely to have a fall.  Depression screening. You may be more likely to have a fall if you have a fear of falling, feel emotionally low, or feel unable to do activities that you used to do.  Alcohol use screening. Using too much alcohol can affect your balance and may make you more likely to have a fall. What actions can I take to lower my risk of falls? General instructions  Talk with your health care provider about your risks for falling. Tell your health care provider if: ? You fall. Be sure to tell your health care provider about all falls, even ones that seem minor. ? You feel dizzy, sleepy, or off-balance.  Take over-the-counter and prescription medicines only as told by your health care provider. These include any supplements.  Eat a healthy diet and maintain a healthy weight. A healthy diet includes low-fat dairy products, low-fat (lean) meats, and fiber from whole grains, beans, and lots of fruits and vegetables. Home safety  Remove any tripping hazards, such as rugs, cords, and clutter.  Install safety equipment such as grab bars in bathrooms and safety rails on stairs.  Keep rooms and walkways well-lit. Activity   Follow a regular exercise program to stay fit. This will help you maintain your balance. Ask your health care provider what types of exercise are appropriate for you.  If you need a cane or   walker, use it as recommended by your health care provider.  Wear supportive shoes that have nonskid soles. Lifestyle  Do not drink alcohol if your health care provider tells you not to drink.  If you drink alcohol, limit how much you have: ? 0-1 drink a day for women. ? 0-2 drinks a day for men.  Be aware of how much alcohol is in your drink. In the [place] one drink equals one typical bottle of beer (12  oz), one-half glass of wine (5 oz), or one shot of hard liquor (1 oz).  Do not use any products that contain nicotine or tobacco, such as cigarettes and e-cigarettes. If you need help quitting, ask your health care provider. Summary  Having a healthy lifestyle and getting preventive care can help to protect your health and wellness after age 72.  Screening and testing are the best way to find a health problem early and help you avoid having a fall. Early diagnosis and treatment give you the best chance for managing medical conditions that are more common for people who are older than age 72.  Falls are a major cause of broken bones and head injuries in people who are older than age 72. Take precautions to prevent a fall at home.  Work with your health care provider to learn what changes you can make to improve your health and wellness and to prevent falls. This information is not intended to replace advice given to you by your health care provider. Make sure you discuss any questions you have with your health care provider. Document Revised: 09/30/2018 Document Reviewed: 04/22/2017 Elsevier Patient Education  2020 [employer]  

## 2020-01-16 ENCOUNTER — Ambulatory Visit (INDEPENDENT_AMBULATORY_CARE_PROVIDER_SITE_OTHER): Payer: Medicare PPO | Admitting: Internal Medicine

## 2020-01-16 ENCOUNTER — Encounter: Payer: Self-pay | Admitting: Internal Medicine

## 2020-01-16 ENCOUNTER — Other Ambulatory Visit: Payer: Self-pay

## 2020-01-16 VITALS — BP 102/60 | HR 69 | Temp 98.0°F | Ht 66.0 in | Wt 223.0 lb

## 2020-01-16 DIAGNOSIS — R7309 Other abnormal glucose: Secondary | ICD-10-CM | POA: Insufficient documentation

## 2020-01-16 DIAGNOSIS — Z0000 Encounter for general adult medical examination without abnormal findings: Secondary | ICD-10-CM

## 2020-01-16 DIAGNOSIS — Z1211 Encounter for screening for malignant neoplasm of colon: Secondary | ICD-10-CM | POA: Diagnosis not present

## 2020-01-16 DIAGNOSIS — N1832 Chronic kidney disease, stage 3b: Secondary | ICD-10-CM

## 2020-01-16 DIAGNOSIS — C8233 Follicular lymphoma grade IIIa, intra-abdominal lymph nodes: Secondary | ICD-10-CM

## 2020-01-16 DIAGNOSIS — E039 Hypothyroidism, unspecified: Secondary | ICD-10-CM

## 2020-01-16 DIAGNOSIS — E538 Deficiency of other specified B group vitamins: Secondary | ICD-10-CM | POA: Diagnosis not present

## 2020-01-16 DIAGNOSIS — I10 Essential (primary) hypertension: Secondary | ICD-10-CM | POA: Diagnosis not present

## 2020-01-16 LAB — COMPLETE METABOLIC PANEL WITH GFR
AG Ratio: 1.9 (calc) (ref 1.0–2.5)
ALT: 17 U/L (ref 6–29)
AST: 21 U/L (ref 10–35)
Albumin: 4.3 g/dL (ref 3.6–5.1)
Alkaline phosphatase (APISO): 65 U/L (ref 37–153)
BUN/Creatinine Ratio: 13 (calc) (ref 6–22)
BUN: 24 mg/dL (ref 7–25)
CO2: 25 mmol/L (ref 20–32)
Calcium: 9.6 mg/dL (ref 8.6–10.4)
Chloride: 107 mmol/L (ref 98–110)
Creat: 1.9 mg/dL — ABNORMAL HIGH (ref 0.60–0.93)
GFR, Est African American: 30 mL/min/{1.73_m2} — ABNORMAL LOW (ref >=60)
GFR, Est Non African American: 26 mL/min/{1.73_m2} — ABNORMAL LOW (ref >=60)
Globulin: 2.3 g/dL (calc) (ref 1.9–3.7)
Glucose, Bld: 90 mg/dL (ref 65–99)
Potassium: 4.5 mmol/L (ref 3.5–5.3)
Sodium: 141 mmol/L (ref 135–146)
Total Bilirubin: 0.6 mg/dL (ref 0.2–1.2)
Total Protein: 6.6 g/dL (ref 6.1–8.1)

## 2020-01-16 MED ORDER — LOSARTAN POTASSIUM 100 MG PO TABS: 100.0000 mg | ORAL_TABLET | Freq: Every day | ORAL | 3 refills | Status: DC

## 2020-01-16 MED ORDER — LEVOTHYROXINE SODIUM 100 MCG PO TABS: ORAL_TABLET | ORAL | 3 refills | Status: DC

## 2020-01-16 NOTE — Progress Notes (Signed)
Subjective:  Patient ID: Christine Morales, female    DOB: May 27, 1946  Age: 74 y.o. MRN: [id number]  CC: No chief complaint on file.   HPI Christine Morales presents for a well exam  F/u HTN, DM  Outpatient Medications Prior to Visit  Medication Sig Dispense Refill  . amLODipine (NORVASC) 5 MG tablet TAKE 1 TABLET BY MOUTH EVERY DAY 90 tablet 3  . Aspirin-Salicylamide-Caffeine (BC HEADACHE PO) Take 1 packet by mouth daily as needed (headache).     . Cholecalciferol (VITAMIN D3) 1000 UNITS tablet Take 1,000 Units by mouth daily.      . Cyanocobalamin (VITAMIN B 12 PO) Take 1,000 mcg by mouth daily.    . ferrous sulfate (FERRO-BOB) 325 (65 FE) MG tablet Take 325 mg by mouth daily with breakfast.      . levothyroxine (SYNTHROID) 100 MCG tablet TAKE 1 TABLET BY MOUTH EVERY DAY 90 tablet 3  . losartan (COZAAR) 100 MG tablet TAKE 1 TABLET BY MOUTH EVERY DAY 90 tablet 3  . allopurinol (ZYLOPRIM) 100 MG tablet Take 0.5 tablets (50 mg total) by mouth daily. (Patient not taking: Reported on 01/16/2020) 45 tablet 3  . colchicine 0.6 MG tablet Take two tablets prn gout attack.Then take another one in 1-2 hrs. Do not repeat for 3 days. (Patient not taking: Reported on 01/16/2020) 18 tablet 3  . meclizine (ANTIVERT) 50 MG tablet Take 1 tablet (50 mg total) by mouth 3 (three) times daily as needed. (Patient not taking: Reported on 01/16/2020) 30 tablet 0  . methylPREDNISolone (MEDROL DOSEPAK) 4 MG TBPK tablet As directed (Patient not taking: Reported on 01/16/2020) 21 tablet 0   Facility-Administered Medications Prior to Visit  Medication Dose Route Frequency Provider Last Rate Last Admin  . heparin lock flush 100 unit/mL  500 Units Intravenous Once Shadad, Firas N, MD      . sodium chloride 0.9 % injection 10 mL  10 mL Intravenous PRN Wyatt Portela, MD      . sodium chloride 0.9 % injection 10 mL  10 mL Intravenous PRN Wyatt Portela, MD   10 mL at 05/20/17 0956    ROS: Review of Systems  Constitutional:  Negative for activity change, appetite change, chills, fatigue and unexpected weight change.  HENT: Negative for congestion, mouth sores and sinus pressure.   Eyes: Negative for visual disturbance.  Respiratory: Negative for cough and chest tightness.   Gastrointestinal: Negative for abdominal pain and nausea.  Genitourinary: Negative for difficulty urinating, frequency and vaginal pain.  Musculoskeletal: Negative for back pain and gait problem.  Skin: Negative for pallor and rash.  Neurological: Negative for dizziness, tremors, weakness, numbness and headaches.  Psychiatric/Behavioral: Negative for confusion and sleep disturbance.    Objective:  BP (!) 102/60 (BP Location: Left Arm, Patient Position: Sitting, Cuff Size: Large)   Pulse 69   Temp 98 F (36.7 C) (Oral)   Ht 5\' 6"  (1.676 m)   Wt (!) 223 lb (101.2 kg)   SpO2 98%   BMI 35.99 kg/m   BP Readings from Last 3 Encounters:  01/16/20 (!) 102/60  12/21/19 120/78  11/10/19 124/82    Wt Readings from Last 3 Encounters:  01/16/20 (!) 223 lb (101.2 kg)  12/21/19 223 lb 6.4 oz (101.3 kg)  11/10/19 226 lb 6.4 oz (102.7 kg)    Physical Exam Constitutional:      General: She is not in acute distress.    Appearance: She is well-developed. She is  obese.  HENT:     Head: Normocephalic.     Right Ear: External ear normal.     Left Ear: External ear normal.     Nose: Nose normal.  Eyes:     General:        Right eye: No discharge.        Left eye: No discharge.     Conjunctiva/sclera: Conjunctivae normal.     Pupils: Pupils are equal, round, and reactive to light.  Neck:     Thyroid: No thyromegaly.     Vascular: No JVD.     Trachea: No tracheal deviation.  Cardiovascular:     Rate and Rhythm: Normal rate and regular rhythm.     Heart sounds: Normal heart sounds.  Pulmonary:     Effort: No respiratory distress.     Breath sounds: No stridor. No wheezing.  Abdominal:     General: Bowel sounds are normal. There is no  distension.     Palpations: Abdomen is soft. There is no mass.     Tenderness: There is no abdominal tenderness. There is no guarding or rebound.  Musculoskeletal:        General: No tenderness.     Cervical back: Normal range of motion and neck supple.  Lymphadenopathy:     Cervical: No cervical adenopathy.  Skin:    Findings: No erythema or rash.  Neurological:     Mental Status: She is oriented to person, place, and time.     Cranial Nerves: No cranial nerve deficit.     Motor: No abnormal muscle tone.     Coordination: Coordination normal.     Deep Tendon Reflexes: Reflexes normal.  Psychiatric:        Behavior: Behavior normal.        Thought Content: Thought content normal.        Judgment: Judgment normal.     Lab Results  Component Value Date   WBC 6.4 08/31/2019   HGB 13.0 08/31/2019   HCT 41.8 08/31/2019   PLT 242 08/31/2019   GLUCOSE 182 (H) 08/31/2019   CHOL 185 06/24/2018   TRIG 132.0 06/24/2018   HDL 49.10 06/24/2018   LDLCALC 110 (H) 06/24/2018   ALT 13 12/23/2018   AST 19 12/23/2018   NA 139 08/31/2019   K 5.6 (H) 08/31/2019   CL 107 08/31/2019   CREATININE 1.93 (H) 08/31/2019   BUN 22 08/31/2019   CO2 21 (L) 08/31/2019   TSH 1.10 07/13/2019   INR 0.94 12/15/2017    CT Head Wo Contrast  Result Date: 08/31/2019 CLINICAL DATA:  Dizziness and weakness EXAM: CT HEAD WITHOUT CONTRAST TECHNIQUE: Contiguous axial images were obtained from the base of the skull through the vertex without intravenous contrast. COMPARISON:  None. FINDINGS: Brain: Basal ganglia calcifications are noted bilaterally. Mild chronic white matter ischemic change is seen. No findings to suggest acute hemorrhage, acute infarction or space-occupying mass lesion are seen. Vascular: No hyperdense vessel or unexpected calcification. Skull: Normal. Negative for fracture or focal lesion. Sinuses/Orbits: No acute finding. Other: None. IMPRESSION: Chronic white matter ischemic change without  acute abnormality. Electronically Signed   By: Inez Catalina M.D.   On: 08/31/2019 22:04    Assessment & Plan:    Walker Kehr, MD

## 2020-01-16 NOTE — Assessment & Plan Note (Signed)
Not seeing Oncology

## 2020-01-16 NOTE — Assessment & Plan Note (Signed)
Labs

## 2020-01-16 NOTE — Patient Instructions (Signed)

## 2020-01-16 NOTE — Assessment & Plan Note (Signed)
Low BP today  - monitor BP

## 2020-01-16 NOTE — Assessment & Plan Note (Addendum)
  We discussed age appropriate health related issues, including available/recomended screening tests and vaccinations. Labs were ordered to be later reviewed . All questions were answered. We discussed one or more of the following - seat belt use, use of sunscreen/sun exposure exercise, safe sex, fall risk reduction, second hand smoke exposure, firearm use and storage, seat belt use, a need for adhering to healthy diet and exercise. Labs were ordered. All questions were answered. GYN, BDS, mammo - per Dr Juleen China, GYN Last colon 2008 - Dr Henrene Pastor. Ref to GI A cardiac CT scan for calcium scoring offered

## 2020-01-16 NOTE — Assessment & Plan Note (Signed)
A1c

## 2020-01-16 NOTE — Assessment & Plan Note (Signed)
On B12 

## 2020-01-17 ENCOUNTER — Encounter: Payer: Self-pay | Admitting: Internal Medicine

## 2020-01-17 DIAGNOSIS — E785 Hyperlipidemia, unspecified: Secondary | ICD-10-CM | POA: Insufficient documentation

## 2020-01-17 LAB — CBC WITH DIFFERENTIAL/PLATELET
Absolute Monocytes: 470 cells/uL (ref 200–950)
Basophils Absolute: 28 cells/uL (ref 0–200)
Basophils Relative: 0.5 %
Eosinophils Absolute: 157 cells/uL (ref 15–500)
Eosinophils Relative: 2.8 %
HCT: 39.7 % (ref 35.0–45.0)
Hemoglobin: 13.3 g/dL (ref 11.7–15.5)
Lymphs Abs: 2772 cells/uL (ref 850–3900)
MCH: 29.4 pg (ref 27.0–33.0)
MCHC: 33.5 g/dL (ref 32.0–36.0)
MCV: 87.6 fL (ref 80.0–100.0)
MPV: 9.1 fL (ref 7.5–12.5)
Monocytes Relative: 8.4 %
Neutro Abs: 2173 cells/uL (ref 1500–7800)
Neutrophils Relative %: 38.8 %
Platelets: 251 10*3/uL (ref 140–400)
RBC: 4.53 10*6/uL (ref 3.80–5.10)
RDW: 13.9 % (ref 11.0–15.0)
Total Lymphocyte: 49.5 %
WBC: 5.6 10*3/uL (ref 3.8–10.8)

## 2020-01-17 LAB — HEMOGLOBIN A1C
Hgb A1c MFr Bld: 5.8 % of total Hgb — ABNORMAL HIGH (ref <5.7)
Mean Plasma Glucose: 120 (calc)
eAG (mmol/L): 6.6 (calc)

## 2020-01-17 LAB — URINALYSIS
Bilirubin Urine: NEGATIVE
Glucose, UA: NEGATIVE
Hgb urine dipstick: NEGATIVE
Ketones, ur: NEGATIVE
Nitrite: NEGATIVE
Protein, ur: NEGATIVE
Specific Gravity, Urine: 1.014 (ref 1.001–1.03)
pH: 6.5 (ref 5.0–8.0)

## 2020-01-17 LAB — TSH: TSH: 1.82 mIU/L (ref 0.40–4.50)

## 2020-01-17 LAB — LIPID PANEL
Cholesterol: 216 mg/dL — ABNORMAL HIGH (ref <200)
HDL: 57 mg/dL (ref >=50)
LDL Cholesterol (Calc): 132 mg/dL (calc) — ABNORMAL HIGH
Non-HDL Cholesterol (Calc): 159 mg/dL (calc) — ABNORMAL HIGH (ref <130)
Total CHOL/HDL Ratio: 3.8 (calc) (ref <5.0)
Triglycerides: 159 mg/dL — ABNORMAL HIGH (ref <150)

## 2020-02-01 DIAGNOSIS — H524 Presbyopia: Secondary | ICD-10-CM | POA: Diagnosis not present

## 2020-02-01 DIAGNOSIS — H35033 Hypertensive retinopathy, bilateral: Secondary | ICD-10-CM | POA: Diagnosis not present

## 2020-02-01 DIAGNOSIS — H0288A Meibomian gland dysfunction right eye, upper and lower eyelids: Secondary | ICD-10-CM | POA: Diagnosis not present

## 2020-02-01 DIAGNOSIS — H0288B Meibomian gland dysfunction left eye, upper and lower eyelids: Secondary | ICD-10-CM | POA: Diagnosis not present

## 2020-02-01 DIAGNOSIS — H2513 Age-related nuclear cataract, bilateral: Secondary | ICD-10-CM | POA: Diagnosis not present

## 2020-02-01 DIAGNOSIS — H52223 Regular astigmatism, bilateral: Secondary | ICD-10-CM | POA: Diagnosis not present

## 2020-02-01 DIAGNOSIS — H40013 Open angle with borderline findings, low risk, bilateral: Secondary | ICD-10-CM | POA: Diagnosis not present

## 2020-02-01 DIAGNOSIS — H5213 Myopia, bilateral: Secondary | ICD-10-CM | POA: Diagnosis not present

## 2020-02-10 ENCOUNTER — Encounter: Payer: Self-pay | Admitting: Internal Medicine

## 2020-03-06 ENCOUNTER — Ambulatory Visit (AMBULATORY_SURGERY_CENTER): Payer: Self-pay

## 2020-03-06 ENCOUNTER — Other Ambulatory Visit: Payer: Self-pay

## 2020-03-06 ENCOUNTER — Telehealth (AMBULATORY_SURGERY_CENTER): Payer: Self-pay

## 2020-03-06 VITALS — Ht 66.0 in | Wt 222.0 lb

## 2020-03-06 DIAGNOSIS — Z1211 Encounter for screening for malignant neoplasm of colon: Secondary | ICD-10-CM

## 2020-03-06 MED ORDER — NA SULFATE-K SULFATE-MG SULF 17.5-3.13-1.6 GM/177ML PO SOLN: 1.0000 | Freq: Once | ORAL | 0 refills | Status: AC

## 2020-03-06 MED ORDER — SUTAB 1479-225-188 MG PO TABS: 12.0000 | ORAL_TABLET | ORAL | 0 refills | Status: DC

## 2020-03-06 NOTE — Progress Notes (Signed)
No allergies to soy or egg Pt is not on blood thinners or diet pills Denies issues with sedation/intubation Denies atrial flutter/fib Denies constipation   Pt is aware of Covid safety and care partner requirements.  Chart flagged as blood thinner-pt stated--pt confirmed she was not on any kind of blood thinner.   Cancer free at this time.  It was all in 2011-no further tx needed.   Pt is unsure if she can get the pills down, but decided she'd rather try than to go to a liquid based prep.

## 2020-03-06 NOTE — Telephone Encounter (Signed)
Pt returns as walk in requesting change from [place] to [place].  Prep letter created and given to pt with review. Pt demonstrated her understanding.

## 2020-04-11 ENCOUNTER — Encounter: Payer: Self-pay | Admitting: Internal Medicine

## 2020-04-11 ENCOUNTER — Ambulatory Visit (AMBULATORY_SURGERY_CENTER): Payer: Medicare PPO | Admitting: Internal Medicine

## 2020-04-11 ENCOUNTER — Other Ambulatory Visit: Payer: Self-pay

## 2020-04-11 VITALS — BP 109/68 | HR 60 | Temp 97.1°F | Resp 11 | Ht 66.0 in | Wt 222.0 lb

## 2020-04-11 DIAGNOSIS — D122 Benign neoplasm of ascending colon: Secondary | ICD-10-CM | POA: Diagnosis not present

## 2020-04-11 DIAGNOSIS — Z1211 Encounter for screening for malignant neoplasm of colon: Secondary | ICD-10-CM

## 2020-04-11 DIAGNOSIS — I10 Essential (primary) hypertension: Secondary | ICD-10-CM | POA: Diagnosis not present

## 2020-04-11 MED ORDER — SODIUM CHLORIDE 0.9 % IV SOLN: 500.0000 mL | Freq: Once | INTRAVENOUS | Status: DC

## 2020-04-11 NOTE — Patient Instructions (Signed)
Please read handouts provided. Continue present medications. Await pathology results.   YOU HAD AN ENDOSCOPIC PROCEDURE TODAY AT THE [facility]:   Refer to the procedure report that was given to you for any specific questions about what was found during the examination.  If the procedure report does not answer your questions, please call your gastroenterologist to clarify.  If you requested that your care partner not be given the details of your procedure findings, then the procedure report has been included in a sealed envelope for you to review at your convenience later.  YOU SHOULD EXPECT: Some feelings of bloating in the abdomen. Passage of more gas than usual.  Walking can help get rid of the air that was put into your GI tract during the procedure and reduce the bloating. If you had a lower endoscopy (such as a colonoscopy or flexible sigmoidoscopy) you may notice spotting of blood in your stool or on the toilet paper. If you underwent a bowel prep for your procedure, you may not have a normal bowel movement for a few days.  Please Note:  You might notice some irritation and congestion in your nose or some drainage.  This is from the oxygen used during your procedure.  There is no need for concern and it should clear up in a day or so.  SYMPTOMS TO REPORT IMMEDIATELY:  Following lower endoscopy (colonoscopy or flexible sigmoidoscopy):  Excessive amounts of blood in the stool  Significant tenderness or worsening of abdominal pains  Swelling of the abdomen that is new, acute  Fever of 100F or higher   For urgent or emergent issues, a gastroenterologist can be reached at any hour by calling [phone]. Do not use MyChart messaging for urgent concerns.    DIET:  We do recommend a small meal at first, but then you may proceed to your regular diet.  Drink plenty of fluids but you should avoid alcoholic beverages for 24 hours.  ACTIVITY:  You should plan to take it easy  for the rest of today and you should NOT DRIVE or use heavy machinery until tomorrow (because of the sedation medicines used during the test).    FOLLOW UP: Our staff will call the number listed on your records 48-72 hours following your procedure to check on you and address any questions or concerns that you may have regarding the information given to you following your procedure. If we do not reach you, we will leave a message.  We will attempt to reach you two times.  During this call, we will ask if you have developed any symptoms of COVID 19. If you develop any symptoms (ie: fever, flu-like symptoms, shortness of breath, cough etc.) before then, please call [phone].  If you test positive for Covid 19 in the 2 weeks post procedure, please call and report this information to us.    If any biopsies were taken you will be contacted by phone or by letter within the next 1-3 weeks.  Please call us at [phone] if you have not heard about the biopsies in 3 weeks.    SIGNATURES/CONFIDENTIALITY: You and/or your care partner have signed paperwork which will be entered into your electronic medical record.  These signatures attest to the fact that that the information above on your After Visit Summary has been reviewed and is understood.  Full responsibility of the confidentiality of this discharge information lies with you and/or your care-partner.  

## 2020-04-11 NOTE — Progress Notes (Signed)
VS done by EW.   No changes to medical or social hx since previsit.

## 2020-04-11 NOTE — Op Note (Signed)
[facility] Patient Name: Christine Morales Procedure Date: 04/11/2020 9:52 AM MRN: [id number] Endoscopist: Docia Chuck. Henrene Pastor MD, MD Age: 74 Referring MD:  Date of Birth: 1945-11-07 Gender: Female Account #: [id number] Procedure:                Colonoscopy with cold snare polypectomy x 1 Indications:              Screening for colorectal malignant neoplasm.                            Previous examinations 2002 and 2008 were normal Medicines:                Monitored Anesthesia Care Procedure:                Pre-Anesthesia Assessment:                           - Prior to the procedure, a History and Physical                            was performed, and patient medications and                            allergies were reviewed. The patient's tolerance of                            previous anesthesia was also reviewed. The risks                            and benefits of the procedure and the sedation                            options and risks were discussed with the patient.                            All questions were answered, and informed consent                            was obtained. Prior Anticoagulants: The patient has                            taken no previous anticoagulant or antiplatelet                            agents. ASA Grade Assessment: II - A patient with                            mild systemic disease. After reviewing the risks                            and benefits, the patient was deemed in                            satisfactory condition to undergo the procedure.  After obtaining informed consent, the colonoscope                            was passed under direct vision. Throughout the                            procedure, the patient's blood pressure, pulse, and                            oxygen saturations were monitored continuously. The                            Colonoscope was introduced through the anus and                             advanced to the the cecum, identified by                            appendiceal orifice and ileocecal valve. The                            ileocecal valve, appendiceal orifice, and rectum                            were photographed. The quality of the bowel                            preparation was excellent. The colonoscopy was                            performed without difficulty. The patient tolerated                            the procedure well. The bowel preparation used was                            SUPREP via split dose instruction. Scope In: 10:06:38 AM Scope Out: 10:18:49 AM Scope Withdrawal Time: 0 hours 9 minutes 11 seconds  Total Procedure Duration: 0 hours 12 minutes 11 seconds  Findings:                 A 1 mm polyp was found in the ascending colon. The                            polyp was removed with a cold snare. Resection and                            retrieval were complete.                           The exam was otherwise without abnormality on                            direct and retroflexion views. Complications:  No immediate complications. Estimated blood loss:                            None. Estimated Blood Loss:     Estimated blood loss: none. Impression:               - One 1 mm polyp in the ascending colon, removed                            with a cold snare. Resected and retrieved.                           - The examination was otherwise normal on direct                            and retroflexion views. Recommendation:           - Repeat colonoscopy is not recommended for                            surveillance.                           - Patient has a contact number available for                            emergencies. The signs and symptoms of potential                            delayed complications were discussed with the                            patient. Return to normal activities tomorrow.                             Written discharge instructions were provided to the                            patient.                           - Resume previous diet.                           - Continue present medications.                           - Await pathology results. Docia Chuck. Henrene Pastor MD, MD 04/11/2020 97:94:80 AM This report has been signed electronically.

## 2020-04-11 NOTE — Progress Notes (Signed)
Called to room to assist during endoscopic procedure.  Patient ID and intended procedure confirmed with present staff. Received instructions for my participation in the procedure from the performing physician.  

## 2020-04-11 NOTE — Progress Notes (Signed)
Report to PACU, RN, vss, BBS= Clear.  

## 2020-04-13 ENCOUNTER — Telehealth: Payer: Self-pay | Admitting: *Deleted

## 2020-04-13 DIAGNOSIS — Z1231 Encounter for screening mammogram for malignant neoplasm of breast: Secondary | ICD-10-CM | POA: Diagnosis not present

## 2020-04-13 NOTE — Telephone Encounter (Signed)
1. Have you developed a fever since your procedure? no  2.   Have you had an respiratory symptoms (SOB or cough) since your procedure? no  3.   Have you tested positive for COVID 19 since your procedure no  4.   Have you had any family members/close contacts diagnosed with the COVID 19 since your procedure?  no   If yes to any of these questions please route to Joylene John, RN and Joella Prince, RN Follow up Call-  Call back number 04/11/2020  Post procedure Call Back phone  # [phone]  Permission to leave phone message Yes  Some recent data might be hidden     Patient questions:  Do you have a fever, pain , or abdominal swelling? No. Pain Score  0 *  Have you tolerated food without any problems? Yes.    Have you been able to return to your normal activities? Yes.    Do you have any questions about your discharge instructions: Diet   No. Medications  No. Follow up visit  No.  Do you have questions or concerns about your Care? No.  Actions: * If pain score is 4 or above: No action needed, pain <4.

## 2020-04-16 ENCOUNTER — Telehealth: Payer: Self-pay | Admitting: Internal Medicine

## 2020-04-16 ENCOUNTER — Encounter: Payer: Self-pay | Admitting: Internal Medicine

## 2020-04-16 DIAGNOSIS — N959 Unspecified menopausal and perimenopausal disorder: Secondary | ICD-10-CM

## 2020-04-16 DIAGNOSIS — M1711 Unilateral primary osteoarthritis, right knee: Secondary | ICD-10-CM | POA: Diagnosis not present

## 2020-04-16 DIAGNOSIS — M25561 Pain in right knee: Secondary | ICD-10-CM | POA: Diagnosis not present

## 2020-04-16 NOTE — Telephone Encounter (Signed)
Patient wondering when she needs her next bone density test.  Patient # [phone]

## 2020-04-19 DIAGNOSIS — Z833 Family history of diabetes mellitus: Secondary | ICD-10-CM | POA: Diagnosis not present

## 2020-04-19 DIAGNOSIS — C9001 Multiple myeloma in remission: Secondary | ICD-10-CM | POA: Diagnosis not present

## 2020-04-19 DIAGNOSIS — E039 Hypothyroidism, unspecified: Secondary | ICD-10-CM | POA: Diagnosis not present

## 2020-04-19 DIAGNOSIS — I10 Essential (primary) hypertension: Secondary | ICD-10-CM | POA: Diagnosis not present

## 2020-04-19 DIAGNOSIS — Z6835 Body mass index (BMI) 35.0-35.9, adult: Secondary | ICD-10-CM | POA: Diagnosis not present

## 2020-04-19 DIAGNOSIS — Z8249 Family history of ischemic heart disease and other diseases of the circulatory system: Secondary | ICD-10-CM | POA: Diagnosis not present

## 2020-04-19 NOTE — Telephone Encounter (Signed)
Called pt there was no answer LMOM MD has ordered bone density test. She will receive a call once appt has been made.Marland KitchenJohny Chess

## 2020-04-19 NOTE — Telephone Encounter (Signed)
Ordered BDS Thx

## 2020-04-21 ENCOUNTER — Ambulatory Visit: Payer: Medicare PPO | Attending: Internal Medicine

## 2020-04-21 DIAGNOSIS — Z23 Encounter for immunization: Secondary | ICD-10-CM

## 2020-04-21 NOTE — Progress Notes (Signed)
   Covid-19 Vaccination Clinic  Name:  Christine Morales    MRN: [id number] DOB: [birth year]  04/21/2020  [name] was observed post Covid-19 immunization for 15 minutes without incident. She was provided with Vaccine Information Sheet and instruction to access the V-Safe system.   [name] was instructed to call 911 with any severe reactions post vaccine: Marland Kitchen Difficulty breathing  . Swelling of face and throat  . A fast heartbeat  . A bad rash all over body  . Dizziness and weakness

## 2020-04-24 ENCOUNTER — Ambulatory Visit (INDEPENDENT_AMBULATORY_CARE_PROVIDER_SITE_OTHER)
Admission: RE | Admit: 2020-04-24 | Discharge: 2020-04-24 | Disposition: A | Discharge status: Home / Self Care | Payer: Medicare PPO | Source: Ambulatory Visit | Attending: Internal Medicine | Admitting: Internal Medicine

## 2020-04-24 ENCOUNTER — Other Ambulatory Visit: Payer: Self-pay

## 2020-04-24 DIAGNOSIS — N959 Unspecified menopausal and perimenopausal disorder: Secondary | ICD-10-CM | POA: Diagnosis not present

## 2020-07-23 ENCOUNTER — Ambulatory Visit: Payer: Medicare PPO | Admitting: Internal Medicine

## 2020-08-20 ENCOUNTER — Encounter: Payer: Self-pay | Admitting: Internal Medicine

## 2020-08-20 ENCOUNTER — Ambulatory Visit: Payer: Medicare PPO | Admitting: Internal Medicine

## 2020-08-20 ENCOUNTER — Other Ambulatory Visit: Payer: Self-pay

## 2020-08-20 VITALS — BP 118/70 | HR 67 | Temp 98.2°F | Ht 66.0 in | Wt 218.8 lb

## 2020-08-20 DIAGNOSIS — N1832 Chronic kidney disease, stage 3b: Secondary | ICD-10-CM | POA: Diagnosis not present

## 2020-08-20 DIAGNOSIS — C8233 Follicular lymphoma grade IIIa, intra-abdominal lymph nodes: Secondary | ICD-10-CM

## 2020-08-20 DIAGNOSIS — R109 Unspecified abdominal pain: Secondary | ICD-10-CM

## 2020-08-20 LAB — CBC WITH DIFFERENTIAL/PLATELET
Basophils Absolute: 0 10*3/uL (ref 0.0–0.1)
Basophils Relative: 0.5 % (ref 0.0–3.0)
Eosinophils Absolute: 0.1 10*3/uL (ref 0.0–0.7)
Eosinophils Relative: 1.4 % (ref 0.0–5.0)
HCT: 37.6 % (ref 36.0–46.0)
Hemoglobin: 12.8 g/dL (ref 12.0–15.0)
Lymphocytes Relative: 48.7 % — ABNORMAL HIGH (ref 12.0–46.0)
Lymphs Abs: 2.8 10*3/uL (ref 0.7–4.0)
MCHC: 33.9 g/dL (ref 30.0–36.0)
MCV: 85.1 fl (ref 78.0–100.0)
Monocytes Absolute: 0.4 10*3/uL (ref 0.1–1.0)
Monocytes Relative: 7.5 % (ref 3.0–12.0)
Neutro Abs: 2.4 10*3/uL (ref 1.4–7.7)
Neutrophils Relative %: 41.9 % — ABNORMAL LOW (ref 43.0–77.0)
Platelets: 247 10*3/uL (ref 150.0–400.0)
RBC: 4.42 Mil/uL (ref 3.87–5.11)
RDW: 14.2 % (ref 11.5–15.5)
WBC: 5.8 10*3/uL (ref 4.0–10.5)

## 2020-08-20 LAB — URINALYSIS, ROUTINE W REFLEX MICROSCOPIC
Bilirubin Urine: NEGATIVE
Hgb urine dipstick: NEGATIVE
Ketones, ur: NEGATIVE
Nitrite: NEGATIVE
RBC / HPF: NONE SEEN (ref 0–?)
Specific Gravity, Urine: 1.02 (ref 1.000–1.030)
Total Protein, Urine: NEGATIVE
Urine Glucose: NEGATIVE
Urobilinogen, UA: 0.2 (ref 0.0–1.0)
pH: 6 (ref 5.0–8.0)

## 2020-08-20 LAB — COMPREHENSIVE METABOLIC PANEL
ALT: 11 U/L (ref 0–35)
AST: 16 U/L (ref 0–37)
Albumin: 4.2 g/dL (ref 3.5–5.2)
Alkaline Phosphatase: 73 U/L (ref 39–117)
BUN: 21 mg/dL (ref 6–23)
CO2: 27 mEq/L (ref 19–32)
Calcium: 9.7 mg/dL (ref 8.4–10.5)
Chloride: 106 mEq/L (ref 96–112)
Creatinine, Ser: 1.7 mg/dL — ABNORMAL HIGH (ref 0.40–1.20)
GFR: 29.79 mL/min — ABNORMAL LOW (ref >60.00)
Glucose, Bld: 90 mg/dL (ref 70–99)
Potassium: 4.4 mEq/L (ref 3.5–5.1)
Sodium: 142 mEq/L (ref 135–145)
Total Bilirubin: 0.6 mg/dL (ref 0.2–1.2)
Total Protein: 6.9 g/dL (ref 6.0–8.3)

## 2020-08-20 MED ORDER — TRAMADOL HCL 50 MG PO TABS: 50.0000 mg | ORAL_TABLET | Freq: Four times a day (QID) | ORAL | 0 refills | Status: AC | PRN

## 2020-08-20 NOTE — Progress Notes (Signed)
Subjective:  Patient ID: Christine Morales, female    DOB: 1946-06-21  Age: 75 y.o. MRN: [id number]  CC: Follow-up (6 month f/u)   HPI Peaches W Spivak presents for a 6 month f/u C/o off and on R flank pain since Nov 2021 - pain is 7/10, worse w/sitting; 1-2 per week F/u B12 def, HTN, gout  Outpatient Medications Prior to Visit  Medication Sig Dispense Refill  . amLODipine (NORVASC) 5 MG tablet TAKE 1 TABLET BY MOUTH EVERY DAY 90 tablet 3  . Aspirin-Salicylamide-Caffeine (BC HEADACHE PO) Take 1 packet by mouth daily as needed (headache).     . Cholecalciferol (VITAMIN D3) 1000 UNITS tablet Take 1,000 Units by mouth daily.    . Cyanocobalamin (VITAMIN B 12 PO) Take 1,000 mcg by mouth daily.    . ferrous sulfate 325 (65 FE) MG tablet Take 325 mg by mouth daily with breakfast.    . levothyroxine (SYNTHROID) 100 MCG tablet TAKE 1 TABLET BY MOUTH EVERY DAY 90 tablet 3  . losartan (COZAAR) 100 MG tablet Take 1 tablet (100 mg total) by mouth daily. 90 tablet 3  . allopurinol (ZYLOPRIM) 100 MG tablet Take 0.5 tablets (50 mg total) by mouth daily. (Patient not taking: Reported on 08/20/2020) 45 tablet 3  . colchicine 0.6 MG tablet Take two tablets prn gout attack.Then take another one in 1-2 hrs. Do not repeat for 3 days. (Patient not taking: No sig reported) 18 tablet 3  . meclizine (ANTIVERT) 50 MG tablet Take 1 tablet (50 mg total) by mouth 3 (three) times daily as needed. (Patient not taking: Reported on 08/20/2020) 30 tablet 0   Facility-Administered Medications Prior to Visit  Medication Dose Route Frequency Provider Last Rate Last Admin  . heparin lock flush 100 unit/mL  500 Units Intravenous Once Shadad, Firas N, MD      . sodium chloride 0.9 % injection 10 mL  10 mL Intravenous PRN Wyatt Portela, MD      . sodium chloride 0.9 % injection 10 mL  10 mL Intravenous PRN Wyatt Portela, MD   10 mL at 05/20/17 0956    ROS: Review of Systems  Constitutional: Negative for activity change, appetite  change, chills, fatigue and unexpected weight change.  HENT: Negative for congestion, mouth sores and sinus pressure.   Eyes: Negative for visual disturbance.  Respiratory: Negative for cough and chest tightness.   Gastrointestinal: Negative for abdominal pain and nausea.  Genitourinary: Negative for difficulty urinating, frequency and vaginal pain.  Musculoskeletal: Positive for back pain. Negative for gait problem.  Skin: Negative for pallor and rash.  Neurological: Negative for dizziness, tremors, weakness, numbness and headaches.  Psychiatric/Behavioral: Negative for confusion and sleep disturbance.    Objective:  BP 118/70 (BP Location: Left Arm)   Pulse 67   Temp 98.2 F (36.8 C) (Oral)   Ht 5\' 6"  (1.676 m)   Wt 218 lb 12.8 oz (99.2 kg)   SpO2 99%   BMI 35.32 kg/m   BP Readings from Last 3 Encounters:  08/20/20 118/70  04/11/20 109/68  01/16/20 (!) 102/60    Wt Readings from Last 3 Encounters:  08/20/20 218 lb 12.8 oz (99.2 kg)  04/11/20 222 lb (100.7 kg)  03/06/20 222 lb (100.7 kg)    Physical Exam Constitutional:      General: She is not in acute distress.    Appearance: She is well-developed. She is obese.  HENT:     Head: Normocephalic.  Right Ear: External ear normal.     Left Ear: External ear normal.     Nose: Nose normal.     Mouth/Throat:     Mouth: Oropharynx is clear and moist.  Eyes:     General:        Right eye: No discharge.        Left eye: No discharge.     Conjunctiva/sclera: Conjunctivae normal.     Pupils: Pupils are equal, round, and reactive to light.  Neck:     Thyroid: No thyromegaly.     Vascular: No JVD.     Trachea: No tracheal deviation.  Cardiovascular:     Rate and Rhythm: Normal rate and regular rhythm.     Heart sounds: Normal heart sounds.  Pulmonary:     Effort: No respiratory distress.     Breath sounds: No stridor. No wheezing.  Abdominal:     General: Bowel sounds are normal. There is no distension.      Palpations: Abdomen is soft. There is no mass.     Tenderness: There is no abdominal tenderness. There is no guarding or rebound.  Musculoskeletal:        General: No tenderness or edema.     Cervical back: Normal range of motion and neck supple.  Lymphadenopathy:     Cervical: No cervical adenopathy.  Skin:    Findings: No erythema or rash.  Neurological:     Mental Status: She is oriented to person, place, and time.     Cranial Nerves: No cranial nerve deficit.     Motor: No abnormal muscle tone.     Coordination: Coordination normal.     Deep Tendon Reflexes: Reflexes normal.  Psychiatric:        Mood and Affect: Mood and affect normal.        Behavior: Behavior normal.        Thought Content: Thought content normal.        Judgment: Judgment normal.    abd s/NT LS NT abd obese, no mass   Lab Results  Component Value Date   WBC 5.6 01/16/2020   HGB 13.3 01/16/2020   HCT 39.7 01/16/2020   PLT 251 01/16/2020   GLUCOSE 90 01/16/2020   CHOL 216 (H) 01/16/2020   TRIG 159 (H) 01/16/2020   HDL 57 01/16/2020   LDLCALC 132 (H) 01/16/2020   ALT 17 01/16/2020   AST 21 01/16/2020   NA 141 01/16/2020   K 4.5 01/16/2020   CL 107 01/16/2020   CREATININE 1.90 (H) 01/16/2020   BUN 24 01/16/2020   CO2 25 01/16/2020   TSH 1.82 01/16/2020   INR 0.94 12/15/2017   HGBA1C 5.8 (H) 01/16/2020    DG Bone Density  Result Date: 04/29/2020 Date of study: 04/24/20 Exam: DUAL X-RAY ABSORPTIOMETRY (DXA) FOR BONE MINERAL DENSITY (BMD) Instrument: [employer] Provider: PCP Indication: screening for osteoporosis Comparison: none (please note that it is not possible to compare data from different instruments) Clinical data: Pt is a 75 y.o. female without previous fractures. Results:  Lumbar spine L1-L4 Femoral neck (FN) 33% distal radius T-score 1.1 RFN: -0.2 LFN: -1.0 n/a Change in BMD from previous DXA test (%) Up 4.2% Down 2.6% n/a (*) statistically significant Assessment:  the BMD is normal according to the [facility] classification for osteoporosis (see below). Fracture risk: low FRAX score: not calculated due to normal BMD Comments: the technical quality of the study is good Recommend optimizing calcium (1200  mg/day) and vitamin D (800 IU/day) intake. No pharmacological treatment is indicated. Followup: Repeat BMD is appropriate after 2 years. WHO criteria for diagnosis of osteoporosis in postmenopausal women and in men 2 y/o or older: - normal: T-score -1.0 to + 1.0 - osteopenia/low bone density: T-score between -2.5 and -1.0 - osteoporosis: T-score below -2.5 - severe osteoporosis: T-score below -2.5 with history of fragility fracture Note: although not part of the WHO classification, the presence of a fragility fracture, regardless of the T-score, should be considered diagnostic of osteoporosis, provided other causes for the fracture have been excluded. Loura Pardon MD    Assessment & Plan:    Walker Kehr, MD

## 2020-08-20 NOTE — Assessment & Plan Note (Signed)
h/o follicular lymphoma, non-Hodgkin type, stage IIA, presented with a large mesenteric mass in July 2011

## 2020-08-20 NOTE — Assessment & Plan Note (Addendum)
h/o follicular lymphoma, non-Hodgkin type, stage IIA, presented with a large mesenteric mass in July 2011  CBC, CMET Abd [place] CT abd if needed (elev creat) Tramadol prn  Potential benefits of a short/long term opioids use as well as potential risks (i.e. addiction risk, apnea etc) and complications (i.e. Somnolence, constipation and others) were explained to the patient and were aknowledged.

## 2020-08-20 NOTE — Assessment & Plan Note (Signed)
CBC, CMET Abd [place] CT abd if needed (pt has elev creat for CT)

## 2020-08-20 NOTE — Addendum Note (Signed)
Addended by: Jacobo Forest on: 08/20/2020 09:58 AM   Modules accepted: Orders

## 2020-08-22 ENCOUNTER — Other Ambulatory Visit: Payer: Medicare PPO

## 2020-09-05 ENCOUNTER — Ambulatory Visit
Admission: RE | Admit: 2020-09-05 | Discharge: 2020-09-05 | Disposition: A | Discharge status: Home / Self Care | Payer: Medicare PPO | Source: Ambulatory Visit | Attending: Internal Medicine | Admitting: Internal Medicine

## 2020-09-05 DIAGNOSIS — C8233 Follicular lymphoma grade IIIa, intra-abdominal lymph nodes: Secondary | ICD-10-CM

## 2020-09-05 DIAGNOSIS — N281 Cyst of kidney, acquired: Secondary | ICD-10-CM | POA: Diagnosis not present

## 2020-09-05 DIAGNOSIS — R10A0 Flank pain, unspecified side: Secondary | ICD-10-CM

## 2020-09-05 DIAGNOSIS — K7689 Other specified diseases of liver: Secondary | ICD-10-CM | POA: Diagnosis not present

## 2020-09-05 DIAGNOSIS — K760 Fatty (change of) liver, not elsewhere classified: Secondary | ICD-10-CM | POA: Diagnosis not present

## 2020-09-05 DIAGNOSIS — R109 Unspecified abdominal pain: Secondary | ICD-10-CM

## 2020-09-05 IMAGING — US US ABDOMEN COMPLETE
1 series · 13 of 25 positions shown · non-contrast
Comparison: CT abdomen pelvis [DATE].

CLINICAL DATA: Right flank pain x4 months

EXAM:
ABDOMEN ULTRASOUND COMPLETE

[Series 1: us abdomen complete · 0.25mm/px · 13 of 78 slices shown]
[im 1/78]
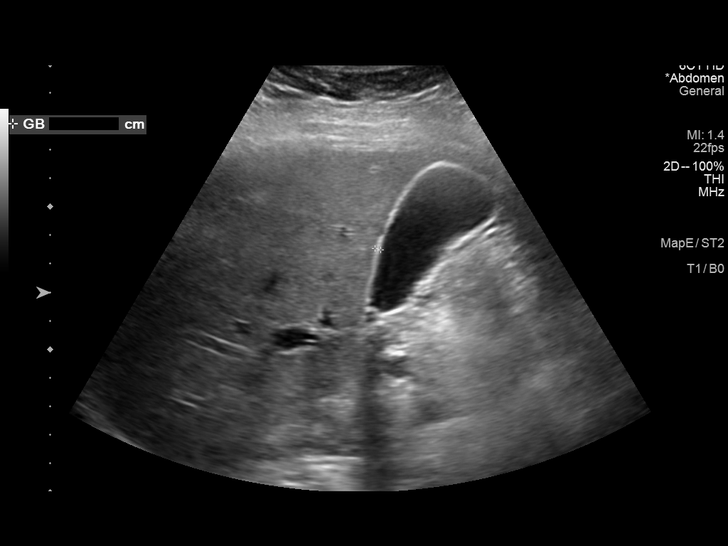
[im 7/78]
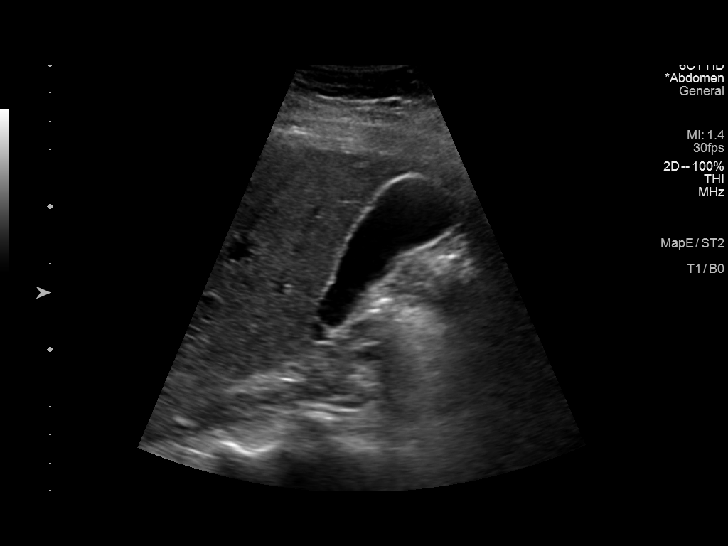
[im 13/78]
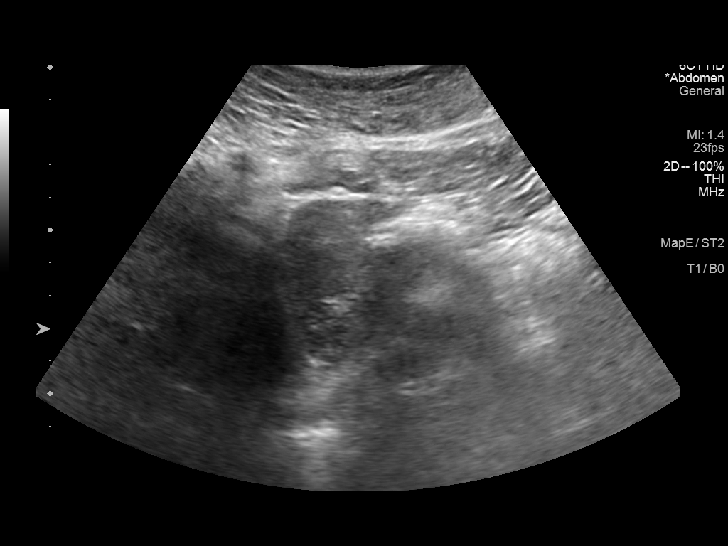
[im 20/78]
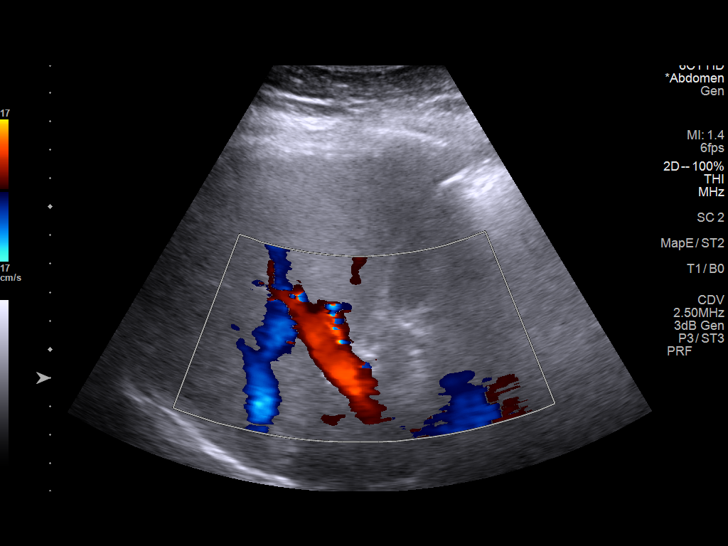
[im 26/78]
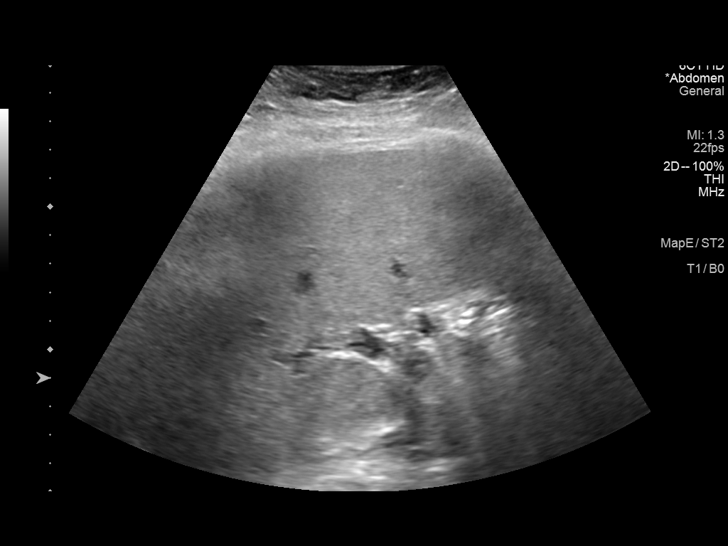
[im 33/78]
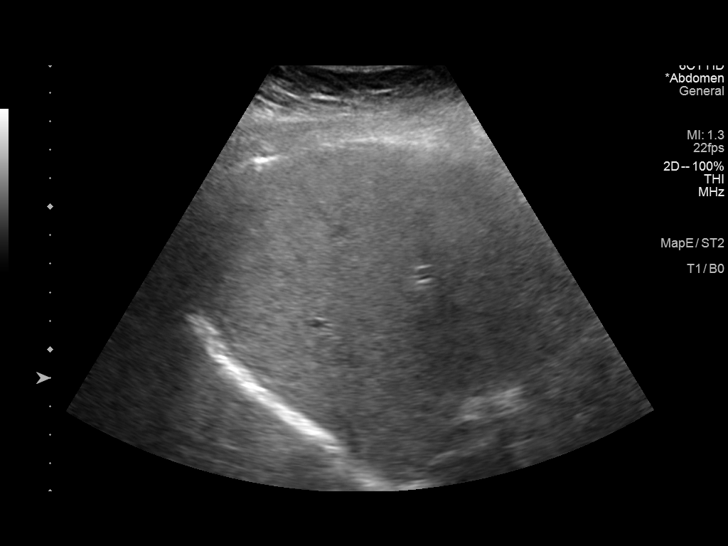
[im 39/78]
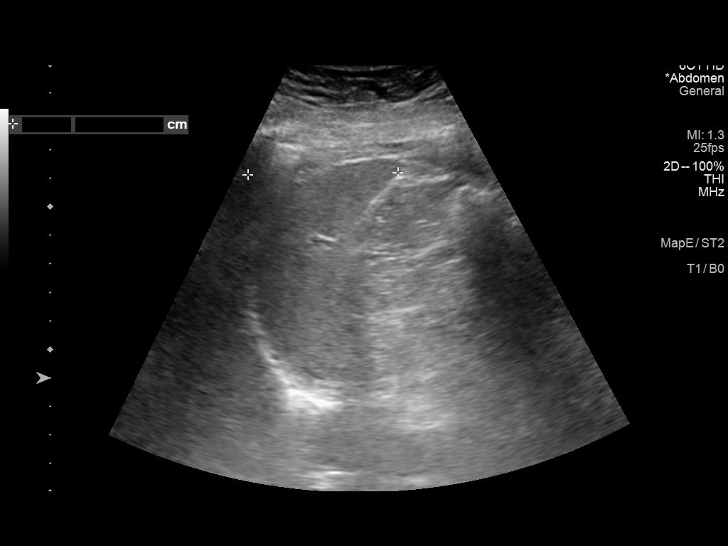
[im 45/78]
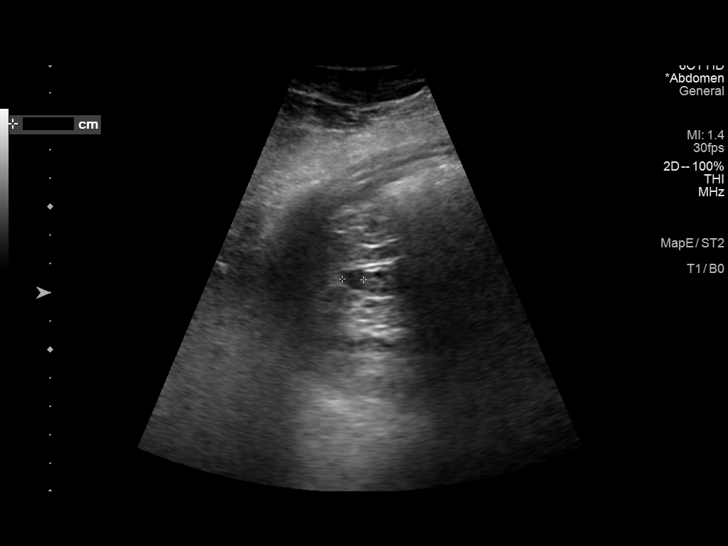
[im 52/78]
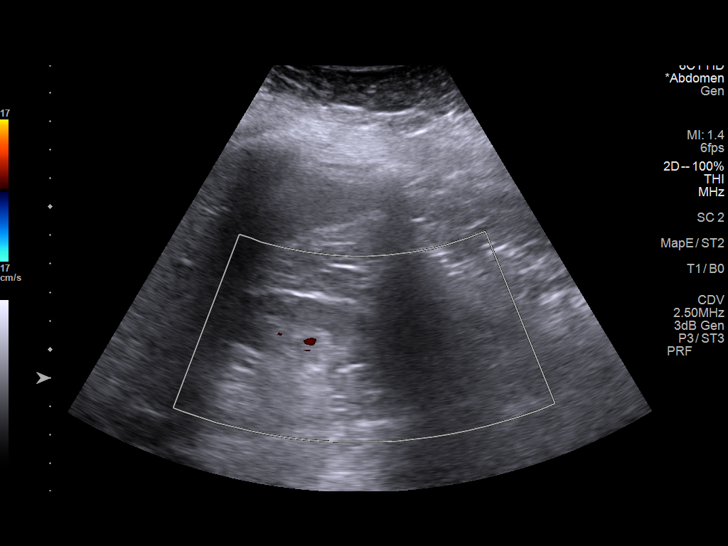
[im 58/78]
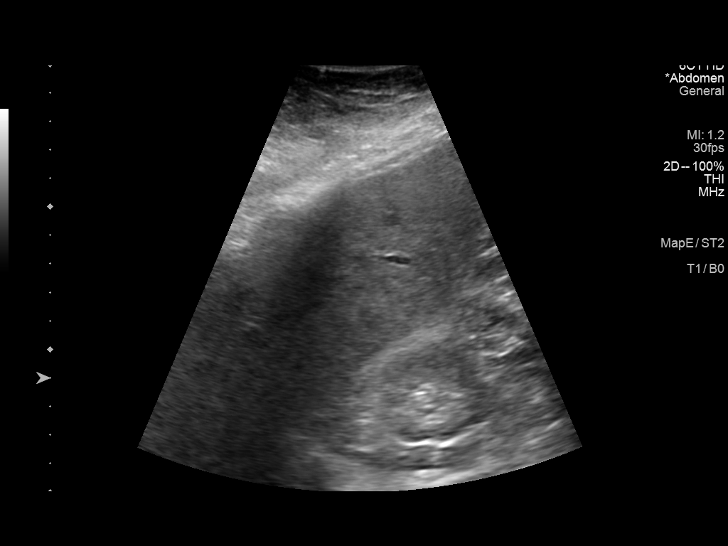
[im 65/78]
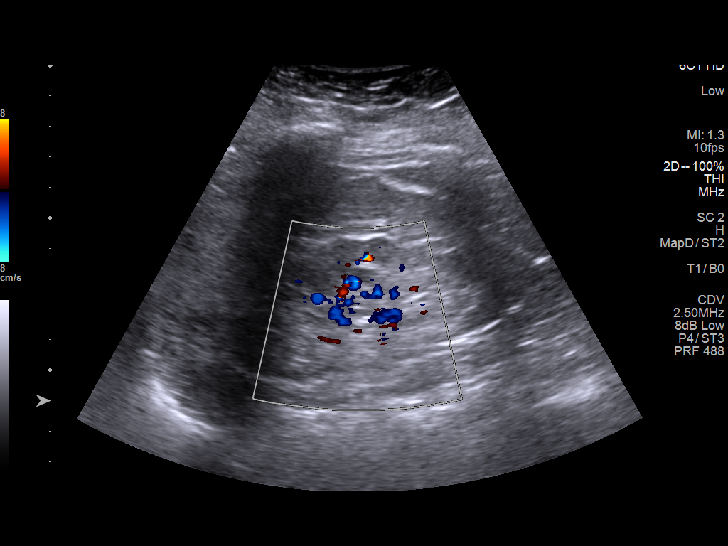
[im 71/78]
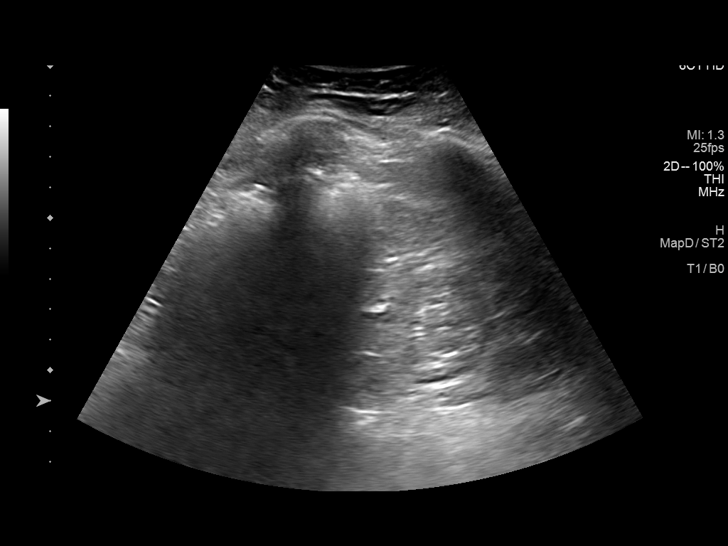
[im 78/78]
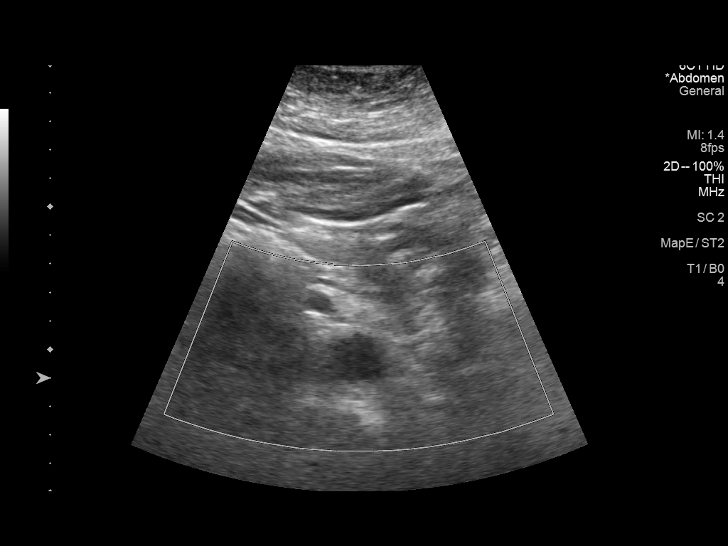

[13 of 25 positions shown; findings below may reference images not displayed]

FINDINGS: Gallbladder: No gallstones or wall thickening visualized. No
sonographic Murphy sign noted by sonographer.

Common bile duct: Diameter: 5 mm

Liver: No focal lesion identified. Diffusely increased parenchymal
echogenicity. Portal vein is patent on color Doppler imaging with
normal direction of blood flow towards the liver.

IVC: No abnormality visualized.

Pancreas: Obscured by bowel gas.

Spleen: Size and appearance within normal limits.

Right Kidney: Length: 9.3 cm. Echogenicity within normal limits. No
hydronephrosis. 8 mm complex interpolar cyst.

Left Kidney: Length: 9.6 cm. Echogenicity within normal limits. No
mass or hydronephrosis visualized.

Abdominal aorta: No aneurysm visualized.

Other findings: None.
IMPRESSION: 1. The echogenicity of the liver is increased. This is a nonspecific
finding but is most commonly seen with fatty infiltration of the
liver. There are no obvious focal liver lesions.
2. Complex 8 mm right interpolar cyst, this could be further
characterized with renal protocol MRI with and without contrast if
clinically indicated.

## 2020-09-17 ENCOUNTER — Other Ambulatory Visit: Payer: Self-pay

## 2020-09-18 ENCOUNTER — Other Ambulatory Visit: Payer: Self-pay

## 2020-09-18 ENCOUNTER — Ambulatory Visit: Payer: Medicare PPO | Admitting: Internal Medicine

## 2020-09-18 ENCOUNTER — Encounter: Payer: Self-pay | Admitting: Internal Medicine

## 2020-09-18 DIAGNOSIS — K760 Fatty (change of) liver, not elsewhere classified: Secondary | ICD-10-CM | POA: Diagnosis not present

## 2020-09-18 DIAGNOSIS — N281 Cyst of kidney, acquired: Secondary | ICD-10-CM | POA: Diagnosis not present

## 2020-09-18 DIAGNOSIS — R109 Unspecified abdominal pain: Secondary | ICD-10-CM | POA: Diagnosis not present

## 2020-09-18 NOTE — Assessment & Plan Note (Signed)
Loose wt, good diet

## 2020-09-18 NOTE — Assessment & Plan Note (Addendum)
R flank pain x 5 months - no pain now. Resolving Abd US IMPRESSION: 1. The echogenicity of the liver is increased. This is a nonspecific finding but is most commonly seen with fatty infiltration of the liver. There are no obvious focal liver lesions. 2. Complex 8 mm right interpolar cyst, this could be further characterized with renal protocol MRI with and without contrast if clinically indicated. Tramadol prn  Potential benefits of a short/long term opioids use as well as potential risks (i.e. addiction risk, apnea etc) and complications (i.e. Somnolence, constipation and others) were explained to the patient and were aknowledged.

## 2020-09-18 NOTE — Assessment & Plan Note (Addendum)
08/2020 [place]: Complex 8 mm right interpolar cyst Pt declined MRI Repeat US in 4 mo

## 2020-09-18 NOTE — Progress Notes (Signed)
Subjective:  Patient ID: Christine Morales, female    DOB: 02/12/1946  Age: 75 y.o. MRN: [id number]  CC: Follow-up (4 week f/u)   HPI Christine Morales presents for R flank pain x 5 months - no pain now. Abd US IMPRESSION: 1. The echogenicity of the liver is increased. This is a nonspecific finding but is most commonly seen with fatty infiltration of the liver. There are no obvious focal liver lesions. 2. Complex 8 mm right interpolar cyst, this could be further characterized with renal protocol MRI with and without contrast if clinically indicated. Tramadol prn  Potential benefits of a short/long term opioids use as well as potential risks (i.e. addiction risk, apnea etc) and complications (i.e. Somnolence, constipation and others) were explained to the patient and were aknowledged.  Outpatient Medications Prior to Visit  Medication Sig Dispense Refill  . amLODipine (NORVASC) 5 MG tablet TAKE 1 TABLET BY MOUTH EVERY DAY 90 tablet 3  . Aspirin-Salicylamide-Caffeine (BC HEADACHE PO) Take 1 packet by mouth daily as needed (headache).     . Cholecalciferol (VITAMIN D3) 1000 UNITS tablet Take 1,000 Units by mouth daily.    . Cyanocobalamin (VITAMIN B 12 PO) Take 1,000 mcg by mouth daily.    . ferrous sulfate 325 (65 FE) MG tablet Take 325 mg by mouth daily with breakfast.    . levothyroxine (SYNTHROID) 100 MCG tablet TAKE 1 TABLET BY MOUTH EVERY DAY 90 tablet 3  . losartan (COZAAR) 100 MG tablet Take 1 tablet (100 mg total) by mouth daily. 90 tablet 3   Facility-Administered Medications Prior to Visit  Medication Dose Route Frequency Provider Last Rate Last Admin  . heparin lock flush 100 unit/mL  500 Units Intravenous Once Shadad, Firas N, MD      . sodium chloride 0.9 % injection 10 mL  10 mL Intravenous PRN Wyatt Portela, MD      . sodium chloride 0.9 % injection 10 mL  10 mL Intravenous PRN Wyatt Portela, MD   10 mL at 05/20/17 0956    ROS: Review of Systems  Constitutional: Negative  for activity change, appetite change, chills, fatigue and unexpected weight change.  HENT: Negative for congestion, mouth sores and sinus pressure.   Eyes: Negative for visual disturbance.  Respiratory: Negative for cough and chest tightness.   Gastrointestinal: Negative for abdominal pain and nausea.  Genitourinary: Negative for difficulty urinating, frequency and vaginal pain.  Musculoskeletal: Positive for arthralgias, back pain and gait problem.  Skin: Negative for pallor and rash.  Neurological: Negative for dizziness, tremors, weakness, numbness and headaches.  Psychiatric/Behavioral: Negative for confusion and sleep disturbance.    Objective:  BP 120/74 (BP Location: Left Arm)   Pulse (!) 57   Temp 97.9 F (36.6 C) (Oral)   Ht 5\' 6"  (1.676 m)   Wt 217 lb 3.2 oz (98.5 kg)   SpO2 98%   BMI 35.06 kg/m   BP Readings from Last 3 Encounters:  09/18/20 120/74  08/20/20 118/70  04/11/20 109/68    Wt Readings from Last 3 Encounters:  09/18/20 217 lb 3.2 oz (98.5 kg)  08/20/20 218 lb 12.8 oz (99.2 kg)  04/11/20 222 lb (100.7 kg)    Physical Exam Constitutional:      General: She is not in acute distress.    Appearance: She is well-developed. She is obese.  HENT:     Head: Normocephalic.     Right Ear: External ear normal.  Left Ear: External ear normal.     Nose: Nose normal.  Eyes:     General:        Right eye: No discharge.        Left eye: No discharge.     Conjunctiva/sclera: Conjunctivae normal.     Pupils: Pupils are equal, round, and reactive to light.  Neck:     Thyroid: No thyromegaly.     Vascular: No JVD.     Trachea: No tracheal deviation.  Cardiovascular:     Rate and Rhythm: Normal rate and regular rhythm.     Heart sounds: Normal heart sounds.  Pulmonary:     Effort: No respiratory distress.     Breath sounds: No stridor. No wheezing.  Abdominal:     General: Bowel sounds are normal. There is no distension.     Palpations: Abdomen is soft.  There is no mass.     Tenderness: There is no abdominal tenderness. There is no guarding or rebound.  Musculoskeletal:        General: No tenderness.     Cervical back: Normal range of motion and neck supple.  Lymphadenopathy:     Cervical: No cervical adenopathy.  Skin:    Findings: No erythema or rash.  Neurological:     Mental Status: She is oriented to person, place, and time.     Cranial Nerves: No cranial nerve deficit.     Motor: No abnormal muscle tone.     Coordination: Coordination normal.     Deep Tendon Reflexes: Reflexes normal.  Psychiatric:        Behavior: Behavior normal.        Thought Content: Thought content normal.        Judgment: Judgment normal.    Knees w/pain  Lab Results  Component Value Date   WBC 5.8 08/20/2020   HGB 12.8 08/20/2020   HCT 37.6 08/20/2020   PLT 247.0 08/20/2020   GLUCOSE 90 08/20/2020   CHOL 216 (H) 01/16/2020   TRIG 159 (H) 01/16/2020   HDL 57 01/16/2020   LDLCALC 132 (H) 01/16/2020   ALT 11 08/20/2020   AST 16 08/20/2020   NA 142 08/20/2020   K 4.4 08/20/2020   CL 106 08/20/2020   CREATININE 1.70 (H) 08/20/2020   BUN 21 08/20/2020   CO2 27 08/20/2020   TSH 1.82 01/16/2020   INR 0.94 12/15/2017   HGBA1C 5.8 (H) 01/16/2020    US Abdomen Complete  Result Date: 09/05/2020 CLINICAL DATA:  Right flank pain x4 months EXAM: ABDOMEN ULTRASOUND COMPLETE COMPARISON:  CT abdomen pelvis July 23, 2015. FINDINGS: Gallbladder: No gallstones or wall thickening visualized. No sonographic Murphy sign noted by sonographer. Common bile duct: Diameter: 5 mm Liver: No focal lesion identified. Diffusely increased parenchymal echogenicity. Portal vein is patent on color Doppler imaging with normal direction of blood flow towards the liver. IVC: No abnormality visualized. Pancreas: Obscured by bowel gas. Spleen: Size and appearance within normal limits. Right Kidney: Length: 9.3 cm. Echogenicity within normal limits. No hydronephrosis. 8 mm  complex interpolar cyst. Left Kidney: Length: 9.6 cm. Echogenicity within normal limits. No mass or hydronephrosis visualized. Abdominal aorta: No aneurysm visualized. Other findings: None. IMPRESSION: 1. The echogenicity of the liver is increased. This is a nonspecific finding but is most commonly seen with fatty infiltration of the liver. There are no obvious focal liver lesions. 2. Complex 8 mm right interpolar cyst, this could be further characterized with renal protocol MRI with  and without contrast if clinically indicated. Electronically Signed   By: Dahlia Bailiff MD   On: 09/05/2020 11:01    Assessment & Plan:   There are no diagnoses linked to this encounter.   No orders of the defined types were placed in this encounter.    Follow-up: No follow-ups on file.  Walker Kehr, MD

## 2020-09-25 ENCOUNTER — Other Ambulatory Visit: Payer: Self-pay

## 2020-09-25 ENCOUNTER — Ambulatory Visit (INDEPENDENT_AMBULATORY_CARE_PROVIDER_SITE_OTHER): Payer: Medicare PPO

## 2020-09-25 DIAGNOSIS — Z0000 Encounter for general adult medical examination without abnormal findings: Secondary | ICD-10-CM | POA: Diagnosis not present

## 2020-09-25 NOTE — Patient Instructions (Signed)
Christine Morales , Thank you for taking time to come for your Medicare Wellness Visit. I appreciate your ongoing commitment to your health goals. Please review the following plan we discussed and let me know if I can assist you in the future.   Screening recommendations/referrals: Colonoscopy: 04/11/2020; no repeat due to age Mammogram: 04/13/2020; normal results Bone Density: 04/24/2020; normal results Recommended yearly ophthalmology/optometry visit for glaucoma screening and checkup Recommended yearly dental visit for hygiene and checkup  Vaccinations: Influenza vaccine: 03/23/2020 Pneumococcal vaccine: 09/06/2012, 10/21/2017 (need booster of Pneumovax 23) Tdap vaccine: 02/12/2015; due every 10 years Shingles vaccine: never done; can check with local pharmacy for cost   Covid-19: [place] 07/30/2019, 09/13/2019, 04/21/2020  Advanced directives: Please bring a copy of your health care power of attorney and living will to the office at your convenience.  Conditions/risks identified: Yes; Reviewed health maintenance screenings with patient today and relevant education, vaccines, and/or referrals were provided. Please continue to do your personal lifestyle choices by: daily care of teeth and gums, regular physical activity (goal should be 5 days a week for 30 minutes), eat a healthy diet, avoid tobacco and drug use, limiting any alcohol intake, taking a low-dose aspirin (if not allergic or have been advised by your provider otherwise) and taking vitamins and minerals as recommended by your provider. Continue doing brain stimulating activities (puzzles, reading, adult coloring books, staying active) to keep memory sharp. Continue to eat heart healthy diet (full of fruits, vegetables, whole grains, lean protein, water--limit salt, fat, and sugar intake) and increase physical activity as tolerated.  Next appointment: Please schedule your next Medicare Wellness Visit with your Nurse Health Advisor in 1 year by calling  [phone].  Preventive Care 20 Years and Older, Female Preventive care refers to lifestyle choices and visits with your health care provider that can promote health and wellness. What does preventive care include?  A yearly physical exam. This is also called an annual well check.  Dental exams once or twice a year.  Routine eye exams. Ask your health care provider how often you should have your eyes checked.  Personal lifestyle choices, including:  Daily care of your teeth and gums.  Regular physical activity.  Eating a healthy diet.  Avoiding tobacco and drug use.  Limiting alcohol use.  Practicing safe sex.  Taking low-dose aspirin every day.  Taking vitamin and mineral supplements as recommended by your health care provider. What happens during an annual well check? The services and screenings done by your health care provider during your annual well check will depend on your age, overall health, lifestyle risk factors, and family history of disease. Counseling  Your health care provider may ask you questions about your:  Alcohol use.  Tobacco use.  Drug use.  Emotional well-being.  Home and relationship well-being.  Sexual activity.  Eating habits.  History of falls.  Memory and ability to understand (cognition).  Work and work Statistician.  Reproductive health. Screening  You may have the following tests or measurements:  Height, weight, and BMI.  Blood pressure.  Lipid and cholesterol levels. These may be checked every 5 years, or more frequently if you are over 67 years old.  Skin check.  Lung cancer screening. You may have this screening every year starting at age 3 if you have a 30-pack-year history of smoking and currently smoke or have quit within the past 15 years.  Fecal occult blood test (FOBT) of the stool. You may have this test every year  starting at age 21.  Flexible sigmoidoscopy or colonoscopy. You may have a sigmoidoscopy  every 5 years or a colonoscopy every 10 years starting at age 67.  Hepatitis C blood test.  Hepatitis B blood test.  Sexually transmitted disease (STD) testing.  Diabetes screening. This is done by checking your blood sugar (glucose) after you have not eaten for a while (fasting). You may have this done every 1-3 years.  Bone density scan. This is done to screen for osteoporosis. You may have this done starting at age 86.  Mammogram. This may be done every 1-2 years. Talk to your health care provider about how often you should have regular mammograms. Talk with your health care provider about your test results, treatment options, and if necessary, the need for more tests. Vaccines  Your health care provider may recommend certain vaccines, such as:  Influenza vaccine. This is recommended every year.  Tetanus, diphtheria, and acellular pertussis (Tdap, Td) vaccine. You may need a Td booster every 10 years.  Zoster vaccine. You may need this after age 34.  Pneumococcal 13-valent conjugate (PCV13) vaccine. One dose is recommended after age 21.  Pneumococcal polysaccharide (PPSV23) vaccine. One dose is recommended after age 82. Talk to your health care provider about which screenings and vaccines you need and how often you need them. This information is not intended to replace advice given to you by your health care provider. Make sure you discuss any questions you have with your health care provider. Document Released: 07/06/2015 Document Revised: 02/27/2016 Document Reviewed: 04/10/2015 Elsevier Interactive Patient Education  2017 [place] Prevention in the Home Falls can cause injuries. They can happen to people of all ages. There are many things you can do to make your home safe and to help prevent falls. What can I do on the outside of my home?  Regularly fix the edges of walkways and driveways and fix any cracks.  Remove anything that might make you trip as you walk  through a door, such as a raised step or threshold.  Trim any bushes or trees on the path to your home.  Use bright outdoor lighting.  Clear any walking paths of anything that might make someone trip, such as rocks or tools.  Regularly check to see if handrails are loose or broken. Make sure that both sides of any steps have handrails.  Any raised decks and porches should have guardrails on the edges.  Have any leaves, snow, or ice cleared regularly.  Use sand or salt on walking paths during winter.  Clean up any spills in your garage right away. This includes oil or grease spills. What can I do in the bathroom?  Use night lights.  Install grab bars by the toilet and in the tub and shower. Do not use towel bars as grab bars.  Use non-skid mats or decals in the tub or shower.  If you need to sit down in the shower, use a plastic, non-slip stool.  Keep the floor dry. Clean up any water that spills on the floor as soon as it happens.  Remove soap buildup in the tub or shower regularly.  Attach bath mats securely with double-sided non-slip rug tape.  Do not have throw rugs and other things on the floor that can make you trip. What can I do in the bedroom?  Use night lights.  Make sure that you have a light by your bed that is easy to reach.  Do not  use any sheets or blankets that are too big for your bed. They should not hang down onto the floor.  Have a firm chair that has side arms. You can use this for support while you get dressed.  Do not have throw rugs and other things on the floor that can make you trip. What can I do in the kitchen?  Clean up any spills right away.  Avoid walking on wet floors.  Keep items that you use a lot in easy-to-reach places.  If you need to reach something above you, use a strong step stool that has a grab bar.  Keep electrical cords out of the way.  Do not use floor polish or wax that makes floors slippery. If you must use wax,  use non-skid floor wax.  Do not have throw rugs and other things on the floor that can make you trip. What can I do with my stairs?  Do not leave any items on the stairs.  Make sure that there are handrails on both sides of the stairs and use them. Fix handrails that are broken or loose. Make sure that handrails are as long as the stairways.  Check any carpeting to make sure that it is firmly attached to the stairs. Fix any carpet that is loose or worn.  Avoid having throw rugs at the top or bottom of the stairs. If you do have throw rugs, attach them to the floor with carpet tape.  Make sure that you have a light switch at the top of the stairs and the bottom of the stairs. If you do not have them, ask someone to add them for you. What else can I do to help prevent falls?  Wear shoes that:  Do not have high heels.  Have rubber bottoms.  Are comfortable and fit you well.  Are closed at the toe. Do not wear sandals.  If you use a stepladder:  Make sure that it is fully opened. Do not climb a closed stepladder.  Make sure that both sides of the stepladder are locked into place.  Ask someone to hold it for you, if possible.  Clearly mark and make sure that you can see:  Any grab bars or handrails.  First and last steps.  Where the edge of each step is.  Use tools that help you move around (mobility aids) if they are needed. These include:  Canes.  Walkers.  Scooters.  Crutches.  Turn on the lights when you go into a dark area. Replace any light bulbs as soon as they burn out.  Set up your furniture so you have a clear path. Avoid moving your furniture around.  If any of your floors are uneven, fix them.  If there are any pets around you, be aware of where they are.  Review your medicines with your doctor. Some medicines can make you feel dizzy. This can increase your chance of falling. Ask your doctor what other things that you can do to help prevent  falls. This information is not intended to replace advice given to you by your health care provider. Make sure you discuss any questions you have with your health care provider. Document Released: 04/05/2009 Document Revised: 11/15/2015 Document Reviewed: 07/14/2014 Elsevier Interactive Patient Education  2017 [employer].

## 2020-09-25 NOTE — Progress Notes (Addendum)
I connected with Christine Morales today by telephone and verified that I am speaking with the correct person using two identifiers. Location patient: home Location provider: work Persons participating in the virtual visit: Jolane Bankhead and [employer], LPN.   I discussed the limitations, risks, security and privacy concerns of performing an evaluation and management service by telephone and the availability of in person appointments. I also discussed with the patient that there may be a patient responsible charge related to this service. The patient expressed understanding and verbally consented to this telephonic visit.    Interactive audio and video telecommunications were attempted between this provider and patient, however failed, due to patient having technical difficulties OR patient did not have access to video capability.  We continued and completed visit with audio only.  Some vital signs may be absent or patient reported.   Time Spent with patient on telephone encounter: 30 minutes  Subjective:   Christine Morales is a 75 y.o. female who presents for Medicare Annual (Subsequent) preventive examination.  Review of Systems    No ROS. Medicare Wellness Visit. Additional risk factors are reflected in social history. Cardiac Risk Factors include: advanced age (>28men, >74 women);dyslipidemia;hypertension;family history of premature cardiovascular disease     Objective:    There were no vitals filed for this visit. There is no height or weight on file to calculate BMI.  Advanced Directives 09/25/2020 08/31/2019 12/15/2017 11/18/2016 05/22/2016 08/30/2015 01/30/2015  Does Patient Have a Medical Advance Directive? Yes No Yes No No No No  Type of Advance Directive Living will;Healthcare Power of [place];Out of facility DNR (pink MOST or yellow form) - [place]  Does patient want to make changes to medical advance directive? No - Patient declined - No - Patient declined  - - - -  Copy of [place] in Chart? No - copy requested - No - copy requested - - - -  Would patient like information on creating a medical advance directive? - No - Patient declined - - No - Patient declined No - patient declined information No - patient declined information    Current Medications (verified) Outpatient Encounter Medications as of 09/25/2020  Medication Sig   amLODipine (NORVASC) 5 MG tablet TAKE 1 TABLET BY MOUTH EVERY DAY   Aspirin-Salicylamide-Caffeine (BC HEADACHE PO) Take 1 packet by mouth daily as needed (headache).    Cholecalciferol (VITAMIN D3) 1000 UNITS tablet Take 1,000 Units by mouth daily.   Cyanocobalamin (VITAMIN B 12 PO) Take 1,000 mcg by mouth daily.   ferrous sulfate 325 (65 FE) MG tablet Take 325 mg by mouth daily with breakfast.   levothyroxine (SYNTHROID) 100 MCG tablet TAKE 1 TABLET BY MOUTH EVERY DAY   losartan (COZAAR) 100 MG tablet Take 1 tablet (100 mg total) by mouth daily.   Facility-Administered Encounter Medications as of 09/25/2020  Medication   heparin lock flush 100 unit/mL   sodium chloride 0.9 % injection 10 mL   sodium chloride 0.9 % injection 10 mL    Allergies (verified) Patient has no known allergies.   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  partial SBO      Rhinitis, allergic    Thyroid disease    Ulcer    peptic   Past Surgical History:  Procedure Laterality Date   ABDOMINAL HYSTERECTOMY     BREAST REDUCTION SURGERY     COLON SURGERY     partial colectomy   COLONOSCOPY  07/2006   IR REMOVAL TUN ACCESS W/ PORT W/O FL MOD SED  12/15/2017   PARTIAL COLECTOMY     dr blackmon   TUBAL LIGATION     Family History  Problem Relation Age of  Onset   Cancer Father    Heart disease Sister    Diabetes Brother    Hypertension Other        FH   Cholelithiasis Neg Hx    Colon cancer Neg Hx    Colon polyps Neg Hx    Esophageal cancer Neg Hx    Stomach cancer Neg Hx    Rectal cancer Neg Hx    Social History   Socioeconomic History   Marital status: Widowed    Spouse name: Not on file   Number of children: 2   Years of education: Not on file   Highest education level: Not on file  Occupational History   Occupation: retired    Fish farm manager: [employer]  Tobacco Use   Smoking status: Never Smoker   Smokeless tobacco: Never Used  Scientific laboratory technician Use: Never used  Substance and Sexual Activity   Alcohol use: No    Alcohol/week: 0.0 standard drinks   Drug use: No   Sexual activity: Not Currently    Comment: intercourse age 17, less than 5 sexual partners, des neg  Other Topics Concern   Not on file  Social History Narrative   Live with spouse; have 2 children;   Lives in the area   [place] children x 5;    Social Determinants of Health   Financial Resource Strain: Low Risk    Difficulty of Paying Living Expenses: Not hard at all  Food Insecurity: No Food Insecurity   Worried About Charity fundraiser in the Last Year: Never true   Arboriculturist in the Last Year: Never true  Transportation Needs: No Transportation Needs   Lack of Transportation (Medical): No   Lack of Transportation (Non-Medical): No  Physical Activity: Sufficiently Active   Days of Exercise per Week: 5 days   Minutes of Exercise per Session: 30 min  Stress: No Stress Concern Present   Feeling of Stress : Not at all  Social Connections: Unknown   Frequency of Communication with Friends and Family: More than three times a week   Frequency of Social Gatherings with Friends and Family: More than three times a week   Attends Religious Services: More than 4 times per year   Active Member of [employer] or Organizations: No   Attends Programme researcher, broadcasting/film/video: More than 4 times per year   Marital Status: Not on file    Tobacco Counseling Counseling given: Not Answered   Clinical Intake:  Pre-visit preparation completed: Yes  Pain : No/denies pain     Nutritional Risks: None Diabetes: No  How often do you need to have someone help you when you read instructions, pamphlets, or other written materials from your doctor or pharmacy?: 1 - Never What is the last grade level you completed in school?: 1 year at [employer]  Diabetic? no  Interpreter Needed?: No  Information  entered by :: Lisette Abu, LPN   Activities of Daily Living In your present state of health, do you have any difficulty performing the following activities: 09/25/2020 09/25/2020  Hearing? N N  Vision? N N  Difficulty concentrating or making decisions? N N  Walking or climbing stairs? N N  Dressing or bathing? N N  Doing errands, shopping? N N  Preparing Food and eating ? N N  Using the Toilet? N N  In the past six months, have you accidently leaked urine? N N  Do you have problems with loss of bowel control? N N  Managing your Medications? N N  Managing your Finances? N N  Housekeeping or managing your Housekeeping? N N  Some recent data might be hidden    Patient Care Team: Plotnikov, Evie Lacks, MD as PCP - General (Internal Medicine) Wyatt Portela, MD (Hematology and Oncology) Coralie Keens, MD (General Surgery) Irene Shipper, MD as Referring Physician (Gastroenterology) Garrel Ridgel, DPM as Consulting Physician (Podiatry)  Indicate any recent Medical Services you may have received from other than [facility] providers in the past year (date may be approximate).     Assessment:   This is a routine wellness examination for Alec.  Hearing/Vision screen No exam data present  Dietary issues and exercise activities discussed: Current Exercise Habits: Home exercise routine, Type of exercise: walking, Time (Minutes): 30, Frequency  (Times/Week): 5, Weekly Exercise (Minutes/Week): 150, Intensity: Moderate, Exercise limited by: None identified  Goals      Exercise 150 minutes per week (moderate activity)     Is thinking of going to the pool; trying water aerobics to lose weight around waist       Depression Screen PHQ 2/9 Scores 09/25/2020 08/20/2020 07/18/2019 06/24/2017 01/30/2015 03/09/2014  PHQ - 2 Score 0 0 0 0 0 0  PHQ- 9 Score 0 0 - - - -    Fall Risk Fall Risk  09/25/2020 08/20/2020 07/18/2019 06/24/2017 01/30/2015  Falls in the past year? 0 1 0 No No  Number falls in past yr: 0 0 - - -  Injury with Fall? 0 1 - - -  Risk for fall due to : No Fall Risks Impaired balance/gait - - -  Risk for fall due to: Comment - - - - -  Follow up Falls evaluation completed - Falls evaluation completed - -    FALL RISK PREVENTION PERTAINING TO THE HOME:  Any stairs in or around the home? No  If so, are there any without handrails? No  Home free of loose throw rugs in walkways, pet beds, electrical cords, etc? Yes  Adequate lighting in your home to reduce risk of falls? Yes   ASSISTIVE DEVICES UTILIZED TO PREVENT FALLS:  Life alert? No  Use of a cane, walker or w/c? No  Grab bars in the bathroom? No  Shower chair or bench in shower? No  Elevated toilet seat or a handicapped toilet? No   TIMED UP AND GO:  Was the test performed? No .  Length of time to ambulate 10 feet: 0 sec.   Gait steady and fast without use of assistive device  Cognitive Function: MMSE - Mini Mental State Exam 01/30/2015  Not completed: Unable to complete        Immunizations Immunization History  Administered Date(s) Administered   Fluad Quad(high Dose 65+) 03/23/2020   Influenza, High Dose Seasonal PF 04/16/2015, 02/27/2016, 02/27/2016, 03/30/2017, 03/20/2019   Influenza,inj,Quad PF,6+ Mos 03/16/2014, 04/05/2018  PFIZER(Purple Top)SARS-COV-2 Vaccination 08/19/2019, 09/13/2019, 04/21/2020   Pneumococcal Conjugate-13 10/21/2017   Pneumococcal  Polysaccharide-23 09/06/2012   Td 02/12/2015    TDAP status: Up to date  Flu Vaccine status: Up to date  Pneumococcal vaccine status: Up to date  Covid-19 vaccine status: Completed vaccines  Qualifies for Shingles Vaccine? Yes   Zostavax completed No   Shingrix Completed?: No.    Education has been provided regarding the importance of this vaccine. Patient has been advised to call insurance company to determine out of pocket expense if they have not yet received this vaccine. Advised may also receive vaccine at local pharmacy or Health Dept. Verbalized acceptance and understanding.  Screening Tests Health Maintenance  Topic Date Due   Hepatitis C Screening  Never done   PNA vac Low Risk Adult (2 of 2 - PPSV23) 10/22/2018   COVID-19 Vaccine (4 - Booster for Pfizer series) 10/20/2020   INFLUENZA VACCINE  01/21/2021   MAMMOGRAM  04/12/2021   TETANUS/TDAP  02/11/2025   DEXA SCAN  Completed   HPV VACCINES  Aged Out    Health Maintenance  Health Maintenance Due  Topic Date Due   Hepatitis C Screening  Never done   PNA vac Low Risk Adult (2 of 2 - PPSV23) 10/22/2018    Colorectal cancer screening: No longer required.   Mammogram status: Completed 04/13/2020. Repeat every year  Bone Density status: Completed 04/24/2020. Results reflect: Bone density results: NORMAL. Repeat every n/a years.  Lung Cancer Screening: (Low Dose CT Chest recommended if Age 71-80 years, 30 pack-year currently smoking OR have quit w/in 15years.) does not qualify.   Lung Cancer Screening Referral: no  Additional Screening:  Hepatitis C Screening: does qualify; Completed no  Vision Screening: Recommended annual ophthalmology exams for early detection of glaucoma and other disorders of the eye. Is the patient up to date with their annual eye exam?  Yes  Who is the provider or what is the name of the office in which the patient attends annual eye exams? [facility] If pt is not established with a  provider, would they like to be referred to a provider to establish care? No .   Dental Screening: Recommended annual dental exams for proper oral hygiene  Community Resource Referral / Chronic Care Management: CRR required this visit?  No   CCM required this visit?  No      Plan:     I have personally reviewed and noted the following in the patient's chart:   Medical and social history Use of alcohol, tobacco or illicit drugs  Current medications and supplements Functional ability and status Nutritional status Physical activity Advanced directives List of other physicians Hospitalizations, surgeries, and ER visits in previous 12 months Vitals Screenings to include cognitive, depression, and falls Referrals and appointments  In addition, I have reviewed and discussed with patient certain preventive protocols, quality metrics, and best practice recommendations. A written personalized care plan for preventive services as well as general preventive health recommendations were provided to patient.     Sheral Flow, LPN   06/28/1094   Nurse Notes:  Patient is cogitatively intact. There were no vitals filed for this visit. There is no height or weight on file to calculate BMI. Patient stated that she has no issues with gait or balance; does not use any assistive devices. Medications reviewed with patient; no opioid use noted.   Medical screening examination/treatment/procedure(s) were performed by non-physician practitioner and as supervising physician I was  immediately available for consultation/collaboration.  I agree with above. Lew Dawes, MD

## 2020-09-29 ENCOUNTER — Other Ambulatory Visit: Payer: Self-pay | Admitting: Internal Medicine

## 2020-10-23 ENCOUNTER — Ambulatory Visit: Payer: Medicare PPO | Admitting: Internal Medicine

## 2020-11-05 ENCOUNTER — Other Ambulatory Visit: Payer: Self-pay

## 2020-11-05 ENCOUNTER — Ambulatory Visit: Payer: Medicare PPO | Admitting: Internal Medicine

## 2020-11-05 DIAGNOSIS — M7989 Other specified soft tissue disorders: Secondary | ICD-10-CM | POA: Insufficient documentation

## 2020-11-05 NOTE — Assessment & Plan Note (Addendum)
A ganglion vs synovial swelling L hand - resolving Voltaren gel prn Please come back if worse/reoccurred

## 2020-11-05 NOTE — Progress Notes (Signed)
Subjective:  Patient ID: Christine Morales, female    DOB: 11-08-1945  Age: 75 y.o. MRN: [id number]  CC: Hand Pain (Pt states she has a bump on (L) hand)   HPI [name] presents for L dorsal hand swelling - it was a quarter size or less, sensitive, now subsided  Outpatient Medications Prior to Visit  Medication Sig Dispense Refill  . amLODipine (NORVASC) 5 MG tablet Take 1 tablet (5 mg total) by mouth daily. Annual appt due in July must see provider for future refills 90 tablet 0  . Aspirin-Salicylamide-Caffeine (BC HEADACHE PO) Take 1 packet by mouth daily as needed (headache).     . Cholecalciferol (VITAMIN D3) 1000 UNITS tablet Take 1,000 Units by mouth daily.    . Cyanocobalamin (VITAMIN B 12 PO) Take 1,000 mcg by mouth daily.    . ferrous sulfate 325 (65 FE) MG tablet Take 325 mg by mouth daily with breakfast.    . levothyroxine (SYNTHROID) 100 MCG tablet TAKE 1 TABLET BY MOUTH EVERY DAY 90 tablet 3  . losartan (COZAAR) 100 MG tablet Take 1 tablet (100 mg total) by mouth daily. Annual appt due in July must see provider for future refills 90 tablet 0   Facility-Administered Medications Prior to Visit  Medication Dose Route Frequency Provider Last Rate Last Admin  . heparin lock flush 100 unit/mL  500 Units Intravenous Once Shadad, Firas N, MD      . sodium chloride 0.9 % injection 10 mL  10 mL Intravenous PRN Wyatt Portela, MD      . sodium chloride 0.9 % injection 10 mL  10 mL Intravenous PRN Wyatt Portela, MD   10 mL at 05/20/17 0956    ROS: Review of Systems  Constitutional: Negative for chills, fatigue and fever.  Musculoskeletal: Negative for arthralgias.  Skin: Negative for color change, rash and wound.  Hematological: Does not bruise/bleed easily.    Objective:  There were no vitals taken for this visit.  BP Readings from Last 3 Encounters:  09/18/20 120/74  08/20/20 118/70  04/11/20 109/68    Wt Readings from Last 3 Encounters:  09/18/20 217 lb 3.2 oz  (98.5 kg)  08/20/20 218 lb 12.8 oz (99.2 kg)  04/11/20 222 lb (100.7 kg)    Physical Exam Constitutional:      General: She is not in acute distress.    Appearance: She is well-developed. She is obese.  HENT:     Head: Normocephalic.     Right Ear: External ear normal.     Left Ear: External ear normal.     Nose: Nose normal.  Eyes:     Conjunctiva/sclera: Conjunctivae normal.     Pupils: Pupils are equal, round, and reactive to light.  Neck:     Thyroid: No thyromegaly.     Vascular: No JVD.     Trachea: No tracheal deviation.  Cardiovascular:     Rate and Rhythm: Normal rate and regular rhythm.     Heart sounds: Normal heart sounds.  Pulmonary:     Effort: No respiratory distress.  Abdominal:     General: Bowel sounds are normal. There is no distension.     Palpations: Abdomen is soft.  Musculoskeletal:        General: No tenderness.     Cervical back: Normal range of motion and neck supple.  Skin:    Findings: No erythema or rash.  Neurological:     Motor: No abnormal muscle  tone.  Psychiatric:        Behavior: Behavior normal.        Thought Content: Thought content normal.        Judgment: Judgment normal.    There is a hardly noticeable almost flat swelling on the left dorsal hand distal from the wrist joint measuring less than a dime in size, non-tender  Lab Results  Component Value Date   WBC 5.8 08/20/2020   HGB 12.8 08/20/2020   HCT 37.6 08/20/2020   PLT 247.0 08/20/2020   GLUCOSE 90 08/20/2020   CHOL 216 (H) 01/16/2020   TRIG 159 (H) 01/16/2020   HDL 57 01/16/2020   LDLCALC 132 (H) 01/16/2020   ALT 11 08/20/2020   AST 16 08/20/2020   NA 142 08/20/2020   K 4.4 08/20/2020   CL 106 08/20/2020   CREATININE 1.70 (H) 08/20/2020   BUN 21 08/20/2020   CO2 27 08/20/2020   TSH 1.82 01/16/2020   INR 0.94 12/15/2017   HGBA1C 5.8 (H) 01/16/2020    US Abdomen Complete  Result Date: 09/05/2020 CLINICAL DATA:  Right flank pain x4 months EXAM: ABDOMEN  ULTRASOUND COMPLETE COMPARISON:  CT abdomen pelvis July 23, 2015. FINDINGS: Gallbladder: No gallstones or wall thickening visualized. No sonographic Murphy sign noted by sonographer. Common bile duct: Diameter: 5 mm Liver: No focal lesion identified. Diffusely increased parenchymal echogenicity. Portal vein is patent on color Doppler imaging with normal direction of blood flow towards the liver. IVC: No abnormality visualized. Pancreas: Obscured by bowel gas. Spleen: Size and appearance within normal limits. Right Kidney: Length: 9.3 cm. Echogenicity within normal limits. No hydronephrosis. 8 mm complex interpolar cyst. Left Kidney: Length: 9.6 cm. Echogenicity within normal limits. No mass or hydronephrosis visualized. Abdominal aorta: No aneurysm visualized. Other findings: None. IMPRESSION: 1. The echogenicity of the liver is increased. This is a nonspecific finding but is most commonly seen with fatty infiltration of the liver. There are no obvious focal liver lesions. 2. Complex 8 mm right interpolar cyst, this could be further characterized with renal protocol MRI with and without contrast if clinically indicated. Electronically Signed   By: Dahlia Bailiff MD   On: 09/05/2020 11:01    Assessment & Plan:   There are no diagnoses linked to this encounter.   No orders of the defined types were placed in this encounter.    Follow-up: No follow-ups on file.  Walker Kehr, MD

## 2020-11-05 NOTE — Patient Instructions (Signed)
Voltaren gel 

## 2020-11-06 ENCOUNTER — Encounter: Payer: Self-pay | Admitting: Internal Medicine

## 2020-11-12 ENCOUNTER — Ambulatory Visit
Admission: RE | Admit: 2020-11-12 | Discharge: 2020-11-12 | Disposition: A | Discharge status: Home / Self Care | Payer: Medicare PPO | Source: Ambulatory Visit | Attending: Internal Medicine | Admitting: Internal Medicine

## 2020-11-12 DIAGNOSIS — N281 Cyst of kidney, acquired: Secondary | ICD-10-CM

## 2020-11-12 IMAGING — US US RENAL
1 series · 14 of 25 positions shown · non-contrast
Comparison: Ultrasound abdomen complete [DATE]

CLINICAL DATA: Follow-up 8 mm right renal cyst.

EXAM:
RENAL / URINARY TRACT ULTRASOUND COMPLETE

[Series 1: us renal · 0.23mm/px · 14 of 27 slices shown]
[im 1/27]
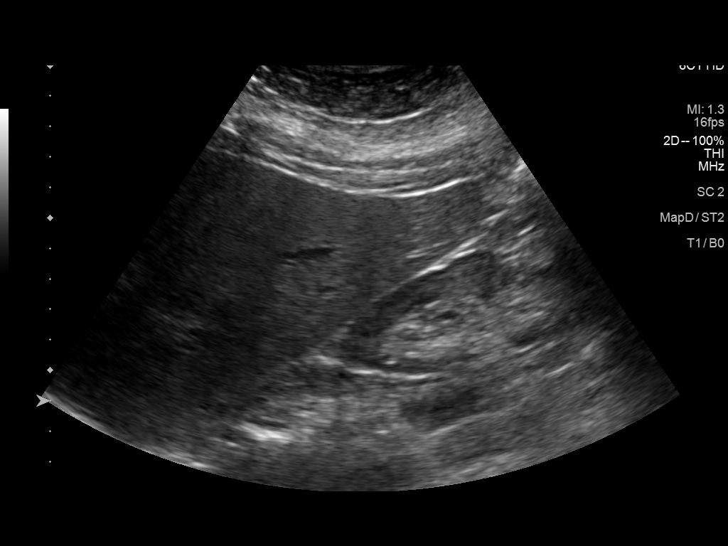
[im 3/27]
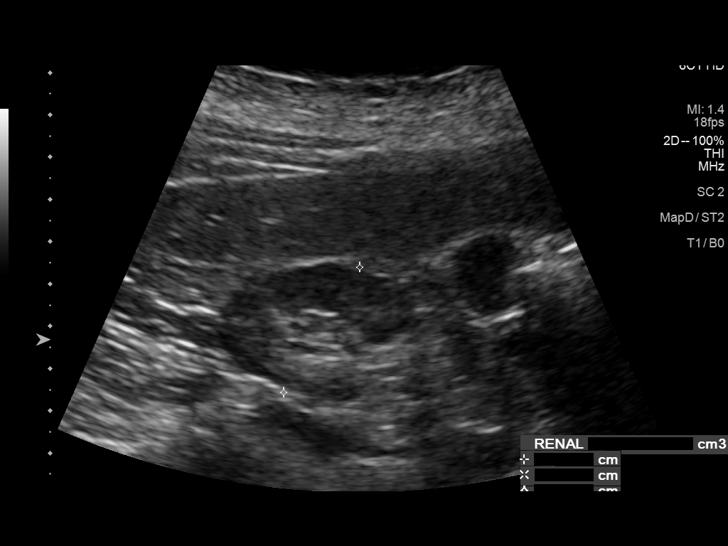
[im 5/27]
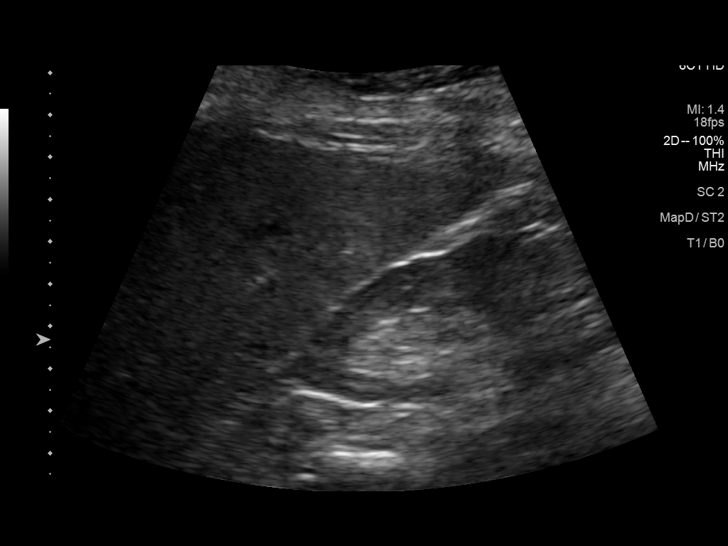
[im 7/27]
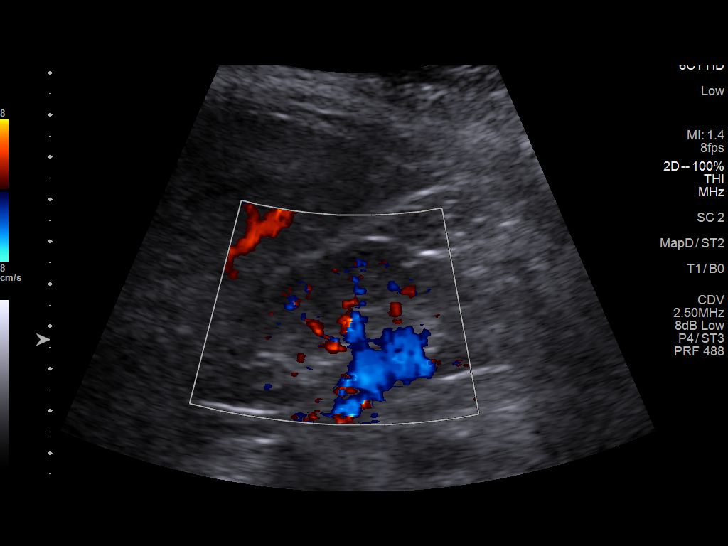
[im 9/27]
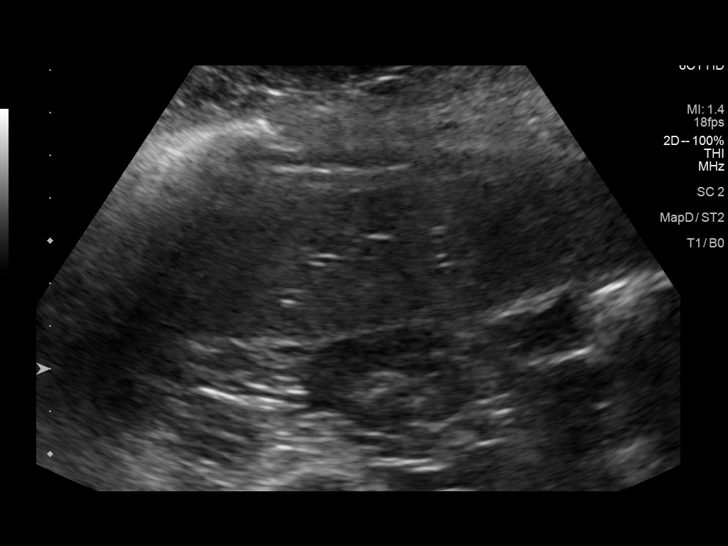
[im 10/27]
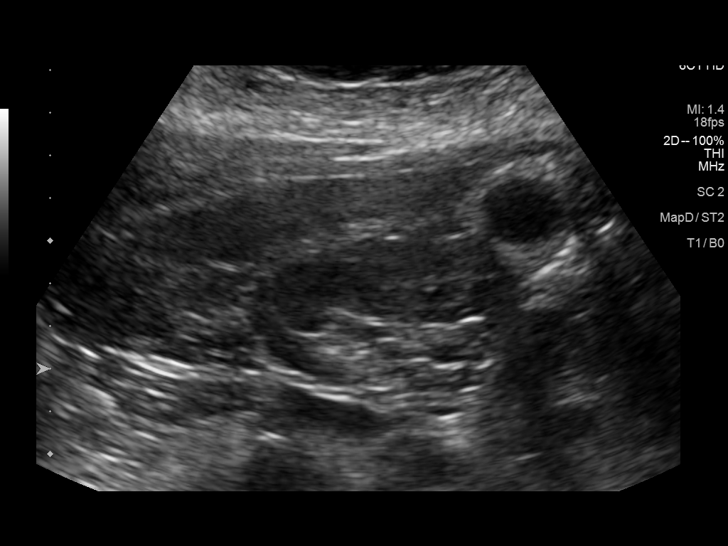
[im 12/27]
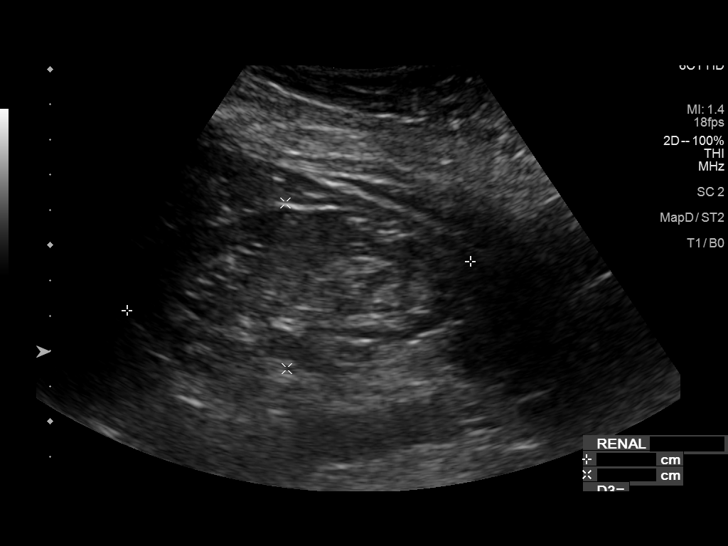
[im 15/27]
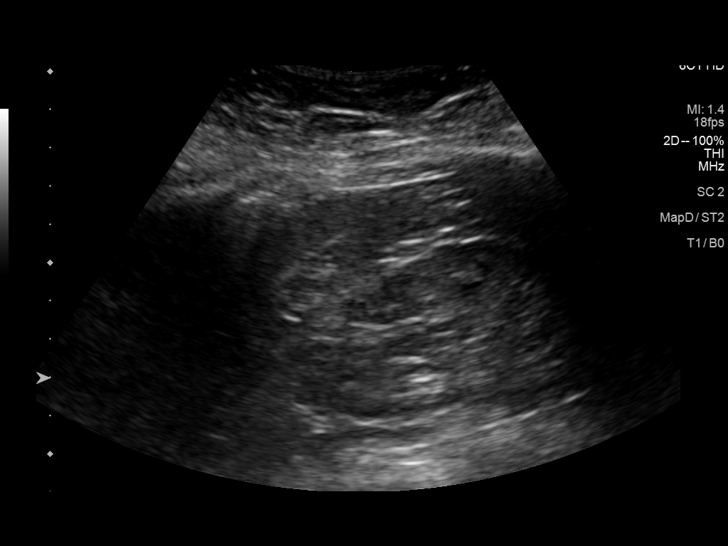
[im 17/27]
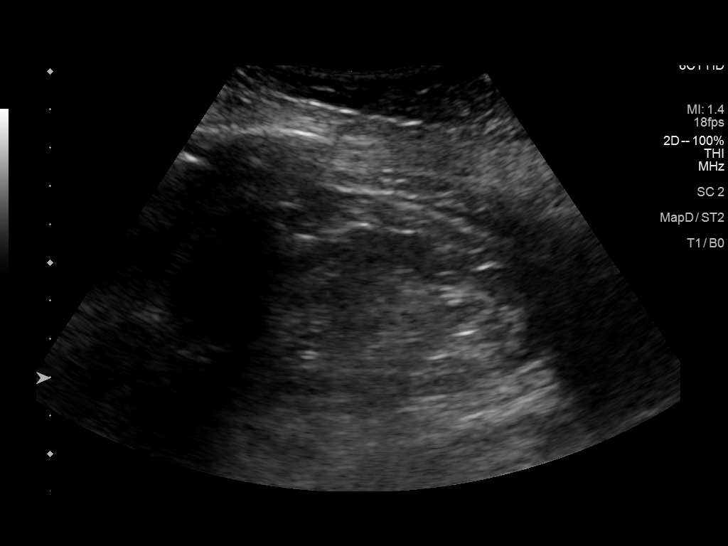
[im 18/27]
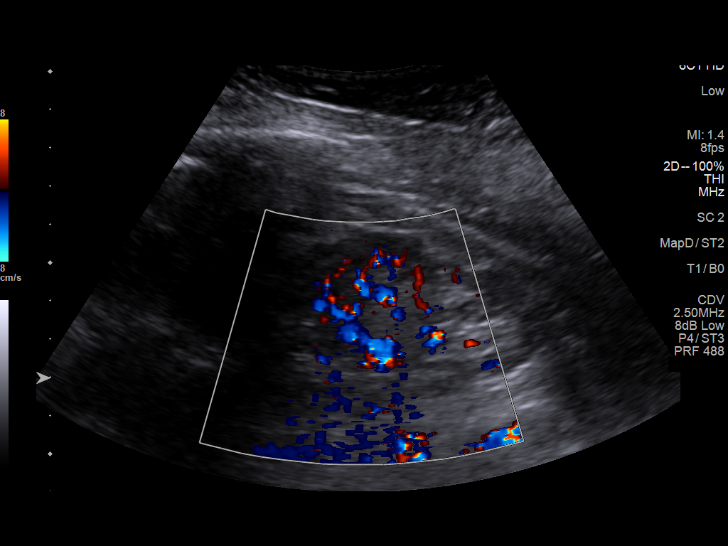
[im 20/27]
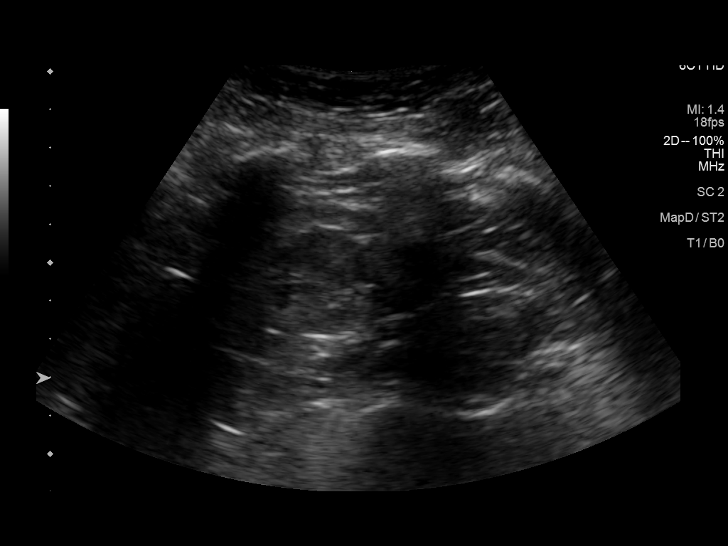
[im 22/27]
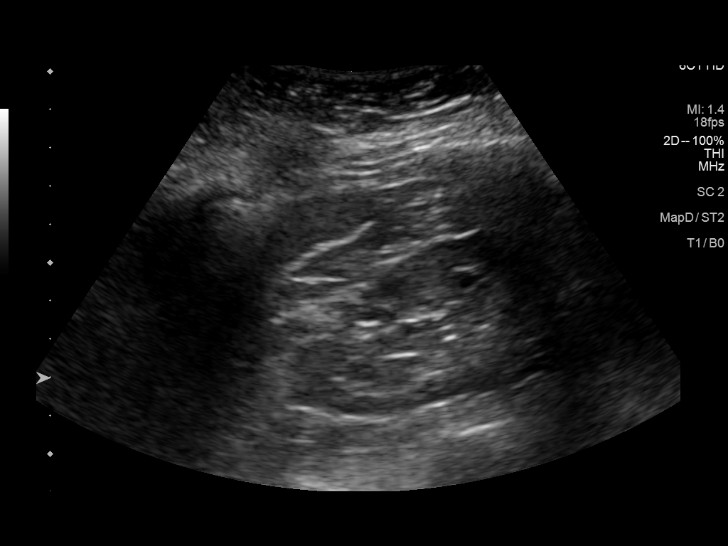
[im 24/27]
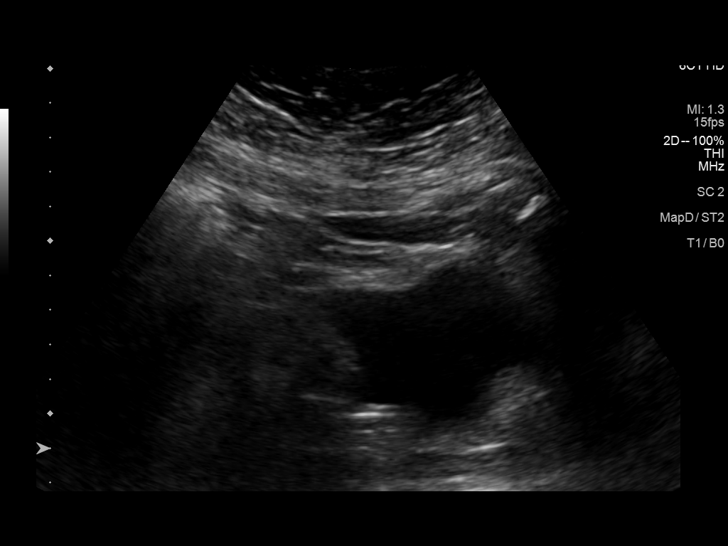
[im 27/27]
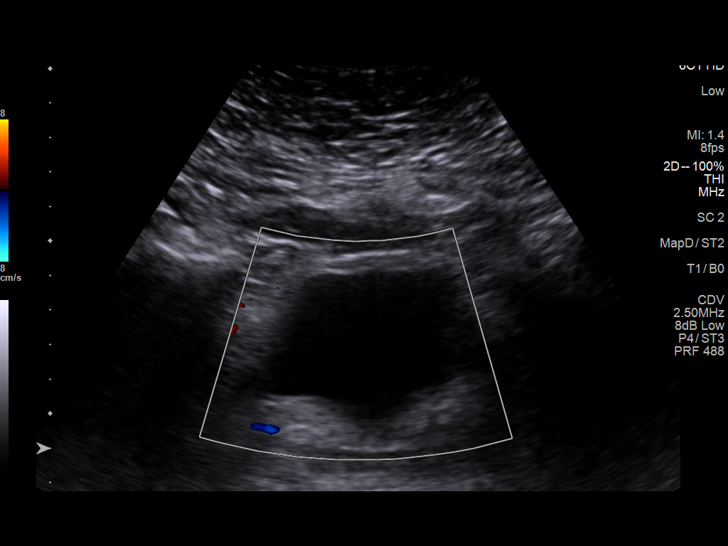

[14 of 25 positions shown; findings below may reference images not displayed]

FINDINGS: Right Kidney:

Renal measurements: 9.5 x 3.6 x 3.5 cm = volume: 62 mL. Echogenicity
within normal limits. No mass or hydronephrosis visualized.

Left Kidney:

Renal measurements: 9.8 x 4.7 x 5.1 cm = volume: 125 mL.
Echogenicity within normal limits. No mass or hydronephrosis
visualized.

Bladder:

Appears normal for degree of bladder distention.

Other:

None.
IMPRESSION: Previously described right renal lesion is not seen on today's
examination. No suspicious renal lesions identified on the current
ultrasound.

## 2020-12-17 ENCOUNTER — Other Ambulatory Visit: Payer: Self-pay | Admitting: Internal Medicine

## 2021-01-06 ENCOUNTER — Encounter (HOSPITAL_COMMUNITY): Payer: Self-pay | Admitting: Emergency Medicine

## 2021-01-06 ENCOUNTER — Emergency Department (HOSPITAL_COMMUNITY): Payer: Medicare PPO

## 2021-01-06 ENCOUNTER — Other Ambulatory Visit: Payer: Self-pay

## 2021-01-06 ENCOUNTER — Emergency Department (HOSPITAL_COMMUNITY)
Admission: EM | Admit: 2021-01-06 | Discharge: 2021-01-07 | Disposition: A | Discharge status: Home / Self Care | Payer: Medicare PPO | Attending: Emergency Medicine | Admitting: Emergency Medicine

## 2021-01-06 DIAGNOSIS — R29810 Facial weakness: Secondary | ICD-10-CM | POA: Diagnosis not present

## 2021-01-06 DIAGNOSIS — U071 COVID-19: Secondary | ICD-10-CM | POA: Diagnosis not present

## 2021-01-06 DIAGNOSIS — R42 Dizziness and giddiness: Secondary | ICD-10-CM | POA: Diagnosis not present

## 2021-01-06 DIAGNOSIS — I6782 Cerebral ischemia: Secondary | ICD-10-CM | POA: Insufficient documentation

## 2021-01-06 DIAGNOSIS — Z79899 Other long term (current) drug therapy: Secondary | ICD-10-CM | POA: Insufficient documentation

## 2021-01-06 DIAGNOSIS — I10 Essential (primary) hypertension: Secondary | ICD-10-CM | POA: Insufficient documentation

## 2021-01-06 DIAGNOSIS — R109 Unspecified abdominal pain: Secondary | ICD-10-CM | POA: Diagnosis not present

## 2021-01-06 DIAGNOSIS — C8590 Non-Hodgkin lymphoma, unspecified, unspecified site: Secondary | ICD-10-CM | POA: Diagnosis not present

## 2021-01-06 DIAGNOSIS — R112 Nausea with vomiting, unspecified: Secondary | ICD-10-CM | POA: Diagnosis not present

## 2021-01-06 DIAGNOSIS — R059 Cough, unspecified: Secondary | ICD-10-CM | POA: Diagnosis not present

## 2021-01-06 DIAGNOSIS — E039 Hypothyroidism, unspecified: Secondary | ICD-10-CM | POA: Insufficient documentation

## 2021-01-06 DIAGNOSIS — R1110 Vomiting, unspecified: Secondary | ICD-10-CM | POA: Diagnosis not present

## 2021-01-06 DIAGNOSIS — B342 Coronavirus infection, unspecified: Secondary | ICD-10-CM | POA: Diagnosis not present

## 2021-01-06 DIAGNOSIS — R1084 Generalized abdominal pain: Secondary | ICD-10-CM | POA: Diagnosis not present

## 2021-01-06 LAB — CBC WITH DIFFERENTIAL/PLATELET
Abs Immature Granulocytes: 0.01 10*3/uL (ref 0.00–0.07)
Basophils Absolute: 0 10*3/uL (ref 0.0–0.1)
Basophils Relative: 0 %
Eosinophils Absolute: 0 10*3/uL (ref 0.0–0.5)
Eosinophils Relative: 0 %
HCT: 36.8 % (ref 36.0–46.0)
Hemoglobin: 12.1 g/dL (ref 12.0–15.0)
Immature Granulocytes: 0 %
Lymphocytes Relative: 27 %
Lymphs Abs: 1.2 10*3/uL (ref 0.7–4.0)
MCH: 28.9 pg (ref 26.0–34.0)
MCHC: 32.9 g/dL (ref 30.0–36.0)
MCV: 87.8 fL (ref 80.0–100.0)
Monocytes Absolute: 0.5 10*3/uL (ref 0.1–1.0)
Monocytes Relative: 10 %
Neutro Abs: 2.7 10*3/uL (ref 1.7–7.7)
Neutrophils Relative %: 63 %
Platelets: 212 10*3/uL (ref 150–400)
RBC: 4.19 MIL/uL (ref 3.87–5.11)
RDW: 14.1 % (ref 11.5–15.5)
WBC: 4.4 10*3/uL (ref 4.0–10.5)
nRBC: 0 % (ref 0.0–0.2)

## 2021-01-06 LAB — COMPREHENSIVE METABOLIC PANEL
ALT: 15 U/L (ref 0–44)
AST: 22 U/L (ref 15–41)
Albumin: 3.7 g/dL (ref 3.5–5.0)
Alkaline Phosphatase: 59 U/L (ref 38–126)
Anion gap: 9 (ref 5–15)
BUN: 30 mg/dL — ABNORMAL HIGH (ref 8–23)
CO2: 24 mmol/L (ref 22–32)
Calcium: 9.2 mg/dL (ref 8.9–10.3)
Chloride: 106 mmol/L (ref 98–111)
Creatinine, Ser: 2.04 mg/dL — ABNORMAL HIGH (ref 0.44–1.00)
GFR, Estimated: 25 mL/min — ABNORMAL LOW (ref >60)
Glucose, Bld: 111 mg/dL — ABNORMAL HIGH (ref 70–99)
Potassium: 4.2 mmol/L (ref 3.5–5.1)
Sodium: 139 mmol/L (ref 135–145)
Total Bilirubin: 0.6 mg/dL (ref 0.3–1.2)
Total Protein: 6.7 g/dL (ref 6.5–8.1)

## 2021-01-06 IMAGING — DX DG CHEST 1V PORT
1 series · 1 of 1 positions shown · non-contrast
Comparison: [DATE]

CLINICAL DATA: Cough, dizziness, abdominal pain, emesis, [EI]
positive, history of non-Hodgkin lymphoma

EXAM:
PORTABLE CHEST 1 VIEW

[chest ap]
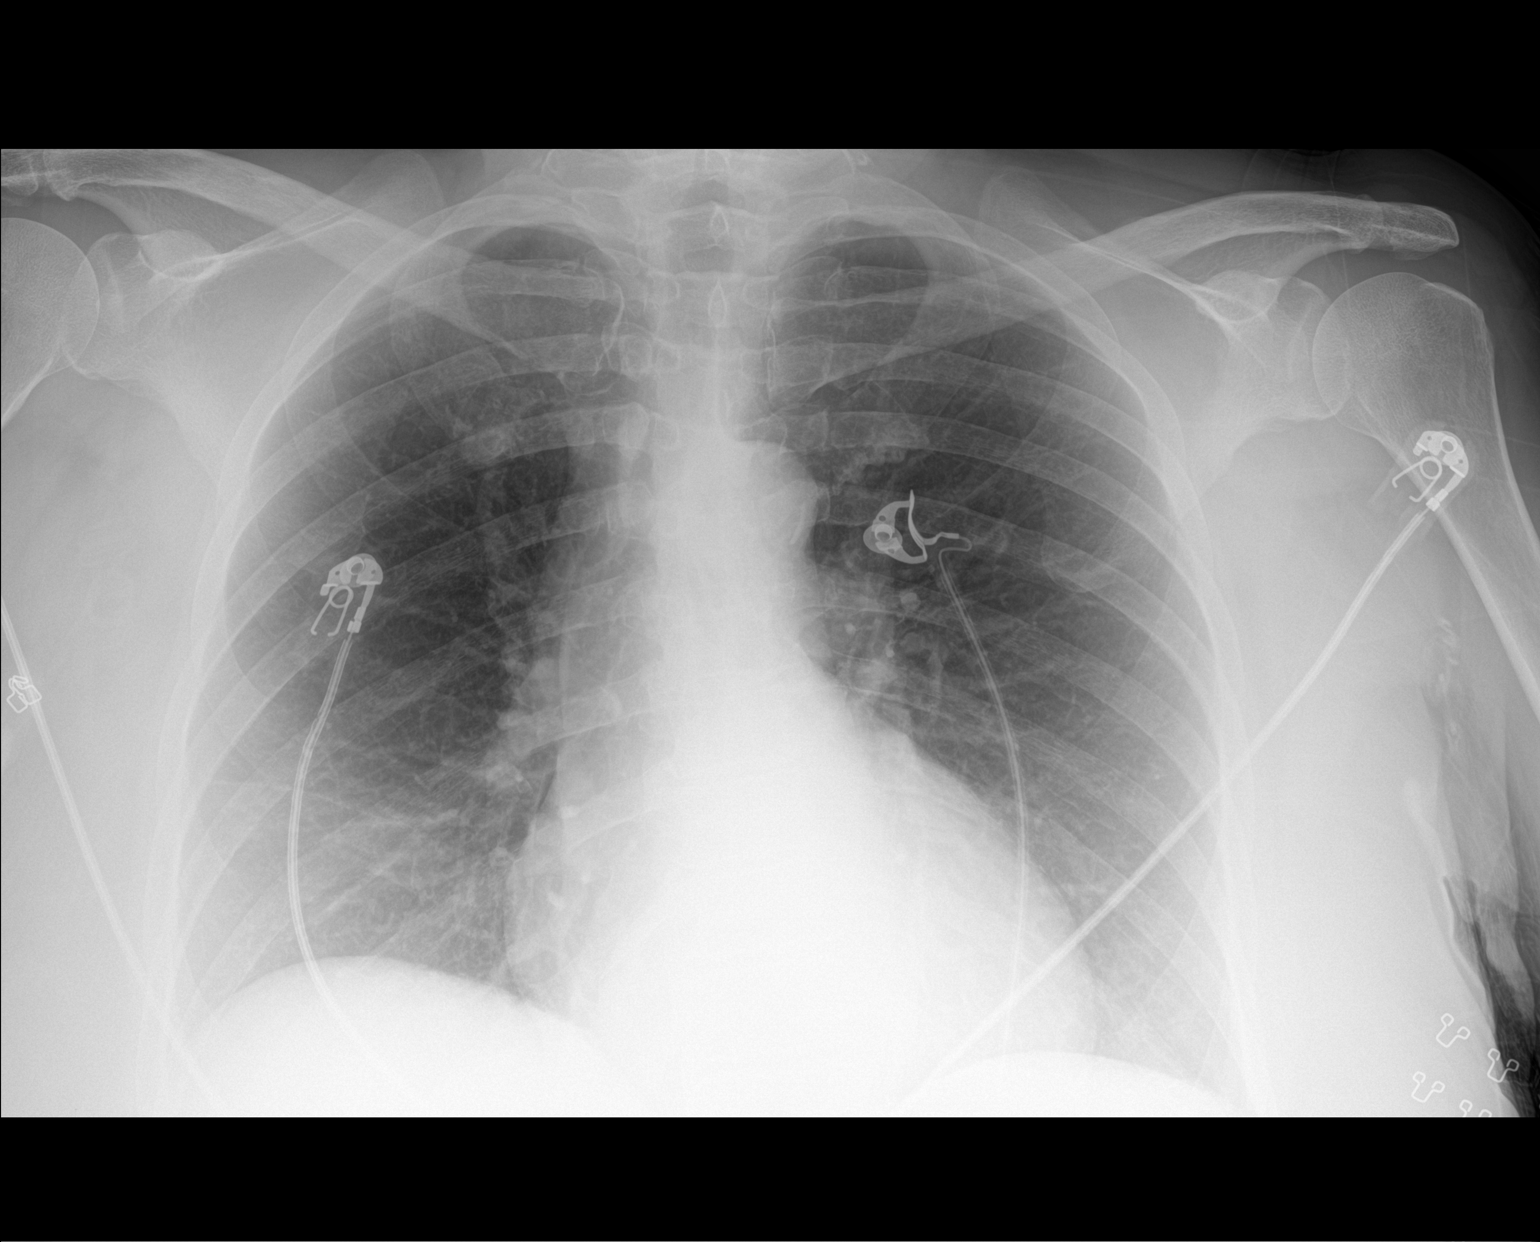

[1 of 1 positions shown; findings below may reference images not displayed]

FINDINGS: Single frontal view of the chest demonstrates an unremarkable
cardiac silhouette. No airspace disease, effusion, or pneumothorax.
No acute bony abnormalities.
IMPRESSION: 1. No acute intrathoracic process.

## 2021-01-06 MED ORDER — METHYLPREDNISOLONE SODIUM SUCC 125 MG IJ SOLR: 125.0000 mg | Freq: Once | INTRAMUSCULAR | Status: DC | PRN

## 2021-01-06 MED ORDER — SODIUM CHLORIDE 0.9 % IV SOLN: INTRAVENOUS | Status: DC | PRN

## 2021-01-06 MED ORDER — ONDANSETRON HCL 4 MG/2ML IJ SOLN
4.0000 mg | Freq: Once | INTRAMUSCULAR | Status: AC
  Administered 2021-01-06: 4 mg via INTRAVENOUS
  Filled 2021-01-06: qty 2

## 2021-01-06 MED ORDER — FAMOTIDINE IN NACL 20-0.9 MG/50ML-% IV SOLN: 20.0000 mg | Freq: Once | INTRAVENOUS | Status: DC | PRN

## 2021-01-06 MED ORDER — BEBTELOVIMAB 175 MG/2 ML IV (EUA)
175.0000 mg | Freq: Once | INTRAMUSCULAR | Status: AC
  Administered 2021-01-07: 175 mg via INTRAVENOUS
  Filled 2021-01-06: qty 2

## 2021-01-06 MED ORDER — SODIUM CHLORIDE 0.9 % IV BOLUS
500.0000 mL | Freq: Once | INTRAVENOUS | Status: AC
  Administered 2021-01-06: 500 mL via INTRAVENOUS

## 2021-01-06 MED ORDER — DIPHENHYDRAMINE HCL 50 MG/ML IJ SOLN: 50.0000 mg | Freq: Once | INTRAMUSCULAR | Status: DC | PRN

## 2021-01-06 MED ORDER — DEXAMETHASONE SODIUM PHOSPHATE 10 MG/ML IJ SOLN
8.0000 mg | Freq: Once | INTRAMUSCULAR | Status: AC
  Administered 2021-01-06: 8 mg via INTRAVENOUS
  Filled 2021-01-06: qty 1

## 2021-01-06 MED ORDER — EPINEPHRINE 0.3 MG/0.3ML IJ SOAJ: 0.3000 mg | Freq: Once | INTRAMUSCULAR | Status: DC | PRN

## 2021-01-06 MED ORDER — ALBUTEROL SULFATE HFA 108 (90 BASE) MCG/ACT IN AERS
2.0000 | INHALATION_SPRAY | Freq: Once | RESPIRATORY_TRACT | Status: DC | PRN
Start: 2021-01-06 — End: 2021-01-07

## 2021-01-06 NOTE — ED Provider Notes (Addendum)
Christine Morales is a 75 y.o. female, presenting to the ED with episode of dizziness, nausea, vomiting.  Somewhere between 4:30 PM and 5:00 PM this afternoon she began to have room spinning dizziness after eating an egg.  She thinks this episode lasted about 25 minutes. Upon my evaluation, patient states she feels generally weak, but denies any dizziness.   HPI from Debbe Mounts, PA-C: "Christine Morales is a 75 y.o. female with a history of hypertension, anemia, non-Hodgkin's lymphoma (chemo completed 2011).  Patient presents emerged department with a complaint of dizziness, nausea, and vomiting.  Patient reports that she had an episode of dizziness earlier this evening at approximately 1700.  Patient states that this started after eating a meal.  Patient felt like the entire room was spinning.  This sensation lasted for approximately 10 minutes and spontaneously resolved on its own.  Patient endorses associated nausea and vomiting.  Patient states that she vomited 3 times.  Patient describes emesis as stomach contents.  Patient denies any hematemesis or coffee-ground emesis.   Patient denies any dizziness or nausea at present.  After review patient received 4 mg Zofran with EMS.     Patient reports that she tested positive for COVID-19 on 7/12.  Patient reports that she has been having fever, chills, nonproductive cough, nasal congestion, shortness of breath, fatigue shortness of breath associated with her symptoms.  Reports that she was vaccinated for COVID-19 and received booster.   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  Constitutional:      General: She is not in acute distress.    Appearance: She is well-developed. She is not diaphoretic.  HENT:     Head: Normocephalic and atraumatic.     Mouth/Throat:     Mouth: Mucous membranes are moist.     Pharynx: Oropharynx is clear.  Eyes:     Conjunctiva/sclera: Conjunctivae normal.  Cardiovascular:     Rate and Rhythm: Normal rate and regular rhythm.     Pulses: Normal pulses.          Radial pulses are 2+ on the right side and 2+ on the left side.       Posterior tibial pulses are 2+ on the right side and 2+ on the left side.     Heart sounds: Normal heart sounds.     Comments: Tactile temperature in the extremities appropriate and equal bilaterally. Pulmonary:     Effort: Pulmonary effort is normal. No respiratory distress.     Breath sounds: Normal breath sounds.  Abdominal:     Palpations: Abdomen is soft.  Tenderness: There is no abdominal tenderness. There is no guarding.  Musculoskeletal:     Cervical back: Neck supple.     Right lower leg: No edema.     Left lower leg: No edema.  Lymphadenopathy:     Cervical: No cervical adenopathy.  Skin:    General: Skin is warm and dry.  Neurological:     Mental Status: She is alert and oriented to person, place, and time.     Comments: No noted acute cognitive deficit. Sensation grossly intact to light touch in the extremities.   Grip strengths equal bilaterally.   Strength 4/5 in all extremities.  Generally weak. Coordination intact.  Cranial nerves III-XII grossly intact.  Handles oral secretions without noted difficulty.  No noted phonation or speech deficit. No facial droop.    Psychiatric:        Mood and Affect: Mood and affect normal.        Speech: Speech normal.        Behavior: Behavior normal.    ED Course/Procedures     Procedures  Abnormal Labs Reviewed  COMPREHENSIVE METABOLIC PANEL - Abnormal; Notable for the following components:      Result Value   Glucose, Bld 111 (*)    BUN 30 (*)    Creatinine, Ser 2.04 (*)    GFR, Estimated 25 (*)    All other components within normal limits    DG Chest Portable 1 View  Result Date: 01/06/2021 CLINICAL DATA:  Cough, dizziness, abdominal pain, emesis, COVID-19 positive, history of non-Hodgkin lymphoma EXAM: PORTABLE CHEST 1 VIEW COMPARISON:  07/05/2015 FINDINGS: Single frontal view of the chest demonstrates an unremarkable cardiac silhouette. No airspace disease, effusion, or pneumothorax. No acute bony abnormalities. IMPRESSION: 1. No acute intrathoracic process. Electronically Signed   By: Randa Ngo M.D.   On: 01/06/2021 20:24       EKG Interpretation  Date/Time:  Sunday January 06 2021 19:25:35 EDT Ventricular Rate:  59 PR Interval:  189 QRS Duration: 91 QT Interval:  436 QTC Calculation: 432 R Axis:   4 Text Interpretation: Sinus rhythm Low voltage, precordial leads since last tracing no significant change Confirmed by Daleen Bo [phone]) on 01/06/2021 7:31:42 PM        MDM   Clinical Course as of 01/07/21 0048  Sun Jan 06, 2021  2157 Creatinine(!): 2.04 CrCl calculated to be 39 mL/min using Cockcroft-Gault. [SJ]  2158 Spoke with pharmacist.  We reviewed the patient's med list and compared it to medications contraindicated with Paxlovid. States Paxlovid would be contraindicated in this patient due to her amlodipine. [SJ]    Clinical Course User Index [SJ] Lorayne Bender, PA-C     Patient care handoff report received from Debbe Mounts, PA-C Plan: Review patient's labs and work-up.  Disposition accordingly.   Patient presents following an episode of dizziness that  occurred And resolved prior to arrival. Covid sx of headache, fever, cough, N/V/D, nasal congestion beginning 7/13. Patient is nontoxic appearing, afebrile, not tachycardic, not tachypneic, not hypotensive, maintains excellent SPO2 on room air, and is in no apparent distress.   I have reviewed the patient's chart to obtain more information.   I reviewed and interpreted the patient's available labs and radiological studies. We selected monoclonal antibody infusion as the patient's treatment based on her high risk for progression of COVID symptoms.  There were medications that were added to the patient's chart as as needed medications because they were contained in the monoclonal antibody  order set. Patient did not have allergic reaction symptoms while under my care.   Findings and plan of care discussed with attending physician, Pattricia Boss, MD. Dr. Jeanell Sparrow personally evaluated and examined this patient.  End of shift patient care handoff report given to [facility], PA-C. Plan: Orthostatic vitals, UA, ambulation.    Vitals:   01/06/21 2315 01/06/21 2330 01/06/21 2345 01/06/21 2347  BP: 132/68 117/69 127/65   Pulse: 61 (!) 59 (!) 58 (!) 55  Resp: 18 14 13 13   Temp:      TempSrc:      SpO2: 100% 99% 97% 96%  Weight:      Height:       No data found.      Lorayne Bender, PA-C 01/07/21 0032    Lorayne Bender, PA-C 01/07/21 0050    Pattricia Boss, MD 01/07/21 2147

## 2021-01-06 NOTE — ED Provider Notes (Signed)
[facility] DEPT Provider Note   CSN: [id number] Arrival date & time: 01/06/21  1826     History Chief Complaint  Patient presents with   Emesis   Covid +    Christine Morales is a 75 y.o. female with a history of hypertension, anemia, non-Hodgkin's lymphoma (chemo completed 2011).  Patient presents emerged department with a complaint of dizziness, nausea, and vomiting.  Patient reports that Christine Morales had an episode of dizziness earlier this evening at approximately 1700.  Patient states that this started after eating a meal.  Patient felt like the entire room was spinning.  This sensation lasted for approximately 10 minutes and spontaneously resolved on its own.  Patient endorses associated nausea and vomiting.  Patient states that Christine Morales vomited 3 times.  Patient describes emesis as stomach contents.  Patient denies any hematemesis or coffee-ground emesis.  Patient denies any dizziness or nausea at present.  After review patient received 4 mg Zofran with EMS.    Patient reports that Christine Morales tested positive for COVID-19 on 7/12.  Patient reports that Christine Morales has been having fever, chills, nonproductive cough, nasal congestion, shortness of breath, fatigue shortness of breath associated with her symptoms.  Reports that Christine Morales was vaccinated for COVID-19 and received booster.  Patient denies any chest pain, leg swelling, palpitations, headache, facial asymmetry, slurred speech, numbness, weakness, syncope, seizure activity.     Emesis Associated symptoms: chills, cough and fever   Associated symptoms: no abdominal pain, no diarrhea, no headaches and no sore throat       Past Medical History:  Diagnosis Date   Anemia    iron def   GERD (gastroesophageal reflux disease)    Gout    Helicobacter pylori ab+    Hemorrhoids    Hypertension    nhl dx'd 12/2009   chemo comp 2011   Non-Hodgkin lymphoma ([place]) 12/2009   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       Rhinitis, allergic    Thyroid disease    Ulcer    peptic    Patient Active Problem List   Diagnosis Date Noted   Hand swelling 11/05/2020   Renal cyst 09/18/2020   Fatty liver 09/18/2020   Dyslipidemia 01/17/2020   Elevated glucose 01/16/2020   History of total abdominal hysterectomy 12/21/2019   Foot pain, left 12/23/2018   Vertigo 10/21/2017   Morbid obesity ([place]) 10/21/2017   Acute bronchitis 07/05/2015   Traumatic hematoma of foot 02/12/2015   Hypothyroidism 09/13/2013   Abdominal pain, left lower quadrant 09/07/2012   Well adult exam 03/08/2012   CRF (chronic renal failure) 09/03/2011   B12 deficiency 11/22/2010   THRUSH 03/14/2010   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[place]) 01/07/2010   ANEMIA-IRON DEFICIENCY 12/28/2009   HEMORRHOIDS 74/05/8785   HELICOBACTER PYLORI INFECTION 11/23/2009   NAUSEA 11/23/2009   Backache 12/21/2008   OTHER SYMPTOMS INVOLVING DIGESTIVE SYSTEM OTHER 06/19/2008   SWEATING 03/24/2008   Flank pain 03/24/2008   Pain in limb 01/24/2008   Essential hypertension 12/01/2007   ALLERGIC RHINITIS 12/01/2007   Blood in stool 12/01/2007   REDUCTION MAMMOPLASTY, HX OF 12/01/2007    Past Surgical History:  Procedure Laterality Date   ABDOMINAL HYSTERECTOMY     BREAST REDUCTION SURGERY     COLON SURGERY     partial colectomy   COLONOSCOPY  07/2006   IR REMOVAL TUN ACCESS W/ PORT W/O FL MOD SED  12/15/2017   PARTIAL COLECTOMY  dr blackmon   TUBAL LIGATION       OB History     Gravida  2   Para  2   Term      Preterm      AB      Living  2      SAB      IAB      Ectopic      Multiple      Live Births              Family History  Problem Relation Age of Onset   Cancer Father    Heart disease Sister    Diabetes Brother    Hypertension Other        FH   Cholelithiasis Neg Hx    Colon cancer Neg Hx    Colon polyps Neg Hx    Esophageal cancer Neg Hx    Stomach cancer Neg Hx    Rectal cancer Neg Hx      Social History   Tobacco Use   Smoking status: Never   Smokeless tobacco: Never  Vaping Use   Vaping Use: Never used  Substance Use Topics   Alcohol use: No    Alcohol/week: 0.0 standard drinks   Drug use: No    Home Medications Prior to Admission medications   Medication Sig Start Date End Date Taking? Authorizing Provider  amLODipine (NORVASC) 5 MG tablet Take 1 tablet (5 mg total) by mouth daily. Must keep scheduled appt in August for future refills 12/17/20   Plotnikov, Evie Lacks, MD  Aspirin-Salicylamide-Caffeine (BC HEADACHE PO) Take 1 packet by mouth daily as needed (headache).     [provider]  Cholecalciferol (VITAMIN D3) 1000 UNITS tablet Take 1,000 Units by mouth daily.    [provider]  Cyanocobalamin (VITAMIN B 12 PO) Take 1,000 mcg by mouth daily.    [provider]  ferrous sulfate 325 (65 FE) MG tablet Take 325 mg by mouth daily with breakfast.    [provider]  levothyroxine (SYNTHROID) 100 MCG tablet TAKE 1 TABLET BY MOUTH EVERY DAY 01/16/20   Plotnikov, Evie Lacks, MD  losartan (COZAAR) 100 MG tablet Take 1 tablet (100 mg total) by mouth daily. Annual appt due in July must see provider for future refills 10/01/20   Plotnikov, Evie Lacks, MD    Allergies    Patient has no known allergies.  Review of Systems   Review of Systems  Constitutional:  Positive for chills and fever.  HENT:  Positive for congestion and rhinorrhea. Negative for sore throat.   Eyes:  Negative for visual disturbance.  Respiratory:  Positive for cough and shortness of breath.   Cardiovascular:  Negative for chest pain, palpitations and leg swelling.  Gastrointestinal:  Positive for nausea and vomiting. Negative for abdominal distention, abdominal pain, anal bleeding, blood in stool, constipation, diarrhea and rectal pain.  Genitourinary:  Negative for difficulty urinating, dysuria, frequency and hematuria.  Musculoskeletal:  Negative for back pain,  neck pain and neck stiffness.  Skin:  Negative for color change and rash.  Neurological:  Positive for dizziness. Negative for tremors, seizures, syncope, facial asymmetry, speech difficulty, weakness, light-headedness, numbness and headaches.  Psychiatric/Behavioral:  Negative for confusion.    Physical Exam Updated Vital Signs BP 107/69 (BP Location: Left Arm)   Pulse 65   Temp 98.1 F (36.7 C) (Oral)   Resp 16   Ht 5\' 6"  (1.676 m)   Wt 98.5 kg  SpO2 97%   BMI 35.05 kg/m   Physical Exam Vitals and nursing note reviewed.  Constitutional:      General: Christine Morales is not in acute distress.    Appearance: Christine Morales is ill-appearing. Christine Morales is not toxic-appearing or diaphoretic.  HENT:     Head: Normocephalic.  Eyes:     General: No scleral icterus.       Right eye: No discharge.        Left eye: No discharge.  Cardiovascular:     Rate and Rhythm: Normal rate.  Pulmonary:     Effort: Pulmonary effort is normal. No tachypnea, bradypnea or respiratory distress.     Breath sounds: Normal breath sounds. No stridor.  Abdominal:     General: Bowel sounds are normal. There is no distension. There are no signs of injury.     Palpations: Abdomen is soft. There is no mass or pulsatile mass.     Tenderness: There is no abdominal tenderness. There is no guarding or rebound.     Hernia: There is no hernia in the umbilical area or ventral area.  Musculoskeletal:     Right lower leg: Normal. No swelling or tenderness. No edema.     Left lower leg: Normal. No swelling or tenderness. No edema.  Skin:    General: Skin is warm and dry.     Coloration: Skin is not jaundiced or pale.  Neurological:     General: No focal deficit present.     Mental Status: Christine Morales is alert and oriented to person, place, and time.     GCS: GCS eye subscore is 4. GCS verbal subscore is 5. GCS motor subscore is 6.     Cranial Nerves: No facial asymmetry.     Motor: No weakness, tremor, seizure activity or pronator drift.      Coordination: Finger-Nose-Finger Test normal.     Comments: No facial asymmetry, slurred speech  Psychiatric:        Behavior: Behavior is cooperative.    ED Results / Procedures / Treatments   Labs (all labs ordered are listed, but only abnormal results are displayed) Labs Reviewed  CBC WITH DIFFERENTIAL/PLATELET  COMPREHENSIVE METABOLIC PANEL    EKG None  Radiology No results found.  Procedures Procedures   Medications Ordered in ED Medications - No data to display  ED Course  I have reviewed the triage vital signs and the nursing notes.  Pertinent labs & imaging results that were available during my care of the patient were reviewed by me and considered in my medical decision making (see chart for details).    MDM Rules/Calculators/A&P                          Ill-appearing 75 year old female no acute stress, nontoxic-appearing.  Patient presents emerged department chief complaint of dizziness, nausea and vomiting.  Dizziness occurred approximately 1700 and, started after eating a meal.  Lasted for approximately 10 minutes.  Felt the entire room was spinning.  Spontaneously.  Patient reports nausea and 3 episodes of vomiting associated with this episode.  Emesis is nonbloody. Patient denies any dizziness or nausea at present.  Patient received 4 mg Zofran with EMS.  Patient also reports symptoms related to her COVID-19 infection.  Patient reports that Christine Morales tested positive COVID-19 on Tuesday.  Patient is vaccinated and has received booster.  Plan to obtain CBC, CMP, and EKG.  Will check oxygen saturation with ambulation and orthostatic vital signs.  We will give patient 1 L fluid bolus.  EKG shows normal sinus rhythm CBC is unremarkable  Patient care transferred to PA Joy at the end of my shift. Patient presentation, ED course, and plan of care discussed with review of all pertinent labs and imaging. Please see his/her note for further details regarding further ED  course and disposition.   BLESSEN KIMBROUGH was evaluated in Emergency Department on 01/06/2021 for the symptoms described in the history of present illness. Christine Morales was evaluated in the context of the global COVID-19 pandemic, which necessitated consideration that the patient might be at risk for infection with the SARS-CoV-2 virus that causes COVID-19. Institutional protocols and algorithms that pertain to the evaluation of patients at risk for COVID-19 are in a state of rapid change based on information released by regulatory bodies including the CDC and federal and state organizations. These policies and algorithms were followed during the patient's care in the ED.   Final Clinical Impression(s) / ED Diagnoses Final diagnoses:  None    Rx / DC Orders ED Discharge Orders     None        Dyann Ruddle 01/06/21 2036    Pattricia Boss, MD 01/16/21 2258

## 2021-01-06 NOTE — ED Notes (Signed)
Orthostatic completed at 2245

## 2021-01-06 NOTE — ED Triage Notes (Signed)
Pt BIB [facility] with c/o  dizziness, abd pain and emesis. EMS reports pt had a sudden onset of dizziness followed by vomiting and abdominal pain. Rates pain a 3/10. Pt also tested positive for covid on Wednesday with a home test.  Denies chest pain or SOB. EMS reports stroke screen neg.   4 mg Zofran given.  HR-68 20 G left hand CBG 119 BP 168/80 99% room air  RR-16 regular.

## 2021-01-07 ENCOUNTER — Emergency Department (HOSPITAL_COMMUNITY): Payer: Medicare PPO

## 2021-01-07 ENCOUNTER — Telehealth: Payer: Self-pay

## 2021-01-07 DIAGNOSIS — U071 COVID-19: Secondary | ICD-10-CM | POA: Diagnosis not present

## 2021-01-07 DIAGNOSIS — R1110 Vomiting, unspecified: Secondary | ICD-10-CM | POA: Diagnosis not present

## 2021-01-07 DIAGNOSIS — R42 Dizziness and giddiness: Secondary | ICD-10-CM | POA: Diagnosis not present

## 2021-01-07 DIAGNOSIS — B342 Coronavirus infection, unspecified: Secondary | ICD-10-CM | POA: Diagnosis not present

## 2021-01-07 LAB — URINALYSIS, ROUTINE W REFLEX MICROSCOPIC
Bilirubin Urine: NEGATIVE
Glucose, UA: NEGATIVE mg/dL
Hgb urine dipstick: NEGATIVE
Ketones, ur: NEGATIVE mg/dL
Leukocytes,Ua: NEGATIVE
Nitrite: NEGATIVE
Protein, ur: NEGATIVE mg/dL
Specific Gravity, Urine: 1.01 (ref 1.005–1.030)
pH: 5 (ref 5.0–8.0)

## 2021-01-07 IMAGING — CT CT HEAD W/O CM
3 series · 16 of 47 positions shown, 19 images · non-contrast
Comparison: CT head dated [DATE]

CLINICAL DATA: Dizziness, vomiting

EXAM:
CT HEAD WITHOUT CONTRAST
TECHNIQUE: Contiguous axial images were obtained from the base of the skull
through the vertex without intravenous contrast.

[Series 2: head wo · axial · 0.43mm/px · z∈[-148,-18]mm · 10 of 32 slices shown, 13 images]
[im 3/32  brain]
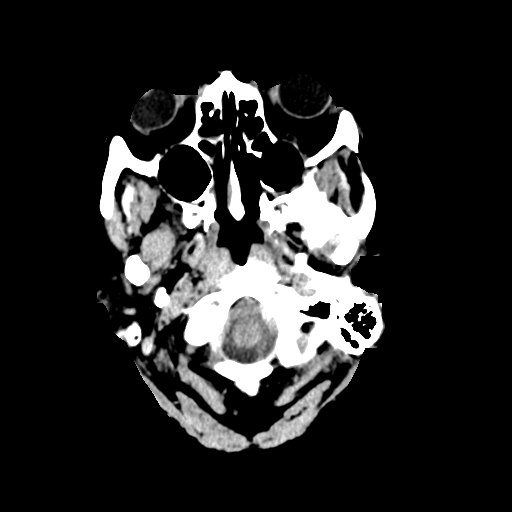
[im 3/32  bone]
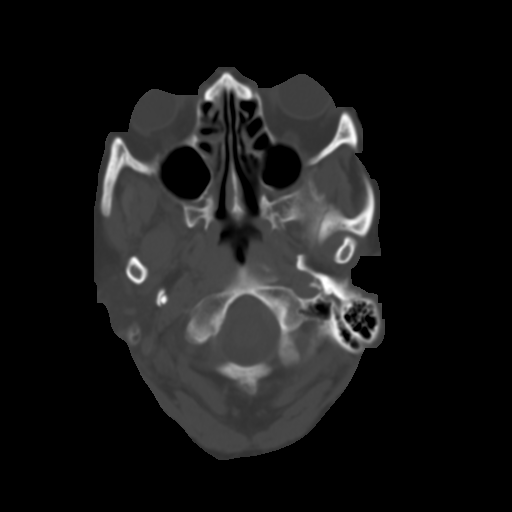
[im 6/32  brain]
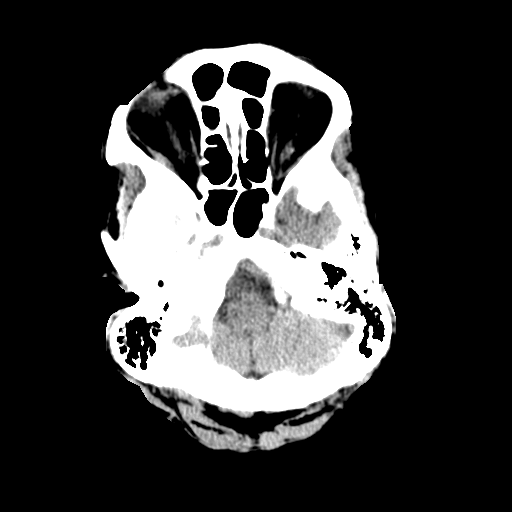
[im 9/32  brain]
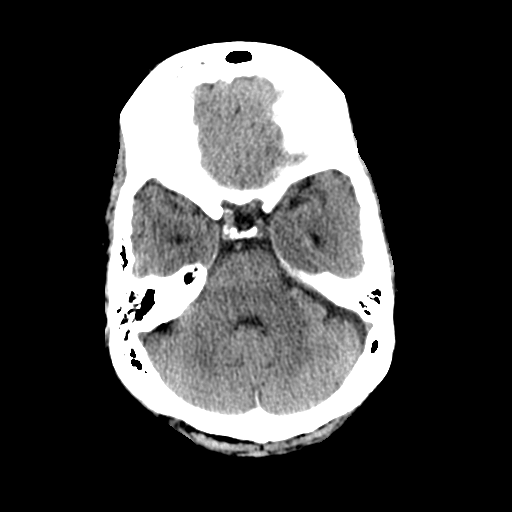
[im 11/32  brain]
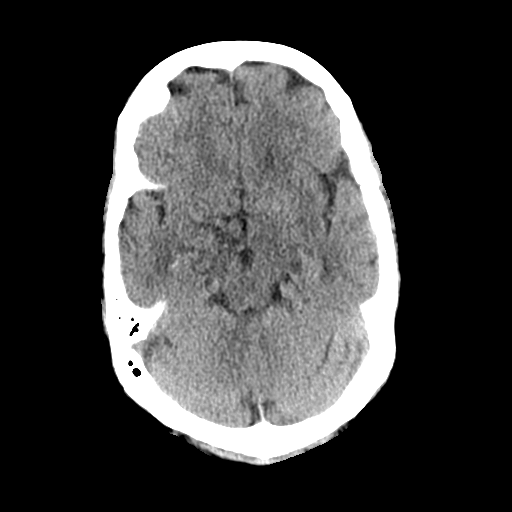
[im 14/32  brain]
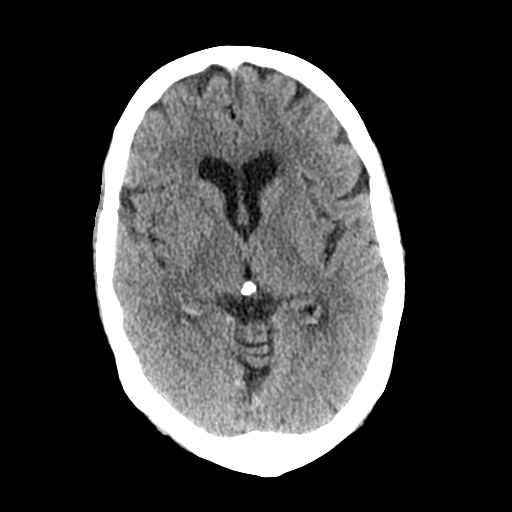
[im 14/32  bone]
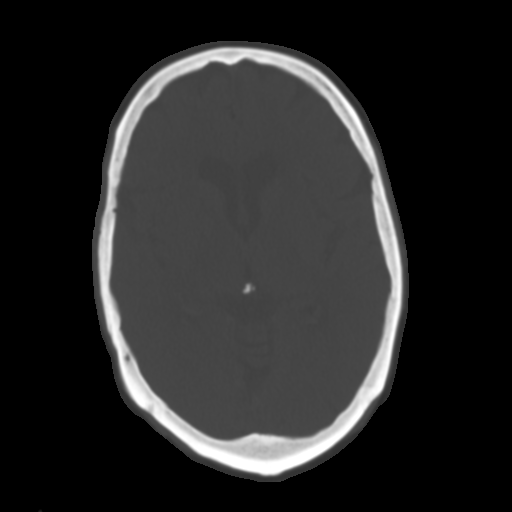
[im 18/32  brain]
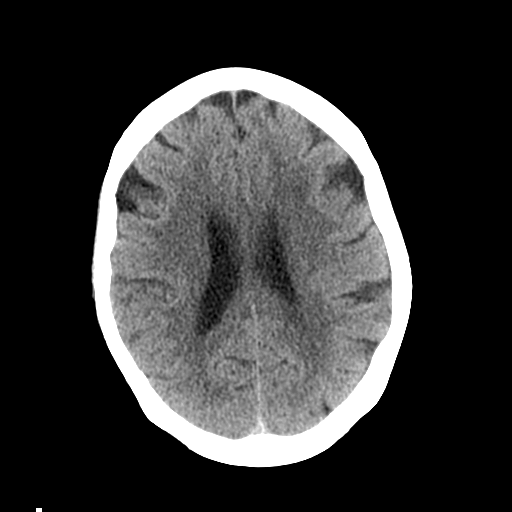
[im 21/32  brain]
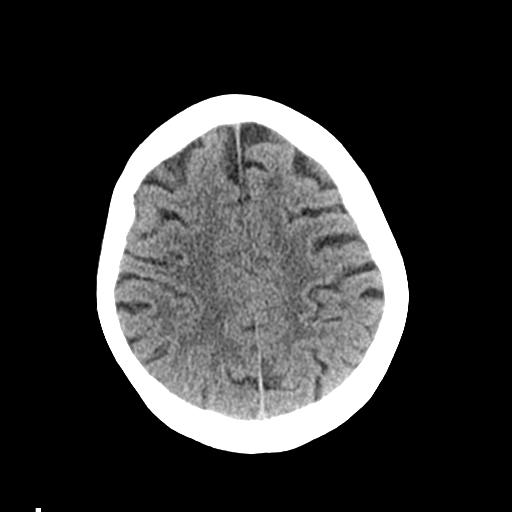
[im 24/32  brain]
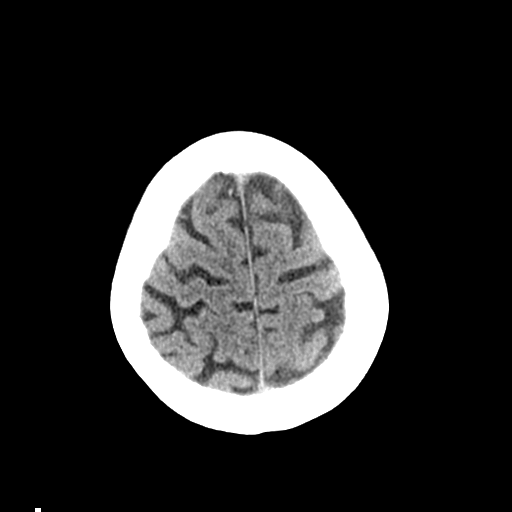
[im 26/32  brain]
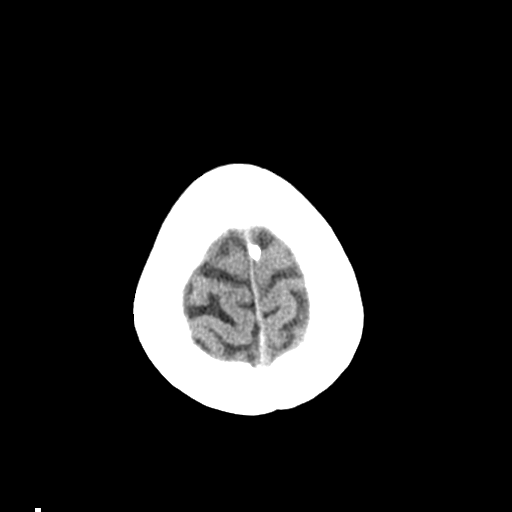
[im 26/32  bone]
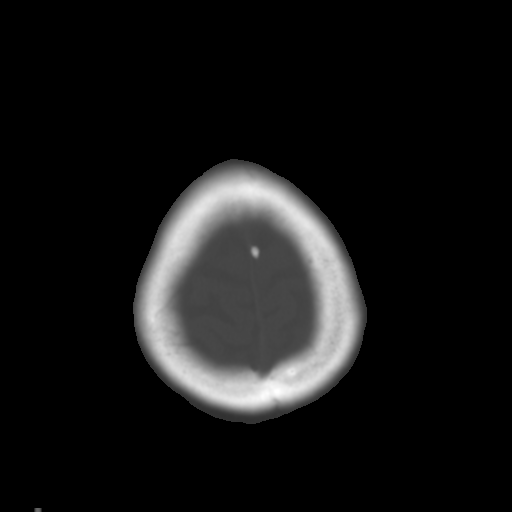
[im 29/32  brain]
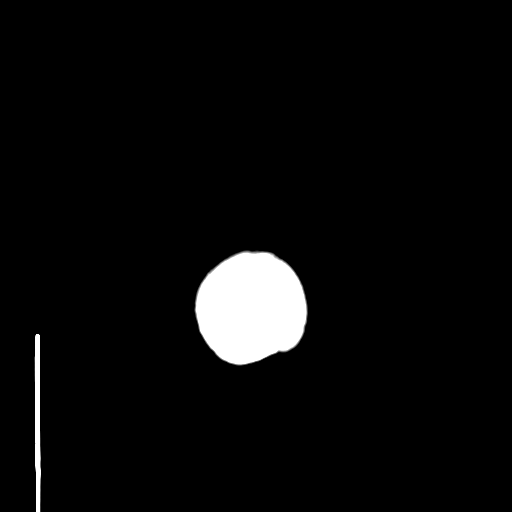

[Series 5: coronal soft tissue · coronal · 0.32mm/px · 3 of 68 slices shown]
[im 23/68  brain]
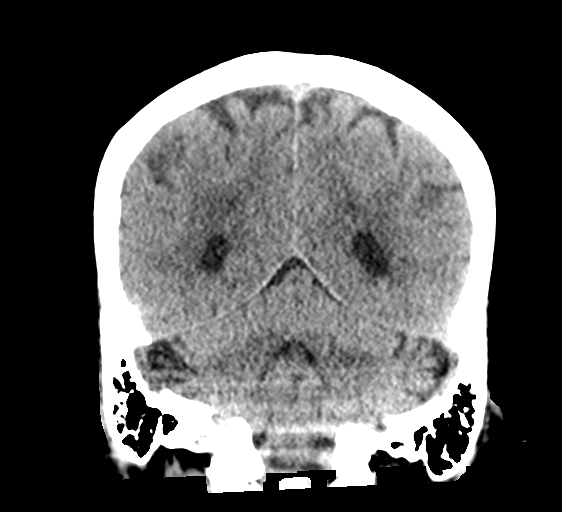
[im 30/68  brain]
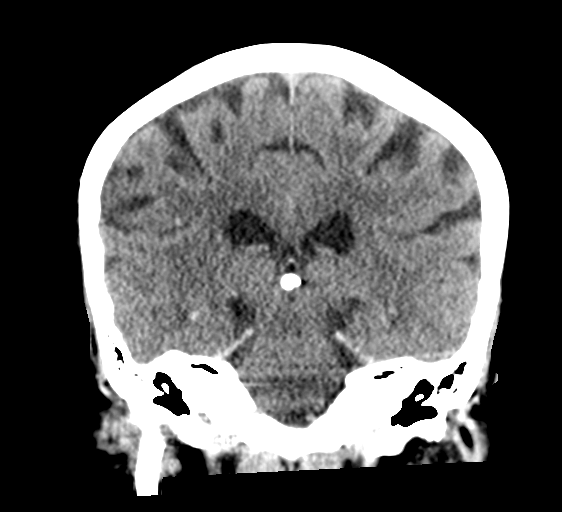
[im 38/68  brain]
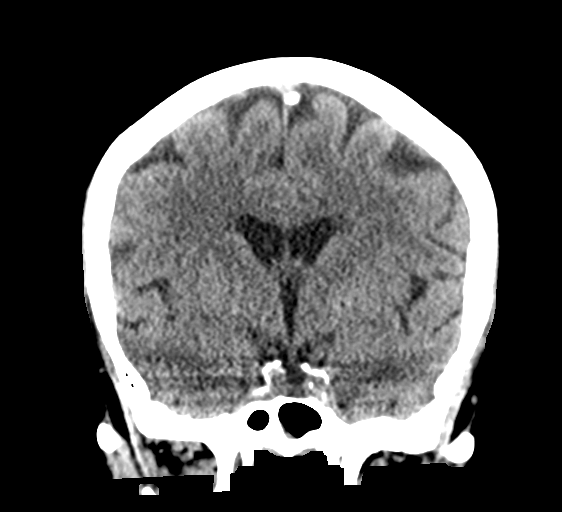

[Series 6: sagittal soft tissue · sagittal · 0.32mm/px · 3 of 60 slices shown]
[im 20/60  brain]
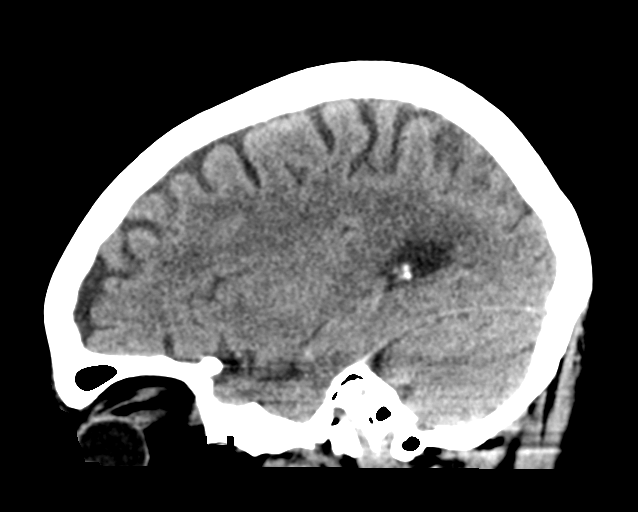
[im 30/60  brain]
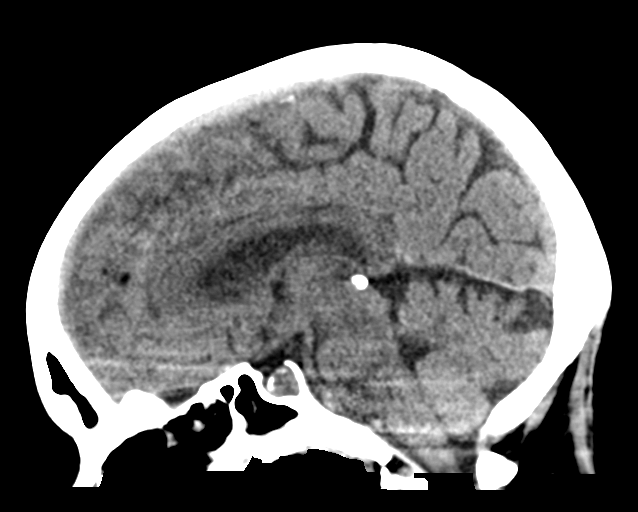
[im 40/60  brain]
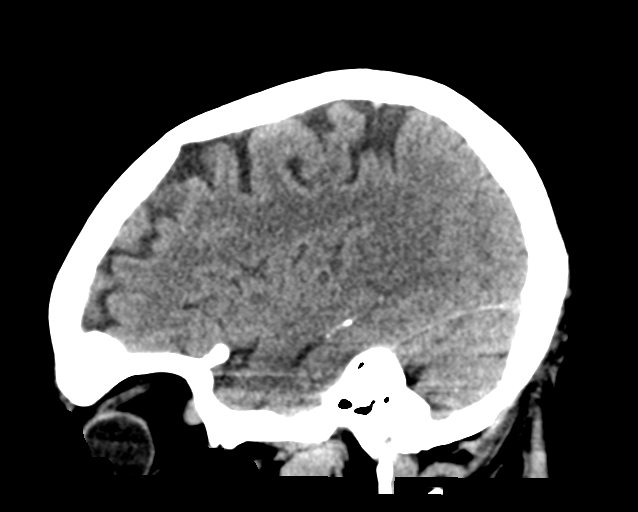

[16 of 47 positions shown; findings below may reference images not displayed]

FINDINGS: Brain: No evidence of acute infarction, hemorrhage, hydrocephalus,
extra-axial collection or mass lesion/mass effect.

Mild subcortical white matter and periventricular small vessel
ischemic changes. Basal ganglia calcifications, benign.

Vascular: Intracranial atherosclerosis.

Skull: Normal. Negative for fracture or focal lesion.

Sinuses/Orbits: The visualized paranasal sinuses are essentially
clear. The mastoid air cells are unopacified.

Other: None.
IMPRESSION: No evidence of acute intracranial abnormality. Mild small vessel
ischemic changes.

## 2021-01-07 IMAGING — MR MR MRA HEAD W/O CM
14 of 16 series · 37 of 48 positions shown · IV contrast (gadavist)
Comparison: Head CT [DATE]

CLINICAL DATA: Dizziness.  Vomiting.  Recent coronavirus infection.

EXAM:
MRI HEAD WITHOUT AND WITH CONTRAST
MRA HEAD WITHOUT CONTRAST
TECHNIQUE: Multiplanar, multi-echo pulse sequences of the brain and surrounding
structures were acquired without and with intravenous contrast.
Angiographic images of the Circle of Willis were acquired using MRA
technique without intravenous contrast.
CONTRAST:  10mL GADAVIST GADOBUTROL 1 MMOL/ML IV SOLN

[Series 5: DWI · axial · 3.0mm · 1.36mm/px · z∈[-7,+131]mm · 5 of 96 slices shown (1 of 2)]
[im 1/96]
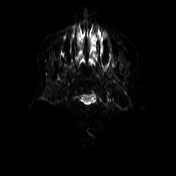
[im 24/96]
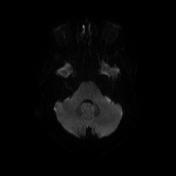
[im 48/96]
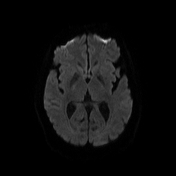
[im 72/96]
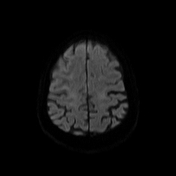
[im 96/96]
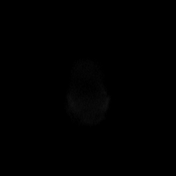

[Series 6: DWI · axial · 3.0mm · 1.36mm/px · z∈[-7,+131]mm · 3 of 48 slices shown (2 of 2)]
[im 1/48]
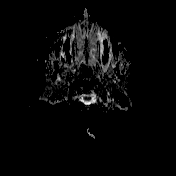
[im 24/48]
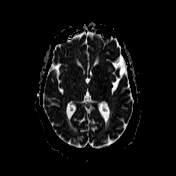
[im 48/48]
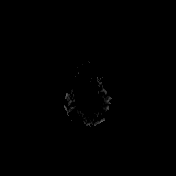

[Series 7: T1 · sagittal · 5.0mm · 0.75mm/px · 1 of 24 slices shown (1 of 3)]
[im 1/24]
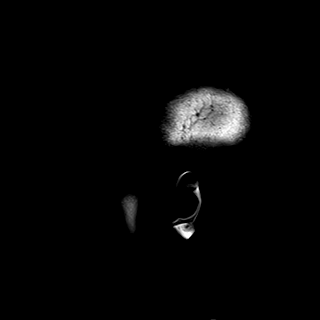

[Series 8: T2 · axial · 5.0mm · 0.62mm/px · 1 of 22 slices shown]
[im 1/22]
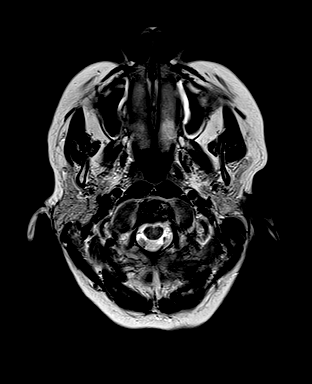

[Series 9: swi_images · axial · 3.0mm · 0.75mm/px · z∈[-10,+128]mm · 3 of 48 slices shown]
[im 1/48]
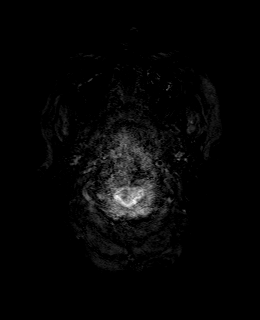
[im 24/48]
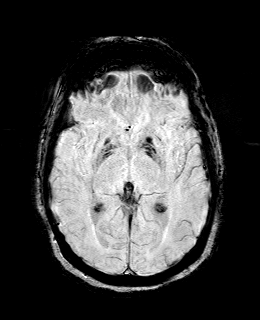
[im 48/48]
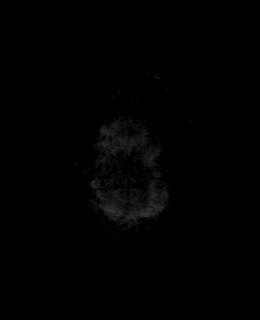

[Series 11: FLAIR · axial · 3.0mm · 0.75mm/px · z∈[-10,+128]mm · 3 of 48 slices shown]
[im 1/48]
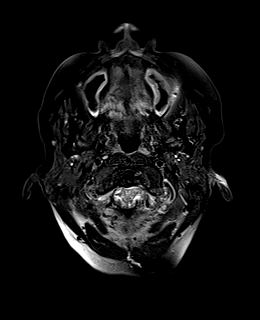
[im 24/48]
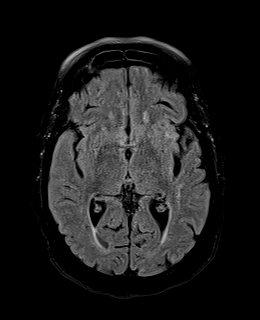
[im 48/48]
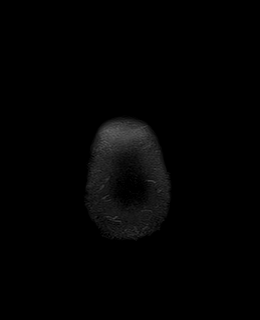

[Series 12: T1 · axial · 1.0mm · 0.94mm/px · z∈[-9,+130]mm · 8 of 144 slices shown (2 of 3)]
[im 1/144]
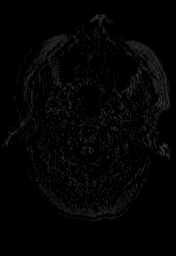
[im 21/144]
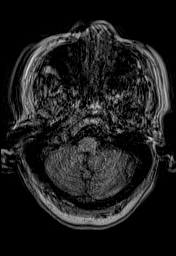
[im 41/144]
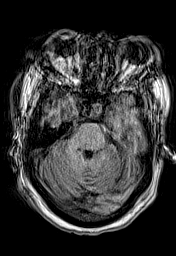
[im 62/144]
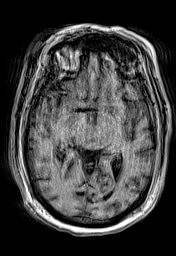
[im 82/144]
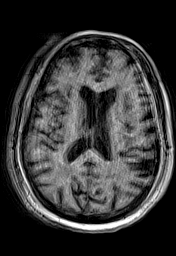
[im 103/144]
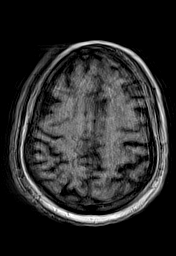
[im 123/144]
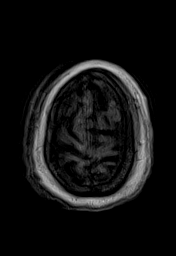
[im 144/144]
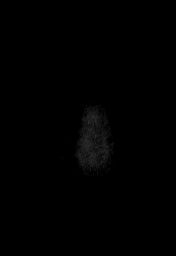

[Series 13: cor dwi_tracew · coronal · 5.0mm · 1.53mm/px · 3 of 48 slices shown]
[im 1/48]
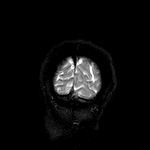
[im 24/48]
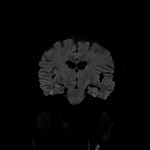
[im 48/48]
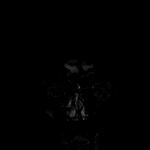

[Series 14: cor dwi_adc · coronal · 5.0mm · 1.53mm/px · 1 of 24 slices shown]
[im 1/24]
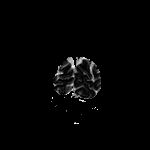

[Series 15: T1 · axial · 3.0mm · 0.45mm/px · z∈[-9,+129]mm · 3 of 48 slices shown (3 of 3)]
[im 1/48]
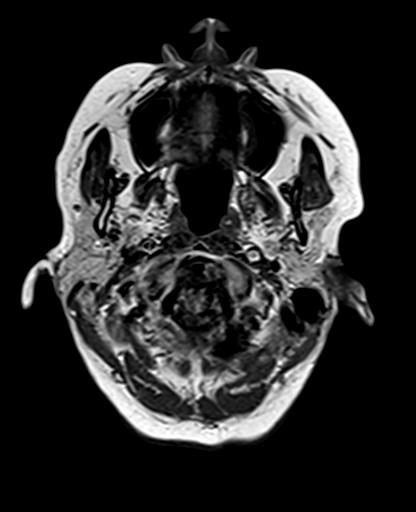
[im 24/48]
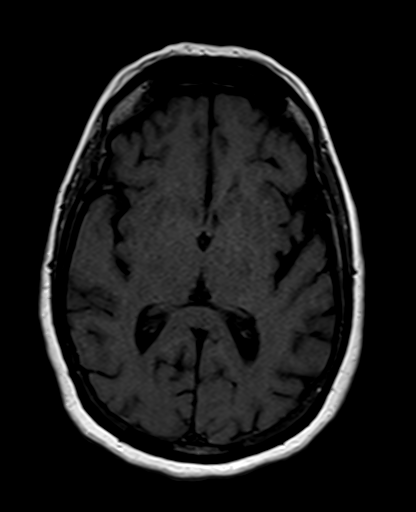
[im 48/48]
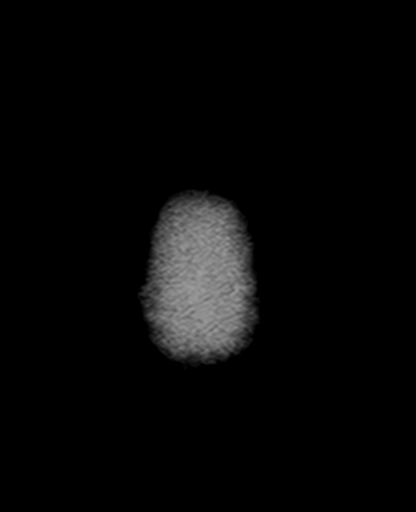

[Series 27: T2 post-contrast · coronal · 5.0mm · 0.86mm/px · 1 of 24 slices shown]
[im 1/24]
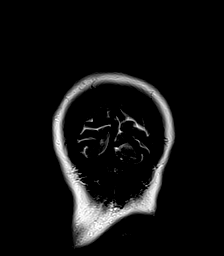

[Series 28: T1 post-contrast · axial · 3.0mm · 0.45mm/px · z∈[-4,+132]mm · 3 of 48 slices shown (1 of 3)]
[im 1/48]
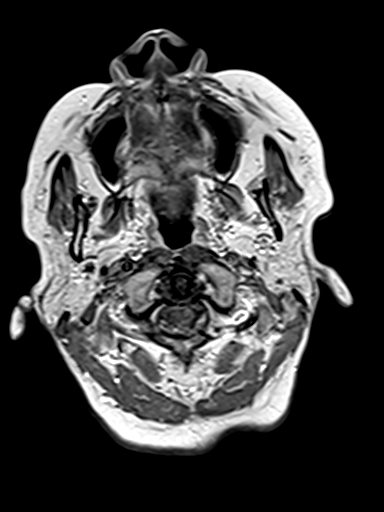
[im 24/48]
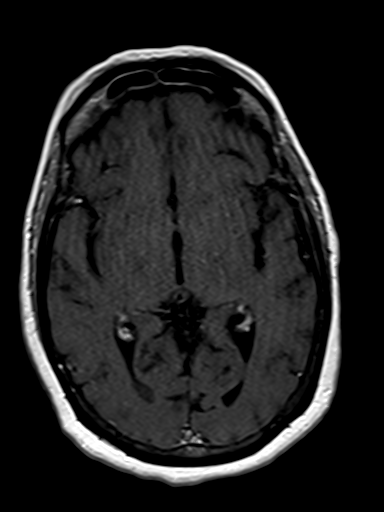
[im 48/48]
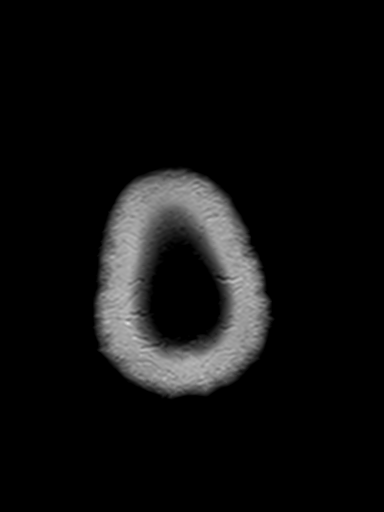

[Series 30: T1 post-contrast · coronal · 5.0mm · 0.43mm/px · 1 of 24 slices shown (2 of 3)]
[im 1/24]
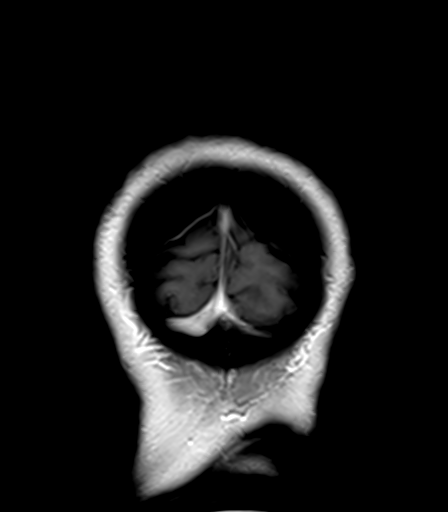

[Series 31: T1 post-contrast · sagittal · 5.0mm · 0.94mm/px · 1 of 24 slices shown (3 of 3)]
[im 1/24]
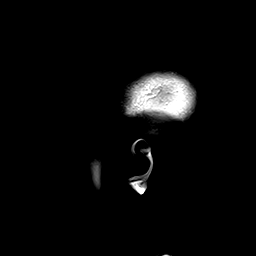

[37 of 48 positions shown; findings below may reference images not displayed]

FINDINGS: MRI HEAD FINDINGS

Brain: Diffusion imaging does not show any acute or subacute
infarction. No focal abnormality affects the brainstem or
cerebellum. Cerebral hemispheres show moderate chronic small-vessel
ischemic change of the deep and subcortical white matter. No
cortical or large vessel territory infarction. No mass lesion,
hemorrhage, hydrocephalus or extra-axial collection. After contrast
administration, no abnormal enhancement occurs.

Vascular: Major vessels at the base of the brain show flow.

Skull and upper cervical spine: Negative

Sinuses/Orbits: Mild mucosal thickening of the paranasal sinuses. No
advanced sinusitis. No layering fluid.

Other: None

MRA HEAD FINDINGS

Both internal carotid arteries are widely patent into the brain. No
siphon stenosis. The anterior and middle cerebral vessels are patent
without proximal stenosis, aneurysm or vascular malformation.

Both vertebral arteries are widely patent to the basilar. No basilar
stenosis. Posterior circulation branch vessels appear normal.
IMPRESSION: MRI head: No acute finding to explain the clinical presentation.
Moderate chronic small-vessel ischemic changes of the cerebral
hemispheric white matter.

Mild mucosal inflammation of the paranasal sinuses without advanced
finding.

Normal intracranial MR angiography.

## 2021-01-07 IMAGING — MR MR HEAD WO/W CM
14 of 16 series · 37 of 48 positions shown · IV contrast (gadavist)
Comparison: Head CT [DATE]

CLINICAL DATA: Dizziness.  Vomiting.  Recent coronavirus infection.

EXAM:
MRI HEAD WITHOUT AND WITH CONTRAST
MRA HEAD WITHOUT CONTRAST
TECHNIQUE: Multiplanar, multi-echo pulse sequences of the brain and surrounding
structures were acquired without and with intravenous contrast.
Angiographic images of the Circle of Willis were acquired using MRA
technique without intravenous contrast.
CONTRAST:  10mL GADAVIST GADOBUTROL 1 MMOL/ML IV SOLN

[Series 5: DWI · axial · 3.0mm · 1.36mm/px · z∈[-7,+131]mm · 5 of 96 slices shown (1 of 2)]
[im 1/96]
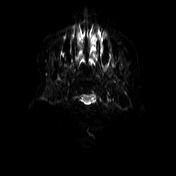
[im 24/96]
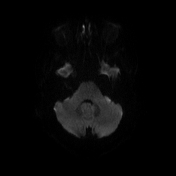
[im 48/96]
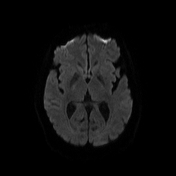
[im 72/96]
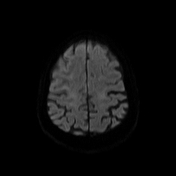
[im 96/96]
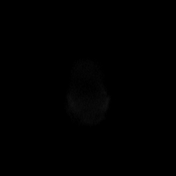

[Series 6: DWI · axial · 3.0mm · 1.36mm/px · z∈[-7,+131]mm · 3 of 48 slices shown (2 of 2)]
[im 1/48]
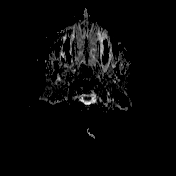
[im 24/48]
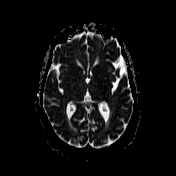
[im 48/48]
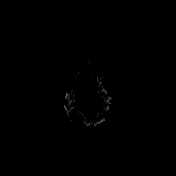

[Series 7: T1 · sagittal · 5.0mm · 0.75mm/px · 1 of 24 slices shown (1 of 3)]
[im 1/24]
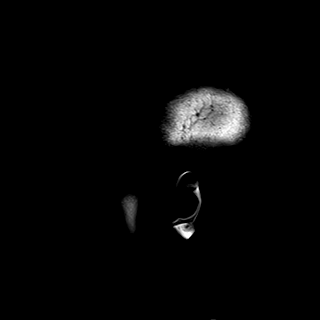

[Series 8: T2 · axial · 5.0mm · 0.62mm/px · 1 of 22 slices shown]
[im 1/22]
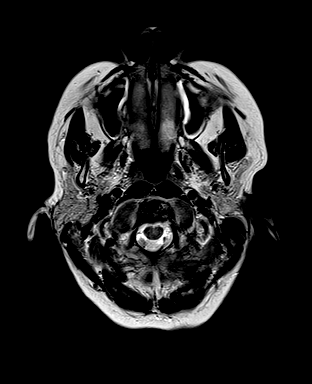

[Series 9: swi_images · axial · 3.0mm · 0.75mm/px · z∈[-10,+128]mm · 3 of 48 slices shown]
[im 1/48]
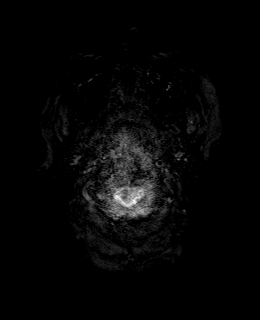
[im 24/48]
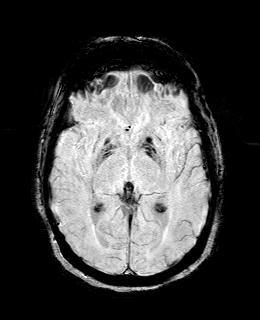
[im 48/48]
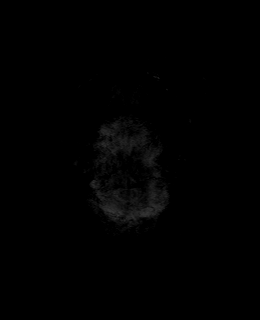

[Series 11: FLAIR · axial · 3.0mm · 0.75mm/px · z∈[-10,+128]mm · 3 of 48 slices shown]
[im 1/48]
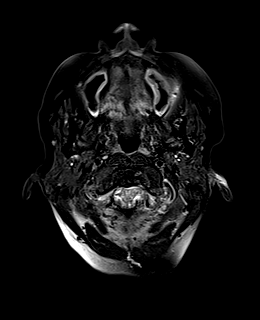
[im 24/48]
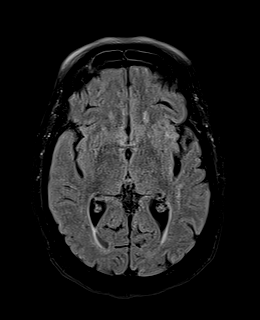
[im 48/48]
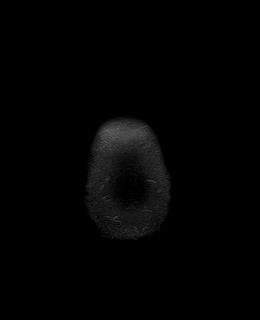

[Series 12: T1 · axial · 1.0mm · 0.94mm/px · z∈[-9,+130]mm · 8 of 144 slices shown (2 of 3)]
[im 1/144]
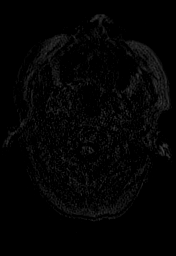
[im 21/144]
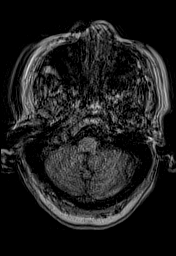
[im 41/144]
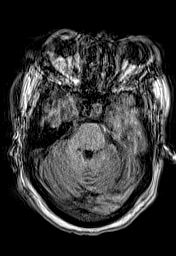
[im 62/144]
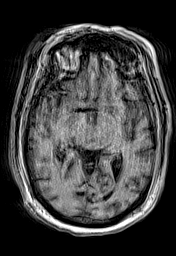
[im 82/144]
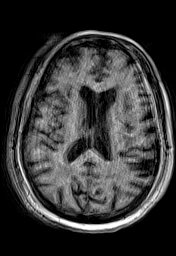
[im 103/144]
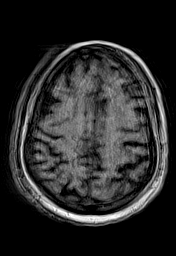
[im 123/144]
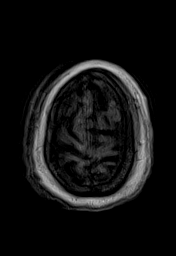
[im 144/144]
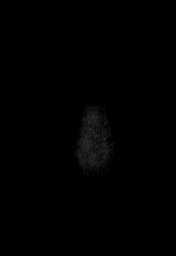

[Series 13: cor dwi_tracew · coronal · 5.0mm · 1.53mm/px · 3 of 48 slices shown]
[im 1/48]
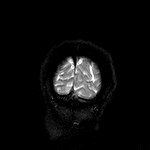
[im 24/48]
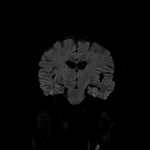
[im 48/48]
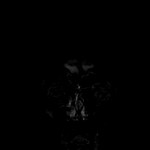

[Series 14: cor dwi_adc · coronal · 5.0mm · 1.53mm/px · 1 of 24 slices shown]
[im 1/24]
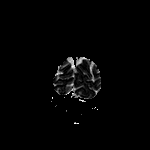

[Series 15: T1 · axial · 3.0mm · 0.45mm/px · z∈[-9,+129]mm · 3 of 48 slices shown (3 of 3)]
[im 1/48]
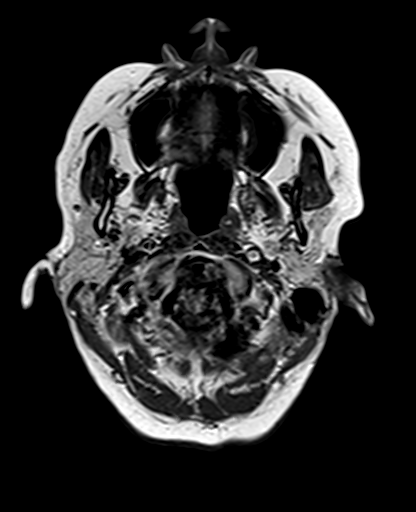
[im 24/48]
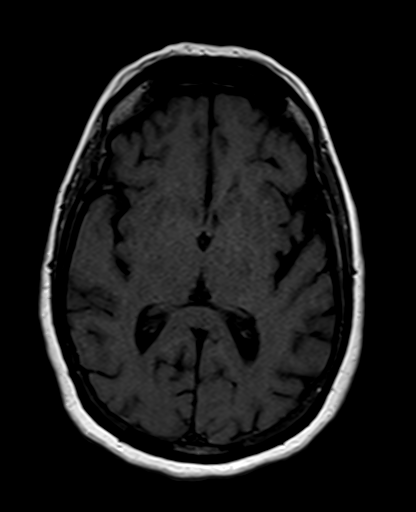
[im 48/48]
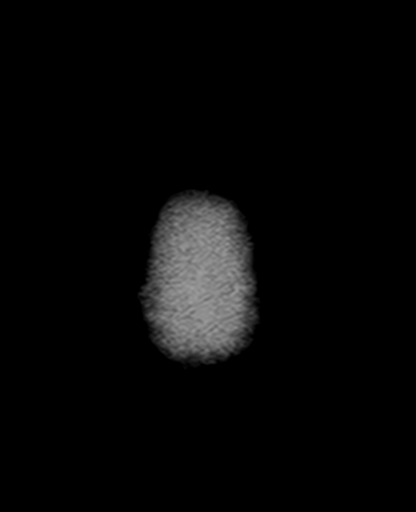

[Series 27: T2 post-contrast · coronal · 5.0mm · 0.86mm/px · 1 of 24 slices shown]
[im 1/24]
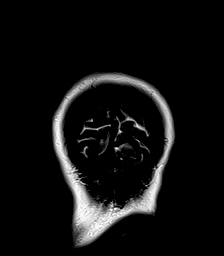

[Series 28: T1 post-contrast · axial · 3.0mm · 0.45mm/px · z∈[-4,+132]mm · 3 of 48 slices shown (1 of 3)]
[im 1/48]
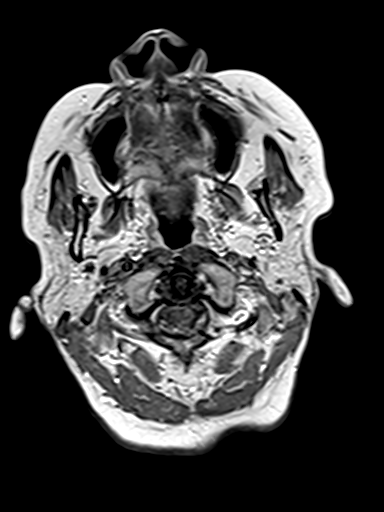
[im 24/48]
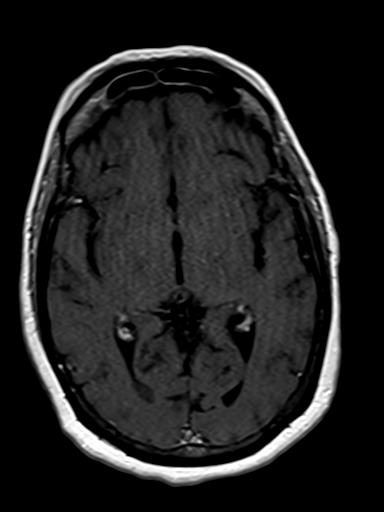
[im 48/48]
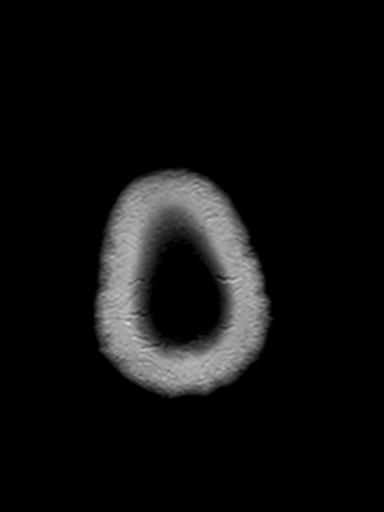

[Series 30: T1 post-contrast · coronal · 5.0mm · 0.43mm/px · 1 of 24 slices shown (2 of 3)]
[im 1/24]
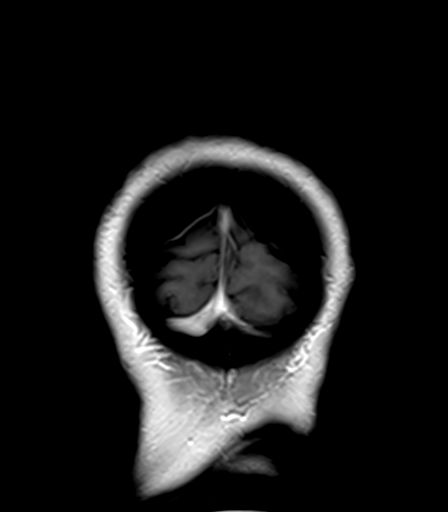

[Series 31: T1 post-contrast · sagittal · 5.0mm · 0.94mm/px · 1 of 24 slices shown (3 of 3)]
[im 1/24]
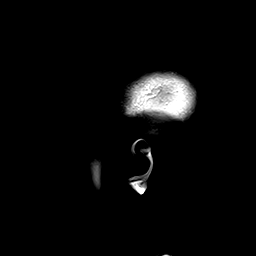

[37 of 48 positions shown; findings below may reference images not displayed]

FINDINGS: MRI HEAD FINDINGS

Brain: Diffusion imaging does not show any acute or subacute
infarction. No focal abnormality affects the brainstem or
cerebellum. Cerebral hemispheres show moderate chronic small-vessel
ischemic change of the deep and subcortical white matter. No
cortical or large vessel territory infarction. No mass lesion,
hemorrhage, hydrocephalus or extra-axial collection. After contrast
administration, no abnormal enhancement occurs.

Vascular: Major vessels at the base of the brain show flow.

Skull and upper cervical spine: Negative

Sinuses/Orbits: Mild mucosal thickening of the paranasal sinuses. No
advanced sinusitis. No layering fluid.

Other: None

MRA HEAD FINDINGS

Both internal carotid arteries are widely patent into the brain. No
siphon stenosis. The anterior and middle cerebral vessels are patent
without proximal stenosis, aneurysm or vascular malformation.

Both vertebral arteries are widely patent to the basilar. No basilar
stenosis. Posterior circulation branch vessels appear normal.
IMPRESSION: MRI head: No acute finding to explain the clinical presentation.
Moderate chronic small-vessel ischemic changes of the cerebral
hemispheric white matter.

Mild mucosal inflammation of the paranasal sinuses without advanced
finding.

Normal intracranial MR angiography.

## 2021-01-07 MED ORDER — BENZONATATE 100 MG PO CAPS: 100.0000 mg | ORAL_CAPSULE | Freq: Three times a day (TID) | ORAL | 0 refills | Status: DC

## 2021-01-07 MED ORDER — ALBUTEROL SULFATE HFA 108 (90 BASE) MCG/ACT IN AERS: 1.0000 | INHALATION_SPRAY | Freq: Four times a day (QID) | RESPIRATORY_TRACT | 0 refills | Status: DC | PRN

## 2021-01-07 MED ORDER — GADOBUTROL 1 MMOL/ML IV SOLN
10.0000 mL | Freq: Once | INTRAVENOUS | Status: AC | PRN
  Administered 2021-01-07: 10 mL via INTRAVENOUS

## 2021-01-07 NOTE — Telephone Encounter (Signed)
Sent FYI per pt to Dr, Alain Marion that she has tested pos for COVID.

## 2021-01-07 NOTE — Discharge Instructions (Addendum)
Follow up with your primary care provider for your symptoms  EC epidural inhaler as needed for shortness of breath he is seen Tessalon Perles for cough.

## 2021-01-07 NOTE — Telephone Encounter (Signed)
Noted.  Please schedule a virtual office visit if sick. Thanks

## 2021-01-07 NOTE — ED Provider Notes (Signed)
Care assumed from [facility].  Please see his full H&P.  In short,  Christine Morales is a 75 y.o. female presents for dizziness and vomiting.  Pt c/o sudden onset dizziness followed by vomiting and abdominal pain.  Pt recently COVID + with a home test.  Pt's dizziness resolved spontaneously within approx 25 minutes. No persistent dizziness.  Denies headache  Plan: CT, PO trial, ambulation and UA.  Physical Exam  BP 127/65   Pulse (!) 55   Temp 98.1 F (36.7 C) (Oral)   Resp 13   Ht 5\' 6"  (1.676 m)   Wt 98.5 kg   SpO2 96%   BMI 35.05 kg/m   Physical Exam Vitals and nursing note reviewed.  Constitutional:      General: She is not in acute distress.    Appearance: She is well-developed. She is not ill-appearing.  HENT:     Head: Normocephalic.  Eyes:     General: No scleral icterus.    Conjunctiva/sclera: Conjunctivae normal.  Cardiovascular:     Rate and Rhythm: Normal rate.  Pulmonary:     Effort: Pulmonary effort is normal.  Abdominal:     General: There is no distension.  Musculoskeletal:        General: Normal range of motion.     Cervical back: Normal range of motion.  Skin:    General: Skin is warm and dry.  Neurological:     General: No focal deficit present.     Mental Status: She is alert and oriented to person, place, and time.     Coordination: Coordination normal.     Gait: Gait normal.     Comments: Speech clear and goal oriented  Psychiatric:        Mood and Affect: Mood normal.    ED Course/Procedures   Clinical Course as of 01/07/21 0647  Sun Jan 06, 2021  2157 Creatinine(!): 2.04 CrCl calculated to be 39 mL/min using Cockcroft-Gault. [SJ]  2158 Spoke with pharmacist.  We reviewed the patient's med list and compared it to medications contraindicated with Paxlovid. States Paxlovid would be contraindicated in this patient due to her amlodipine. [SJ]    Clinical Course User Index [SJ] Lorayne Bender, PA-C    Procedures  Results for orders placed or  performed during the hospital encounter of 01/06/21  CBC with Differential  Result Value Ref Range   WBC 4.4 4.0 - 10.5 K/uL   RBC 4.19 3.87 - 5.11 MIL/uL   Hemoglobin 12.1 12.0 - 15.0 g/dL   HCT 36.8 36.0 - 46.0 %   MCV 87.8 80.0 - 100.0 fL   MCH 28.9 26.0 - 34.0 pg   MCHC 32.9 30.0 - 36.0 g/dL   RDW 14.1 11.5 - 15.5 %   Platelets 212 150 - 400 K/uL   nRBC 0.0 0.0 - 0.2 %   Neutrophils Relative % 63 %   Neutro Abs 2.7 1.7 - 7.7 K/uL   Lymphocytes Relative 27 %   Lymphs Abs 1.2 0.7 - 4.0 K/uL   Monocytes Relative 10 %   Monocytes Absolute 0.5 0.1 - 1.0 K/uL   Eosinophils Relative 0 %   Eosinophils Absolute 0.0 0.0 - 0.5 K/uL   Basophils Relative 0 %   Basophils Absolute 0.0 0.0 - 0.1 K/uL   Immature Granulocytes 0 %   Abs Immature Granulocytes 0.01 0.00 - 0.07 K/uL  Comprehensive metabolic panel  Result Value Ref Range   Sodium 139 135 - 145 mmol/L  Potassium 4.2 3.5 - 5.1 mmol/L   Chloride 106 98 - 111 mmol/L   CO2 24 22 - 32 mmol/L   Glucose, Bld 111 (H) 70 - 99 mg/dL   BUN 30 (H) 8 - 23 mg/dL   Creatinine, Ser 2.04 (H) 0.44 - 1.00 mg/dL   Calcium 9.2 8.9 - 10.3 mg/dL   Total Protein 6.7 6.5 - 8.1 g/dL   Albumin 3.7 3.5 - 5.0 g/dL   AST 22 15 - 41 U/L   ALT 15 0 - 44 U/L   Alkaline Phosphatase 59 38 - 126 U/L   Total Bilirubin 0.6 0.3 - 1.2 mg/dL   GFR, Estimated 25 (L) >60 mL/min   Anion gap 9 5 - 15  Urinalysis, Routine w reflex microscopic Urine, Clean Catch  Result Value Ref Range   Color, Urine YELLOW YELLOW   APPearance CLEAR CLEAR   Specific Gravity, Urine 1.010 1.005 - 1.030   pH 5.0 5.0 - 8.0   Glucose, UA NEGATIVE NEGATIVE mg/dL   Hgb urine dipstick NEGATIVE NEGATIVE   Bilirubin Urine NEGATIVE NEGATIVE   Ketones, ur NEGATIVE NEGATIVE mg/dL   Protein, ur NEGATIVE NEGATIVE mg/dL   Nitrite NEGATIVE NEGATIVE   Leukocytes,Ua NEGATIVE NEGATIVE   CT Head Wo Contrast  Result Date: 01/07/2021 CLINICAL DATA:  Dizziness, vomiting EXAM: CT HEAD WITHOUT  CONTRAST TECHNIQUE: Contiguous axial images were obtained from the base of the skull through the vertex without intravenous contrast. COMPARISON:  CT head dated 08/31/2019 FINDINGS: Brain: No evidence of acute infarction, hemorrhage, hydrocephalus, extra-axial collection or mass lesion/mass effect. Mild subcortical white matter and periventricular small vessel ischemic changes. Basal ganglia calcifications, benign. Vascular: Intracranial atherosclerosis. Skull: Normal. Negative for fracture or focal lesion. Sinuses/Orbits: The visualized paranasal sinuses are essentially clear. The mastoid air cells are unopacified. Other: None. IMPRESSION: No evidence of acute intracranial abnormality. Mild small vessel ischemic changes. Electronically Signed   By: Julian Hy M.D.   On: 01/07/2021 01:50   DG Chest Portable 1 View  Result Date: 01/06/2021 CLINICAL DATA:  Cough, dizziness, abdominal pain, emesis, COVID-19 positive, history of non-Hodgkin lymphoma EXAM: PORTABLE CHEST 1 VIEW COMPARISON:  07/05/2015 FINDINGS: Single frontal view of the chest demonstrates an unremarkable cardiac silhouette. No airspace disease, effusion, or pneumothorax. No acute bony abnormalities. IMPRESSION: 1. No acute intrathoracic process. Electronically Signed   By: Randa Ngo M.D.   On: 01/06/2021 20:24     MDM   3:26 AM  Pt reports feeling generally weak, but continues to deny focal weakness, numbness.  Pt denies return of dizziness.  Pt is COVID + and reports fevers at home.    CT head without acute abnormality, but outside the 6 hour window for [facility].  Less likely given no associated headache.  Pt without constant dizziness, less likely cerebellar stroke. No acute neuro deficits.  Tolerating PO without difficulty and ambulates with steady gait. Will obtain MRI in AM.  Discussed with patient who is agreeable to plan.   The patient was discussed with and evaluated by Dr. Wyvonnia Dusky who agrees with the treatment plan.  BP  102/67   Pulse (!) 58   Temp (!) 97.4 F (36.3 C) (Oral)   Resp 14   Ht 5\' 6"  (1.676 m)   Wt 98.5 kg   SpO2 95%   BMI 35.05 kg/m   6:47 AM At shift change care was transferred to [facility] who will follow pending studies, re-evaulate and determine disposition.     BP 108/90  Pulse 65   Temp (!) 97.4 F (36.3 C) (Oral)   Resp 15   Ht 5\' 6"  (1.676 m)   Wt 98.5 kg   SpO2 97%   BMI 35.05 kg/m       Abigail Butts, Hershal Coria 01/07/21 2694    Ezequiel Essex, MD 01/07/21 2334

## 2021-01-07 NOTE — ED Notes (Addendum)
Pt ambulated and maintained O2 saturation at or above 96%. No reaction observed from medication

## 2021-01-07 NOTE — ED Notes (Signed)
Unable to update vitals pt not in room

## 2021-01-07 NOTE — ED Provider Notes (Signed)
Care  transferred from previous provider at shift change.  See note for full HPI.  COVID pos wed, Dizziness with emesis, no ha, resolved  Non focal exam, no recurrent dizziness while in ED  Got MAB  FU on MR brain and MRA  If neg dc home if neg   Physical Exam  BP 108/90   Pulse 65   Temp (!) 97.4 F (36.3 C) (Oral)   Resp 15   Ht 5\' 6"  (1.676 m)   Wt 98.5 kg   SpO2 97%   BMI 35.05 kg/m   Physical Exam Vitals and nursing note reviewed.  Constitutional:      General: She is not in acute distress.    Appearance: She is well-developed. She is not ill-appearing.  HENT:     Head: Atraumatic.  Eyes:     Pupils: Pupils are equal, round, and reactive to light.  Cardiovascular:     Rate and Rhythm: Normal rate.  Pulmonary:     Effort: No respiratory distress.     Breath sounds: Normal breath sounds and air entry.     Comments: Clear, speaks in full sentences without difficulty Abdominal:     General: There is no distension.  Musculoskeletal:        General: Normal range of motion.     Cervical back: Normal range of motion.  Skin:    General: Skin is warm and dry.  Neurological:     General: No focal deficit present.     Mental Status: She is alert.     Comments: Ambulatory without ataxic gait Equal handgrip bilaterally Cranial nerves II to XII grossly intact  Psychiatric:        Mood and Affect: Mood normal.    ED Course/Procedures   Clinical Course as of 01/07/21 1036  Sun Jan 06, 2021  2157 Creatinine(!): 2.04 CrCl calculated to be 39 mL/min using Cockcroft-Gault. [SJ]  2158 Spoke with pharmacist.  We reviewed the patient's med list and compared it to medications contraindicated with Paxlovid. States Paxlovid would be contraindicated in this patient due to her amlodipine. [SJ]    Clinical Course User Index [SJ] Joy, Shawn C, PA-C   Labs Reviewed  COMPREHENSIVE METABOLIC PANEL - Abnormal; Notable for the following components:      Result Value   Glucose,  Bld 111 (*)    BUN 30 (*)    Creatinine, Ser 2.04 (*)    GFR, Estimated 25 (*)    All other components within normal limits  CBC WITH DIFFERENTIAL/PLATELET  URINALYSIS, ROUTINE W REFLEX MICROSCOPIC   CT Head Wo Contrast  Result Date: 01/07/2021 CLINICAL DATA:  Dizziness, vomiting EXAM: CT HEAD WITHOUT CONTRAST TECHNIQUE: Contiguous axial images were obtained from the base of the skull through the vertex without intravenous contrast. COMPARISON:  CT head dated 08/31/2019 FINDINGS: Brain: No evidence of acute infarction, hemorrhage, hydrocephalus, extra-axial collection or mass lesion/mass effect. Mild subcortical white matter and periventricular small vessel ischemic changes. Basal ganglia calcifications, benign. Vascular: Intracranial atherosclerosis. Skull: Normal. Negative for fracture or focal lesion. Sinuses/Orbits: The visualized paranasal sinuses are essentially clear. The mastoid air cells are unopacified. Other: None. IMPRESSION: No evidence of acute intracranial abnormality. Mild small vessel ischemic changes. Electronically Signed   By: Julian Hy M.D.   On: 01/07/2021 01:50   MR ANGIO HEAD WO CONTRAST  Result Date: 01/07/2021 CLINICAL DATA:  Dizziness.  Vomiting.  Recent coronavirus infection. EXAM: MRI HEAD WITHOUT AND WITH CONTRAST MRA HEAD WITHOUT CONTRAST TECHNIQUE:  Multiplanar, multi-echo pulse sequences of the brain and surrounding structures were acquired without and with intravenous contrast. Angiographic images of the Circle of Willis were acquired using MRA technique without intravenous contrast. CONTRAST:  37mL GADAVIST GADOBUTROL 1 MMOL/ML IV SOLN COMPARISON:  Head CT 01/07/2021 FINDINGS: MRI HEAD FINDINGS Brain: Diffusion imaging does not show any acute or subacute infarction. No focal abnormality affects the brainstem or cerebellum. Cerebral hemispheres show moderate chronic small-vessel ischemic change of the deep and subcortical white matter. No cortical or large vessel  territory infarction. No mass lesion, hemorrhage, hydrocephalus or extra-axial collection. After contrast administration, no abnormal enhancement occurs. Vascular: Major vessels at the base of the brain show flow. Skull and upper cervical spine: Negative Sinuses/Orbits: Mild mucosal thickening of the paranasal sinuses. No advanced sinusitis. No layering fluid. Other: None MRA HEAD FINDINGS Both internal carotid arteries are widely patent into the brain. No siphon stenosis. The anterior and middle cerebral vessels are patent without proximal stenosis, aneurysm or vascular malformation. Both vertebral arteries are widely patent to the basilar. No basilar stenosis. Posterior circulation branch vessels appear normal. IMPRESSION: MRI head: No acute finding to explain the clinical presentation. Moderate chronic small-vessel ischemic changes of the cerebral hemispheric white matter. Mild mucosal inflammation of the paranasal sinuses without advanced finding. Normal intracranial MR angiography. Electronically Signed   By: Nelson Chimes M.D.   On: 01/07/2021 09:59   MR BRAIN W WO CONTRAST  Result Date: 01/07/2021 CLINICAL DATA:  Dizziness.  Vomiting.  Recent coronavirus infection. EXAM: MRI HEAD WITHOUT AND WITH CONTRAST MRA HEAD WITHOUT CONTRAST TECHNIQUE: Multiplanar, multi-echo pulse sequences of the brain and surrounding structures were acquired without and with intravenous contrast. Angiographic images of the Circle of Willis were acquired using MRA technique without intravenous contrast. CONTRAST:  77mL GADAVIST GADOBUTROL 1 MMOL/ML IV SOLN COMPARISON:  Head CT 01/07/2021 FINDINGS: MRI HEAD FINDINGS Brain: Diffusion imaging does not show any acute or subacute infarction. No focal abnormality affects the brainstem or cerebellum. Cerebral hemispheres show moderate chronic small-vessel ischemic change of the deep and subcortical white matter. No cortical or large vessel territory infarction. No mass lesion, hemorrhage,  hydrocephalus or extra-axial collection. After contrast administration, no abnormal enhancement occurs. Vascular: Major vessels at the base of the brain show flow. Skull and upper cervical spine: Negative Sinuses/Orbits: Mild mucosal thickening of the paranasal sinuses. No advanced sinusitis. No layering fluid. Other: None MRA HEAD FINDINGS Both internal carotid arteries are widely patent into the brain. No siphon stenosis. The anterior and middle cerebral vessels are patent without proximal stenosis, aneurysm or vascular malformation. Both vertebral arteries are widely patent to the basilar. No basilar stenosis. Posterior circulation branch vessels appear normal. IMPRESSION: MRI head: No acute finding to explain the clinical presentation. Moderate chronic small-vessel ischemic changes of the cerebral hemispheric white matter. Mild mucosal inflammation of the paranasal sinuses without advanced finding. Normal intracranial MR angiography. Electronically Signed   By: Nelson Chimes M.D.   On: 01/07/2021 09:59   DG Chest Portable 1 View  Result Date: 01/06/2021 CLINICAL DATA:  Cough, dizziness, abdominal pain, emesis, COVID-19 positive, history of non-Hodgkin lymphoma EXAM: PORTABLE CHEST 1 VIEW COMPARISON:  07/05/2015 FINDINGS: Single frontal view of the chest demonstrates an unremarkable cardiac silhouette. No airspace disease, effusion, or pneumothorax. No acute bony abnormalities. IMPRESSION: 1. No acute intrathoracic process. Electronically Signed   By: Randa Ngo M.D.   On: 01/06/2021 20:24     Procedures . MDM   FU on MR, plan to  dc home if neg  Patient reassessed.  She has been here in the emergency department for 16 hours.  Work-up reassuring.  Suspect patient's dizzy episode earlier likely due to orthostasis.  Imaging reassuring without any evidence of CVA, dissection.  Low suspicion for [facility].  She is tolerating p.o. intake and has a nonfocal neuro exam for myself.  I discussed labs and imaging  with patient.  All questions answered.  DC back home stable condition, discussed close follow-up with PCP or return for new or worsening symptoms  The patient has been appropriately medically screened and/or stabilized in the ED. I have low suspicion for any other emergent medical condition which would require further screening, evaluation or treatment in the ED or require inpatient management.  Patient is hemodynamically stable and in no acute distress.  Patient able to ambulate in department prior to ED.  Evaluation does not show acute pathology that would require ongoing or additional emergent interventions while in the emergency department or further inpatient treatment.  I have discussed the diagnosis with the patient and answered all questions.  Pain is been managed while in the emergency department and patient has no further complaints prior to discharge.  Patient is comfortable with plan discussed in room and is stable for discharge at this time.  I have discussed strict return precautions for returning to the emergency department.  Patient was encouraged to follow-up with PCP/specialist refer to at discharge.      Namiko Pritts A, PA-C 01/07/21 1039    Malvin Johns, MD 01/07/21 1055

## 2021-01-07 NOTE — ED Notes (Signed)
Pt given water 

## 2021-01-21 ENCOUNTER — Ambulatory Visit: Payer: Medicare PPO | Admitting: Internal Medicine

## 2021-01-25 ENCOUNTER — Other Ambulatory Visit: Payer: Self-pay

## 2021-01-25 ENCOUNTER — Encounter: Payer: Self-pay | Admitting: Internal Medicine

## 2021-01-25 ENCOUNTER — Ambulatory Visit: Payer: Medicare PPO | Admitting: Internal Medicine

## 2021-01-25 DIAGNOSIS — U071 COVID-19: Secondary | ICD-10-CM | POA: Diagnosis not present

## 2021-01-25 DIAGNOSIS — R059 Cough, unspecified: Secondary | ICD-10-CM | POA: Diagnosis not present

## 2021-01-25 MED ORDER — ALBUTEROL SULFATE HFA 108 (90 BASE) MCG/ACT IN AERS: 1.0000 | INHALATION_SPRAY | RESPIRATORY_TRACT | 3 refills | Status: DC | PRN

## 2021-01-25 NOTE — Progress Notes (Signed)
Subjective:  Patient ID: Christine Morales, female    DOB: 05/15/1946  Age: 75 y.o. MRN: [id number]  CC: Follow-up (4 month f/u)   HPI Christine Morales presents for COVID and cough - better F/u HTN  Outpatient Medications Prior to Visit  Medication Sig Dispense Refill   acetaminophen (TYLENOL) 500 MG tablet Take 1,000 mg by mouth 2 (two) times daily as needed for headache.     amLODipine (NORVASC) 5 MG tablet Take 1 tablet (5 mg total) by mouth daily. Must keep scheduled appt in August for future refills 90 tablet 0   Aspirin Effervescent (ALKA-SELTZER ORIGINAL PO) Take 1 tablet by mouth daily as needed (body aches).     Cholecalciferol (VITAMIN D) 50 MCG (2000 UT) tablet Take 2,000 Units by mouth daily.     ferrous sulfate 325 (65 FE) MG tablet Take 325 mg by mouth daily.     levothyroxine (SYNTHROID) 100 MCG tablet TAKE 1 TABLET BY MOUTH EVERY DAY (Patient taking differently: Take 100 mcg by mouth daily.) 90 tablet 3   losartan (COZAAR) 100 MG tablet Take 1 tablet (100 mg total) by mouth daily. Annual appt due in July must see provider for future refills 90 tablet 0   Multiple Vitamin (MULTIVITAMIN WITH MINERALS) TABS tablet Take 1 tablet by mouth daily. Geritol     vitamin B-12 (CYANOCOBALAMIN) 1000 MCG tablet Take 1,000 mcg by mouth daily.     albuterol (VENTOLIN HFA) 108 (90 Base) MCG/ACT inhaler Inhale 1-2 puffs into the lungs every 6 (six) hours as needed for wheezing or shortness of breath. (Patient not taking: Reported on 01/25/2021) 8 g 0   benzonatate (TESSALON) 100 MG capsule Take 1 capsule (100 mg total) by mouth every 8 (eight) hours. (Patient not taking: Reported on 01/25/2021) 21 capsule 0   Facility-Administered Medications Prior to Visit  Medication Dose Route Frequency Provider Last Rate Last Admin   heparin lock flush 100 unit/mL  500 Units Intravenous Once Shadad, Mathis Dad, MD       sodium chloride 0.9 % injection 10 mL  10 mL Intravenous PRN Wyatt Portela, MD       sodium  chloride 0.9 % injection 10 mL  10 mL Intravenous PRN Wyatt Portela, MD   10 mL at 05/20/17 0956    ROS: Review of Systems  Constitutional:  Negative for activity change, appetite change, chills, fatigue and unexpected weight change.  HENT:  Negative for congestion, mouth sores and sinus pressure.   Eyes:  Negative for visual disturbance.  Respiratory:  Negative for cough and chest tightness.   Gastrointestinal:  Negative for abdominal pain and nausea.  Genitourinary:  Negative for difficulty urinating, frequency and vaginal pain.  Musculoskeletal:  Negative for back pain and gait problem.  Skin:  Negative for pallor and rash.  Neurological:  Negative for dizziness, tremors, weakness, numbness and headaches.  Psychiatric/Behavioral:  Negative for confusion and sleep disturbance.    Objective:  BP 112/72 (BP Location: Left Arm)   Temp 98.6 F (37 C) (Oral)   Ht '5\' 6"'$  (1.676 m)   Wt 212 lb 9.6 oz (96.4 kg)   SpO2 97%   BMI 34.31 kg/m   BP Readings from Last 3 Encounters:  01/25/21 112/72  01/07/21 133/80  09/18/20 120/74    Wt Readings from Last 3 Encounters:  01/25/21 212 lb 9.6 oz (96.4 kg)  01/06/21 217 lb 2.5 oz (98.5 kg)  09/18/20 217 lb 3.2 oz (98.5 kg)  Physical Exam Constitutional:      General: She is not in acute distress.    Appearance: She is well-developed. She is obese.  HENT:     Head: Normocephalic.     Right Ear: External ear normal.     Left Ear: External ear normal.     Nose: Nose normal.  Eyes:     General:        Right eye: No discharge.        Left eye: No discharge.     Conjunctiva/sclera: Conjunctivae normal.     Pupils: Pupils are equal, round, and reactive to light.  Neck:     Thyroid: No thyromegaly.     Vascular: No JVD.     Trachea: No tracheal deviation.  Cardiovascular:     Rate and Rhythm: Normal rate and regular rhythm.     Heart sounds: Normal heart sounds.  Pulmonary:     Effort: No respiratory distress.     Breath  sounds: No stridor. No wheezing.  Abdominal:     General: Bowel sounds are normal. There is no distension.     Palpations: Abdomen is soft. There is no mass.     Tenderness: There is no abdominal tenderness. There is no guarding or rebound.  Musculoskeletal:        General: No tenderness.     Cervical back: Normal range of motion and neck supple. No rigidity.  Lymphadenopathy:     Cervical: No cervical adenopathy.  Skin:    Findings: No erythema or rash.  Neurological:     Cranial Nerves: No cranial nerve deficit.     Motor: No abnormal muscle tone.     Coordination: Coordination normal.     Deep Tendon Reflexes: Reflexes normal.  Psychiatric:        Behavior: Behavior normal.        Thought Content: Thought content normal.        Judgment: Judgment normal.    Lab Results  Component Value Date   WBC 4.4 01/06/2021   HGB 12.1 01/06/2021   HCT 36.8 01/06/2021   PLT 212 01/06/2021   GLUCOSE 111 (H) 01/06/2021   CHOL 216 (H) 01/16/2020   TRIG 159 (H) 01/16/2020   HDL 57 01/16/2020   LDLCALC 132 (H) 01/16/2020   ALT 15 01/06/2021   AST 22 01/06/2021   NA 139 01/06/2021   K 4.2 01/06/2021   CL 106 01/06/2021   CREATININE 2.04 (H) 01/06/2021   BUN 30 (H) 01/06/2021   CO2 24 01/06/2021   TSH 1.82 01/16/2020   INR 0.94 12/15/2017   HGBA1C 5.8 (H) 01/16/2020    CT Head Wo Contrast  Result Date: 01/07/2021 CLINICAL DATA:  Dizziness, vomiting EXAM: CT HEAD WITHOUT CONTRAST TECHNIQUE: Contiguous axial images were obtained from the base of the skull through the vertex without intravenous contrast. COMPARISON:  CT head dated 08/31/2019 FINDINGS: Brain: No evidence of acute infarction, hemorrhage, hydrocephalus, extra-axial collection or mass lesion/mass effect. Mild subcortical white matter and periventricular small vessel ischemic changes. Basal ganglia calcifications, benign. Vascular: Intracranial atherosclerosis. Skull: Normal. Negative for fracture or focal lesion.  Sinuses/Orbits: The visualized paranasal sinuses are essentially clear. The mastoid air cells are unopacified. Other: None. IMPRESSION: No evidence of acute intracranial abnormality. Mild small vessel ischemic changes. Electronically Signed   By: Julian Hy M.D.   On: 01/07/2021 01:50   MR ANGIO HEAD WO CONTRAST  Result Date: 01/07/2021 CLINICAL DATA:  Dizziness.  Vomiting.  Recent coronavirus  infection. EXAM: MRI HEAD WITHOUT AND WITH CONTRAST MRA HEAD WITHOUT CONTRAST TECHNIQUE: Multiplanar, multi-echo pulse sequences of the brain and surrounding structures were acquired without and with intravenous contrast. Angiographic images of the Circle of Willis were acquired using MRA technique without intravenous contrast. CONTRAST:  49m GADAVIST GADOBUTROL 1 MMOL/ML IV SOLN COMPARISON:  Head CT 01/07/2021 FINDINGS: MRI HEAD FINDINGS Brain: Diffusion imaging does not show any acute or subacute infarction. No focal abnormality affects the brainstem or cerebellum. Cerebral hemispheres show moderate chronic small-vessel ischemic change of the deep and subcortical white matter. No cortical or large vessel territory infarction. No mass lesion, hemorrhage, hydrocephalus or extra-axial collection. After contrast administration, no abnormal enhancement occurs. Vascular: Major vessels at the base of the brain show flow. Skull and upper cervical spine: Negative Sinuses/Orbits: Mild mucosal thickening of the paranasal sinuses. No advanced sinusitis. No layering fluid. Other: None MRA HEAD FINDINGS Both internal carotid arteries are widely patent into the brain. No siphon stenosis. The anterior and middle cerebral vessels are patent without proximal stenosis, aneurysm or vascular malformation. Both vertebral arteries are widely patent to the basilar. No basilar stenosis. Posterior circulation branch vessels appear normal. IMPRESSION: MRI head: No acute finding to explain the clinical presentation. Moderate chronic  small-vessel ischemic changes of the cerebral hemispheric white matter. Mild mucosal inflammation of the paranasal sinuses without advanced finding. Normal intracranial MR angiography. Electronically Signed   By: MNelson ChimesM.D.   On: 01/07/2021 09:59   MR BRAIN W WO CONTRAST  Result Date: 01/07/2021 CLINICAL DATA:  Dizziness.  Vomiting.  Recent coronavirus infection. EXAM: MRI HEAD WITHOUT AND WITH CONTRAST MRA HEAD WITHOUT CONTRAST TECHNIQUE: Multiplanar, multi-echo pulse sequences of the brain and surrounding structures were acquired without and with intravenous contrast. Angiographic images of the Circle of Willis were acquired using MRA technique without intravenous contrast. CONTRAST:  162mGADAVIST GADOBUTROL 1 MMOL/ML IV SOLN COMPARISON:  Head CT 01/07/2021 FINDINGS: MRI HEAD FINDINGS Brain: Diffusion imaging does not show any acute or subacute infarction. No focal abnormality affects the brainstem or cerebellum. Cerebral hemispheres show moderate chronic small-vessel ischemic change of the deep and subcortical white matter. No cortical or large vessel territory infarction. No mass lesion, hemorrhage, hydrocephalus or extra-axial collection. After contrast administration, no abnormal enhancement occurs. Vascular: Major vessels at the base of the brain show flow. Skull and upper cervical spine: Negative Sinuses/Orbits: Mild mucosal thickening of the paranasal sinuses. No advanced sinusitis. No layering fluid. Other: None MRA HEAD FINDINGS Both internal carotid arteries are widely patent into the brain. No siphon stenosis. The anterior and middle cerebral vessels are patent without proximal stenosis, aneurysm or vascular malformation. Both vertebral arteries are widely patent to the basilar. No basilar stenosis. Posterior circulation branch vessels appear normal. IMPRESSION: MRI head: No acute finding to explain the clinical presentation. Moderate chronic small-vessel ischemic changes of the cerebral  hemispheric white matter. Mild mucosal inflammation of the paranasal sinuses without advanced finding. Normal intracranial MR angiography. Electronically Signed   By: MaNelson Chimes.D.   On: 01/07/2021 09:59   DG Chest Portable 1 View  Result Date: 01/06/2021 CLINICAL DATA:  Cough, dizziness, abdominal pain, emesis, COVID-19 positive, history of non-Hodgkin lymphoma EXAM: PORTABLE CHEST 1 VIEW COMPARISON:  07/05/2015 FINDINGS: Single frontal view of the chest demonstrates an unremarkable cardiac silhouette. No airspace disease, effusion, or pneumothorax. No acute bony abnormalities. IMPRESSION: 1. No acute intrathoracic process. Electronically Signed   By: MiRanda Ngo.D.   On: 01/06/2021 20:24  Assessment & Plan:   There are no diagnoses linked to this encounter.   No orders of the defined types were placed in this encounter.    Follow-up: No follow-ups on file.  Walker Kehr, MD

## 2021-01-25 NOTE — Assessment & Plan Note (Signed)
Post Covid Proair HFA prn given

## 2021-03-12 ENCOUNTER — Other Ambulatory Visit: Payer: Self-pay | Admitting: Internal Medicine

## 2021-03-19 ENCOUNTER — Telehealth: Payer: Self-pay

## 2021-03-19 MED ORDER — AMLODIPINE BESYLATE 5 MG PO TABS: 5.0000 mg | ORAL_TABLET | Freq: Every day | ORAL | 1 refills | Status: DC

## 2021-03-19 MED ORDER — LEVOTHYROXINE SODIUM 100 MCG PO TABS: ORAL_TABLET | ORAL | 1 refills | Status: DC

## 2021-03-19 NOTE — Telephone Encounter (Signed)
Reviewed chart pt is up-to-date sent refills to [facility].../lmb  

## 2021-03-19 NOTE — Telephone Encounter (Signed)
1.Medication Requested: Levothyroxine and amlodipine  2. Pharmacy (Name, Street, [place]): [facility] [address]  3. On Med List: Yes  4. Last Visit with PCP: 01/25/2021    Agent: Please be advised that RX refills may take up to 3 business days. We ask that you follow-up with your pharmacy.

## 2021-03-27 ENCOUNTER — Other Ambulatory Visit: Payer: Self-pay | Admitting: Internal Medicine

## 2021-04-09 ENCOUNTER — Encounter: Payer: Self-pay | Admitting: Podiatry

## 2021-04-09 ENCOUNTER — Ambulatory Visit (INDEPENDENT_AMBULATORY_CARE_PROVIDER_SITE_OTHER): Payer: Medicare PPO

## 2021-04-09 ENCOUNTER — Ambulatory Visit: Payer: Medicare PPO | Admitting: Podiatry

## 2021-04-09 ENCOUNTER — Other Ambulatory Visit: Payer: Self-pay

## 2021-04-09 DIAGNOSIS — M778 Other enthesopathies, not elsewhere classified: Secondary | ICD-10-CM | POA: Diagnosis not present

## 2021-04-09 MED ORDER — DEXAMETHASONE SODIUM PHOSPHATE 120 MG/30ML IJ SOLN
2.0000 mg | Freq: Once | INTRAMUSCULAR | Status: AC
  Administered 2021-04-09: 2 mg via INTRA_ARTICULAR

## 2021-04-09 NOTE — Progress Notes (Signed)
She presents today chief complaint of a painful first metatarsal phalangeal joint left.  She states that is been mobic aching now for the past couple months  Objective: Vital signs are stable alert oriented x3 pulses are palpable.  Limited range of motion of the first metatarsophalangeal joint and review of her previous radiographs demonstrating bone to bone contact and mild elevation of the first metatarsal resulting in this hallux limitus.  Assessment: Hallux limitus first metatarsophalangeal joint left.  Plan: At this point I injected the first metatarsophalangeal joint with dexamethasone local anesthetic after Betadine skin prep tolerated procedure well we will follow-up with her on an as-needed basis for possible further injections of Kenalog at that point.  May need to consider surgical intervention.

## 2021-04-17 DIAGNOSIS — H2513 Age-related nuclear cataract, bilateral: Secondary | ICD-10-CM | POA: Diagnosis not present

## 2021-04-17 DIAGNOSIS — H52223 Regular astigmatism, bilateral: Secondary | ICD-10-CM | POA: Diagnosis not present

## 2021-04-17 DIAGNOSIS — H5213 Myopia, bilateral: Secondary | ICD-10-CM | POA: Diagnosis not present

## 2021-04-17 DIAGNOSIS — H0288B Meibomian gland dysfunction left eye, upper and lower eyelids: Secondary | ICD-10-CM | POA: Diagnosis not present

## 2021-04-17 DIAGNOSIS — H524 Presbyopia: Secondary | ICD-10-CM | POA: Diagnosis not present

## 2021-04-17 DIAGNOSIS — H35033 Hypertensive retinopathy, bilateral: Secondary | ICD-10-CM | POA: Diagnosis not present

## 2021-04-17 DIAGNOSIS — H0288A Meibomian gland dysfunction right eye, upper and lower eyelids: Secondary | ICD-10-CM | POA: Diagnosis not present

## 2021-04-17 DIAGNOSIS — H40013 Open angle with borderline findings, low risk, bilateral: Secondary | ICD-10-CM | POA: Diagnosis not present

## 2021-04-19 ENCOUNTER — Encounter: Payer: Self-pay | Admitting: Internal Medicine

## 2021-04-19 DIAGNOSIS — Z1231 Encounter for screening mammogram for malignant neoplasm of breast: Secondary | ICD-10-CM | POA: Diagnosis not present

## 2021-04-19 LAB — HM MAMMOGRAPHY

## 2021-04-22 ENCOUNTER — Encounter: Payer: Self-pay | Admitting: Internal Medicine

## 2021-05-13 NOTE — Progress Notes (Signed)
   Christine Morales [birth year] [id number]   History:  75 y.o. G2 P2 presents for breast and pelvic exam without GYN complaints. Postmenopausal - no HRT. S/P 2008 TAH for fibroids. Normal pap and mammogram history. Primary care manages hypothyroidism, HTN. 2011 diagnosed with non-hodgkin's lymphoma.   Gynecologic History No LMP recorded. Patient has had a hysterectomy.   Contraception: status post hysterectomy  Health maintenance Last Pap: 01/06/2014. Results were: Normal  Last mammogram: 04/19/2021. Results were: Normal Last colonoscopy: 04/11/2020. Results were: Tubular adenoma, no repeat recommended Last Dexa: 04/24/2020. Results were: Normal  Past medical history, past surgical history, family history and social history were all reviewed and documented in the EPIC chart. Widowed. Son in [place], daughter in [place]. 38 yo grandson lives with patient. 6 grandchildren. 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   ROS:  A ROS was performed and pertinent positives and negatives are included.  Exam:  Vitals:   05/14/21 0815  BP: 124/82  Weight: 213 lb (96.6 kg)  Height: 5\' 5"  (1.651 m)    Body mass index is 35.45 kg/m.  General appearance:  Normal Thyroid:  Symmetrical, normal in size, without palpable masses or nodularity. Respiratory  Auscultation:  Clear without wheezing or rhonchi Cardiovascular  Auscultation:  Regular rate, without rubs, murmurs or gallops  Edema/varicosities:  Not grossly evident Abdominal  Soft,nontender, without masses, guarding or rebound.  Liver/spleen:  No organomegaly noted  Hernia:  None appreciated  Skin  Inspection:  Grossly normal   Breasts: Examined lying and sitting. Bilateral breast reduction scars  Right: Without masses, retractions, discharge or axillary adenopathy.   Left: Without masses, retractions, discharge or axillary adenopathy. Gentitourinary   Inguinal/mons:  Normal without inguinal adenopathy  External genitalia:   Normal  BUS/Urethra/Skene's glands:  Normal  Vagina:  Normal, atrophic changes  Cervix:  Absent  Uterus:  Absent  Adnexa/parametria:     Rt: Without masses or tenderness.   Lt: Without masses or tenderness.  Anus and perineum: Normal, non-bleeding hemorrhoid  Digital rectal exam: Normal sphincter tone without palpated masses or tenderness  Patient informed chaperone available to be present for breast and pelvic exam. Patient has requested no chaperone to be present. Patient has been advised what will be completed during breast and pelvic exam.   Assessment/Plan:  76 y.o. G2 P2 for breast and pelvic exam.   Well female exam with routine gynecological exam - Education provided on SBEs, importance of preventative screenings, current guidelines, high calcium diet, regular exercise, and multivitamin daily. Labs with PCP.  Postmenopausal - no HRT. S/P 2008 TAH for fibroids.   Screening for cervical cancer - Normal Pap history. No longer screening per guidelines.   Screening for breast cancer - Normal mammogram history.  Continue annual screenings.  Normal breast exam today.  Screening for colon cancer - 2021 colonoscopy. Screenings no longer recommended.   Screening for osteoporosis - Normal bone density 04/2020. Managed by PCP. Continue Vitamin D + Calcium supplement. Increase exercise - [place].   Follow up in 2 years for breast and pelvic exam.       Tamela Gammon [facility], 8:42 AM 05/14/2021

## 2021-05-14 ENCOUNTER — Encounter: Payer: Self-pay | Admitting: Nurse Practitioner

## 2021-05-14 ENCOUNTER — Other Ambulatory Visit: Payer: Self-pay

## 2021-05-14 ENCOUNTER — Ambulatory Visit (INDEPENDENT_AMBULATORY_CARE_PROVIDER_SITE_OTHER): Payer: Medicare PPO | Admitting: Nurse Practitioner

## 2021-05-14 VITALS — BP 124/82 | Ht 65.0 in | Wt 213.0 lb

## 2021-05-14 DIAGNOSIS — Z01419 Encounter for gynecological examination (general) (routine) without abnormal findings: Secondary | ICD-10-CM | POA: Diagnosis not present

## 2021-05-14 DIAGNOSIS — Z780 Asymptomatic menopausal state: Secondary | ICD-10-CM

## 2021-06-24 ENCOUNTER — Other Ambulatory Visit: Payer: Self-pay | Admitting: Internal Medicine

## 2021-07-18 ENCOUNTER — Other Ambulatory Visit: Payer: Self-pay

## 2021-07-18 ENCOUNTER — Encounter: Payer: Self-pay | Admitting: Internal Medicine

## 2021-07-18 ENCOUNTER — Ambulatory Visit: Payer: Medicare PPO | Admitting: Internal Medicine

## 2021-07-18 DIAGNOSIS — E785 Hyperlipidemia, unspecified: Secondary | ICD-10-CM

## 2021-07-18 DIAGNOSIS — I10 Essential (primary) hypertension: Secondary | ICD-10-CM | POA: Diagnosis not present

## 2021-07-18 DIAGNOSIS — N1832 Chronic kidney disease, stage 3b: Secondary | ICD-10-CM

## 2021-07-18 DIAGNOSIS — E538 Deficiency of other specified B group vitamins: Secondary | ICD-10-CM

## 2021-07-18 DIAGNOSIS — E039 Hypothyroidism, unspecified: Secondary | ICD-10-CM | POA: Diagnosis not present

## 2021-07-18 LAB — COMPREHENSIVE METABOLIC PANEL
ALT: 12 U/L (ref 0–35)
AST: 19 U/L (ref 0–37)
Albumin: 4.3 g/dL (ref 3.5–5.2)
Alkaline Phosphatase: 79 U/L (ref 39–117)
BUN: 26 mg/dL — ABNORMAL HIGH (ref 6–23)
CO2: 28 mEq/L (ref 19–32)
Calcium: 9.8 mg/dL (ref 8.4–10.5)
Chloride: 104 mEq/L (ref 96–112)
Creatinine, Ser: 1.77 mg/dL — ABNORMAL HIGH (ref 0.40–1.20)
GFR: 28.2 mL/min — ABNORMAL LOW (ref >60.00)
Glucose, Bld: 85 mg/dL (ref 70–99)
Potassium: 4.3 mEq/L (ref 3.5–5.1)
Sodium: 141 mEq/L (ref 135–145)
Total Bilirubin: 0.6 mg/dL (ref 0.2–1.2)
Total Protein: 7.1 g/dL (ref 6.0–8.3)

## 2021-07-18 NOTE — Assessment & Plan Note (Signed)
Check CMET. 

## 2021-07-18 NOTE — Patient Instructions (Signed)

## 2021-07-18 NOTE — Progress Notes (Signed)
Subjective:  Patient ID: Christine Morales, female    DOB: Feb 27, 1946  Age: 76 y.o. MRN: [id number]  CC: Follow-up (Want a letter for jury duty)   HPI Christine Morales presents for fatty liver, CRI, B12 def, HTN  Outpatient Medications Prior to Visit  Medication Sig Dispense Refill   acetaminophen (TYLENOL) 500 MG tablet Take 1,000 mg by mouth 2 (two) times daily as needed for headache.     amLODipine (NORVASC) 5 MG tablet Take 1 tablet (5 mg total) by mouth daily. Annual appt due in Feb must see provider for future refills 90 tablet 1   Aspirin Effervescent (ALKA-SELTZER ORIGINAL PO) Take 1 tablet by mouth daily as needed (body aches).     Cholecalciferol (VITAMIN D) 50 MCG (2000 UT) tablet Take 2,000 Units by mouth daily.     ferrous sulfate 325 (65 FE) MG tablet Take 325 mg by mouth daily.     levothyroxine (SYNTHROID) 100 MCG tablet TAKE 1 TABLET BY MOUTH EVERY DAY 90 tablet 1   losartan (COZAAR) 100 MG tablet TAKE 1 TABLET BY MOUTH EVERY DAY 90 tablet 3   Multiple Vitamin (MULTIVITAMIN WITH MINERALS) TABS tablet Take 1 tablet by mouth daily. Geritol     vitamin B-12 (CYANOCOBALAMIN) 1000 MCG tablet Take 1,000 mcg by mouth daily.     albuterol (VENTOLIN HFA) 108 (90 Base) MCG/ACT inhaler Inhale 1-2 puffs into the lungs every 4 (four) hours as needed for wheezing or shortness of breath. (Patient not taking: Reported on 05/14/2021) 18 g 3   Facility-Administered Medications Prior to Visit  Medication Dose Route Frequency Provider Last Rate Last Admin   heparin lock flush 100 unit/mL  500 Units Intravenous Once Shadad, Mathis Dad, MD       sodium chloride 0.9 % injection 10 mL  10 mL Intravenous PRN Wyatt Portela, MD       sodium chloride 0.9 % injection 10 mL  10 mL Intravenous PRN Wyatt Portela, MD   10 mL at 05/20/17 0956    ROS: Review of Systems  Constitutional:  Negative for activity change, appetite change, chills, fatigue and unexpected weight change.  HENT:  Negative for  congestion, mouth sores and sinus pressure.   Eyes:  Negative for visual disturbance.  Respiratory:  Negative for cough and chest tightness.   Gastrointestinal:  Negative for abdominal pain and nausea.  Genitourinary:  Negative for difficulty urinating, frequency and vaginal pain.  Musculoskeletal:  Positive for arthralgias. Negative for back pain and gait problem.  Skin:  Negative for pallor and rash.  Neurological:  Negative for dizziness, tremors, weakness, numbness and headaches.  Psychiatric/Behavioral:  Negative for confusion and sleep disturbance.    Objective:  BP 112/68 (BP Location: Left Arm)    Pulse 70    Temp 98.1 F (36.7 C) (Oral)    Ht 5\' 5"  (1.651 m)    Wt 217 lb 6.4 oz (98.6 kg)    SpO2 97%    BMI 36.18 kg/m   BP Readings from Last 3 Encounters:  07/18/21 112/68  05/14/21 124/82  01/25/21 112/72    Wt Readings from Last 3 Encounters:  07/18/21 217 lb 6.4 oz (98.6 kg)  05/14/21 213 lb (96.6 kg)  01/25/21 212 lb 9.6 oz (96.4 kg)    Physical Exam Constitutional:      General: She is not in acute distress.    Appearance: She is well-developed. She is obese.  HENT:     Head: Normocephalic.  Right Ear: External ear normal.     Left Ear: External ear normal.     Nose: Nose normal.  Eyes:     General:        Right eye: No discharge.        Left eye: No discharge.     Conjunctiva/sclera: Conjunctivae normal.     Pupils: Pupils are equal, round, and reactive to light.  Neck:     Thyroid: No thyromegaly.     Vascular: No JVD.     Trachea: No tracheal deviation.  Cardiovascular:     Rate and Rhythm: Normal rate and regular rhythm.     Heart sounds: Normal heart sounds.  Pulmonary:     Effort: No respiratory distress.     Breath sounds: No stridor. No wheezing.  Abdominal:     General: Bowel sounds are normal. There is no distension.     Palpations: Abdomen is soft. There is no mass.     Tenderness: There is no abdominal tenderness. There is no guarding  or rebound.  Musculoskeletal:        General: Tenderness present.     Cervical back: Normal range of motion and neck supple. No rigidity.  Lymphadenopathy:     Cervical: No cervical adenopathy.  Skin:    Findings: No erythema or rash.  Neurological:     Mental Status: She is oriented to person, place, and time.     Cranial Nerves: No cranial nerve deficit.     Motor: No abnormal muscle tone.     Coordination: Coordination normal.     Deep Tendon Reflexes: Reflexes normal.  Psychiatric:        Behavior: Behavior normal.        Thought Content: Thought content normal.        Judgment: Judgment normal.  Knees are sensitive  Lab Results  Component Value Date   WBC 4.4 01/06/2021   HGB 12.1 01/06/2021   HCT 36.8 01/06/2021   PLT 212 01/06/2021   GLUCOSE 111 (H) 01/06/2021   CHOL 216 (H) 01/16/2020   TRIG 159 (H) 01/16/2020   HDL 57 01/16/2020   LDLCALC 132 (H) 01/16/2020   ALT 15 01/06/2021   AST 22 01/06/2021   NA 139 01/06/2021   K 4.2 01/06/2021   CL 106 01/06/2021   CREATININE 2.04 (H) 01/06/2021   BUN 30 (H) 01/06/2021   CO2 24 01/06/2021   TSH 1.82 01/16/2020   INR 0.94 12/15/2017   HGBA1C 5.8 (H) 01/16/2020    CT Head Wo Contrast  Result Date: 01/07/2021 CLINICAL DATA:  Dizziness, vomiting EXAM: CT HEAD WITHOUT CONTRAST TECHNIQUE: Contiguous axial images were obtained from the base of the skull through the vertex without intravenous contrast. COMPARISON:  CT head dated 08/31/2019 FINDINGS: Brain: No evidence of acute infarction, hemorrhage, hydrocephalus, extra-axial collection or mass lesion/mass effect. Mild subcortical white matter and periventricular small vessel ischemic changes. Basal ganglia calcifications, benign. Vascular: Intracranial atherosclerosis. Skull: Normal. Negative for fracture or focal lesion. Sinuses/Orbits: The visualized paranasal sinuses are essentially clear. The mastoid air cells are unopacified. Other: None. IMPRESSION: No evidence of acute  intracranial abnormality. Mild small vessel ischemic changes. Electronically Signed   By: Julian Hy M.D.   On: 01/07/2021 01:50   MR ANGIO HEAD WO CONTRAST  Result Date: 01/07/2021 CLINICAL DATA:  Dizziness.  Vomiting.  Recent coronavirus infection. EXAM: MRI HEAD WITHOUT AND WITH CONTRAST MRA HEAD WITHOUT CONTRAST TECHNIQUE: Multiplanar, multi-echo pulse sequences of the brain and  surrounding structures were acquired without and with intravenous contrast. Angiographic images of the Circle of Willis were acquired using MRA technique without intravenous contrast. CONTRAST:  90mL GADAVIST GADOBUTROL 1 MMOL/ML IV SOLN COMPARISON:  Head CT 01/07/2021 FINDINGS: MRI HEAD FINDINGS Brain: Diffusion imaging does not show any acute or subacute infarction. No focal abnormality affects the brainstem or cerebellum. Cerebral hemispheres show moderate chronic small-vessel ischemic change of the deep and subcortical white matter. No cortical or large vessel territory infarction. No mass lesion, hemorrhage, hydrocephalus or extra-axial collection. After contrast administration, no abnormal enhancement occurs. Vascular: Major vessels at the base of the brain show flow. Skull and upper cervical spine: Negative Sinuses/Orbits: Mild mucosal thickening of the paranasal sinuses. No advanced sinusitis. No layering fluid. Other: None MRA HEAD FINDINGS Both internal carotid arteries are widely patent into the brain. No siphon stenosis. The anterior and middle cerebral vessels are patent without proximal stenosis, aneurysm or vascular malformation. Both vertebral arteries are widely patent to the basilar. No basilar stenosis. Posterior circulation branch vessels appear normal. IMPRESSION: MRI head: No acute finding to explain the clinical presentation. Moderate chronic small-vessel ischemic changes of the cerebral hemispheric white matter. Mild mucosal inflammation of the paranasal sinuses without advanced finding. Normal  intracranial MR angiography. Electronically Signed   By: Nelson Chimes M.D.   On: 01/07/2021 09:59   MR BRAIN W WO CONTRAST  Result Date: 01/07/2021 CLINICAL DATA:  Dizziness.  Vomiting.  Recent coronavirus infection. EXAM: MRI HEAD WITHOUT AND WITH CONTRAST MRA HEAD WITHOUT CONTRAST TECHNIQUE: Multiplanar, multi-echo pulse sequences of the brain and surrounding structures were acquired without and with intravenous contrast. Angiographic images of the Circle of Willis were acquired using MRA technique without intravenous contrast. CONTRAST:  25mL GADAVIST GADOBUTROL 1 MMOL/ML IV SOLN COMPARISON:  Head CT 01/07/2021 FINDINGS: MRI HEAD FINDINGS Brain: Diffusion imaging does not show any acute or subacute infarction. No focal abnormality affects the brainstem or cerebellum. Cerebral hemispheres show moderate chronic small-vessel ischemic change of the deep and subcortical white matter. No cortical or large vessel territory infarction. No mass lesion, hemorrhage, hydrocephalus or extra-axial collection. After contrast administration, no abnormal enhancement occurs. Vascular: Major vessels at the base of the brain show flow. Skull and upper cervical spine: Negative Sinuses/Orbits: Mild mucosal thickening of the paranasal sinuses. No advanced sinusitis. No layering fluid. Other: None MRA HEAD FINDINGS Both internal carotid arteries are widely patent into the brain. No siphon stenosis. The anterior and middle cerebral vessels are patent without proximal stenosis, aneurysm or vascular malformation. Both vertebral arteries are widely patent to the basilar. No basilar stenosis. Posterior circulation branch vessels appear normal. IMPRESSION: MRI head: No acute finding to explain the clinical presentation. Moderate chronic small-vessel ischemic changes of the cerebral hemispheric white matter. Mild mucosal inflammation of the paranasal sinuses without advanced finding. Normal intracranial MR angiography. Electronically  Signed   By: Nelson Chimes M.D.   On: 01/07/2021 09:59   DG Chest Portable 1 View  Result Date: 01/06/2021 CLINICAL DATA:  Cough, dizziness, abdominal pain, emesis, COVID-19 positive, history of non-Hodgkin lymphoma EXAM: PORTABLE CHEST 1 VIEW COMPARISON:  07/05/2015 FINDINGS: Single frontal view of the chest demonstrates an unremarkable cardiac silhouette. No airspace disease, effusion, or pneumothorax. No acute bony abnormalities. IMPRESSION: 1. No acute intrathoracic process. Electronically Signed   By: Randa Ngo M.D.   On: 01/06/2021 20:24    Assessment & Plan:   Problem List Items Addressed This Visit     B12 deficiency  Mostly a vegetarian; occ chicken Risks associated with treatment noncompliance were discussed. Compliance was encouraged. On po B12      CRF (chronic renal failure)    Check CMET      Dyslipidemia    Monitor lipids A cardiac CT scan for calcium scoring was offered      Essential hypertension    On Amlodipine, Losartan      Hypothyroidism    On Levothroid Check TSH         No orders of the defined types were placed in this encounter.     Follow-up: Return in about 6 months (around 01/15/2022) for Wellness Exam.  Walker Kehr, MD

## 2021-07-18 NOTE — Assessment & Plan Note (Addendum)
Monitor lipids A cardiac CT scan for calcium scoring was offered

## 2021-07-18 NOTE — Assessment & Plan Note (Signed)
On Amlodipine, Losartan

## 2021-07-18 NOTE — Assessment & Plan Note (Signed)
Mostly a vegetarian; occ chicken Risks associated with treatment noncompliance were discussed. Compliance was encouraged. On po B12

## 2021-07-18 NOTE — Assessment & Plan Note (Signed)
On Levothroid Check TSH

## 2021-07-19 ENCOUNTER — Telehealth: Payer: Self-pay | Admitting: Internal Medicine

## 2021-07-19 NOTE — Telephone Encounter (Signed)
Type of form received (Home Health, FMLA, disability, handicapped placard, Surgical clearance) Jury duty  Form placed in (E-fax folder, Retail banker) Retail banker  Additional instructions from the patient (mail, fax, notify by phone when complete) Notify by phone when complete  Things to remember: [place] office: If form received in person, remind patient that forms take 7-10 business days CMA should attach charge sheet and put on [employer]

## 2021-07-19 NOTE — Telephone Encounter (Signed)
Generated Letter put in MD purple folder to sign...Christine Morales

## 2021-07-22 NOTE — Telephone Encounter (Signed)
Signed. Thank you.

## 2021-07-23 NOTE — Telephone Encounter (Signed)
Called pt there was no answer [facility] letter ready for pick-up.Marland KitchenJohny Chess

## 2021-08-16 ENCOUNTER — Encounter: Payer: Self-pay | Admitting: Nurse Practitioner

## 2021-08-16 ENCOUNTER — Other Ambulatory Visit: Payer: Self-pay

## 2021-08-16 ENCOUNTER — Ambulatory Visit: Payer: Medicare PPO | Admitting: Nurse Practitioner

## 2021-08-16 VITALS — BP 116/76 | HR 65 | Temp 97.9°F | Ht 65.0 in | Wt 220.0 lb

## 2021-08-16 DIAGNOSIS — R21 Rash and other nonspecific skin eruption: Secondary | ICD-10-CM | POA: Diagnosis not present

## 2021-08-16 NOTE — Progress Notes (Signed)
Subjective:  Patient ID: Christine Morales, female    DOB: 08-10-1945  Age: 76 y.o. MRN: [id number]  CC:  Chief Complaint  Patient presents with   Rash    Left side of neck for about a week. Pt has tried hydrocortisone cream and that didn't help, pharmacist also had her try something for ringworm and that didn't help either.      HPI  This patient arrives today for the above.  Rash erupted a week ago, it originally was pruritic but the itching has stopped.  She tells me the rash seems to be " drying up" and healing.  She denies any fluid-filled vesicles but reports a scaling type of rash.  It is located to the front left side of her neck.  She has tried over-the-counter hydrocortisone as well as an over-the-counter antifungal cream.  She reports having tried a new type of soap, but otherwise has no known exposures to new chemicals, plants, animals, etc. she does have a history of lymphoma in 2011, but completed treatment a few years ago.  She denies any unexplained fevers, unexplained weight loss, chest pain, shortness of breath, or worsening fatigue.  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 type   Heart disease Sister    Diabetes Brother    Hypertension Other        FH   Cholelithiasis Neg Hx    Colon cancer Neg Hx    Colon polyps Neg Hx    Esophageal cancer Neg Hx    Stomach cancer Neg Hx    Rectal cancer Neg Hx     Social History   Social History Narrative   Live with spouse; have 2 children;   Lives in the area   [place] children x 5;     Social History   Tobacco Use   Smoking status: Never   Smokeless tobacco: Never  Substance Use Topics   Alcohol use: No    Alcohol/week: 0.0 standard drinks     Current Meds  Medication Sig   acetaminophen (TYLENOL) 500 MG tablet Take 1,000 mg by mouth 2 (two) times daily as needed for headache.   albuterol (VENTOLIN HFA) 108 (90 Base) MCG/ACT inhaler Inhale 1-2 puffs into the lungs every 4 (four) hours as needed for wheezing or shortness of breath.   amLODipine (NORVASC) 5 MG tablet Take 1 tablet (5 mg total) by mouth daily. Annual appt due in Feb must see provider for future refills   Aspirin Effervescent (ALKA-SELTZER ORIGINAL PO) Take 1 tablet by mouth daily as needed (body aches).   Cholecalciferol (VITAMIN D) 50 MCG (2000 UT) tablet Take 2,000 Units by mouth daily.   ferrous sulfate 325 (65 FE) MG tablet Take 325 mg by mouth daily.   levothyroxine (SYNTHROID) 100 MCG tablet TAKE 1 TABLET BY MOUTH EVERY DAY   losartan (  COZAAR) 100 MG tablet TAKE 1 TABLET BY MOUTH EVERY DAY   Multiple Vitamin (MULTIVITAMIN WITH MINERALS) TABS tablet Take 1 tablet by mouth daily. Geritol   vitamin B-12 (CYANOCOBALAMIN) 1000 MCG tablet Take 1,000 mcg by mouth daily.    ROS:  Review of Systems  Constitutional:  Positive for malaise/fatigue (chronic, not any worse). Negative for fever and weight loss.  Respiratory:  Negative for shortness of breath.   Cardiovascular:  Negative for chest pain.  Skin:  Positive for itching (improving) and rash.    Objective:   Today's Vitals: BP 116/76 (BP Location: Left Arm, Patient Position: Sitting, Cuff Size: Large)    Pulse 65    Temp 97.9 F (36.6 C) (Oral)    Ht 5\' 5"  (1.651 m)    Wt 220 lb (99.8 kg)    SpO2 100%    BMI 36.61 kg/m  Vitals with BMI 08/16/2021 07/18/2021 05/14/2021  Height 5\' 5"  5\' 5"  5\' 5"   Weight 220 lbs 217 lbs 6 oz 213 lbs  BMI 36.61 21.11 55.20  Systolic 802 233 612  Diastolic 76 68 82  Pulse 65 70 -     Physical Exam Vitals  reviewed.  Constitutional:      General: She is not in acute distress.    Appearance: Normal appearance.  HENT:     Head: Normocephalic and atraumatic.  Neck:     Vascular: No carotid bruit.  Cardiovascular:     Rate and Rhythm: Normal rate and regular rhythm.     Pulses: Normal pulses.     Heart sounds: Normal heart sounds.  Pulmonary:     Effort: Pulmonary effort is normal.     Breath sounds: Normal breath sounds.  Skin:    General: Skin is warm and dry.       Neurological:     General: No focal deficit present.     Mental Status: She is alert and oriented to person, place, and time.  Psychiatric:        Mood and Affect: Mood normal.        Behavior: Behavior normal.        Judgment: Judgment normal.         Assessment and Plan   1. Rash      Plan: 1.  Appears the rash seems to be resolving on its own with use of the over-the-counter antifungal cream.  I encouraged her to continue using this cream and notify us if rash persist for 7 to 10 days.  Because of her history of lymphoma we will schedule her a follow-up in 2 weeks for close monitoring, she was encouraged to call us before her next appointment if rash seems to worsen.  Patient is agreeable to plan.   Tests ordered No orders of the defined types were placed in this encounter.     No orders of the defined types were placed in this encounter.   Patient to follow-up in 2 weeks for close monitoring of her rash, or sooner as needed.  Ailene Ards, NP

## 2021-08-30 ENCOUNTER — Ambulatory Visit: Payer: Medicare PPO | Admitting: Nurse Practitioner

## 2021-09-27 ENCOUNTER — Other Ambulatory Visit: Payer: Self-pay | Admitting: Internal Medicine

## 2021-09-30 ENCOUNTER — Other Ambulatory Visit: Payer: Self-pay | Admitting: Internal Medicine

## 2021-10-03 ENCOUNTER — Telehealth: Payer: Self-pay | Admitting: Internal Medicine

## 2021-10-03 NOTE — Telephone Encounter (Signed)
Pt requesting an order for labs prior to 01-13-2022 fu appt ?

## 2021-10-03 NOTE — Telephone Encounter (Signed)
No lab orders in.. Pt has medicare insurance pls advise.Marland KitchenJohny Chess ?

## 2021-10-04 ENCOUNTER — Telehealth: Payer: Self-pay | Admitting: Internal Medicine

## 2021-10-04 NOTE — Telephone Encounter (Signed)
Called pt no answer LMOM w/MD response../lmb 

## 2021-10-04 NOTE — Telephone Encounter (Signed)
Left message for patient to call back and schedule Medicare Annual Wellness Visit (AWV) to be completed by video or phone. ? ? ? ?Last AWV: 09/25/2020 ? ? ? ?Please schedule at anytime with [facility] Nurse ?Health Advisor   ? ? ? ?30 minute appointment ? ? ? ?Any questions, please contact me at [phone]  ?

## 2021-10-04 NOTE — Telephone Encounter (Signed)
If it is Medicare, we should order labs at the time of the visit.  Otherwise, the labs may not be covered. ?Thanks ?

## 2021-10-07 ENCOUNTER — Telehealth: Payer: Self-pay

## 2021-10-07 NOTE — Telephone Encounter (Signed)
Called patient no answer no machine,  patient may reschedule for next available appointment. ? ?L.Yareni Creps,LPN ?

## 2021-10-19 ENCOUNTER — Other Ambulatory Visit: Payer: Self-pay | Admitting: Internal Medicine

## 2021-10-25 DIAGNOSIS — Z6836 Body mass index (BMI) 36.0-36.9, adult: Secondary | ICD-10-CM | POA: Diagnosis not present

## 2021-10-25 DIAGNOSIS — Z833 Family history of diabetes mellitus: Secondary | ICD-10-CM | POA: Diagnosis not present

## 2021-10-25 DIAGNOSIS — E785 Hyperlipidemia, unspecified: Secondary | ICD-10-CM | POA: Diagnosis not present

## 2021-10-25 DIAGNOSIS — Z8572 Personal history of non-Hodgkin lymphomas: Secondary | ICD-10-CM | POA: Diagnosis not present

## 2021-10-25 DIAGNOSIS — E039 Hypothyroidism, unspecified: Secondary | ICD-10-CM | POA: Diagnosis not present

## 2021-10-25 DIAGNOSIS — K08109 Complete loss of teeth, unspecified cause, unspecified class: Secondary | ICD-10-CM | POA: Diagnosis not present

## 2021-10-25 DIAGNOSIS — M1990 Unspecified osteoarthritis, unspecified site: Secondary | ICD-10-CM | POA: Diagnosis not present

## 2021-10-25 DIAGNOSIS — I10 Essential (primary) hypertension: Secondary | ICD-10-CM | POA: Diagnosis not present

## 2021-10-30 ENCOUNTER — Encounter: Payer: Self-pay | Admitting: Internal Medicine

## 2021-10-30 ENCOUNTER — Ambulatory Visit: Payer: Medicare PPO | Admitting: Internal Medicine

## 2021-10-30 VITALS — BP 118/60 | HR 66 | Temp 97.8°F | Ht 65.0 in | Wt 217.0 lb

## 2021-10-30 DIAGNOSIS — E039 Hypothyroidism, unspecified: Secondary | ICD-10-CM | POA: Diagnosis not present

## 2021-10-30 DIAGNOSIS — N1832 Chronic kidney disease, stage 3b: Secondary | ICD-10-CM | POA: Diagnosis not present

## 2021-10-30 DIAGNOSIS — E538 Deficiency of other specified B group vitamins: Secondary | ICD-10-CM

## 2021-10-30 DIAGNOSIS — I10 Essential (primary) hypertension: Secondary | ICD-10-CM | POA: Diagnosis not present

## 2021-10-30 DIAGNOSIS — L309 Dermatitis, unspecified: Secondary | ICD-10-CM | POA: Insufficient documentation

## 2021-10-30 DIAGNOSIS — L308 Other specified dermatitis: Secondary | ICD-10-CM

## 2021-10-30 LAB — CBC WITH DIFFERENTIAL/PLATELET
Basophils Absolute: 0 10*3/uL (ref 0.0–0.1)
Basophils Relative: 0.6 % (ref 0.0–3.0)
Eosinophils Absolute: 0.1 10*3/uL (ref 0.0–0.7)
Eosinophils Relative: 2.4 % (ref 0.0–5.0)
HCT: 38.4 % (ref 36.0–46.0)
Hemoglobin: 12.7 g/dL (ref 12.0–15.0)
Lymphocytes Relative: 41.2 % (ref 12.0–46.0)
Lymphs Abs: 2.3 10*3/uL (ref 0.7–4.0)
MCHC: 33.1 g/dL (ref 30.0–36.0)
MCV: 85.7 fl (ref 78.0–100.0)
Monocytes Absolute: 0.5 10*3/uL (ref 0.1–1.0)
Monocytes Relative: 9.5 % (ref 3.0–12.0)
Neutro Abs: 2.5 10*3/uL (ref 1.4–7.7)
Neutrophils Relative %: 46.3 % (ref 43.0–77.0)
Platelets: 248 10*3/uL (ref 150.0–400.0)
RBC: 4.48 Mil/uL (ref 3.87–5.11)
RDW: 14.4 % (ref 11.5–15.5)
WBC: 5.5 10*3/uL (ref 4.0–10.5)

## 2021-10-30 LAB — COMPREHENSIVE METABOLIC PANEL
ALT: 11 U/L (ref 0–35)
AST: 16 U/L (ref 0–37)
Albumin: 4.3 g/dL (ref 3.5–5.2)
Alkaline Phosphatase: 75 U/L (ref 39–117)
BUN: 24 mg/dL — ABNORMAL HIGH (ref 6–23)
CO2: 26 mEq/L (ref 19–32)
Calcium: 9.6 mg/dL (ref 8.4–10.5)
Chloride: 107 mEq/L (ref 96–112)
Creatinine, Ser: 1.7 mg/dL — ABNORMAL HIGH (ref 0.40–1.20)
GFR: 29.54 mL/min — ABNORMAL LOW (ref >60.00)
Glucose, Bld: 62 mg/dL — ABNORMAL LOW (ref 70–99)
Potassium: 4 mEq/L (ref 3.5–5.1)
Sodium: 143 mEq/L (ref 135–145)
Total Bilirubin: 0.7 mg/dL (ref 0.2–1.2)
Total Protein: 7 g/dL (ref 6.0–8.3)

## 2021-10-30 LAB — URINALYSIS, ROUTINE W REFLEX MICROSCOPIC
Bilirubin Urine: NEGATIVE
Hgb urine dipstick: NEGATIVE
Ketones, ur: NEGATIVE
Nitrite: NEGATIVE
RBC / HPF: NONE SEEN (ref 0–?)
Specific Gravity, Urine: 1.01 (ref 1.000–1.030)
Total Protein, Urine: NEGATIVE
Urine Glucose: NEGATIVE
Urobilinogen, UA: 0.2 (ref 0.0–1.0)
pH: 6.5 (ref 5.0–8.0)

## 2021-10-30 LAB — LIPID PANEL
Cholesterol: 188 mg/dL (ref 0–200)
HDL: 54.2 mg/dL (ref >39.00)
LDL Cholesterol: 114 mg/dL — ABNORMAL HIGH (ref 0–99)
NonHDL: 134
Total CHOL/HDL Ratio: 3
Triglycerides: 102 mg/dL (ref 0.0–149.0)
VLDL: 20.4 mg/dL (ref 0.0–40.0)

## 2021-10-30 LAB — TSH: TSH: 0.94 u[IU]/mL (ref 0.35–5.50)

## 2021-10-30 MED ORDER — LEVOTHYROXINE SODIUM 100 MCG PO TABS: ORAL_TABLET | ORAL | 11 refills | Status: DC

## 2021-10-30 MED ORDER — CLOBETASOL PROPIONATE 0.05 % EX OINT: 1.0000 "application " | TOPICAL_OINTMENT | Freq: Two times a day (BID) | CUTANEOUS | 0 refills | Status: DC

## 2021-10-30 NOTE — Assessment & Plan Note (Signed)
Cont on Amlodipine, Losartan  

## 2021-10-30 NOTE — Assessment & Plan Note (Signed)
Monitoring GFR Hydrate well 

## 2021-10-30 NOTE — Progress Notes (Signed)
? ?Subjective:  ?Patient ID: Christine Morales, female    DOB: 1946-04-02  Age: 76 y.o. MRN: [id number] ? ?CC: No chief complaint on file. ? ? ?HPI ?Christine Morales presents for HTN, obesity, B12 def ? ?Outpatient Medications Prior to Visit  ?Medication Sig Dispense Refill  ? acetaminophen (TYLENOL) 500 MG tablet Take 1,000 mg by mouth 2 (two) times daily as needed for headache.    ? albuterol (VENTOLIN HFA) 108 (90 Base) MCG/ACT inhaler Inhale 1-2 puffs into the lungs every 4 (four) hours as needed for wheezing or shortness of breath. 18 g 3  ? amLODipine (NORVASC) 5 MG tablet TAKE 1 TABLET BY MOUTH DAILY 90 tablet 1  ? Aspirin Effervescent (ALKA-SELTZER ORIGINAL PO) Take 1 tablet by mouth daily as needed (body aches).    ? Cholecalciferol (VITAMIN D) 50 MCG (2000 UT) tablet Take 2,000 Units by mouth daily.    ? ferrous sulfate 325 (65 FE) MG tablet Take 325 mg by mouth daily.    ? losartan (COZAAR) 100 MG tablet TAKE 1 TABLET BY MOUTH EVERY DAY 90 tablet 3  ? Multiple Vitamin (MULTIVITAMIN WITH MINERALS) TABS tablet Take 1 tablet by mouth daily. Geritol    ? vitamin B-12 (CYANOCOBALAMIN) 1000 MCG tablet Take 1,000 mcg by mouth daily.    ? levothyroxine (SYNTHROID) 100 MCG tablet TAKE 1 TABLET BY MOUTH EVERY DAY 30 tablet 0  ? ?Facility-Administered Medications Prior to Visit  ?Medication Dose Route Frequency Provider Last Rate Last Admin  ? heparin lock flush 100 unit/mL  500 Units Intravenous Once Wyatt Portela, MD      ? sodium chloride 0.9 % injection 10 mL  10 mL Intravenous PRN Wyatt Portela, MD      ? sodium chloride 0.9 % injection 10 mL  10 mL Intravenous PRN Wyatt Portela, MD   10 mL at 05/20/17 0956  ? ? ?ROS: ?Review of Systems  ?Constitutional:  Negative for activity change, appetite change, chills, fatigue and unexpected weight change.  ?HENT:  Negative for congestion, mouth sores and sinus pressure.   ?Eyes:  Negative for visual disturbance.  ?Respiratory:  Negative for cough and chest tightness.    ?Gastrointestinal:  Negative for abdominal pain and nausea.  ?Genitourinary:  Negative for difficulty urinating, frequency and vaginal pain.  ?Musculoskeletal:  Negative for back pain and gait problem.  ?Skin:  Negative for pallor and rash.  ?Neurological:  Negative for dizziness, tremors, weakness, numbness and headaches.  ?Psychiatric/Behavioral:  Negative for confusion and sleep disturbance.   ? ?Objective:  ?BP 118/60 (BP Location: Left Arm, Patient Position: Sitting, Cuff Size: Normal)   Pulse 66   Temp 97.8 ?F (36.6 ?C) (Oral)   Ht '5\' 5"'$  (1.651 m)   Wt 217 lb (98.4 kg)   SpO2 98%   BMI 36.11 kg/m?  ? ?BP Readings from Last 3 Encounters:  ?10/30/21 118/60  ?08/16/21 116/76  ?07/18/21 112/68  ? ? ?Wt Readings from Last 3 Encounters:  ?10/30/21 217 lb (98.4 kg)  ?08/16/21 220 lb (99.8 kg)  ?07/18/21 217 lb 6.4 oz (98.6 kg)  ? ? ?Physical Exam ?Constitutional:   ?   General: She is not in acute distress. ?   Appearance: She is well-developed.  ?HENT:  ?   Head: Normocephalic.  ?   Right Ear: External ear normal.  ?   Left Ear: External ear normal.  ?   Nose: Nose normal.  ?Eyes:  ?   General:     ?  Right eye: No discharge.     ?   Left eye: No discharge.  ?   Conjunctiva/sclera: Conjunctivae normal.  ?   Pupils: Pupils are equal, round, and reactive to light.  ?Neck:  ?   Thyroid: No thyromegaly.  ?   Vascular: No JVD.  ?   Trachea: No tracheal deviation.  ?Cardiovascular:  ?   Rate and Rhythm: Normal rate and regular rhythm.  ?   Heart sounds: Normal heart sounds.  ?Pulmonary:  ?   Effort: No respiratory distress.  ?   Breath sounds: No stridor. No wheezing.  ?Abdominal:  ?   General: Bowel sounds are normal. There is no distension.  ?   Palpations: Abdomen is soft. There is no mass.  ?   Tenderness: There is no abdominal tenderness. There is no guarding or rebound.  ?Musculoskeletal:     ?   General: No tenderness.  ?   Cervical back: Normal range of motion and neck supple. No rigidity.   ?Lymphadenopathy:  ?   Cervical: No cervical adenopathy.  ?Skin: ?   Findings: No erythema or rash.  ?Neurological:  ?   Cranial Nerves: No cranial nerve deficit.  ?   Motor: No abnormal muscle tone.  ?   Coordination: Coordination normal.  ?   Deep Tendon Reflexes: Reflexes normal.  ?Psychiatric:     ?   Behavior: Behavior normal.     ?   Thought Content: Thought content normal.     ?   Judgment: Judgment normal.  ?Eczema patch on neck ?No HSM ? ? ?Lab Results  ?Component Value Date  ? WBC 4.4 01/06/2021  ? HGB 12.1 01/06/2021  ? HCT 36.8 01/06/2021  ? PLT 212 01/06/2021  ? GLUCOSE 85 07/18/2021  ? CHOL 216 (H) 01/16/2020  ? TRIG 159 (H) 01/16/2020  ? HDL 57 01/16/2020  ? LDLCALC 132 (H) 01/16/2020  ? ALT 12 07/18/2021  ? AST 19 07/18/2021  ? NA 141 07/18/2021  ? K 4.3 07/18/2021  ? CL 104 07/18/2021  ? CREATININE 1.77 (H) 07/18/2021  ? BUN 26 (H) 07/18/2021  ? CO2 28 07/18/2021  ? TSH 1.82 01/16/2020  ? INR 0.94 12/15/2017  ? HGBA1C 5.8 (H) 01/16/2020  ? ? ?CT Head Wo Contrast ? ?Result Date: 01/07/2021 ?CLINICAL DATA:  Dizziness, vomiting EXAM: CT HEAD WITHOUT CONTRAST TECHNIQUE: Contiguous axial images were obtained from the base of the skull through the vertex without intravenous contrast. COMPARISON:  CT head dated 08/31/2019 FINDINGS: Brain: No evidence of acute infarction, hemorrhage, hydrocephalus, extra-axial collection or mass lesion/mass effect. Mild subcortical white matter and periventricular small vessel ischemic changes. Basal ganglia calcifications, benign. Vascular: Intracranial atherosclerosis. Skull: Normal. Negative for fracture or focal lesion. Sinuses/Orbits: The visualized paranasal sinuses are essentially clear. The mastoid air cells are unopacified. Other: None. IMPRESSION: No evidence of acute intracranial abnormality. Mild small vessel ischemic changes. Electronically Signed   By: Julian Hy M.D.   On: 01/07/2021 01:50  ? ?MR ANGIO HEAD WO CONTRAST ? ?Result Date:  01/07/2021 ?CLINICAL DATA:  Dizziness.  Vomiting.  Recent coronavirus infection. EXAM: MRI HEAD WITHOUT AND WITH CONTRAST MRA HEAD WITHOUT CONTRAST TECHNIQUE: Multiplanar, multi-echo pulse sequences of the brain and surrounding structures were acquired without and with intravenous contrast. Angiographic images of the Circle of Willis were acquired using MRA technique without intravenous contrast. CONTRAST:  61m GADAVIST GADOBUTROL 1 MMOL/ML IV SOLN COMPARISON:  Head CT 01/07/2021 FINDINGS: MRI HEAD FINDINGS Brain: Diffusion imaging does  not show any acute or subacute infarction. No focal abnormality affects the brainstem or cerebellum. Cerebral hemispheres show moderate chronic small-vessel ischemic change of the deep and subcortical white matter. No cortical or large vessel territory infarction. No mass lesion, hemorrhage, hydrocephalus or extra-axial collection. After contrast administration, no abnormal enhancement occurs. Vascular: Major vessels at the base of the brain show flow. Skull and upper cervical spine: Negative Sinuses/Orbits: Mild mucosal thickening of the paranasal sinuses. No advanced sinusitis. No layering fluid. Other: None MRA HEAD FINDINGS Both internal carotid arteries are widely patent into the brain. No siphon stenosis. The anterior and middle cerebral vessels are patent without proximal stenosis, aneurysm or vascular malformation. Both vertebral arteries are widely patent to the basilar. No basilar stenosis. Posterior circulation branch vessels appear normal. IMPRESSION: MRI head: No acute finding to explain the clinical presentation. Moderate chronic small-vessel ischemic changes of the cerebral hemispheric white matter. Mild mucosal inflammation of the paranasal sinuses without advanced finding. Normal intracranial MR angiography. Electronically Signed   By: Nelson Chimes M.D.   On: 01/07/2021 09:59  ? ?MR BRAIN W WO CONTRAST ? ?Result Date: 01/07/2021 ?CLINICAL DATA:  Dizziness.  Vomiting.   Recent coronavirus infection. EXAM: MRI HEAD WITHOUT AND WITH CONTRAST MRA HEAD WITHOUT CONTRAST TECHNIQUE: Multiplanar, multi-echo pulse sequences of the brain and surrounding structures were acquired without and with intravenous contrast. An

## 2021-10-30 NOTE — Assessment & Plan Note (Signed)
On po Vit B12 

## 2021-10-30 NOTE — Assessment & Plan Note (Signed)
Eczema patch on neck - new since Feb 2023 ?Start Clobetasol  ?

## 2022-01-13 ENCOUNTER — Ambulatory Visit: Payer: Medicare PPO | Admitting: Internal Medicine

## 2022-01-14 ENCOUNTER — Telehealth: Payer: Self-pay | Admitting: Internal Medicine

## 2022-01-14 NOTE — Telephone Encounter (Signed)
LVM for pt rt my call to schedule AWV with [facility] call back # [phone]

## 2022-01-20 ENCOUNTER — Ambulatory Visit (INDEPENDENT_AMBULATORY_CARE_PROVIDER_SITE_OTHER): Payer: Medicare PPO

## 2022-01-20 VITALS — BP 116/70 | HR 63 | Temp 97.8°F | Resp 16 | Ht 65.0 in | Wt 213.6 lb

## 2022-01-20 DIAGNOSIS — Z0000 Encounter for general adult medical examination without abnormal findings: Secondary | ICD-10-CM | POA: Diagnosis not present

## 2022-01-20 NOTE — Progress Notes (Addendum)
Subjective:   Christine Morales is a 76 y.o. female who presents for Medicare Annual (Subsequent) preventive examination.  Review of Systems     Cardiac Risk Factors include: advanced age (>1mn, >>62women);dyslipidemia;family history of premature cardiovascular disease;hypertension;obesity (BMI >30kg/m2)     Objective:    Today's Vitals   01/20/22 0840  BP: 116/70  Pulse: 63  Resp: 16  Temp: 97.8 F (36.6 C)  TempSrc: Temporal  SpO2: 98%  Weight: 213 lb 9.6 oz (96.9 kg)  Height: '5\' 5"'$  (1.651 m)  PainSc: 0-No pain   Body mass index is 35.54 kg/m.     01/20/2022    9:08 AM 01/06/2021    6:49 PM 09/25/2020   12:41 PM 08/31/2019    7:22 PM 12/15/2017    7:58 AM 11/18/2016   10:15 AM 05/22/2016    8:33 AM  Advanced Directives  Does Patient Have a Medical Advance Directive? Yes Yes Yes No Yes No No  Type of Advance Directive Living will;Healthcare Power of AEstillOut of facility DNR (pink MOST or yellow form)  Healthcare Power of Attorney    Does patient want to make changes to medical advance directive? No - Patient declined  No - Patient declined  No - Patient declined    Copy of HFairviewin Chart? No - copy requested  No - copy requested  No - copy requested    Would patient like information on creating a medical advance directive?    No - Patient declined   No - Patient declined    Current Medications (verified) Outpatient Encounter Medications as of 01/20/2022  Medication Sig   acetaminophen (TYLENOL) 500 MG tablet Take 1,000 mg by mouth 2 (two) times daily as needed for headache.   albuterol (VENTOLIN HFA) 108 (90 Base) MCG/ACT inhaler Inhale 1-2 puffs into the lungs every 4 (four) hours as needed for wheezing or shortness of breath.   amLODipine (NORVASC) 5 MG tablet TAKE 1 TABLET BY MOUTH DAILY   Aspirin Effervescent (ALKA-SELTZER ORIGINAL PO) Take 1 tablet by mouth daily as needed  (body aches).   Cholecalciferol (VITAMIN D) 50 MCG (2000 UT) tablet Take 2,000 Units by mouth daily.   clobetasol ointment (TEMOVATE) 02.95% Apply 1 application. topically 2 (two) times daily.   ferrous sulfate 325 (65 FE) MG tablet Take 325 mg by mouth daily.   levothyroxine (SYNTHROID) 100 MCG tablet TAKE 1 TABLET BY MOUTH EVERY DAY   losartan (COZAAR) 100 MG tablet TAKE 1 TABLET BY MOUTH EVERY DAY   Multiple Vitamin (MULTIVITAMIN WITH MINERALS) TABS tablet Take 1 tablet by mouth daily. Geritol   vitamin B-12 (CYANOCOBALAMIN) 1000 MCG tablet Take 1,000 mcg by mouth daily.   Facility-Administered Encounter Medications as of 01/20/2022  Medication   heparin lock flush 100 unit/mL   sodium chloride 0.9 % injection 10 mL   sodium chloride 0.9 % injection 10 mL    Allergies (verified) Patient has no known allergies.   History: Past Medical History:  Diagnosis Date   Anemia    iron def   GERD (gastroesophageal reflux disease)    Gout    Helicobacter pylori ab+    Hemorrhoids    Hypertension    nhl dx'd 12/2009   chemo comp 2011   Non-Hodgkin lymphoma (HHaugen 12/2009   b cell lymphoma mesenteric dr mMarcello Fennelpartial [facility]      Rhinitis, allergic    Thyroid disease  Ulcer    peptic   Past Surgical History:  Procedure Laterality Date   ABDOMINAL HYSTERECTOMY     BREAST REDUCTION SURGERY     COLON SURGERY     partial colectomy   COLONOSCOPY  07/2006   IR REMOVAL TUN ACCESS W/ PORT W/O FL MOD SED  12/15/2017   PARTIAL COLECTOMY     dr blackmon   TUBAL LIGATION     Family History  Problem Relation Age of Onset   Heart disease Mother    Cancer Father        ? type   Heart disease Sister    Diabetes Brother    Hypertension Other        FH   Cholelithiasis Neg Hx    Colon cancer Neg Hx    Colon polyps Neg Hx    Esophageal cancer Neg Hx    Stomach cancer Neg Hx    Rectal cancer Neg Hx    Social History   Socioeconomic History   Marital status: Widowed    Spouse  name: Not on file   Number of children: 2   Years of education: Not on file   Highest education level: Not on file  Occupational History   Occupation: retired    Fish farm manager: Christine Morales  Tobacco Use   Smoking status: Never   Smokeless tobacco: Never  Vaping Use   Vaping Use: Never used  Substance and Sexual Activity   Alcohol use: No    Alcohol/week: 0.0 standard drinks of alcohol   Drug use: No   Sexual activity: Not Currently    Comment: intercourse age 52, less than 5 sexual partners, des neg  Other Topics Concern   Not on file  Social History Narrative   Live with spouse; have 2 children;   Lives in the area   Christine Morales children x 5;    Social Determinants of Health   Financial Resource Strain: Low Risk  (01/20/2022)   Overall Financial Resource Strain (CARDIA)    Difficulty of Paying Living Expenses: Not hard at all  Food Insecurity: No Food Insecurity (01/20/2022)   Hunger Vital Sign    Worried About Running Out of Food in the Last Year: Never true    [place] in the Last Year: Never true  Transportation Needs: No Transportation Needs (01/20/2022)   PRAPARE - Hydrologist (Medical): No    Lack of Transportation (Non-Medical): No  Physical Activity: Sufficiently Active (01/20/2022)   Exercise Vital Sign    Days of Exercise per Week: 5 days    Minutes of Exercise per Session: 30 min  Stress: No Stress Concern Present (01/20/2022)   Christine Morales    Feeling of Stress : Not at all  Social Connections: Moderately Integrated (01/20/2022)   Social Connection and Isolation Panel [NHANES]    Frequency of Communication with Friends and Family: More than three times a week    Frequency of Social Gatherings with Friends and Family: More than three times a week    Attends Religious Services: More than 4 times per year    Active Member of [employer] or Organizations: No    Attends  Music therapist: More than 4 times per year    Marital Status: Widowed    Tobacco Counseling Counseling given: Not Answered   Clinical Intake:  Pre-visit preparation completed: Yes  Pain : No/denies pain Pain Score: 0-No  pain     BMI - recorded: 35.54 Nutritional Status: BMI > 30  Obese Nutritional Risks: None Diabetes: No  How often do you need to have someone help you when you read instructions, pamphlets, or other written materials from your doctor or pharmacy?: 1 - Never What is the last grade level you completed in school?: HSG; some college  Diabetic? no  Interpreter Needed?: No  Information entered by :: Lisette Abu, LPN.   Activities of Daily Living    01/20/2022    9:12 AM  In your present state of health, do you have any difficulty performing the following activities:  Hearing? 0  Vision? 0  Difficulty concentrating or making decisions? 0  Walking or climbing stairs? 0  Dressing or bathing? 0  Doing errands, shopping? 0  Preparing Food and eating ? N  Using the Toilet? N  In the past six months, have you accidently leaked urine? N  Do you have problems with loss of bowel control? N  Managing your Medications? N  Managing your Finances? N  Housekeeping or managing your Housekeeping? N    Patient Care Team: Plotnikov, Evie Lacks, MD as PCP - General (Internal Medicine) Wyatt Portela, MD (Hematology and Oncology) Coralie Keens, MD (General Surgery) Irene Shipper, MD as Referring Physician (Gastroenterology) Garrel Ridgel, DPM as Consulting Physician (Podiatry) [place], P.A. as Consulting Physician (Ophthalmology)  Indicate any recent Medical Services you may have received from other than [facility] providers in the past year (date may be approximate).     Assessment:   This is a routine wellness examination for Christine Morales.  Hearing/Vision screen Hearing Screening - Comments:: Patient denied any hearing difficulty.    No hearing aids.  Vision Screening - Comments:: Patient does wear corrective lenses/contacts.  [facility] by: [facility]   Dietary issues and exercise activities discussed: Current Exercise Habits: Structured exercise class;Home exercise routine, Type of exercise: walking;treadmill;stretching;strength training/weights, Time (Minutes): 30, Frequency (Times/Week): 5, Weekly Exercise (Minutes/Week): 150, Intensity: Moderate   Goals Addressed             This Visit's Progress    Continue to go to [facility] and lose weight from my abdominal area.        Depression Screen    01/20/2022    8:41 AM 08/16/2021    8:02 AM 09/25/2020   12:58 PM 08/20/2020    9:22 AM 07/18/2019   10:02 AM 06/24/2017   10:11 AM 01/30/2015    9:40 AM  PHQ 2/9 Scores  PHQ - 2 Score 0 0 0 0 0 0 0  PHQ- 9 Score   0 0       Fall Risk    01/20/2022    9:12 AM 08/16/2021    8:02 AM 09/25/2020   12:43 PM 08/20/2020    9:23 AM 07/18/2019   10:02 AM  Fall Risk   Falls in the past year? 0 0 0 1 0  Number falls in past yr: 0 0 0 0   Injury with Fall? 0 0 0 1   Risk for fall due to : No Fall Risks  No Fall Risks Impaired balance/gait   Follow up Falls evaluation completed  Falls evaluation completed  Falls evaluation completed    [place]:  Any stairs in or around the home? No  If so, are there any without handrails? No  Home free of loose throw rugs in walkways, pet  beds, electrical cords, etc? Yes  Adequate lighting in your home to reduce risk of falls? Yes   ASSISTIVE DEVICES UTILIZED TO PREVENT FALLS:  Life alert? No  Use of a cane, walker or w/c? No  Grab bars in the bathroom? No  Shower chair or bench in shower? No  Elevated toilet seat or a handicapped toilet? Yes   TIMED UP AND GO:  Was the test performed? Yes .  Length of time to ambulate 10 feet: 6 sec.   Gait steady and fast without use of assistive device  Cognitive Function:    01/30/2015    9:41 AM   MMSE - Mini Mental State Exam  Not completed: Unable to complete        01/20/2022    9:12 AM  6CIT Screen  What Year? 0 points  What month? 0 points  What time? 0 points  Count back from 20 0 points  Months in reverse 0 points  Repeat phrase 0 points  Total Score 0 points    Immunizations Immunization History  Administered Date(s) Administered   Fluad Quad(high Dose 65+) 03/23/2020   Influenza, High Dose Seasonal PF 04/16/2015, 02/27/2016, 02/27/2016, 03/30/2017, 03/20/2019, 03/23/2021   Influenza,inj,Quad PF,6+ Mos 03/16/2014, 04/05/2018   PFIZER(Purple Top)SARS-COV-2 Vaccination 08/19/2019, 09/13/2019, 04/21/2020   Pneumococcal Conjugate-13 10/21/2017   Pneumococcal Polysaccharide-23 09/06/2012   Td 02/12/2015   Zoster Recombinat (Shingrix) 11/11/2021    TDAP status: Up to date  Flu Vaccine status: Up to date  Pneumococcal vaccine status: Up to date  Covid-19 vaccine status: Completed vaccines  Qualifies for Shingles Vaccine? Yes   Zostavax completed No   Shingrix Completed?: No.    Education has been provided regarding the importance of this vaccine. Patient has been advised to call insurance company to determine out of pocket expense if they have not yet received this vaccine. Advised may also receive vaccine at local pharmacy or Health Dept. Verbalized acceptance and understanding.  Screening Tests Health Maintenance  Topic Date Due   Hepatitis C Screening  Never done   Pneumonia Vaccine 63+ Years old (3 - PPSV23 or PCV20) 10/22/2018   COVID-19 Vaccine (4 - Booster for Pfizer series) 06/16/2020   Zoster Vaccines- Shingrix (2 of 2) 01/06/2022   INFLUENZA VACCINE  01/21/2022   MAMMOGRAM  04/19/2022   TETANUS/TDAP  02/11/2025   DEXA SCAN  Completed   HPV VACCINES  Aged Out   COLONOSCOPY (Pts 45-58yr Insurance coverage will need to be confirmed)  Discontinued    Health Maintenance  Health Maintenance Due  Topic Date Due   Hepatitis C Screening  Never  done   Pneumonia Vaccine 74 Years old (3 - PPSV23 or PCV20) 10/22/2018   COVID-19 Vaccine (4 - Booster for PBrewsterseries) 06/16/2020   Zoster Vaccines- Shingrix (2 of 2) 01/06/2022    Colorectal cancer screening: No longer required.   Mammogram status: Completed 04/19/2021. Repeat every year  Bone Density status: Completed 05/20/2020. Results reflect: Bone density results: NORMAL. Repeat every 5 years.  Lung Cancer Screening: (Low Dose CT Chest recommended if Age 74-80years, 30 pack-year currently smoking OR have quit w/in 15years.) does not qualify.   Lung Cancer Screening Referral: no  Additional Screening:  Hepatitis C Screening: does qualify; Completed no  Vision Screening: Recommended annual ophthalmology exams for early detection of glaucoma and other disorders of the eye. Is the patient up to date with their annual eye exam?  Yes  Who is the provider or what is the  name of the office in which the patient attends annual eye exams? [facility] If pt is not established with a provider, would they like to be referred to a provider to establish care? No .   Dental Screening: Recommended annual dental exams for proper oral hygiene  Community Resource Referral / Chronic Care Management: CRR required this visit?  No   CCM required this visit?  No      Plan:     I have personally reviewed and noted the following in the patient's chart:   Medical and social history Use of alcohol, tobacco or illicit drugs  Current medications and supplements including opioid prescriptions.  Functional ability and status Nutritional status Physical activity Advanced directives List of other physicians Hospitalizations, surgeries, and ER visits in previous 12 months Vitals Screenings to include cognitive, depression, and falls Referrals and appointments  In addition, I have reviewed and discussed with patient certain preventive protocols, quality metrics, and best practice  recommendations. A written personalized care plan for preventive services as well as general preventive health recommendations were provided to patient.     Sheral Flow, LPN   12/25/8887   Nurse Notes:  Hearing Screening - Comments:: Patient denied any hearing difficulty.   No hearing aids.  Vision Screening - Comments:: Patient does wear corrective lenses/contacts.  Eye exam done by: [place] screening examination/treatment/procedure(s) were performed by non-physician practitioner and as supervising physician I was immediately available for consultation/collaboration.  I agree with above. Lew Dawes, MD

## 2022-01-20 NOTE — Patient Instructions (Addendum)
Christine Morales , Thank you for taking time to come for your Medicare Wellness Visit. I appreciate your ongoing commitment to your health goals. Please review the following plan we discussed and let me know if I can assist you in the future.   Screening recommendations/referrals: Colonoscopy: Discontinued Mammogram: 04/19/2021; due every year Bone Density: 04/24/2020; due every 5 years Recommended yearly ophthalmology/optometry visit for glaucoma screening and checkup Recommended yearly dental visit for hygiene and checkup  Vaccinations: Influenza vaccine: 03/23/2021 Pneumococcal vaccine: 09/06/2012, 10/21/2017 Tdap vaccine: 02/12/2015 Shingles vaccine: 11/11/2021 Covid-19: 08/19/2019, 09/13/2019, 04/21/2020  Advanced directives: Yes; Please bring a copy of your health care power of attorney and living will to the office at your convenience.  Conditions/risks identified: Yes  Next appointment: Please schedule your next Medicare Wellness Visit with your Nurse Health Advisor in 1 year by calling [phone].   Preventive Care 80 Years and Older, Female Preventive care refers to lifestyle choices and visits with your health care provider that can promote health and wellness. What does preventive care include? A yearly physical exam. This is also called an annual well check. Dental exams once or twice a year. Routine eye exams. Ask your health care provider how often you should have your eyes checked. Personal lifestyle choices, including: Daily care of your teeth and gums. Regular physical activity. Eating a healthy diet. Avoiding tobacco and drug use. Limiting alcohol use. Practicing safe sex. Taking low-dose aspirin every day. Taking vitamin and mineral supplements as recommended by your health care provider. What happens during an annual well check? The services and screenings done by your health care provider during your annual well check will depend on your age, overall health, lifestyle  risk factors, and family history of disease. Counseling  Your health care provider may ask you questions about your: Alcohol use. Tobacco use. Drug use. Emotional well-being. Home and relationship well-being. Sexual activity. Eating habits. History of falls. Memory and ability to understand (cognition). Work and work Statistician. Reproductive health. Screening  You may have the following tests or measurements: Height, weight, and BMI. Blood pressure. Lipid and cholesterol levels. These may be checked every 5 years, or more frequently if you are over 47 years old. Skin check. Lung cancer screening. You may have this screening every year starting at age 50 if you have a 30-pack-year history of smoking and currently smoke or have quit within the past 15 years. Fecal occult blood test (FOBT) of the stool. You may have this test every year starting at age 28. Flexible sigmoidoscopy or colonoscopy. You may have a sigmoidoscopy every 5 years or a colonoscopy every 10 years starting at age 82. Hepatitis C blood test. Hepatitis B blood test. Sexually transmitted disease (STD) testing. Diabetes screening. This is done by checking your blood sugar (glucose) after you have not eaten for a while (fasting). You may have this done every 1-3 years. Bone density scan. This is done to screen for osteoporosis. You may have this done starting at age 69. Mammogram. This may be done every 1-2 years. Talk to your health care provider about how often you should have regular mammograms. Talk with your health care provider about your test results, treatment options, and if necessary, the need for more tests. Vaccines  Your health care provider may recommend certain vaccines, such as: Influenza vaccine. This is recommended every year. Tetanus, diphtheria, and acellular pertussis (Tdap, Td) vaccine. You may need a Td booster every 10 years. Zoster vaccine. You may need this after age  60. Pneumococcal  13-valent conjugate (PCV13) vaccine. One dose is recommended after age 45. Pneumococcal polysaccharide (PPSV23) vaccine. One dose is recommended after age 70. Talk to your health care provider about which screenings and vaccines you need and how often you need them. This information is not intended to replace advice given to you by your health care provider. Make sure you discuss any questions you have with your health care provider. Document Released: 07/06/2015 Document Revised: 02/27/2016 Document Reviewed: 04/10/2015 Elsevier Interactive Patient Education  2017 [employer] Prevention in the Home Falls can cause injuries. They can happen to people of all ages. There are many things you can do to make your home safe and to help prevent falls. What can I do on the outside of my home? Regularly fix the edges of walkways and driveways and fix any cracks. Remove anything that might make you trip as you walk through a door, such as a raised step or threshold. Trim any bushes or trees on the path to your home. Use bright outdoor lighting. Clear any walking paths of anything that might make someone trip, such as rocks or tools. Regularly check to see if handrails are loose or broken. Make sure that both sides of any steps have handrails. Any raised decks and porches should have guardrails on the edges. Have any leaves, snow, or ice cleared regularly. Use sand or salt on walking paths during winter. Clean up any spills in your garage right away. This includes oil or grease spills. What can I do in the bathroom? Use night lights. Install grab bars by the toilet and in the tub and shower. Do not use towel bars as grab bars. Use non-skid mats or decals in the tub or shower. If you need to sit down in the shower, use a plastic, non-slip stool. Keep the floor dry. Clean up any water that spills on the floor as soon as it happens. Remove soap buildup in the tub or shower regularly. Attach  bath mats securely with double-sided non-slip rug tape. Do not have throw rugs and other things on the floor that can make you trip. What can I do in the bedroom? Use night lights. Make sure that you have a light by your bed that is easy to reach. Do not use any sheets or blankets that are too big for your bed. They should not hang down onto the floor. Have a firm chair that has side arms. You can use this for support while you get dressed. Do not have throw rugs and other things on the floor that can make you trip. What can I do in the kitchen? Clean up any spills right away. Avoid walking on wet floors. Keep items that you use a lot in easy-to-reach places. If you need to reach something above you, use a strong step stool that has a grab bar. Keep electrical cords out of the way. Do not use floor polish or wax that makes floors slippery. If you must use wax, use non-skid floor wax. Do not have throw rugs and other things on the floor that can make you trip. What can I do with my stairs? Do not leave any items on the stairs. Make sure that there are handrails on both sides of the stairs and use them. Fix handrails that are broken or loose. Make sure that handrails are as long as the stairways. Check any carpeting to make sure that it is firmly attached to the stairs. Fix  any carpet that is loose or worn. Avoid having throw rugs at the top or bottom of the stairs. If you do have throw rugs, attach them to the floor with carpet tape. Make sure that you have a light switch at the top of the stairs and the bottom of the stairs. If you do not have them, ask someone to add them for you. What else can I do to help prevent falls? Wear shoes that: Do not have high heels. Have rubber bottoms. Are comfortable and fit you well. Are closed at the toe. Do not wear sandals. If you use a stepladder: Make sure that it is fully opened. Do not climb a closed stepladder. Make sure that both sides of the  stepladder are locked into place. Ask someone to hold it for you, if possible. Clearly mark and make sure that you can see: Any grab bars or handrails. First and last steps. Where the edge of each step is. Use tools that help you move around (mobility aids) if they are needed. These include: Canes. Walkers. Scooters. Crutches. Turn on the lights when you go into a dark area. Replace any light bulbs as soon as they burn out. Set up your furniture so you have a clear path. Avoid moving your furniture around. If any of your floors are uneven, fix them. If there are any pets around you, be aware of where they are. Review your medicines with your doctor. Some medicines can make you feel dizzy. This can increase your chance of falling. Ask your doctor what other things that you can do to help prevent falls. This information is not intended to replace advice given to you by your health care provider. Make sure you discuss any questions you have with your health care provider. Document Released: 04/05/2009 Document Revised: 11/15/2015 Document Reviewed: 07/14/2014 Elsevier Interactive Patient Education  2017 [employer].

## 2022-03-10 ENCOUNTER — Ambulatory Visit: Payer: Medicare PPO | Admitting: Internal Medicine

## 2022-03-19 ENCOUNTER — Other Ambulatory Visit: Payer: Self-pay | Admitting: Internal Medicine

## 2022-03-20 ENCOUNTER — Encounter: Payer: Self-pay | Admitting: Internal Medicine

## 2022-03-20 ENCOUNTER — Ambulatory Visit: Payer: Medicare PPO | Admitting: Internal Medicine

## 2022-03-20 DIAGNOSIS — I10 Essential (primary) hypertension: Secondary | ICD-10-CM

## 2022-03-20 DIAGNOSIS — E039 Hypothyroidism, unspecified: Secondary | ICD-10-CM

## 2022-03-20 DIAGNOSIS — J040 Acute laryngitis: Secondary | ICD-10-CM | POA: Diagnosis not present

## 2022-03-20 DIAGNOSIS — E538 Deficiency of other specified B group vitamins: Secondary | ICD-10-CM | POA: Diagnosis not present

## 2022-03-20 DIAGNOSIS — N1832 Chronic kidney disease, stage 3b: Secondary | ICD-10-CM

## 2022-03-20 LAB — CBC WITH DIFFERENTIAL/PLATELET
Basophils Absolute: 0 10*3/uL (ref 0.0–0.1)
Basophils Relative: 0.5 % (ref 0.0–3.0)
Eosinophils Absolute: 0.2 10*3/uL (ref 0.0–0.7)
Eosinophils Relative: 3 % (ref 0.0–5.0)
HCT: 36.5 % (ref 36.0–46.0)
Hemoglobin: 12.4 g/dL (ref 12.0–15.0)
Lymphocytes Relative: 32.6 % (ref 12.0–46.0)
Lymphs Abs: 2.1 10*3/uL (ref 0.7–4.0)
MCHC: 34 g/dL (ref 30.0–36.0)
MCV: 85.3 fl (ref 78.0–100.0)
Monocytes Absolute: 0.5 10*3/uL (ref 0.1–1.0)
Monocytes Relative: 7.8 % (ref 3.0–12.0)
Neutro Abs: 3.6 10*3/uL (ref 1.4–7.7)
Neutrophils Relative %: 56.1 % (ref 43.0–77.0)
Platelets: 254 10*3/uL (ref 150.0–400.0)
RBC: 4.27 Mil/uL (ref 3.87–5.11)
RDW: 14.3 % (ref 11.5–15.5)
WBC: 6.4 10*3/uL (ref 4.0–10.5)

## 2022-03-20 LAB — COMPREHENSIVE METABOLIC PANEL
ALT: 11 U/L (ref 0–35)
AST: 15 U/L (ref 0–37)
Albumin: 4 g/dL (ref 3.5–5.2)
Alkaline Phosphatase: 76 U/L (ref 39–117)
BUN: 20 mg/dL (ref 6–23)
CO2: 28 mEq/L (ref 19–32)
Calcium: 9.6 mg/dL (ref 8.4–10.5)
Chloride: 103 mEq/L (ref 96–112)
Creatinine, Ser: 1.76 mg/dL — ABNORMAL HIGH (ref 0.40–1.20)
GFR: 28.26 mL/min — ABNORMAL LOW (ref >60.00)
Glucose, Bld: 128 mg/dL — ABNORMAL HIGH (ref 70–99)
Potassium: 4 mEq/L (ref 3.5–5.1)
Sodium: 139 mEq/L (ref 135–145)
Total Bilirubin: 0.6 mg/dL (ref 0.2–1.2)
Total Protein: 6.8 g/dL (ref 6.0–8.3)

## 2022-03-20 LAB — TSH: TSH: 5.92 u[IU]/mL — ABNORMAL HIGH (ref 0.35–5.50)

## 2022-03-20 MED ORDER — AZITHROMYCIN 250 MG PO TABS: ORAL_TABLET | ORAL | 0 refills | Status: DC

## 2022-03-20 NOTE — Assessment & Plan Note (Signed)
On po Vit B12

## 2022-03-20 NOTE — Assessment & Plan Note (Signed)
Cont on Levothroid Check TSH

## 2022-03-20 NOTE — Assessment & Plan Note (Signed)
Monitoring GFR Hydrate well 

## 2022-03-20 NOTE — Assessment & Plan Note (Signed)
Cont on Amlodipine, Losartan  

## 2022-03-20 NOTE — Assessment & Plan Note (Addendum)
Acute Hydrate well Use Mucinex. Zpac if worse

## 2022-03-20 NOTE — Progress Notes (Signed)
Subjective:  Patient ID: Christine Morales, female    DOB: 02/16/1946  Age: 76 y.o. MRN: [id number]  CC: Follow-up (6 months f/u)   HPI Keshia W Stennis presents for URI sx's w/voice loss, congestion since Monday. No fever, ST I  Outpatient Medications Prior to Visit  Medication Sig Dispense Refill   acetaminophen (TYLENOL) 500 MG tablet Take 1,000 mg by mouth 2 (two) times daily as needed for headache.     albuterol (VENTOLIN HFA) 108 (90 Base) MCG/ACT inhaler Inhale 1-2 puffs into the lungs every 4 (four) hours as needed for wheezing or shortness of breath. 18 g 3   amLODipine (NORVASC) 5 MG tablet TAKE 1 TABLET BY MOUTH DAILY 90 tablet 1   Aspirin Effervescent (ALKA-SELTZER ORIGINAL PO) Take 1 tablet by mouth daily as needed (body aches).     Cholecalciferol (VITAMIN D) 50 MCG (2000 UT) tablet Take 2,000 Units by mouth daily.     clobetasol ointment (TEMOVATE) 0.09 % Apply 1 application. topically 2 (two) times daily. 45 g 0   ferrous sulfate 325 (65 FE) MG tablet Take 325 mg by mouth daily.     levothyroxine (SYNTHROID) 100 MCG tablet TAKE 1 TABLET BY MOUTH EVERY DAY 30 tablet 11   losartan (COZAAR) 100 MG tablet TAKE 1 TABLET BY MOUTH EVERY DAY 90 tablet 3   Multiple Vitamin (MULTIVITAMIN WITH MINERALS) TABS tablet Take 1 tablet by mouth daily. Geritol     vitamin B-12 (CYANOCOBALAMIN) 1000 MCG tablet Take 1,000 mcg by mouth daily.     Facility-Administered Medications Prior to Visit  Medication Dose Route Frequency Provider Last Rate Last Admin   heparin lock flush 100 unit/mL  500 Units Intravenous Once Shadad, Mathis Dad, MD       sodium chloride 0.9 % injection 10 mL  10 mL Intravenous PRN Wyatt Portela, MD       sodium chloride 0.9 % injection 10 mL  10 mL Intravenous PRN Wyatt Portela, MD   10 mL at 05/20/17 0956    ROS: Review of Systems  Constitutional:  Negative for activity change, appetite change, chills, fatigue and unexpected weight change.  HENT:  Positive for  congestion and voice change. Negative for mouth sores and sinus pressure.   Eyes:  Negative for visual disturbance.  Respiratory:  Negative for cough and chest tightness.   Gastrointestinal:  Negative for abdominal pain and nausea.  Genitourinary:  Negative for difficulty urinating, frequency and vaginal pain.  Musculoskeletal:  Negative for back pain and gait problem.  Skin:  Negative for pallor and rash.  Neurological:  Negative for dizziness, tremors, weakness, numbness and headaches.  Psychiatric/Behavioral:  Negative for confusion and sleep disturbance.     Objective:  BP 130/78 (BP Location: Left Arm)   Pulse 62   Temp 98 F (36.7 C) (Oral)   Ht '5\' 5"'$  (1.651 m)   Wt 211 lb 3.2 oz (95.8 kg)   SpO2 97%   BMI 35.15 kg/m   BP Readings from Last 3 Encounters:  03/20/22 130/78  01/20/22 116/70  10/30/21 118/60    Wt Readings from Last 3 Encounters:  03/20/22 211 lb 3.2 oz (95.8 kg)  01/20/22 213 lb 9.6 oz (96.9 kg)  10/30/21 217 lb (98.4 kg)    Physical Exam Constitutional:      General: She is not in acute distress.    Appearance: She is well-developed.  HENT:     Head: Normocephalic.     Right Ear:  External ear normal.     Left Ear: External ear normal.     Nose: Nose normal.  Eyes:     General:        Right eye: No discharge.        Left eye: No discharge.     Conjunctiva/sclera: Conjunctivae normal.     Pupils: Pupils are equal, round, and reactive to light.  Neck:     Thyroid: No thyromegaly.     Vascular: No JVD.     Trachea: No tracheal deviation.  Cardiovascular:     Rate and Rhythm: Normal rate and regular rhythm.     Heart sounds: Normal heart sounds.  Pulmonary:     Effort: No respiratory distress.     Breath sounds: No stridor. No wheezing.  Abdominal:     General: Bowel sounds are normal. There is no distension.     Palpations: Abdomen is soft. There is no mass.     Tenderness: There is no abdominal tenderness. There is no guarding or  rebound.  Musculoskeletal:        General: No tenderness.     Cervical back: Normal range of motion and neck supple. No rigidity.  Lymphadenopathy:     Cervical: No cervical adenopathy.  Skin:    Findings: No erythema or rash.  Neurological:     Cranial Nerves: No cranial nerve deficit.     Motor: No abnormal muscle tone.     Coordination: Coordination normal.     Deep Tendon Reflexes: Reflexes normal.  Psychiatric:        Behavior: Behavior normal.        Thought Content: Thought content normal.        Judgment: Judgment normal.     Lab Results  Component Value Date   WBC 5.5 10/30/2021   HGB 12.7 10/30/2021   HCT 38.4 10/30/2021   PLT 248.0 10/30/2021   GLUCOSE 62 (L) 10/30/2021   CHOL 188 10/30/2021   TRIG 102.0 10/30/2021   HDL 54.20 10/30/2021   LDLCALC 114 (H) 10/30/2021   ALT 11 10/30/2021   AST 16 10/30/2021   NA 143 10/30/2021   K 4.0 10/30/2021   CL 107 10/30/2021   CREATININE 1.70 (H) 10/30/2021   BUN 24 (H) 10/30/2021   CO2 26 10/30/2021   TSH 0.94 10/30/2021   INR 0.94 12/15/2017   HGBA1C 5.8 (H) 01/16/2020    MR BRAIN W WO CONTRAST  Result Date: 01/07/2021 CLINICAL DATA:  Dizziness.  Vomiting.  Recent coronavirus infection. EXAM: MRI HEAD WITHOUT AND WITH CONTRAST MRA HEAD WITHOUT CONTRAST TECHNIQUE: Multiplanar, multi-echo pulse sequences of the brain and surrounding structures were acquired without and with intravenous contrast. Angiographic images of the Circle of Willis were acquired using MRA technique without intravenous contrast. CONTRAST:  73m GADAVIST GADOBUTROL 1 MMOL/ML IV SOLN COMPARISON:  Head CT 01/07/2021 FINDINGS: MRI HEAD FINDINGS Brain: Diffusion imaging does not show any acute or subacute infarction. No focal abnormality affects the brainstem or cerebellum. Cerebral hemispheres show moderate chronic small-vessel ischemic change of the deep and subcortical white matter. No cortical or large vessel territory infarction. No mass lesion,  hemorrhage, hydrocephalus or extra-axial collection. After contrast administration, no abnormal enhancement occurs. Vascular: Major vessels at the base of the brain show flow. Skull and upper cervical spine: Negative Sinuses/Orbits: Mild mucosal thickening of the paranasal sinuses. No advanced sinusitis. No layering fluid. Other: None MRA HEAD FINDINGS Both internal carotid arteries are widely patent into the brain. No  siphon stenosis. The anterior and middle cerebral vessels are patent without proximal stenosis, aneurysm or vascular malformation. Both vertebral arteries are widely patent to the basilar. No basilar stenosis. Posterior circulation branch vessels appear normal. IMPRESSION: MRI head: No acute finding to explain the clinical presentation. Moderate chronic small-vessel ischemic changes of the cerebral hemispheric white matter. Mild mucosal inflammation of the paranasal sinuses without advanced finding. Normal intracranial MR angiography. Electronically Signed   By: Nelson Chimes M.D.   On: 01/07/2021 09:59   MR ANGIO HEAD WO CONTRAST  Result Date: 01/07/2021 CLINICAL DATA:  Dizziness.  Vomiting.  Recent coronavirus infection. EXAM: MRI HEAD WITHOUT AND WITH CONTRAST MRA HEAD WITHOUT CONTRAST TECHNIQUE: Multiplanar, multi-echo pulse sequences of the brain and surrounding structures were acquired without and with intravenous contrast. Angiographic images of the Circle of Willis were acquired using MRA technique without intravenous contrast. CONTRAST:  70m GADAVIST GADOBUTROL 1 MMOL/ML IV SOLN COMPARISON:  Head CT 01/07/2021 FINDINGS: MRI HEAD FINDINGS Brain: Diffusion imaging does not show any acute or subacute infarction. No focal abnormality affects the brainstem or cerebellum. Cerebral hemispheres show moderate chronic small-vessel ischemic change of the deep and subcortical white matter. No cortical or large vessel territory infarction. No mass lesion, hemorrhage, hydrocephalus or extra-axial  collection. After contrast administration, no abnormal enhancement occurs. Vascular: Major vessels at the base of the brain show flow. Skull and upper cervical spine: Negative Sinuses/Orbits: Mild mucosal thickening of the paranasal sinuses. No advanced sinusitis. No layering fluid. Other: None MRA HEAD FINDINGS Both internal carotid arteries are widely patent into the brain. No siphon stenosis. The anterior and middle cerebral vessels are patent without proximal stenosis, aneurysm or vascular malformation. Both vertebral arteries are widely patent to the basilar. No basilar stenosis. Posterior circulation branch vessels appear normal. IMPRESSION: MRI head: No acute finding to explain the clinical presentation. Moderate chronic small-vessel ischemic changes of the cerebral hemispheric white matter. Mild mucosal inflammation of the paranasal sinuses without advanced finding. Normal intracranial MR angiography. Electronically Signed   By: MNelson ChimesM.D.   On: 01/07/2021 09:59   CT Head Wo Contrast  Result Date: 01/07/2021 CLINICAL DATA:  Dizziness, vomiting EXAM: CT HEAD WITHOUT CONTRAST TECHNIQUE: Contiguous axial images were obtained from the base of the skull through the vertex without intravenous contrast. COMPARISON:  CT head dated 08/31/2019 FINDINGS: Brain: No evidence of acute infarction, hemorrhage, hydrocephalus, extra-axial collection or mass lesion/mass effect. Mild subcortical white matter and periventricular small vessel ischemic changes. Basal ganglia calcifications, benign. Vascular: Intracranial atherosclerosis. Skull: Normal. Negative for fracture or focal lesion. Sinuses/Orbits: The visualized paranasal sinuses are essentially clear. The mastoid air cells are unopacified. Other: None. IMPRESSION: No evidence of acute intracranial abnormality. Mild small vessel ischemic changes. Electronically Signed   By: SJulian HyM.D.   On: 01/07/2021 01:50   DG Chest Portable 1 View  Result  Date: 01/06/2021 CLINICAL DATA:  Cough, dizziness, abdominal pain, emesis, COVID-19 positive, history of non-Hodgkin lymphoma EXAM: PORTABLE CHEST 1 VIEW COMPARISON:  07/05/2015 FINDINGS: Single frontal view of the chest demonstrates an unremarkable cardiac silhouette. No airspace disease, effusion, or pneumothorax. No acute bony abnormalities. IMPRESSION: 1. No acute intrathoracic process. Electronically Signed   By: MRanda NgoM.D.   On: 01/06/2021 20:24    Assessment & Plan:   Problem List Items Addressed This Visit     B12 deficiency    On po Vit B12      CRF (chronic renal failure)    Monitoring GFR  Hydrate well      Essential hypertension    Cont on Amlodipine, Losartan      Hypothyroidism    Cont on Levothroid Check TSH      Laryngitis    Acute Hydrate well Use Mucinex. Zpac if worse         Meds ordered this encounter  Medications   azithromycin (ZITHROMAX Z-PAK) 250 MG tablet    Sig: As directed    Dispense:  6 tablet    Refill:  0      Follow-up: Return in about 6 months (around 09/18/2022) for Wellness Exam.  Walker Kehr, MD

## 2022-04-26 DIAGNOSIS — Z1231 Encounter for screening mammogram for malignant neoplasm of breast: Secondary | ICD-10-CM | POA: Diagnosis not present

## 2022-04-26 LAB — HM MAMMOGRAPHY

## 2022-04-29 ENCOUNTER — Encounter: Payer: Self-pay | Admitting: Internal Medicine

## 2022-05-08 ENCOUNTER — Encounter: Payer: Self-pay | Admitting: Internal Medicine

## 2022-07-15 ENCOUNTER — Encounter: Payer: Self-pay | Admitting: Podiatry

## 2022-07-15 ENCOUNTER — Ambulatory Visit: Payer: Medicare PPO | Admitting: Podiatry

## 2022-07-15 DIAGNOSIS — M778 Other enthesopathies, not elsewhere classified: Secondary | ICD-10-CM

## 2022-07-15 NOTE — Progress Notes (Signed)
She presents today states that she is still having pain at the first metatarsophalangeal joint of her left foot and that she really cannot wear her dress shoes.  She is also concerned about the third toe on the right foot the medial corner may have an ingrown or something.  Objective: Vital signs stable she is alert and oriented x 3 she still has osteoarthritis hallux valgus deformity and pain on range of motion with limited dorsiflexion of her left foot at the first metatarsal phalangeal joint.  Right foot demonstrates hallux valgus deformity lateralization of the second toe stepping on the third toe of the right foot which is resulting in the mild ingrown toenail which I clipped out for her today.  There is no erythema cellulitis drainage or odor no reproducible pain.  Assessment: Hallux limitus first metatarsophalangeal joint left foot.  Mild hammertoe deformity third digit right foot.  Plan: Recommended surgical intervention for the left foot.  Placed him silicone padding to the third toe right foot.

## 2022-09-11 ENCOUNTER — Encounter: Payer: Self-pay | Admitting: Oncology

## 2022-09-18 ENCOUNTER — Encounter: Payer: Self-pay | Admitting: Internal Medicine

## 2022-09-18 ENCOUNTER — Ambulatory Visit (INDEPENDENT_AMBULATORY_CARE_PROVIDER_SITE_OTHER): Payer: Medicare PPO | Admitting: Internal Medicine

## 2022-09-18 VITALS — BP 118/70 | HR 82 | Temp 98.2°F | Ht 65.0 in | Wt 209.0 lb

## 2022-09-18 DIAGNOSIS — N1832 Chronic kidney disease, stage 3b: Secondary | ICD-10-CM

## 2022-09-18 DIAGNOSIS — E538 Deficiency of other specified B group vitamins: Secondary | ICD-10-CM | POA: Diagnosis not present

## 2022-09-18 DIAGNOSIS — E039 Hypothyroidism, unspecified: Secondary | ICD-10-CM

## 2022-09-18 DIAGNOSIS — M25572 Pain in left ankle and joints of left foot: Secondary | ICD-10-CM

## 2022-09-18 DIAGNOSIS — I10 Essential (primary) hypertension: Secondary | ICD-10-CM

## 2022-09-18 DIAGNOSIS — Z0000 Encounter for general adult medical examination without abnormal findings: Secondary | ICD-10-CM | POA: Diagnosis not present

## 2022-09-18 LAB — CBC WITH DIFFERENTIAL/PLATELET
Basophils Absolute: 0 10*3/uL (ref 0.0–0.1)
Basophils Relative: 0.3 % (ref 0.0–3.0)
Eosinophils Absolute: 0.1 10*3/uL (ref 0.0–0.7)
Eosinophils Relative: 2.5 % (ref 0.0–5.0)
HCT: 38.8 % (ref 36.0–46.0)
Hemoglobin: 13.1 g/dL (ref 12.0–15.0)
Lymphocytes Relative: 46.5 % — ABNORMAL HIGH (ref 12.0–46.0)
Lymphs Abs: 2.3 10*3/uL (ref 0.7–4.0)
MCHC: 33.6 g/dL (ref 30.0–36.0)
MCV: 84.8 fl (ref 78.0–100.0)
Monocytes Absolute: 0.4 10*3/uL (ref 0.1–1.0)
Monocytes Relative: 8.2 % (ref 3.0–12.0)
Neutro Abs: 2.1 10*3/uL (ref 1.4–7.7)
Neutrophils Relative %: 42.5 % — ABNORMAL LOW (ref 43.0–77.0)
Platelets: 288 10*3/uL (ref 150.0–400.0)
RBC: 4.58 Mil/uL (ref 3.87–5.11)
RDW: 14.5 % (ref 11.5–15.5)
WBC: 5 10*3/uL (ref 4.0–10.5)

## 2022-09-18 LAB — COMPREHENSIVE METABOLIC PANEL
ALT: 12 U/L (ref 0–35)
AST: 17 U/L (ref 0–37)
Albumin: 4.3 g/dL (ref 3.5–5.2)
Alkaline Phosphatase: 79 U/L (ref 39–117)
BUN: 20 mg/dL (ref 6–23)
CO2: 29 mEq/L (ref 19–32)
Calcium: 10 mg/dL (ref 8.4–10.5)
Chloride: 104 mEq/L (ref 96–112)
Creatinine, Ser: 1.63 mg/dL — ABNORMAL HIGH (ref 0.40–1.20)
GFR: 30.88 mL/min — ABNORMAL LOW (ref >60.00)
Glucose, Bld: 89 mg/dL (ref 70–99)
Potassium: 4.1 mEq/L (ref 3.5–5.1)
Sodium: 140 mEq/L (ref 135–145)
Total Bilirubin: 0.6 mg/dL (ref 0.2–1.2)
Total Protein: 7.1 g/dL (ref 6.0–8.3)

## 2022-09-18 LAB — LIPID PANEL
Cholesterol: 197 mg/dL (ref 0–200)
HDL: 61.1 mg/dL (ref >39.00)
LDL Cholesterol: 114 mg/dL — ABNORMAL HIGH (ref 0–99)
NonHDL: 135.81
Total CHOL/HDL Ratio: 3
Triglycerides: 108 mg/dL (ref 0.0–149.0)
VLDL: 21.6 mg/dL (ref 0.0–40.0)

## 2022-09-18 LAB — URINALYSIS
Bilirubin Urine: NEGATIVE
Hgb urine dipstick: NEGATIVE
Ketones, ur: NEGATIVE
Leukocytes,Ua: NEGATIVE
Nitrite: NEGATIVE
Specific Gravity, Urine: 1.01 (ref 1.000–1.030)
Total Protein, Urine: NEGATIVE
Urine Glucose: NEGATIVE
Urobilinogen, UA: 0.2 (ref 0.0–1.0)
pH: 6 (ref 5.0–8.0)

## 2022-09-18 LAB — VITAMIN B12: Vitamin B-12: 1054 pg/mL — ABNORMAL HIGH (ref 211–911)

## 2022-09-18 LAB — TSH: TSH: 0.21 u[IU]/mL — ABNORMAL LOW (ref 0.35–5.50)

## 2022-09-18 MED ORDER — VITAMIN B-12 1000 MCG PO TABS: 1000.0000 ug | ORAL_TABLET | Freq: Every day | ORAL | 1 refills | Status: AC

## 2022-09-18 MED ORDER — LOSARTAN POTASSIUM 100 MG PO TABS: 100.0000 mg | ORAL_TABLET | Freq: Every day | ORAL | 3 refills | Status: DC

## 2022-09-18 MED ORDER — AMLODIPINE BESYLATE 5 MG PO TABS: 5.0000 mg | ORAL_TABLET | Freq: Every day | ORAL | 3 refills | Status: DC

## 2022-09-18 MED ORDER — VITAMIN D 50 MCG (2000 UT) PO TABS: 2000.0000 [IU] | ORAL_TABLET | Freq: Every day | ORAL | 3 refills | Status: AC

## 2022-09-18 MED ORDER — LEVOTHYROXINE SODIUM 100 MCG PO TABS: ORAL_TABLET | ORAL | 3 refills | Status: DC

## 2022-09-18 MED ORDER — FERROUS SULFATE 325 (65 FE) MG PO TABS: 325.0000 mg | ORAL_TABLET | Freq: Every day | ORAL | 1 refills | Status: AC

## 2022-09-18 NOTE — Assessment & Plan Note (Signed)
We discussed age appropriate health related issues, including available/recomended screening tests and vaccinations. Labs were ordered to be later reviewed . All questions were answered. We discussed one or more of the following - seat belt use, use of sunscreen/sun exposure exercise, safe sex, fall risk reduction, second hand smoke exposure, firearm use and storage, seat belt use, a need for adhering to healthy diet and exercise. Labs were ordered. All questions were answered. GYN, BDS, mammo - per Dr Juleen China, GYN Last colon 2021 - Dr Henrene Pastor  A cardiac CT scan for calcium scoring offered

## 2022-09-18 NOTE — Assessment & Plan Note (Signed)
On B12 

## 2022-09-18 NOTE — Assessment & Plan Note (Signed)
St 3b GFR 31 Monitoring GFR Hydrate well

## 2022-09-18 NOTE — Assessment & Plan Note (Signed)
Cont on Amlodipine, Losartan 

## 2022-09-18 NOTE — Progress Notes (Signed)
Subjective:  Patient ID: Christine Morales, female    DOB: [birth year]  Age: 77 y.o. MRN: [id number]  CC: Annual Exam (PHYSICAL)   HPI [name] presents for a well exam  Outpatient Medications Prior to Visit  Medication Sig Dispense Refill   acetaminophen (TYLENOL) 500 MG tablet Take 1,000 mg by mouth 2 (two) times daily as needed for headache.     albuterol (VENTOLIN HFA) 108 (90 Base) MCG/ACT inhaler Inhale 1-2 puffs into the lungs every 4 (four) hours as needed for wheezing or shortness of breath. 18 g 3   Aspirin Effervescent (ALKA-SELTZER ORIGINAL PO) Take 1 tablet by mouth daily as needed (body aches).     azithromycin (ZITHROMAX Z-PAK) 250 MG tablet As directed 6 tablet 0   clobetasol ointment (TEMOVATE) [id number] % Apply 1 application. topically 2 (two) times daily. 45 g 0   Multiple Vitamin (MULTIVITAMIN WITH MINERALS) TABS tablet Take 1 tablet by mouth daily. Geritol     amLODipine (NORVASC) 5 MG tablet TAKE 1 TABLET BY MOUTH DAILY 90 tablet 1   Cholecalciferol (VITAMIN D) 50 MCG (2000 UT) tablet Take 2,000 Units by mouth daily.     ferrous sulfate 325 (65 FE) MG tablet Take 325 mg by mouth daily.     levothyroxine (SYNTHROID) 100 MCG tablet TAKE 1 TABLET BY MOUTH EVERY DAY 30 tablet 11   losartan (COZAAR) 100 MG tablet TAKE 1 TABLET BY MOUTH EVERY DAY 90 tablet 3   vitamin B-12 (CYANOCOBALAMIN) 1000 MCG tablet Take 1,000 mcg by mouth daily.     Facility-Administered Medications Prior to Visit  Medication Dose Route Frequency Provider Last Rate Last Admin   heparin lock flush 100 unit/mL  500 Units Intravenous Once Wyatt Portela, MD       sodium chloride 0.9 % injection 10 mL  10 mL Intravenous PRN Wyatt Portela, MD       sodium chloride 0.9 % injection 10 mL  10 mL Intravenous PRN Wyatt Portela, MD   10 mL at 05/20/17 0956    ROS: Review of Systems  Objective:  BP 118/70 (BP Location: Left Arm, Patient Position: Sitting, Cuff Size: Large)   Pulse 82   Temp 98.2 F  (36.8 C) (Oral)   Ht 5\' 5"  (1.651 m)   Wt 209 lb (94.8 kg)   SpO2 99%   BMI 34.78 kg/m   BP Readings from Last 3 Encounters:  09/18/22 118/70  03/20/22 130/78  01/20/22 116/70    Wt Readings from Last 3 Encounters:  09/18/22 209 lb (94.8 kg)  03/20/22 211 lb 3.2 oz (95.8 kg)  01/20/22 213 lb 9.6 oz (96.9 kg)    Physical Exam  Lab Results  Component Value Date   WBC 5.0 09/18/2022   HGB 13.1 09/18/2022   HCT 38.8 09/18/2022   PLT 288.0 09/18/2022   GLUCOSE 89 09/18/2022   CHOL 197 09/18/2022   TRIG 108.0 09/18/2022   HDL 61.10 09/18/2022   LDLCALC 114 (H) 09/18/2022   ALT 12 09/18/2022   AST 17 09/18/2022   NA 140 09/18/2022   K 4.1 09/18/2022   CL 104 09/18/2022   CREATININE 1.63 (H) 09/18/2022   BUN 20 09/18/2022   CO2 29 09/18/2022   TSH 0.21 (L) 09/18/2022   INR 0.94 12/15/2017   HGBA1C 5.8 (H) 01/16/2020    MR BRAIN W WO CONTRAST  Result Date: 01/07/2021 CLINICAL DATA:  Dizziness.  Vomiting.  Recent coronavirus infection. EXAM: MRI HEAD  WITHOUT AND WITH CONTRAST MRA HEAD WITHOUT CONTRAST TECHNIQUE: Multiplanar, multi-echo pulse sequences of the brain and surrounding structures were acquired without and with intravenous contrast. Angiographic images of the Circle of Willis were acquired using MRA technique without intravenous contrast. CONTRAST:  16mL GADAVIST GADOBUTROL 1 MMOL/ML IV SOLN COMPARISON:  Head CT 01/07/2021 FINDINGS: MRI HEAD FINDINGS Brain: Diffusion imaging does not show any acute or subacute infarction. No focal abnormality affects the brainstem or cerebellum. Cerebral hemispheres show moderate chronic small-vessel ischemic change of the deep and subcortical white matter. No cortical or large vessel territory infarction. No mass lesion, hemorrhage, hydrocephalus or extra-axial collection. After contrast administration, no abnormal enhancement occurs. Vascular: Major vessels at the base of the brain show flow. Skull and upper cervical spine: Negative  Sinuses/Orbits: Mild mucosal thickening of the paranasal sinuses. No advanced sinusitis. No layering fluid. Other: None MRA HEAD FINDINGS Both internal carotid arteries are widely patent into the brain. No siphon stenosis. The anterior and middle cerebral vessels are patent without proximal stenosis, aneurysm or vascular malformation. Both vertebral arteries are widely patent to the basilar. No basilar stenosis. Posterior circulation branch vessels appear normal. IMPRESSION: MRI head: No acute finding to explain the clinical presentation. Moderate chronic small-vessel ischemic changes of the cerebral hemispheric white matter. Mild mucosal inflammation of the paranasal sinuses without advanced finding. Normal intracranial MR angiography. Electronically Signed   By: Nelson Chimes M.D.   On: 01/07/2021 09:59   MR ANGIO HEAD WO CONTRAST  Result Date: 01/07/2021 CLINICAL DATA:  Dizziness.  Vomiting.  Recent coronavirus infection. EXAM: MRI HEAD WITHOUT AND WITH CONTRAST MRA HEAD WITHOUT CONTRAST TECHNIQUE: Multiplanar, multi-echo pulse sequences of the brain and surrounding structures were acquired without and with intravenous contrast. Angiographic images of the Circle of Willis were acquired using MRA technique without intravenous contrast. CONTRAST:  58mL GADAVIST GADOBUTROL 1 MMOL/ML IV SOLN COMPARISON:  Head CT 01/07/2021 FINDINGS: MRI HEAD FINDINGS Brain: Diffusion imaging does not show any acute or subacute infarction. No focal abnormality affects the brainstem or cerebellum. Cerebral hemispheres show moderate chronic small-vessel ischemic change of the deep and subcortical white matter. No cortical or large vessel territory infarction. No mass lesion, hemorrhage, hydrocephalus or extra-axial collection. After contrast administration, no abnormal enhancement occurs. Vascular: Major vessels at the base of the brain show flow. Skull and upper cervical spine: Negative Sinuses/Orbits: Mild mucosal thickening of the  paranasal sinuses. No advanced sinusitis. No layering fluid. Other: None MRA HEAD FINDINGS Both internal carotid arteries are widely patent into the brain. No siphon stenosis. The anterior and middle cerebral vessels are patent without proximal stenosis, aneurysm or vascular malformation. Both vertebral arteries are widely patent to the basilar. No basilar stenosis. Posterior circulation branch vessels appear normal. IMPRESSION: MRI head: No acute finding to explain the clinical presentation. Moderate chronic small-vessel ischemic changes of the cerebral hemispheric white matter. Mild mucosal inflammation of the paranasal sinuses without advanced finding. Normal intracranial MR angiography. Electronically Signed   By: Nelson Chimes M.D.   On: 01/07/2021 09:59   CT Head Wo Contrast  Result Date: 01/07/2021 CLINICAL DATA:  Dizziness, vomiting EXAM: CT HEAD WITHOUT CONTRAST TECHNIQUE: Contiguous axial images were obtained from the base of the skull through the vertex without intravenous contrast. COMPARISON:  CT head dated 08/31/2019 FINDINGS: Brain: No evidence of acute infarction, hemorrhage, hydrocephalus, extra-axial collection or mass lesion/mass effect. Mild subcortical white matter and periventricular small vessel ischemic changes. Basal ganglia calcifications, benign. Vascular: Intracranial atherosclerosis. Skull: Normal. Negative for  fracture or focal lesion. Sinuses/Orbits: The visualized paranasal sinuses are essentially clear. The mastoid air cells are unopacified. Other: None. IMPRESSION: No evidence of acute intracranial abnormality. Mild small vessel ischemic changes. Electronically Signed   By: Julian Hy M.D.   On: 01/07/2021 01:50   DG Chest Portable 1 View  Result Date: 01/06/2021 CLINICAL DATA:  Cough, dizziness, abdominal pain, emesis, COVID-19 positive, history of non-Hodgkin lymphoma EXAM: PORTABLE CHEST 1 VIEW COMPARISON:  07/05/2015 FINDINGS: Single frontal view of the chest  demonstrates an unremarkable cardiac silhouette. No airspace disease, effusion, or pneumothorax. No acute bony abnormalities. IMPRESSION: 1. No acute intrathoracic process. Electronically Signed   By: Randa Ngo M.D.   On: 01/06/2021 20:24    Assessment & Plan:   Problem List Items Addressed This Visit       Cardiovascular and Mediastinum   Essential hypertension    Cont on Amlodipine, Losartan      Relevant Medications   amLODipine (NORVASC) 5 MG tablet   losartan (COZAAR) 100 MG tablet   Other Relevant Orders   Urinalysis (Completed)   Lipid panel (Completed)     Endocrine   Hypothyroidism   Relevant Medications   levothyroxine (SYNTHROID) 100 MCG tablet   Other Relevant Orders   TSH (Completed)   Urinalysis (Completed)   CBC with Differential/Platelet (Completed)   Lipid panel (Completed)   Comprehensive metabolic panel (Completed)   T4, free   TSH     Genitourinary   CRF (chronic renal failure)    St 3b GFR 31 Monitoring GFR Hydrate well        Other   Well adult exam - Primary    We discussed age appropriate health related issues, including available/recomended screening tests and vaccinations. Labs were ordered to be later reviewed . All questions were answered. We discussed one or more of the following - seat belt use, use of sunscreen/sun exposure exercise, safe sex, fall risk reduction, second hand smoke exposure, firearm use and storage, seat belt use, a need for adhering to healthy diet and exercise. Labs were ordered. All questions were answered. GYN, BDS, mammo - per Dr Juleen China, GYN Last colon 2021 - Dr Henrene Pastor  A cardiac CT scan for calcium scoring offered      Relevant Orders   TSH (Completed)   Urinalysis (Completed)   CBC with Differential/Platelet (Completed)   Lipid panel (Completed)   Comprehensive metabolic panel (Completed)   B12 deficiency    On B12      Relevant Orders   Vitamin B12 (Completed)   Arthralgia of foot, left     Blue-Emu cream -- use 2-3 times a day Good shoes         Meds ordered this encounter  Medications   amLODipine (NORVASC) 5 MG tablet    Sig: Take 1 tablet (5 mg total) by mouth daily.    Dispense:  90 tablet    Refill:  3   ferrous sulfate 325 (65 FE) MG tablet    Sig: Take 1 tablet (325 mg total) by mouth daily.    Dispense:  90 tablet    Refill:  1   levothyroxine (SYNTHROID) 100 MCG tablet    Sig: TAKE 1 TABLET BY MOUTH EVERY DAY    Dispense:  90 tablet    Refill:  3   losartan (COZAAR) 100 MG tablet    Sig: Take 1 tablet (100 mg total) by mouth daily.    Dispense:  90 tablet  Refill:  3   cyanocobalamin (VITAMIN B12) 1000 MCG tablet    Sig: Take 1 tablet (1,000 mcg total) by mouth daily.    Dispense:  90 tablet    Refill:  1   Cholecalciferol (VITAMIN D) 50 MCG (2000 UT) tablet    Sig: Take 1 tablet (2,000 Units total) by mouth daily.    Dispense:  90 tablet    Refill:  3      Follow-up: Return in about 6 months (around 03/21/2023) for a follow-up visit.  Walker Kehr, MD

## 2022-09-18 NOTE — Patient Instructions (Addendum)
Blue-Emu cream -- use 2-3 times a day ? ?

## 2022-09-18 NOTE — Assessment & Plan Note (Signed)
Blue-Emu cream -- use 2-3 times a day Good shoes

## 2022-11-03 DIAGNOSIS — M79661 Pain in right lower leg: Secondary | ICD-10-CM | POA: Diagnosis not present

## 2022-11-04 ENCOUNTER — Ambulatory Visit (HOSPITAL_COMMUNITY)
Admission: RE | Admit: 2022-11-04 | Discharge: 2022-11-04 | Disposition: A | Discharge status: Home / Self Care | Payer: Medicare PPO | Source: Ambulatory Visit | Attending: Cardiology | Admitting: Cardiology

## 2022-11-04 ENCOUNTER — Ambulatory Visit: Payer: Medicare PPO | Admitting: Emergency Medicine

## 2022-11-04 ENCOUNTER — Other Ambulatory Visit (HOSPITAL_COMMUNITY): Payer: Self-pay | Admitting: Family Medicine

## 2022-11-04 DIAGNOSIS — M79604 Pain in right leg: Secondary | ICD-10-CM

## 2022-11-05 DIAGNOSIS — M79661 Pain in right lower leg: Secondary | ICD-10-CM | POA: Diagnosis not present

## 2022-11-21 DIAGNOSIS — H40013 Open angle with borderline findings, low risk, bilateral: Secondary | ICD-10-CM | POA: Diagnosis not present

## 2022-11-21 DIAGNOSIS — H0288B Meibomian gland dysfunction left eye, upper and lower eyelids: Secondary | ICD-10-CM | POA: Diagnosis not present

## 2022-11-21 DIAGNOSIS — H2513 Age-related nuclear cataract, bilateral: Secondary | ICD-10-CM | POA: Diagnosis not present

## 2022-11-21 DIAGNOSIS — H524 Presbyopia: Secondary | ICD-10-CM | POA: Diagnosis not present

## 2022-11-21 DIAGNOSIS — H35033 Hypertensive retinopathy, bilateral: Secondary | ICD-10-CM | POA: Diagnosis not present

## 2022-11-21 DIAGNOSIS — H0288A Meibomian gland dysfunction right eye, upper and lower eyelids: Secondary | ICD-10-CM | POA: Diagnosis not present

## 2022-12-02 DIAGNOSIS — K760 Fatty (change of) liver, not elsewhere classified: Secondary | ICD-10-CM | POA: Diagnosis not present

## 2022-12-02 DIAGNOSIS — I129 Hypertensive chronic kidney disease with stage 1 through stage 4 chronic kidney disease, or unspecified chronic kidney disease: Secondary | ICD-10-CM | POA: Diagnosis not present

## 2022-12-02 DIAGNOSIS — Z809 Family history of malignant neoplasm, unspecified: Secondary | ICD-10-CM | POA: Diagnosis not present

## 2022-12-02 DIAGNOSIS — E669 Obesity, unspecified: Secondary | ICD-10-CM | POA: Diagnosis not present

## 2022-12-02 DIAGNOSIS — N189 Chronic kidney disease, unspecified: Secondary | ICD-10-CM | POA: Diagnosis not present

## 2022-12-02 DIAGNOSIS — M1990 Unspecified osteoarthritis, unspecified site: Secondary | ICD-10-CM | POA: Diagnosis not present

## 2022-12-02 DIAGNOSIS — Z8249 Family history of ischemic heart disease and other diseases of the circulatory system: Secondary | ICD-10-CM | POA: Diagnosis not present

## 2022-12-02 DIAGNOSIS — E039 Hypothyroidism, unspecified: Secondary | ICD-10-CM | POA: Diagnosis not present

## 2022-12-02 DIAGNOSIS — E785 Hyperlipidemia, unspecified: Secondary | ICD-10-CM | POA: Diagnosis not present

## 2022-12-16 ENCOUNTER — Ambulatory Visit: Payer: Medicare PPO | Admitting: Internal Medicine

## 2022-12-16 ENCOUNTER — Encounter: Payer: Self-pay | Admitting: Internal Medicine

## 2022-12-16 VITALS — BP 118/70 | HR 63 | Temp 98.3°F | Ht 65.0 in | Wt 211.0 lb

## 2022-12-16 DIAGNOSIS — S86811A Strain of other muscle(s) and tendon(s) at lower leg level, right leg, initial encounter: Secondary | ICD-10-CM

## 2022-12-16 DIAGNOSIS — S86819S Strain of other muscle(s) and tendon(s) at lower leg level, unspecified leg, sequela: Secondary | ICD-10-CM | POA: Diagnosis not present

## 2022-12-16 DIAGNOSIS — E039 Hypothyroidism, unspecified: Secondary | ICD-10-CM

## 2022-12-16 NOTE — Assessment & Plan Note (Signed)
Cont on Levothroid 

## 2022-12-16 NOTE — Patient Instructions (Signed)
Blue-Emu cream - use 2-3 times a day Elastic sleeve for your calf

## 2022-12-16 NOTE — Assessment & Plan Note (Signed)
New swelling and pain in the R leg since mid-May 2024. Pt saw Dr Nolen Mu, Ortho - no DVT on [place]. ?pulled calf.  The pt was given steroids - she did not take. MSK - better Use a sleeve Blue-Emu cream was recommended to use 2-3 times a day

## 2022-12-16 NOTE — Progress Notes (Signed)
Subjective:  Patient ID: Christine Morales, female    DOB: 03/14/1946  Age: 77 y.o. MRN: [id number]  CC: Edema (SWELLING IN RT LEG)   HPI Christine Morales presents for swelling and pain in the R leg since mid-May 2024. Pt saw Dr Nolen Mu, Ortho - no DVT on [place]. ?pulled calf.  The pt was given steroids - she did not take.   Outpatient Medications Prior to Visit  Medication Sig Dispense Refill   acetaminophen (TYLENOL) 500 MG tablet Take 1,000 mg by mouth 2 (two) times daily as needed for headache.     albuterol (VENTOLIN HFA) 108 (90 Base) MCG/ACT inhaler Inhale 1-2 puffs into the lungs every 4 (four) hours as needed for wheezing or shortness of breath. 18 g 3   amLODipine (NORVASC) 5 MG tablet Take 1 tablet (5 mg total) by mouth daily. 90 tablet 3   Aspirin Effervescent (ALKA-SELTZER ORIGINAL PO) Take 1 tablet by mouth daily as needed (body aches).     Cholecalciferol (VITAMIN D) 50 MCG (2000 UT) tablet Take 1 tablet (2,000 Units total) by mouth daily. 90 tablet 3   clobetasol ointment (TEMOVATE) 0.05 % Apply 1 application. topically 2 (two) times daily. 45 g 0   cyanocobalamin (VITAMIN B12) 1000 MCG tablet Take 1 tablet (1,000 mcg total) by mouth daily. 90 tablet 1   ferrous sulfate 325 (65 FE) MG tablet Take 1 tablet (325 mg total) by mouth daily. 90 tablet 1   levothyroxine (SYNTHROID) 100 MCG tablet TAKE 1 TABLET BY MOUTH EVERY DAY 90 tablet 3   losartan (COZAAR) 100 MG tablet Take 1 tablet (100 mg total) by mouth daily. 90 tablet 3   Multiple Vitamin (MULTIVITAMIN WITH MINERALS) TABS tablet Take 1 tablet by mouth daily. Geritol     azithromycin (ZITHROMAX Z-PAK) 250 MG tablet As directed 6 tablet 0   Facility-Administered Medications Prior to Visit  Medication Dose Route Frequency Provider Last Rate Last Admin   heparin lock flush 100 unit/mL  500 Units Intravenous Once Shadad, Blenda Nicely, MD       sodium chloride 0.9 % injection 10 mL  10 mL Intravenous PRN Benjiman Core, MD       sodium  chloride 0.9 % injection 10 mL  10 mL Intravenous PRN Benjiman Core, MD   10 mL at 05/20/17 0956    ROS: Review of Systems  Objective:  BP 118/70 (BP Location: Left Arm, Patient Position: Sitting, Cuff Size: Large)   Pulse 63   Temp 98.3 F (36.8 C) (Oral)   Ht 5\' 5"  (1.651 m)   Wt 211 lb (95.7 kg)   SpO2 98%   BMI 35.11 kg/m   BP Readings from Last 3 Encounters:  12/16/22 118/70  09/18/22 118/70  03/20/22 130/78    Wt Readings from Last 3 Encounters:  12/16/22 211 lb (95.7 kg)  09/18/22 209 lb (94.8 kg)  03/20/22 211 lb 3.2 oz (95.8 kg)    Physical Exam  Lab Results  Component Value Date   WBC 5.0 09/18/2022   HGB 13.1 09/18/2022   HCT 38.8 09/18/2022   PLT 288.0 09/18/2022   GLUCOSE 89 09/18/2022   CHOL 197 09/18/2022   TRIG 108.0 09/18/2022   HDL 61.10 09/18/2022   LDLCALC 114 (H) 09/18/2022   ALT 12 09/18/2022   AST 17 09/18/2022   NA 140 09/18/2022   K 4.1 09/18/2022   CL 104 09/18/2022   CREATININE 1.63 (H) 09/18/2022   BUN 20  09/18/2022   CO2 29 09/18/2022   TSH 0.21 (L) 09/18/2022   INR 0.94 12/15/2017   HGBA1C 5.8 (H) 01/16/2020    VAS [place] LOWER EXTREMITY VENOUS (DVT)  Result Date: 11/04/2022  Lower Venous DVT Study Patient Name:  Christine Morales  Date of Exam:   11/04/2022 Medical Rec #: [id number]     Accession #:    [id number] Date of Birth: 09-09-1945     Patient Gender: F Patient Age:   41 years Exam Location:  [facility] Procedure:      VAS [place] LOWER EXTREMITY VENOUS (DVT) Referring Phys: [facility] --------------------------------------------------------------------------------  Indications: Right posterior calf pain x 2 days. Patient remembers 1 week ago getting a charlie horse at night. She denies chest pain and SOB.  Comparison Study: None Performing Technologist: Alecia Mackin RVT, RDCS (AE), RDMS  Examination Guidelines: A complete evaluation includes B-mode imaging, spectral Doppler, color Doppler, and power Doppler as needed of all  accessible portions of each vessel. Bilateral testing is considered an integral part of a complete examination. Limited examinations for reoccurring indications may be performed as noted. The reflux portion of the exam is performed with the patient in reverse Trendelenburg.  +---------+---------------+---------+-----------+----------+--------------+ RIGHT    CompressibilityPhasicitySpontaneityPropertiesThrombus Aging +---------+---------------+---------+-----------+----------+--------------+ CFV      Full           Yes      Yes                                 +---------+---------------+---------+-----------+----------+--------------+ SFJ      Full           Yes      Yes                                 +---------+---------------+---------+-----------+----------+--------------+ FV Prox  Full           Yes      Yes                                 +---------+---------------+---------+-----------+----------+--------------+ FV Mid   Full           Yes      Yes                                 +---------+---------------+---------+-----------+----------+--------------+ FV DistalFull           Yes      Yes                                 +---------+---------------+---------+-----------+----------+--------------+ PFV      Full                                                        +---------+---------------+---------+-----------+----------+--------------+ POP      Full           Yes      Yes                                 +---------+---------------+---------+-----------+----------+--------------+  PTV      Full           Yes      Yes                                 +---------+---------------+---------+-----------+----------+--------------+ PERO     Full           Yes      Yes                                 +---------+---------------+---------+-----------+----------+--------------+ Gastroc  Full                                                         +---------+---------------+---------+-----------+----------+--------------+ GSV      Full           Yes      Yes                                 +---------+---------------+---------+-----------+----------+--------------+   +----+---------------+---------+-----------+----------+--------------+ LEFTCompressibilityPhasicitySpontaneityPropertiesThrombus Aging +----+---------------+---------+-----------+----------+--------------+ CFV Full           Yes      Yes                                 +----+---------------+---------+-----------+----------+--------------+   Findings reported to results faxed to Dominic McKinley's office at 10:15 am.  Summary: RIGHT: - No evidence of deep vein thrombosis in the lower extremity. No indirect evidence of obstruction proximal to the inguinal ligament. - No cystic structure found in the popliteal fossa. - Mild suprapatellar fluid seen.  LEFT: - No evidence of common femoral vein obstruction.  *See table(s) above for measurements and observations. Electronically signed by Waverly Ferrari MD on 11/04/2022 at 12:03:37 PM.    Final     Assessment & Plan:   Problem List Items Addressed This Visit     Hypothyroidism    Cont on Levothroid       Strain of calf muscle, sequela - Primary    New swelling and pain in the R leg since mid-May 2024. Pt saw Dr Nolen Mu, Ortho - no DVT on [place]. ?pulled calf.  The pt was given steroids - she did not take. MSK - better Use a sleeve Blue-Emu cream was recommended to use 2-3 times a day          No orders of the defined types were placed in this encounter.     Follow-up: Return for a follow-up visit.  Sonda Primes, MD

## 2023-01-19 ENCOUNTER — Ambulatory Visit: Payer: Medicare PPO

## 2023-01-19 VITALS — Wt 209.0 lb

## 2023-01-19 DIAGNOSIS — Z23 Encounter for immunization: Secondary | ICD-10-CM

## 2023-01-19 DIAGNOSIS — Z0000 Encounter for general adult medical examination without abnormal findings: Secondary | ICD-10-CM

## 2023-01-19 NOTE — Progress Notes (Addendum)
Subjective:   Christine Morales is a 77 y.o. female who presents for Medicare Annual (Subsequent) preventive examination.  Vital Signs: Unable to obtain new vitals due to this being a telehealth visit.   Visit Complete: Virtual  I connected with  Christine Morales on 01/19/23 by a audio enabled telemedicine application and verified that I am speaking with the correct person using two identifiers.  Patient Location: Home  Provider Location: Office/Clinic  I discussed the limitations of evaluation and management by telemedicine. The patient expressed understanding and agreed to proceed.   Review of Systems     Cardiac Risk Factors include: advanced age (>7men, >38 women);dyslipidemia;hypertension;obesity (BMI >30kg/m2)     Objective:    Today's Vitals   01/19/23 1138  Weight: 209 lb (94.8 kg)   Body mass index is 34.78 kg/m.     01/19/2023   11:46 AM 01/20/2022    9:08 AM 01/06/2021    6:49 PM 09/25/2020   12:41 PM 08/31/2019    7:22 PM 12/15/2017    7:58 AM 11/18/2016   10:15 AM  Advanced Directives  Does Patient Have a Medical Advance Directive? Yes Yes Yes Yes No Yes No  Type of Estate agent of [place];Living will Living will;Healthcare Power of [employer] Power of Attorney Living will;Healthcare Power of [place];Out of facility DNR (pink MOST or yellow form)  Healthcare Power of Attorney   Does patient want to make changes to medical advance directive?  No - Patient declined  No - Patient declined  No - Patient declined   Copy of Healthcare Power of Attorney in Chart? No - copy requested No - copy requested  No - copy requested  No - copy requested   Would patient like information on creating a medical advance directive?     No - Patient declined      Current Medications (verified) Outpatient Encounter Medications as of 01/19/2023  Medication Sig   acetaminophen (TYLENOL) 500 MG tablet Take 1,000 mg by mouth 2 (two) times daily as needed for  headache.   amLODipine (NORVASC) 5 MG tablet Take 1 tablet (5 mg total) by mouth daily.   Aspirin Effervescent (ALKA-SELTZER ORIGINAL PO) Take 1 tablet by mouth daily as needed (body aches).   Cholecalciferol (VITAMIN D) 50 MCG (2000 UT) tablet Take 1 tablet (2,000 Units total) by mouth daily.   cyanocobalamin (VITAMIN B12) 1000 MCG tablet Take 1 tablet (1,000 mcg total) by mouth daily.   levothyroxine (SYNTHROID) 100 MCG tablet TAKE 1 TABLET BY MOUTH EVERY DAY   losartan (COZAAR) 100 MG tablet Take 1 tablet (100 mg total) by mouth daily.   Multiple Vitamin (MULTIVITAMIN WITH MINERALS) TABS tablet Take 1 tablet by mouth daily. Geritol   ferrous sulfate 325 (65 FE) MG tablet Take 1 tablet (325 mg total) by mouth daily. (Patient not taking: Reported on 01/19/2023)   [DISCONTINUED] albuterol (VENTOLIN HFA) 108 (90 Base) MCG/ACT inhaler Inhale 1-2 puffs into the lungs every 4 (four) hours as needed for wheezing or shortness of breath.   [DISCONTINUED] clobetasol ointment (TEMOVATE) 0.05 % Apply 1 application. topically 2 (two) times daily.   Facility-Administered Encounter Medications as of 01/19/2023  Medication   heparin lock flush 100 unit/mL   sodium chloride 0.9 % injection 10 mL   sodium chloride 0.9 % injection 10 mL    Allergies (verified) Patient has no known allergies.   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 type   Heart disease Sister    Diabetes Brother    Hypertension Other        FH   Cholelithiasis Neg Hx    Colon cancer Neg Hx    Colon polyps Neg Hx    Esophageal cancer Neg Hx    Stomach cancer Neg Hx    Rectal cancer Neg Hx    Social History   Socioeconomic History   Marital status: Widowed    Spouse name: Not on file   Number of children: 2   Years of education: Not on file   Highest education level: Not on file  Occupational History   Occupation: retired    Associate Professor: [employer]  Tobacco Use   Smoking status: Never   Smokeless tobacco: Never  Vaping Use   Vaping status: Never Used  Substance and Sexual Activity   Alcohol use: No    Alcohol/week: 0.0 standard drinks of alcohol   Drug use: No   Sexual activity: Not Currently    Comment: intercourse age 71, less than 5 sexual partners, des neg  Other Topics Concern   Not on file  Social History Narrative   Live with spouse; have 2 children;   Lives in the area   [place] children x 5;    Social Determinants of Health   Financial Resource Strain: Low Risk  (01/19/2023)   Overall Financial Resource Strain (CARDIA)    Difficulty of Paying Living Expenses: Not hard at all  Food Insecurity: No Food Insecurity (01/19/2023)   Hunger Vital Sign    Worried About Running Out of Food in the Last Year: Never true    Ran Out of Food in the Last Year: Never true  Transportation Needs: No Transportation Needs (01/19/2023)   PRAPARE - Administrator, Civil Service (Medical): No    Lack of Transportation (Non-Medical): No  Physical Activity: Sufficiently Active (01/19/2023)   Exercise Vital Sign    Days of Exercise per Week: 5 days    Minutes of Exercise per Session: 30 min  Stress: No Stress Concern Present (01/19/2023)   [employer] of  Occupational Health - Occupational Stress Questionnaire    Feeling of Stress : Not at all  Social Connections: Moderately Integrated (01/19/2023)   Social Connection and Isolation Panel [NHANES]    Frequency of Communication with Friends and Family: More than three times a week    Frequency of Social Gatherings with Friends and Family: More than three times a week  Attends Religious Services: More than 4 times per year    Active Member of Clubs or Organizations: Yes    Attends Banker Meetings: 1 to 4 times per year    Marital Status: Widowed    Tobacco Counseling Counseling given: Not Answered   Clinical Intake:  Pre-visit preparation completed: Yes  Pain : No/denies pain     BMI - recorded: 34.78 Nutritional Status: BMI > 30  Obese Nutritional Risks: None Diabetes: No  How often do you need to have someone help you when you read instructions, pamphlets, or other written materials from your doctor or pharmacy?: 1 - Never  Interpreter Needed?: No  Information entered by :: Lanier Ensign, LPN   Activities of Daily Living    01/19/2023   11:41 AM 01/20/2022    9:12 AM  In your present state of health, do you have any difficulty performing the following activities:  Hearing? 0 0  Vision? 0 0  Difficulty concentrating or making decisions? 0 0  Walking or climbing stairs? 0 0  Dressing or bathing? 0 0  Doing errands, shopping? 0 0  Preparing Food and eating ? N N  Using the Toilet? N N  In the past six months, have you accidently leaked urine? N N  Do you have problems with loss of bowel control? N N  Managing your Medications? N N  Managing your Finances? N N  Housekeeping or managing your Housekeeping? N N    Patient Care Team: Plotnikov, Georgina Quint, MD as PCP - General (Internal Medicine) Benjiman Core, MD (Inactive) (Hematology and Oncology) Abigail Miyamoto, MD (General Surgery) Hilarie Fredrickson, MD as Referring Physician  (Gastroenterology) Elinor Parkinson, DPM as Consulting Physician (Podiatry) [facility] as Consulting Physician (Ophthalmology)  Indicate any recent Medical Services you may have received from other than [facility] providers in the past year (date may be approximate).     Assessment:   This is a routine wellness examination for Elspeth.  Hearing/Vision screen Hearing Screening - Comments:: Pt denies any hearing issues  Vision Screening - Comments:: Pt follows up with Dr Dione Booze for annual eye exams   Dietary issues and exercise activities discussed:     Goals Addressed             This Visit's Progress    Patient Stated       Working on losing weight and better diet        Depression Screen    01/19/2023   11:44 AM 09/18/2022    8:20 AM 01/20/2022    8:41 AM 08/16/2021    8:02 AM 09/25/2020   12:58 PM 08/20/2020    9:22 AM 07/18/2019   10:02 AM  PHQ 2/9 Scores  PHQ - 2 Score 0 0 0 0 0 0 0  PHQ- 9 Score     0 0     Fall Risk    01/19/2023   11:47 AM 09/18/2022    8:20 AM 01/20/2022    9:12 AM 08/16/2021    8:02 AM 09/25/2020   12:43 PM  Fall Risk   Falls in the past year? 0 0 0 0 0  Number falls in past yr: 0 0 0 0 0  Injury with Fall? 0 0 0 0 0  Risk for fall due to : Impaired vision No Fall Risks No Fall Risks  No Fall Risks  Follow up Falls prevention discussed Falls evaluation completed Falls evaluation completed  Falls  evaluation completed    MEDICARE RISK AT HOME:  Medicare Risk at Home - 01/19/23 1147     Any stairs in or around the home? Yes    If so, are there any without handrails? No    Home free of loose throw rugs in walkways, pet beds, electrical cords, etc? Yes    Adequate lighting in your home to reduce risk of falls? Yes    Life alert? No    Use of a cane, walker or w/c? No    Grab bars in the bathroom? No    Shower chair or bench in shower? No    Elevated toilet seat or a handicapped toilet? No             TIMED UP AND GO:  Was  the test performed?  No    Cognitive Function:    01/30/2015    9:41 AM  MMSE - Mini Mental State Exam  Not completed: Unable to complete        01/19/2023   11:48 AM 01/20/2022    9:12 AM  6CIT Screen  What Year? 0 points 0 points  What month? 0 points 0 points  What time? 0 points 0 points  Count back from 20 0 points 0 points  Months in reverse 0 points 0 points  Repeat phrase 0 points 0 points  Total Score 0 points 0 points    Immunizations Immunization History  Administered Date(s) Administered   Fluad Quad(high Dose 65+) 03/23/2020   Influenza, High Dose Seasonal PF 04/16/2015, 02/27/2016, 02/27/2016, 03/30/2017, 03/20/2019, 03/23/2021, 03/14/2022   Influenza,inj,Quad PF,6+ Mos 03/16/2014, 04/05/2018   PFIZER(Purple Top)SARS-COV-2 Vaccination 08/19/2019, 09/13/2019, 04/21/2020   Pneumococcal Conjugate-13 10/21/2017   Pneumococcal Polysaccharide-23 09/06/2012   Td 02/12/2015   Zoster Recombinant(Shingrix) 11/11/2021, 01/20/2022    TDAP status: Up to date  Flu Vaccine status: Up to date  Pneumococcal vaccine status: Due, Education has been provided regarding the importance of this vaccine. Advised may receive this vaccine at local pharmacy or Health Dept. Aware to provide a copy of the vaccination record if obtained from local pharmacy or Health Dept. Verbalized acceptance and understanding.  Covid-19 vaccine status: Completed vaccines  Qualifies for Shingles Vaccine? Yes   Zostavax completed Yes   Shingrix Completed?: Yes  Screening Tests Health Maintenance  Topic Date Due   Hepatitis C Screening  Never done   Pneumonia Vaccine 46+ Years old (3 of 3 - PPSV23 or PCV20) 12/16/2017   COVID-19 Vaccine (4 - 2023-24 season) 02/21/2022   INFLUENZA VACCINE  01/22/2023   MAMMOGRAM  04/27/2023   Medicare Annual Wellness (AWV)  01/19/2024   DTaP/Tdap/Td (2 - Tdap) 02/11/2025   DEXA SCAN  Completed   Zoster Vaccines- Shingrix  Completed   HPV VACCINES  Aged Out    Colonoscopy  Discontinued    Health Maintenance  Health Maintenance Due  Topic Date Due   Hepatitis C Screening  Never done   Pneumonia Vaccine 48+ Years old (3 of 3 - PPSV23 or PCV20) 12/16/2017   COVID-19 Vaccine (4 - 2023-24 season) 02/21/2022    Colorectal cancer screening: No longer required.   Mammogram status: Completed 04/26/22. Repeat every year  Bone Density status: Completed 04/24/20. Results reflect: Bone density results: NORMAL. Repeat every 2 years.  Additional Screening:  Hepatitis C Screening: does qualify  Vision Screening: Recommended annual ophthalmology exams for early detection of glaucoma and other disorders of the eye. Is the patient up to date with their annual  eye exam?  Yes  Who is the provider or what is the name of the office in which the patient attends annual eye exams? Dr Dione Booze  If pt is not established with a provider, would they like to be referred to a provider to establish care? No .   Dental Screening: Recommended annual dental exams for proper oral hygiene  Diabetic Foot Exam:   Community Resource Referral / Chronic Care Management: CRR required this visit?  No   CCM required this visit?  No     Plan:     I have personally reviewed and noted the following in the patient's chart:   Medical and social history Use of alcohol, tobacco or illicit drugs  Current medications and supplements including opioid prescriptions. Patient is not currently taking opioid prescriptions. Functional ability and status Nutritional status Physical activity Advanced directives List of other physicians Hospitalizations, surgeries, and ER visits in previous 12 months Vitals Screenings to include cognitive, depression, and falls Referrals and appointments  In addition, I have reviewed and discussed with patient certain preventive protocols, quality metrics, and best practice recommendations. A written personalized care plan for preventive services as  well as general preventive health recommendations were provided to patient.     Marzella Schlein, LPN   09/19/5186   After Visit Summary: (Declined) Due to this being a telephonic visit, with patients personalized plan was offered to patient but patient Declined AVS at this time   Nurse Notes: Pt hep c due per health maintenance.   Medical screening examination/treatment/procedure(s) were performed by non-physician practitioner and as supervising physician I was immediately available for consultation/collaboration.  I agree with above. Jacinta Shoe, MD

## 2023-01-19 NOTE — Patient Instructions (Signed)
[name] , Thank you for taking time to come for your Medicare Wellness Visit. I appreciate your ongoing commitment to your health goals. Please review the following plan we discussed and let me know if I can assist you in the future.   Referrals/Orders/Follow-Ups/Clinician Recommendations: lose weight and better diet   This is a list of the screening recommended for you and due dates:  Health Maintenance  Topic Date Due   Hepatitis C Screening  Never done   Pneumonia Vaccine (3 of 3 - PPSV23 or PCV20) 12/16/2017   Zoster (Shingles) Vaccine (2 of 2) 01/06/2022   COVID-19 Vaccine (4 - 2023-24 season) 02/21/2022   Flu Shot  01/22/2023   Mammogram  04/27/2023   Medicare Annual Wellness Visit  01/19/2024   DTaP/Tdap/Td vaccine (2 - Tdap) 02/11/2025   DEXA scan (bone density measurement)  Completed   HPV Vaccine  Aged Out   Colon Cancer Screening  Discontinued    Advanced directives: (Copy Requested) Please bring a copy of your health care power of attorney and living will to the office to be added to your chart at your convenience.  Next Medicare Annual Wellness Visit scheduled for next year: yes   Preventive Care 61 Years and Older, Female Preventive care refers to lifestyle choices and visits with your health care provider that can promote health and wellness. What does preventive care include? A yearly physical exam. This is also called an annual well check. Dental exams once or twice a year. Routine eye exams. Ask your health care provider how often you should have your eyes checked. Personal lifestyle choices, including: Daily care of your teeth and gums. Regular physical activity. Eating a healthy diet. Avoiding tobacco and drug use. Limiting alcohol use. Practicing safe sex. Taking low-dose aspirin every day. Taking vitamin and mineral supplements as recommended by your health care provider. What happens during an annual well check? The services and screenings done by your  health care provider during your annual well check will depend on your age, overall health, lifestyle risk factors, and family history of disease. Counseling  Your health care provider may ask you questions about your: Alcohol use. Tobacco use. Drug use. Emotional well-being. Home and relationship well-being. Sexual activity. Eating habits. History of falls. Memory and ability to understand (cognition). Work and work Astronomer. Reproductive health. Screening  You may have the following tests or measurements: Height, weight, and BMI. Blood pressure. Lipid and cholesterol levels. These may be checked every 5 years, or more frequently if you are over 64 years old. Skin check. Lung cancer screening. You may have this screening every year starting at age 65 if you have a 30-pack-year history of smoking and currently smoke or have quit within the past 15 years. Fecal occult blood test (FOBT) of the stool. You may have this test every year starting at age 67. Flexible sigmoidoscopy or colonoscopy. You may have a sigmoidoscopy every 5 years or a colonoscopy every 10 years starting at age 31. Hepatitis C blood test. Hepatitis B blood test. Sexually transmitted disease (STD) testing. Diabetes screening. This is done by checking your blood sugar (glucose) after you have not eaten for a while (fasting). You may have this done every 1-3 years. Bone density scan. This is done to screen for osteoporosis. You may have this done starting at age 89. Mammogram. This may be done every 1-2 years. Talk to your health care provider about how often you should have regular mammograms. Talk with your health  care provider about your test results, treatment options, and if necessary, the need for more tests. Vaccines  Your health care provider may recommend certain vaccines, such as: Influenza vaccine. This is recommended every year. Tetanus, diphtheria, and acellular pertussis (Tdap, Td) vaccine. You may  need a Td booster every 10 years. Zoster vaccine. You may need this after age 68. Pneumococcal 13-valent conjugate (PCV13) vaccine. One dose is recommended after age 44. Pneumococcal polysaccharide (PPSV23) vaccine. One dose is recommended after age 40. Talk to your health care provider about which screenings and vaccines you need and how often you need them. This information is not intended to replace advice given to you by your health care provider. Make sure you discuss any questions you have with your health care provider. Document Released: 07/06/2015 Document Revised: 02/27/2016 Document Reviewed: 04/10/2015 Elsevier Interactive Patient Education  2017 [employer].  Fall Prevention in the Home Falls can cause injuries. They can happen to people of all ages. There are many things you can do to make your home safe and to help prevent falls. What can I do on the outside of my home? Regularly fix the edges of walkways and driveways and fix any cracks. Remove anything that might make you trip as you walk through a door, such as a raised step or threshold. Trim any bushes or trees on the path to your home. Use bright outdoor lighting. Clear any walking paths of anything that might make someone trip, such as rocks or tools. Regularly check to see if handrails are loose or broken. Make sure that both sides of any steps have handrails. Any raised decks and porches should have guardrails on the edges. Have any leaves, snow, or ice cleared regularly. Use sand or salt on walking paths during winter. Clean up any spills in your garage right away. This includes oil or grease spills. What can I do in the bathroom? Use night lights. Install grab bars by the toilet and in the tub and shower. Do not use towel bars as grab bars. Use non-skid mats or decals in the tub or shower. If you need to sit down in the shower, use a plastic, non-slip stool. Keep the floor dry. Clean up any water that spills on  the floor as soon as it happens. Remove soap buildup in the tub or shower regularly. Attach bath mats securely with double-sided non-slip rug tape. Do not have throw rugs and other things on the floor that can make you trip. What can I do in the bedroom? Use night lights. Make sure that you have a light by your bed that is easy to reach. Do not use any sheets or blankets that are too big for your bed. They should not hang down onto the floor. Have a firm chair that has side arms. You can use this for support while you get dressed. Do not have throw rugs and other things on the floor that can make you trip. What can I do in the kitchen? Clean up any spills right away. Avoid walking on wet floors. Keep items that you use a lot in easy-to-reach places. If you need to reach something above you, use a strong step stool that has a grab bar. Keep electrical cords out of the way. Do not use floor polish or wax that makes floors slippery. If you must use wax, use non-skid floor wax. Do not have throw rugs and other things on the floor that can make you trip. What can  I do with my stairs? Do not leave any items on the stairs. Make sure that there are handrails on both sides of the stairs and use them. Fix handrails that are broken or loose. Make sure that handrails are as long as the stairways. Check any carpeting to make sure that it is firmly attached to the stairs. Fix any carpet that is loose or worn. Avoid having throw rugs at the top or bottom of the stairs. If you do have throw rugs, attach them to the floor with carpet tape. Make sure that you have a light switch at the top of the stairs and the bottom of the stairs. If you do not have them, ask someone to add them for you. What else can I do to help prevent falls? Wear shoes that: Do not have high heels. Have rubber bottoms. Are comfortable and fit you well. Are closed at the toe. Do not wear sandals. If you use a stepladder: Make sure  that it is fully opened. Do not climb a closed stepladder. Make sure that both sides of the stepladder are locked into place. Ask someone to hold it for you, if possible. Clearly mark and make sure that you can see: Any grab bars or handrails. First and last steps. Where the edge of each step is. Use tools that help you move around (mobility aids) if they are needed. These include: Canes. Walkers. Scooters. Crutches. Turn on the lights when you go into a dark area. Replace any light bulbs as soon as they burn out. Set up your furniture so you have a clear path. Avoid moving your furniture around. If any of your floors are uneven, fix them. If there are any pets around you, be aware of where they are. Review your medicines with your doctor. Some medicines can make you feel dizzy. This can increase your chance of falling. Ask your doctor what other things that you can do to help prevent falls. This information is not intended to replace advice given to you by your health care provider. Make sure you discuss any questions you have with your health care provider. Document Released: 04/05/2009 Document Revised: 11/15/2015 Document Reviewed: 07/14/2014 Elsevier Interactive Patient Education  2017 [employer].

## 2023-02-25 ENCOUNTER — Encounter: Payer: Self-pay | Admitting: Oncology

## 2023-03-03 ENCOUNTER — Encounter: Payer: Self-pay | Admitting: Podiatry

## 2023-03-03 ENCOUNTER — Ambulatory Visit: Payer: Medicare PPO | Admitting: Podiatry

## 2023-03-03 DIAGNOSIS — D2372 Other benign neoplasm of skin of left lower limb, including hip: Secondary | ICD-10-CM | POA: Diagnosis not present

## 2023-03-03 DIAGNOSIS — M7752 Other enthesopathy of left foot: Secondary | ICD-10-CM

## 2023-03-03 MED ORDER — DEXAMETHASONE SODIUM PHOSPHATE 120 MG/30ML IJ SOLN
2.0000 mg | Freq: Once | INTRAMUSCULAR | Status: AC
Start: 2023-03-03 — End: 2023-03-03
  Administered 2023-03-03: 2 mg via INTRA_ARTICULAR

## 2023-03-03 NOTE — Progress Notes (Signed)
She presents today chief complaint of painful area beneath the fifth metatarsal head of the left foot.  Objective: Vital signs stable alert oriented x 3 there is no erythema Dem salines drainage odor porokeratotic lesion is present.  There is underlying area of fluctuance and consistent with a bursitis.  There is mild erythema surrounding it.  Assessment: Bursitis plantarflexed fifth metatarsal resulting in benign skin lesion plantar lateral aspect of the head of the metatarsal.  Plan: Discussed etiology pathology and surgical therapies at this point injected the bursa today with dexamethasone local anesthetic a total of 2 mg was utilized with local anesthetic.  I was able to enucleate the lesion today and I will follow-up with her on an as-needed basis

## 2023-03-19 ENCOUNTER — Encounter: Payer: Self-pay | Admitting: Internal Medicine

## 2023-03-19 ENCOUNTER — Ambulatory Visit: Payer: Medicare PPO | Admitting: Internal Medicine

## 2023-03-19 VITALS — BP 122/70 | HR 64 | Temp 98.1°F | Ht 65.0 in | Wt 205.0 lb

## 2023-03-19 DIAGNOSIS — J069 Acute upper respiratory infection, unspecified: Secondary | ICD-10-CM | POA: Diagnosis not present

## 2023-03-19 DIAGNOSIS — E538 Deficiency of other specified B group vitamins: Secondary | ICD-10-CM

## 2023-03-19 DIAGNOSIS — I10 Essential (primary) hypertension: Secondary | ICD-10-CM

## 2023-03-19 DIAGNOSIS — N1832 Chronic kidney disease, stage 3b: Secondary | ICD-10-CM

## 2023-03-19 DIAGNOSIS — R7309 Other abnormal glucose: Secondary | ICD-10-CM

## 2023-03-19 DIAGNOSIS — E039 Hypothyroidism, unspecified: Secondary | ICD-10-CM | POA: Diagnosis not present

## 2023-03-19 DIAGNOSIS — D509 Iron deficiency anemia, unspecified: Secondary | ICD-10-CM

## 2023-03-19 LAB — COMPREHENSIVE METABOLIC PANEL
ALT: 9 U/L (ref 0–35)
AST: 16 U/L (ref 0–37)
Albumin: 4.1 g/dL (ref 3.5–5.2)
Alkaline Phosphatase: 69 U/L (ref 39–117)
BUN: 26 mg/dL — ABNORMAL HIGH (ref 6–23)
CO2: 27 mEq/L (ref 19–32)
Calcium: 9.8 mg/dL (ref 8.4–10.5)
Chloride: 106 mEq/L (ref 96–112)
Creatinine, Ser: 1.96 mg/dL — ABNORMAL HIGH (ref 0.40–1.20)
GFR: 24.66 mL/min — ABNORMAL LOW (ref >60.00)
Glucose, Bld: 81 mg/dL (ref 70–99)
Potassium: 4.6 mEq/L (ref 3.5–5.1)
Sodium: 141 mEq/L (ref 135–145)
Total Bilirubin: 0.6 mg/dL (ref 0.2–1.2)
Total Protein: 7 g/dL (ref 6.0–8.3)

## 2023-03-19 LAB — CBC WITH DIFFERENTIAL/PLATELET
Basophils Absolute: 0 10*3/uL (ref 0.0–0.1)
Basophils Relative: 0.5 % (ref 0.0–3.0)
Eosinophils Absolute: 0.1 10*3/uL (ref 0.0–0.7)
Eosinophils Relative: 1.3 % (ref 0.0–5.0)
HCT: 37.9 % (ref 36.0–46.0)
Hemoglobin: 12.6 g/dL (ref 12.0–15.0)
Lymphocytes Relative: 48.7 % — ABNORMAL HIGH (ref 12.0–46.0)
Lymphs Abs: 1.9 10*3/uL (ref 0.7–4.0)
MCHC: 33.2 g/dL (ref 30.0–36.0)
MCV: 85.2 fl (ref 78.0–100.0)
Monocytes Absolute: 0.4 10*3/uL (ref 0.1–1.0)
Monocytes Relative: 11.3 % (ref 3.0–12.0)
Neutro Abs: 1.5 10*3/uL (ref 1.4–7.7)
Neutrophils Relative %: 38.2 % — ABNORMAL LOW (ref 43.0–77.0)
Platelets: 249 10*3/uL (ref 150.0–400.0)
RBC: 4.45 Mil/uL (ref 3.87–5.11)
RDW: 14.6 % (ref 11.5–15.5)
WBC: 4 10*3/uL (ref 4.0–10.5)

## 2023-03-19 LAB — POC COVID19 BINAXNOW: SARS Coronavirus 2 Ag: NEGATIVE

## 2023-03-19 LAB — HEMOGLOBIN A1C: Hgb A1c MFr Bld: 6.1 % (ref 4.6–6.5)

## 2023-03-19 LAB — TSH: TSH: 0.74 u[IU]/mL (ref 0.35–5.50)

## 2023-03-19 LAB — T4, FREE: Free T4: 0.89 ng/dL (ref 0.60–1.60)

## 2023-03-19 NOTE — Assessment & Plan Note (Signed)
Cont on Amlodipine, Losartan

## 2023-03-19 NOTE — Assessment & Plan Note (Signed)
Monitor A1c

## 2023-03-19 NOTE — Progress Notes (Signed)
Pharynx: Posterior oropharyngeal erythema present. No oropharyngeal exudate.  Eyes:     General:        Right eye: No discharge.        Left eye: No discharge.     Conjunctiva/sclera: Conjunctivae normal.     Pupils: Pupils are equal, round, and reactive to light.  Neck:     Thyroid: No thyromegaly.     Vascular: No JVD.     Trachea: No tracheal deviation.  Cardiovascular:     Rate and Rhythm: Normal rate and regular rhythm.     Heart sounds: Normal heart sounds.  Pulmonary:     Effort: No respiratory distress.     Breath sounds: No stridor. No wheezing.  Abdominal:     General: Bowel sounds are normal. There is no distension.     Palpations: Abdomen is soft. There is no mass.     Tenderness: There is no abdominal tenderness. There is no guarding or rebound.  Musculoskeletal:         General: No tenderness.     Cervical back: Normal range of motion and neck supple. No rigidity.  Lymphadenopathy:     Cervical: No cervical adenopathy.  Skin:    Findings: No erythema or rash.  Neurological:     Mental Status: She is oriented to person, place, and time.     Cranial Nerves: No cranial nerve deficit.     Motor: No abnormal muscle tone.     Coordination: Coordination normal.     Deep Tendon Reflexes: Reflexes normal.  Psychiatric:        Behavior: Behavior normal.        Thought Content: Thought content normal.        Judgment: Judgment normal.   Eryth throat  Lab Results  Component Value Date   WBC 5.0 09/18/2022   HGB 13.1 09/18/2022   HCT 38.8 09/18/2022   PLT 288.0 09/18/2022   GLUCOSE 89 09/18/2022   CHOL 197 09/18/2022   TRIG 108.0 09/18/2022   HDL 61.10 09/18/2022   LDLCALC 114 (H) 09/18/2022   ALT 12 09/18/2022   AST 17 09/18/2022   NA 140 09/18/2022   K 4.1 09/18/2022   CL 104 09/18/2022   CREATININE 1.63 (H) 09/18/2022   BUN 20 09/18/2022   CO2 29 09/18/2022   TSH 0.21 (L) 09/18/2022   INR 0.94 12/15/2017   HGBA1C 5.8 (H) 01/16/2020    VAS [place] LOWER EXTREMITY VENOUS (DVT)  Result Date: 11/04/2022  Lower Venous DVT Study Patient Name:  Christine Morales  Date of Exam:   11/04/2022 Medical Rec #: [id number]     Accession #:    [id number] Date of Birth: 01-Aug-1945     Patient Gender: F Patient Age:   77 years Exam Location:  [facility] Procedure:      VAS [place] LOWER EXTREMITY VENOUS (DVT) Referring Phys: [facility] --------------------------------------------------------------------------------  Indications: Right posterior calf pain x 2 days. Patient remembers 1 week ago getting a charlie horse at night. She denies chest pain and SOB.  Comparison Study: None Performing Technologist: Alecia Mackin RVT, RDCS (AE), RDMS  Examination Guidelines: A complete evaluation includes B-mode imaging, spectral Doppler, color Doppler, and power Doppler as needed  of all accessible portions of each vessel. Bilateral testing is considered an integral part of a complete examination. Limited examinations for reoccurring indications may be performed as noted. The reflux portion of the exam is performed with the patient in reverse Trendelenburg.  +---------+---------------+---------+-----------+----------+--------------+ RIGHT  Pharynx: Posterior oropharyngeal erythema present. No oropharyngeal exudate.  Eyes:     General:        Right eye: No discharge.        Left eye: No discharge.     Conjunctiva/sclera: Conjunctivae normal.     Pupils: Pupils are equal, round, and reactive to light.  Neck:     Thyroid: No thyromegaly.     Vascular: No JVD.     Trachea: No tracheal deviation.  Cardiovascular:     Rate and Rhythm: Normal rate and regular rhythm.     Heart sounds: Normal heart sounds.  Pulmonary:     Effort: No respiratory distress.     Breath sounds: No stridor. No wheezing.  Abdominal:     General: Bowel sounds are normal. There is no distension.     Palpations: Abdomen is soft. There is no mass.     Tenderness: There is no abdominal tenderness. There is no guarding or rebound.  Musculoskeletal:         General: No tenderness.     Cervical back: Normal range of motion and neck supple. No rigidity.  Lymphadenopathy:     Cervical: No cervical adenopathy.  Skin:    Findings: No erythema or rash.  Neurological:     Mental Status: She is oriented to person, place, and time.     Cranial Nerves: No cranial nerve deficit.     Motor: No abnormal muscle tone.     Coordination: Coordination normal.     Deep Tendon Reflexes: Reflexes normal.  Psychiatric:        Behavior: Behavior normal.        Thought Content: Thought content normal.        Judgment: Judgment normal.   Eryth throat  Lab Results  Component Value Date   WBC 5.0 09/18/2022   HGB 13.1 09/18/2022   HCT 38.8 09/18/2022   PLT 288.0 09/18/2022   GLUCOSE 89 09/18/2022   CHOL 197 09/18/2022   TRIG 108.0 09/18/2022   HDL 61.10 09/18/2022   LDLCALC 114 (H) 09/18/2022   ALT 12 09/18/2022   AST 17 09/18/2022   NA 140 09/18/2022   K 4.1 09/18/2022   CL 104 09/18/2022   CREATININE 1.63 (H) 09/18/2022   BUN 20 09/18/2022   CO2 29 09/18/2022   TSH 0.21 (L) 09/18/2022   INR 0.94 12/15/2017   HGBA1C 5.8 (H) 01/16/2020    VAS [place] LOWER EXTREMITY VENOUS (DVT)  Result Date: 11/04/2022  Lower Venous DVT Study Patient Name:  Christine Morales  Date of Exam:   11/04/2022 Medical Rec #: [id number]     Accession #:    [id number] Date of Birth: 01-Aug-1945     Patient Gender: F Patient Age:   77 years Exam Location:  [facility] Procedure:      VAS [place] LOWER EXTREMITY VENOUS (DVT) Referring Phys: [facility] --------------------------------------------------------------------------------  Indications: Right posterior calf pain x 2 days. Patient remembers 1 week ago getting a charlie horse at night. She denies chest pain and SOB.  Comparison Study: None Performing Technologist: Alecia Mackin RVT, RDCS (AE), RDMS  Examination Guidelines: A complete evaluation includes B-mode imaging, spectral Doppler, color Doppler, and power Doppler as needed  of all accessible portions of each vessel. Bilateral testing is considered an integral part of a complete examination. Limited examinations for reoccurring indications may be performed as noted. The reflux portion of the exam is performed with the patient in reverse Trendelenburg.  +---------+---------------+---------+-----------+----------+--------------+ RIGHT  Subjective:  Patient ID: Christine Morales, female    DOB: Dec 29, 1945  Age: 77 y.o. MRN: [id number]  CC: No chief complaint on file.   HPI Christine Morales presents for URI since Sunday - better now F/u on HTN  Outpatient Medications Prior to Visit  Medication Sig Dispense Refill   acetaminophen (TYLENOL) 500 MG tablet Take 1,000 mg by mouth 2 (two) times daily as needed for headache.     amLODipine (NORVASC) 5 MG tablet Take 1 tablet (5 mg total) by mouth daily. 90 tablet 3   Aspirin Effervescent (ALKA-SELTZER ORIGINAL PO) Take 1 tablet by mouth daily as needed (body aches).     Cholecalciferol (VITAMIN D) 50 MCG (2000 UT) tablet Take 1 tablet (2,000 Units total) by mouth daily. 90 tablet 3   cyanocobalamin (VITAMIN B12) 1000 MCG tablet Take 1 tablet (1,000 mcg total) by mouth daily. 90 tablet 1   ferrous sulfate 325 (65 FE) MG tablet Take 1 tablet (325 mg total) by mouth daily. (Patient not taking: Reported on 01/19/2023) 90 tablet 1   levothyroxine (SYNTHROID) 100 MCG tablet TAKE 1 TABLET BY MOUTH EVERY DAY 90 tablet 3   losartan (COZAAR) 100 MG tablet Take 1 tablet (100 mg total) by mouth daily. 90 tablet 3   Multiple Vitamin (MULTIVITAMIN WITH MINERALS) TABS tablet Take 1 tablet by mouth daily. Geritol     Facility-Administered Medications Prior to Visit  Medication Dose Route Frequency Provider Last Rate Last Admin   heparin lock flush 100 unit/mL  500 Units Intravenous Once Shadad, Blenda Nicely, MD       sodium chloride 0.9 % injection 10 mL  10 mL Intravenous PRN Benjiman Core, MD       sodium chloride 0.9 % injection 10 mL  10 mL Intravenous PRN Benjiman Core, MD   10 mL at 05/20/17 0956    ROS: Review of Systems  Constitutional:  Positive for chills. Negative for activity change, appetite change, fatigue and unexpected weight change.  HENT:  Positive for rhinorrhea and sore throat. Negative for congestion, mouth sores and sinus pressure.   Eyes:  Negative for visual disturbance.   Respiratory:  Negative for cough and chest tightness.   Gastrointestinal:  Negative for abdominal pain and nausea.  Genitourinary:  Negative for difficulty urinating, frequency and vaginal pain.  Musculoskeletal:  Negative for back pain and gait problem.  Skin:  Negative for pallor and rash.  Neurological:  Negative for dizziness, tremors, weakness, numbness and headaches.  Psychiatric/Behavioral:  Negative for confusion and sleep disturbance.     Objective:  BP 122/70   Pulse 64   Temp 98.1 F (36.7 C) (Oral)   Ht 5\' 5"  (1.651 m)   Wt 205 lb (93 kg)   SpO2 95%   BMI 34.11 kg/m   BP Readings from Last 3 Encounters:  03/19/23 122/70  12/16/22 118/70  09/18/22 118/70    Wt Readings from Last 3 Encounters:  03/19/23 205 lb (93 kg)  01/19/23 209 lb (94.8 kg)  12/16/22 211 lb (95.7 kg)    Physical Exam Constitutional:      General: She is not in acute distress.    Appearance: She is well-developed. She is obese. She is not toxic-appearing.  HENT:     Head: Normocephalic.     Right Ear: External ear normal.     Left Ear: External ear normal.     Nose: Nose normal.     Mouth/Throat:  Pharynx: Posterior oropharyngeal erythema present. No oropharyngeal exudate.  Eyes:     General:        Right eye: No discharge.        Left eye: No discharge.     Conjunctiva/sclera: Conjunctivae normal.     Pupils: Pupils are equal, round, and reactive to light.  Neck:     Thyroid: No thyromegaly.     Vascular: No JVD.     Trachea: No tracheal deviation.  Cardiovascular:     Rate and Rhythm: Normal rate and regular rhythm.     Heart sounds: Normal heart sounds.  Pulmonary:     Effort: No respiratory distress.     Breath sounds: No stridor. No wheezing.  Abdominal:     General: Bowel sounds are normal. There is no distension.     Palpations: Abdomen is soft. There is no mass.     Tenderness: There is no abdominal tenderness. There is no guarding or rebound.  Musculoskeletal:         General: No tenderness.     Cervical back: Normal range of motion and neck supple. No rigidity.  Lymphadenopathy:     Cervical: No cervical adenopathy.  Skin:    Findings: No erythema or rash.  Neurological:     Mental Status: She is oriented to person, place, and time.     Cranial Nerves: No cranial nerve deficit.     Motor: No abnormal muscle tone.     Coordination: Coordination normal.     Deep Tendon Reflexes: Reflexes normal.  Psychiatric:        Behavior: Behavior normal.        Thought Content: Thought content normal.        Judgment: Judgment normal.   Eryth throat  Lab Results  Component Value Date   WBC 5.0 09/18/2022   HGB 13.1 09/18/2022   HCT 38.8 09/18/2022   PLT 288.0 09/18/2022   GLUCOSE 89 09/18/2022   CHOL 197 09/18/2022   TRIG 108.0 09/18/2022   HDL 61.10 09/18/2022   LDLCALC 114 (H) 09/18/2022   ALT 12 09/18/2022   AST 17 09/18/2022   NA 140 09/18/2022   K 4.1 09/18/2022   CL 104 09/18/2022   CREATININE 1.63 (H) 09/18/2022   BUN 20 09/18/2022   CO2 29 09/18/2022   TSH 0.21 (L) 09/18/2022   INR 0.94 12/15/2017   HGBA1C 5.8 (H) 01/16/2020    VAS [place] LOWER EXTREMITY VENOUS (DVT)  Result Date: 11/04/2022  Lower Venous DVT Study Patient Name:  Christine Morales  Date of Exam:   11/04/2022 Medical Rec #: [id number]     Accession #:    [id number] Date of Birth: 01-Aug-1945     Patient Gender: F Patient Age:   77 years Exam Location:  [facility] Procedure:      VAS [place] LOWER EXTREMITY VENOUS (DVT) Referring Phys: [facility] --------------------------------------------------------------------------------  Indications: Right posterior calf pain x 2 days. Patient remembers 1 week ago getting a charlie horse at night. She denies chest pain and SOB.  Comparison Study: None Performing Technologist: Alecia Mackin RVT, RDCS (AE), RDMS  Examination Guidelines: A complete evaluation includes B-mode imaging, spectral Doppler, color Doppler, and power Doppler as needed  of all accessible portions of each vessel. Bilateral testing is considered an integral part of a complete examination. Limited examinations for reoccurring indications may be performed as noted. The reflux portion of the exam is performed with the patient in reverse Trendelenburg.  +---------+---------------+---------+-----------+----------+--------------+ RIGHT

## 2023-03-19 NOTE — Assessment & Plan Note (Signed)
St 3b GFR 31 Monitoring GFR Hydrate well

## 2023-03-19 NOTE — Assessment & Plan Note (Signed)
Check CBC

## 2023-03-19 NOTE — Addendum Note (Signed)
Addended by: Delsa Grana R on: 03/19/2023 09:19 AM   Modules accepted: Orders

## 2023-03-19 NOTE — Assessment & Plan Note (Signed)
COVID test (-) OTC meds

## 2023-03-19 NOTE — Assessment & Plan Note (Signed)
On B12 

## 2023-03-26 ENCOUNTER — Telehealth: Payer: Self-pay | Admitting: Internal Medicine

## 2023-03-26 NOTE — Telephone Encounter (Signed)
Patient returned Jaz's call about her lab results and would like a call back at [phone].

## 2023-03-27 ENCOUNTER — Ambulatory Visit (HOSPITAL_COMMUNITY)
Admission: EM | Admit: 2023-03-27 | Discharge: 2023-03-27 | Disposition: A | Discharge status: Home / Self Care | Payer: Medicare PPO | Attending: Physician Assistant | Admitting: Physician Assistant

## 2023-03-27 ENCOUNTER — Encounter (HOSPITAL_COMMUNITY): Payer: Self-pay

## 2023-03-27 DIAGNOSIS — L500 Allergic urticaria: Secondary | ICD-10-CM | POA: Diagnosis not present

## 2023-03-27 DIAGNOSIS — T63481A Toxic effect of venom of other arthropod, accidental (unintentional), initial encounter: Secondary | ICD-10-CM

## 2023-03-27 MED ORDER — TRIAMCINOLONE ACETONIDE 0.1 % EX CREA: 1.0000 | TOPICAL_CREAM | Freq: Two times a day (BID) | CUTANEOUS | 0 refills | Status: AC

## 2023-03-27 MED ORDER — FAMOTIDINE 10 MG PO TABS: 10.0000 mg | ORAL_TABLET | Freq: Every day | ORAL | 0 refills | Status: AC

## 2023-03-27 MED ORDER — CETIRIZINE HCL 5 MG PO TABS: 5.0000 mg | ORAL_TABLET | Freq: Every day | ORAL | 0 refills | Status: AC

## 2023-03-27 NOTE — ED Provider Notes (Signed)
[facility]    CSN: [id number] Arrival date & time: 03/27/23  1040      History   Chief Complaint Chief Complaint  Patient presents with   Insect Bite    Bee sting     HPI Christine Morales is a 77 y.o. female.   Patient presents today for evaluation of a bee sting that occurred approximately 1 to 2 hours ago.  Reports that she was driving in her car when a bee stung her on her right forearm.  She believes this was a [employer], [employer] but is unsure if she did not get the blood.  She initially had a stinging sensation in this area but this is since improved.  She is not experiencing any significant symptoms including widespread rash, swelling of her throat, shortness of breath, GI symptoms, lightheadedness, heart racing.  She has not taken any medication.  Denies history of anaphylaxis or known allergy to insects.  She has never been stung before.  She is unsure when her last tetanus was; last tetanus in EMR is 02/12/2015.    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      Rhinitis, allergic    Thyroid disease    Ulcer    peptic    Patient Active Problem List   Diagnosis Date Noted   URI (upper respiratory infection) 03/19/2023   Strain of calf muscle, sequela 12/16/2022   Laryngitis 03/20/2022   Eczema 10/30/2021   COVID-19 01/25/2021   Cough 01/25/2021   Hand swelling 11/05/2020   Renal cyst 09/18/2020   Fatty liver 09/18/2020   Dyslipidemia 01/17/2020   Elevated glucose 01/16/2020   History of total abdominal hysterectomy 12/21/2019   Arthralgia of foot, left 12/23/2018   Vertigo 10/21/2017   Morbid obesity (HCC) 10/21/2017   Acute bronchitis 07/05/2015   Traumatic hematoma of foot 02/12/2015    Hypothyroidism 09/13/2013   Abdominal pain, left lower quadrant 09/07/2012   Well adult exam 03/08/2012   CRF (chronic renal failure) 09/03/2011   B12 deficiency 11/22/2010   THRUSH 03/14/2010   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   ANEMIA-IRON DEFICIENCY 12/28/2009   Hemorrhoids 12/28/2009   Helicobacter pylori infection 11/23/2009   NAUSEA 11/23/2009   Backache 12/21/2008   OTHER SYMPTOMS INVOLVING DIGESTIVE SYSTEM OTHER 06/19/2008   SWEATING 03/24/2008   Flank pain 03/24/2008   Pain in limb 01/24/2008   Essential hypertension 12/01/2007   Allergic rhinitis 12/01/2007   Blood in stool 12/01/2007   Personal history presenting hazards to health 12/01/2007    Past Surgical History:  Procedure Laterality Date   ABDOMINAL HYSTERECTOMY     BREAST REDUCTION SURGERY     COLON SURGERY     partial colectomy   COLONOSCOPY  07/2006   IR REMOVAL TUN ACCESS W/ PORT W/O FL MOD SED  12/15/2017   PARTIAL COLECTOMY     dr blackmon   TUBAL LIGATION      OB History     Gravida  2   Para  2   Term      Preterm      AB      Living  2  SAB      IAB      Ectopic      Multiple      Live Births               Home Medications    Prior to Admission medications   Medication Sig Start Date End Date Taking? Authorizing Provider  acetaminophen (TYLENOL) 500 MG tablet Take 1,000 mg by mouth 2 (two) times daily as needed for headache.   Yes [provider]  amLODipine (NORVASC) 5 MG tablet Take 1 tablet (5 mg total) by mouth daily. 09/18/22  Yes Plotnikov, Georgina Quint, MD  Aspirin Effervescent (ALKA-SELTZER ORIGINAL PO) Take 1 tablet by mouth daily as needed (body aches).   Yes [provider]  cetirizine (ZYRTEC) 5 MG tablet Take 1 tablet (5 mg total) by mouth daily. 03/27/23  Yes Karlina Suares, Noberto Retort, PA-C  Cholecalciferol (VITAMIN D) 50 MCG (2000 UT) tablet Take 1 tablet (2,000 Units total) by mouth daily. 09/18/22  Yes Plotnikov, Georgina Quint, MD  cyanocobalamin (VITAMIN B12) 1000 MCG tablet Take 1 tablet (1,000 mcg total) by mouth daily. 09/18/22  Yes Plotnikov, Georgina Quint, MD  famotidine (PEPCID) 10 MG tablet Take 1 tablet (10 mg total) by mouth at bedtime. 03/27/23  Yes Alamin Mccuiston K, PA-C  ferrous sulfate 325 (65 FE) MG tablet Take 1 tablet (325 mg total) by mouth daily. 09/18/22  Yes Plotnikov, Georgina Quint, MD  levothyroxine (SYNTHROID) 100 MCG tablet TAKE 1 TABLET BY MOUTH EVERY DAY 09/18/22  Yes Plotnikov, Georgina Quint, MD  losartan (COZAAR) 100 MG tablet Take 1 tablet (100 mg total) by mouth daily. 09/18/22  Yes Plotnikov, Georgina Quint, MD  Multiple Vitamin (MULTIVITAMIN WITH MINERALS) TABS tablet Take 1 tablet by mouth daily. Geritol   Yes [provider]  triamcinolone cream (KENALOG) 0.1 % Apply 1 Application topically 2 (two) times daily. 03/27/23  Yes Merlean Pizzini, Noberto Retort, PA-C    Family History Family History  Problem Relation Age of Onset   Heart disease Mother    Cancer Father        ? type   Heart disease Sister    Diabetes Brother    Hypertension Other        FH   Cholelithiasis Neg Hx    Colon cancer Neg Hx    Colon polyps Neg Hx    Esophageal cancer Neg Hx    Stomach cancer Neg Hx    Rectal cancer Neg Hx     Social History Social History   Tobacco Use   Smoking status: Never   Smokeless tobacco: Never  Vaping Use   Vaping status: Never Used  Substance Use Topics   Alcohol use: No    Alcohol/week: 0.0 standard drinks of alcohol   Drug use: No     Allergies   Patient has no known allergies.   Review of Systems Review of Systems  Constitutional:  Negative for activity change, appetite change, fatigue and fever.  Respiratory:  Negative for cough and shortness of breath.   Cardiovascular:  Negative for chest pain and palpitations.  Gastrointestinal:  Negative for abdominal pain, diarrhea, nausea and vomiting.  Musculoskeletal:  Negative for arthralgias and myalgias.  Skin:  Positive for color  change and wound.     Physical Exam Triage Vital Signs ED Triage Vitals  Encounter Vitals Group     BP 03/27/23 1108 98/64     Systolic BP Percentile --      Diastolic BP  Percentile --      Pulse Rate 03/27/23 1108 65     Resp 03/27/23 1108 18     Temp 03/27/23 1108 (!) 97.5 F (36.4 C)     Temp Source 03/27/23 1108 Oral     SpO2 03/27/23 1108 96 %     Weight 03/27/23 1107 206 lb (93.4 kg)     Height 03/27/23 1107 5\' 5"  (1.651 m)     Head Circumference --      Peak Flow --      Pain Score 03/27/23 1106 0     Pain Loc --      Pain Education --      Exclude from Growth Chart --    No data found.  Updated Vital Signs BP 103/67 (BP Location: Right Arm)   Pulse 65   Temp (!) 97.5 F (36.4 C) (Oral)   Resp 18   Ht 5\' 5"  (1.651 m)   Wt 206 lb (93.4 kg)   SpO2 96%   BMI 34.28 kg/m   Visual Acuity Right Eye Distance:   Left Eye Distance:   Bilateral Distance:    Right Eye Near:   Left Eye Near:    Bilateral Near:     Physical Exam Vitals reviewed.  Constitutional:      General: She is awake. She is not in acute distress.    Appearance: Normal appearance. She is well-developed. She is not ill-appearing.     Comments: Very pleasant female appears stated age in no acute distress sitting comfortably in exam room  HENT:     Head: Normocephalic and atraumatic.     Mouth/Throat:     Pharynx: Uvula midline. No oropharyngeal exudate or posterior oropharyngeal erythema.  Cardiovascular:     Rate and Rhythm: Normal rate and regular rhythm.     Heart sounds: Normal heart sounds, S1 normal and S2 normal. No murmur heard. Pulmonary:     Effort: Pulmonary effort is normal.     Breath sounds: Normal breath sounds. No wheezing, rhonchi or rales.     Comments: Clear to auscultation bilaterally Abdominal:     General: Bowel sounds are normal.     Palpations: Abdomen is soft.     Tenderness: There is no abdominal tenderness. There is no right CVA tenderness, left CVA  tenderness, guarding or rebound.  Skin:    Findings: Rash present. Rash is urticarial.          Comments: 4 cm x 2 and half centimeter urticarial lesion with central puncture on right forearm.  No streaking or evidence of lymphangitis.  No additional rash or lesions noted.  Psychiatric:        Behavior: Behavior is cooperative.      UC Treatments / Results  Labs (all labs ordered are listed, but only abnormal results are displayed) Labs Reviewed - No data to display  EKG   Radiology No results found.  Procedures Procedures (including critical care time)  Medications Ordered in UC Medications - No data to display  Initial Impression / Assessment and Plan / UC Course  I have reviewed the triage vital signs and the nursing notes.  Pertinent labs & imaging results that were available during my care of the patient were reviewed by me and considered in my medical decision making (see chart for details).     Patient is well-appearing, afebrile, nontoxic, nontachycardic.  Vital signs of physical exam are reassuring with no evidence of anaphylaxis or systemic allergic reaction.  She has already had an improvement of symptoms.  Will start H1 and H2 blockade to help manage symptoms as well as topical steroid.  She was encouraged to keep the area clean with soap and water in order to prevent infection.  We discussed potential utility of updating her tetanus but she declined to do this today.  Antihistamines were renally adjusted based on her metabolic panel from 03/19/2023 with creatinine of 1.96 and calculated creatinine clearance of 37 mL/min.  She is to demarcate the area of erythema and if this spread significantly in the next 24 hours or if anything worsens or changes she is to return for reevaluation.  Strict return precautions given.  All questions answered patient satisfaction.  Final Clinical Impressions(s) / UC Diagnoses   Final diagnoses:  Allergic urticaria  Insect stings,  accidental or unintentional, initial encounter     Discharge Instructions      It appears you are having a localized reaction to the sting.  Start cetirizine during the day and famotidine at night.  Apply triamcinolone cream twice daily.  Draw a line around the redness and if this is spreading significantly or if you develop any additional symptoms including swelling of her throat, shortness of breath, muffled voice, nausea, vomiting, widespread rash you need to be seen immediately.  Follow-up with your primary care.     ED Prescriptions     Medication Sig Dispense Auth. Provider   cetirizine (ZYRTEC) 5 MG tablet Take 1 tablet (5 mg total) by mouth daily. 14 tablet Abenezer Odonell K, PA-C   famotidine (PEPCID) 10 MG tablet Take 1 tablet (10 mg total) by mouth at bedtime. 14 tablet Maleiya Pergola K, PA-C   triamcinolone cream (KENALOG) 0.1 % Apply 1 Application topically 2 (two) times daily. 30 g Mykah Shin, Noberto Retort, PA-C      PDMP not reviewed this encounter.   Jeani Hawking, PA-C 03/27/23 1157

## 2023-03-27 NOTE — Discharge Instructions (Signed)
It appears you are having a localized reaction to the sting.  Start cetirizine during the day and famotidine at night.  Apply triamcinolone cream twice daily.  Draw a line around the redness and if this is spreading significantly or if you develop any additional symptoms including swelling of her throat, shortness of breath, muffled voice, nausea, vomiting, widespread rash you need to be seen immediately.  Follow-up with your primary care.

## 2023-03-27 NOTE — ED Triage Notes (Signed)
Patient stung by a bee on the right forearm today while riding in her car. Area is red and swollen. Patient states no known allergy to bees, this is her first time getting stung. Patient has not taken any meds or put anything on it.

## 2023-04-21 ENCOUNTER — Encounter: Payer: Self-pay | Admitting: Oncology

## 2023-04-24 ENCOUNTER — Encounter: Payer: Self-pay | Admitting: Podiatry

## 2023-04-24 ENCOUNTER — Ambulatory Visit: Payer: Medicare PPO | Admitting: Podiatry

## 2023-04-24 VITALS — Ht 65.0 in | Wt 206.0 lb

## 2023-04-24 DIAGNOSIS — M7751 Other enthesopathy of right foot: Secondary | ICD-10-CM

## 2023-04-24 DIAGNOSIS — L84 Corns and callosities: Secondary | ICD-10-CM

## 2023-04-24 MED ORDER — TRIAMCINOLONE ACETONIDE 10 MG/ML IJ SUSP
10.0000 mg | Freq: Once | INTRAMUSCULAR | Status: AC
Start: 2023-04-24 — End: 2023-04-24
  Administered 2023-04-24: 10 mg via INTRA_ARTICULAR

## 2023-04-24 NOTE — Progress Notes (Signed)
Subjective:   Patient ID: Christine Morales, female   DOB: 77 y.o.   MRN: [id number]   HPI Patient presents stating having pain in the second digit right fifth digit states it has been inflamed and states that it has been new and that the area that was worked up by Dr. Al Corpus is doing well   ROS      Objective:  Physical Exam  Neurovascular status intact inflammation of the second digit left fluid buildup around the inner phalangeal joint keratotic lesion painful     Assessment:  Inflammatory capsulitis of the second digit left with pain     Plan:  H&P reviewed sterile prep injected carefully the inner phalangeal joint 2 mg dexamethasone Kenalog debrided cushioned reappoint as symptoms indicate and hopefully this will knock out the discomfort she is experiencing

## 2023-05-08 DIAGNOSIS — Z1231 Encounter for screening mammogram for malignant neoplasm of breast: Secondary | ICD-10-CM | POA: Diagnosis not present

## 2023-05-08 LAB — HM MAMMOGRAPHY

## 2023-05-11 ENCOUNTER — Encounter: Payer: Self-pay | Admitting: Internal Medicine

## 2023-05-19 ENCOUNTER — Ambulatory Visit: Payer: Medicare PPO | Admitting: Nurse Practitioner

## 2023-07-20 ENCOUNTER — Ambulatory Visit: Payer: Medicare PPO | Admitting: Internal Medicine

## 2023-07-20 ENCOUNTER — Encounter: Payer: Self-pay | Admitting: Internal Medicine

## 2023-07-20 ENCOUNTER — Encounter: Payer: Self-pay | Admitting: Oncology

## 2023-07-20 ENCOUNTER — Encounter: Payer: Medicare PPO | Admitting: Nurse Practitioner

## 2023-07-20 VITALS — BP 118/70 | HR 69 | Temp 98.3°F | Ht 65.0 in | Wt 205.0 lb

## 2023-07-20 DIAGNOSIS — R0989 Other specified symptoms and signs involving the circulatory and respiratory systems: Secondary | ICD-10-CM | POA: Insufficient documentation

## 2023-07-20 DIAGNOSIS — E538 Deficiency of other specified B group vitamins: Secondary | ICD-10-CM

## 2023-07-20 DIAGNOSIS — N184 Chronic kidney disease, stage 4 (severe): Secondary | ICD-10-CM

## 2023-07-20 DIAGNOSIS — I129 Hypertensive chronic kidney disease with stage 1 through stage 4 chronic kidney disease, or unspecified chronic kidney disease: Secondary | ICD-10-CM

## 2023-07-20 DIAGNOSIS — C8233 Follicular lymphoma grade IIIa, intra-abdominal lymph nodes: Secondary | ICD-10-CM

## 2023-07-20 DIAGNOSIS — I10 Essential (primary) hypertension: Secondary | ICD-10-CM | POA: Diagnosis not present

## 2023-07-20 LAB — COMPREHENSIVE METABOLIC PANEL
ALT: 11 U/L (ref 0–35)
AST: 18 U/L (ref 0–37)
Albumin: 4.3 g/dL (ref 3.5–5.2)
Alkaline Phosphatase: 78 U/L (ref 39–117)
BUN: 23 mg/dL (ref 6–23)
CO2: 28 meq/L (ref 19–32)
Calcium: 10 mg/dL (ref 8.4–10.5)
Chloride: 104 meq/L (ref 96–112)
Creatinine, Ser: 1.75 mg/dL — ABNORMAL HIGH (ref 0.40–1.20)
GFR: 28.19 mL/min — ABNORMAL LOW (ref >60.00)
Glucose, Bld: 87 mg/dL (ref 70–99)
Potassium: 4.4 meq/L (ref 3.5–5.1)
Sodium: 141 meq/L (ref 135–145)
Total Bilirubin: 0.7 mg/dL (ref 0.2–1.2)
Total Protein: 6.9 g/dL (ref 6.0–8.3)

## 2023-07-20 LAB — CBC WITH DIFFERENTIAL/PLATELET
Basophils Absolute: 0 10*3/uL (ref 0.0–0.1)
Basophils Relative: 0.6 % (ref 0.0–3.0)
Eosinophils Absolute: 0.1 10*3/uL (ref 0.0–0.7)
Eosinophils Relative: 2.5 % (ref 0.0–5.0)
HCT: 39 % (ref 36.0–46.0)
Hemoglobin: 13 g/dL (ref 12.0–15.0)
Lymphocytes Relative: 44.4 % (ref 12.0–46.0)
Lymphs Abs: 2 10*3/uL (ref 0.7–4.0)
MCHC: 33.3 g/dL (ref 30.0–36.0)
MCV: 86.8 fL (ref 78.0–100.0)
Monocytes Absolute: 0.4 10*3/uL (ref 0.1–1.0)
Monocytes Relative: 7.9 % (ref 3.0–12.0)
Neutro Abs: 2 10*3/uL (ref 1.4–7.7)
Neutrophils Relative %: 44.6 % (ref 43.0–77.0)
Platelets: 265 10*3/uL (ref 150.0–400.0)
RBC: 4.5 Mil/uL (ref 3.87–5.11)
RDW: 14.4 % (ref 11.5–15.5)
WBC: 4.4 10*3/uL (ref 4.0–10.5)

## 2023-07-20 LAB — T4, FREE: Free T4: 0.81 ng/dL (ref 0.60–1.60)

## 2023-07-20 LAB — HEMOGLOBIN A1C: Hgb A1c MFr Bld: 6.1 % (ref 4.6–6.5)

## 2023-07-20 NOTE — Assessment & Plan Note (Signed)
Remote No sx's at present

## 2023-07-20 NOTE — Progress Notes (Deleted)
 Christine Morales [birth year] [id number]   History:  78 y.o. G2 P2 presents for breast and pelvic exam without GYN complaints. Postmenopausal - no HRT. S/P 2008 TAH for fibroids. Normal pap and mammogram history. Primary care manages hypothyroidism, HTN. 2011 diagnosed with non-hodgkin's lymphoma.   Gynecologic History No LMP recorded. Patient has had a hysterectomy.   Contraception: status post hysterectomy  Health maintenance Last Pap: 01/06/2014. Results were: Normal  Last mammogram: 05/08/2023. Results were: Normal Last colonoscopy: 04/11/2020. Results were: Tubular adenoma, no repeat recommended Last Dexa: 04/24/2020. Results were: Normal  Past medical history, past surgical history, family history and social history were all reviewed and documented in the EPIC chart. Widowed. Son in [place], daughter in [place]. 79 yo grandson lives with patient. 6 grandchildren. 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   ROS:  A ROS was performed and pertinent positives and negatives are included.  Exam:  There were no vitals filed for this visit.   There is no height or weight on file to calculate BMI.  General appearance:  Normal Thyroid:  Symmetrical, normal in size, without palpable masses or nodularity. Respiratory  Auscultation:  Clear without wheezing or rhonchi Cardiovascular  Auscultation:  Regular rate, without rubs, murmurs or gallops  Edema/varicosities:  Not grossly evident Abdominal  Soft,nontender, without masses, guarding or rebound.  Liver/spleen:  No organomegaly noted  Hernia:  None appreciated  Skin  Inspection:  Grossly normal   Breasts: Examined lying and sitting. Bilateral breast reduction scars  Right: Without masses, retractions, discharge or axillary adenopathy.   Left: Without masses, retractions, discharge or axillary adenopathy. Pelvic: External genitalia:  no lesions              Urethra:  normal appearing urethra with no masses, tenderness or  lesions              Bartholins and Skenes: normal                 Vagina: normal appearing vagina with normal color and discharge, no lesions              Cervix: absent Bimanual Exam:  Uterus:  absent              Adnexa: no mass, fullness, tenderness              Rectovaginal: Deferred              Anus:  normal, no lesions  Patient informed chaperone available to be present for breast and pelvic exam. Patient has requested no chaperone to be present. Patient has been advised what will be completed during breast and pelvic exam.   Assessment/Plan:  78 y.o. G2 P2 for breast and pelvic exam.   Well female exam with routine gynecological exam - Education provided on SBEs, importance of preventative screenings, current guidelines, high calcium diet, regular exercise, and multivitamin daily. Labs with PCP.  Postmenopausal - no HRT. S/P 2008 TAH for fibroids.   Screening for cervical cancer - Normal Pap history. No longer screening per guidelines.   Screening for breast cancer - Normal mammogram history.  Continue annual screenings.  Normal breast exam today.  Screening for colon cancer - 2021 colonoscopy. Screenings no longer recommended.   Screening for osteoporosis - Normal bone density 04/2020. Managed by PCP. Continue Vitamin D + Calcium supplement. Increase exercise - [facility] [place] [facility].   No follow-ups on file.  Olivia Mackie [facility], 7:49 AM 07/20/2023

## 2023-07-20 NOTE — Assessment & Plan Note (Signed)
Cont on Amlodipine, Losartan BP Readings from Last 3 Encounters:  07/20/23 118/70  03/27/23 103/67  03/19/23 122/70

## 2023-07-20 NOTE — Assessment & Plan Note (Signed)
Mild B Check [place]

## 2023-07-20 NOTE — Assessment & Plan Note (Signed)
On B12

## 2023-07-20 NOTE — Assessment & Plan Note (Signed)
2012 due to lymphoma Chemo Rx (?)  Worse Nephrology f/u consult Hydrate well

## 2023-07-20 NOTE — Progress Notes (Signed)
Subjective:  Patient ID: Christine Morales, female    DOB: 20-Sep-1945  Age: 78 y.o. MRN: [id number]  CC: Medical Management of Chronic Issues (4 MNTH F/U)   HPI Christine Morales presents for CRI, HTN, hypothyrpoidism  Outpatient Medications Prior to Visit  Medication Sig Dispense Refill   acetaminophen (TYLENOL) 500 MG tablet Take 1,000 mg by mouth 2 (two) times daily as needed for headache.     amLODipine (NORVASC) 5 MG tablet Take 1 tablet (5 mg total) by mouth daily. 90 tablet 3   Aspirin Effervescent (ALKA-SELTZER ORIGINAL PO) Take 1 tablet by mouth daily as needed (body aches).     cetirizine (ZYRTEC) 5 MG tablet Take 1 tablet (5 mg total) by mouth daily. 14 tablet 0   Cholecalciferol (VITAMIN D) 50 MCG (2000 UT) tablet Take 1 tablet (2,000 Units total) by mouth daily. 90 tablet 3   cyanocobalamin (VITAMIN B12) 1000 MCG tablet Take 1 tablet (1,000 mcg total) by mouth daily. 90 tablet 1   famotidine (PEPCID) 10 MG tablet Take 1 tablet (10 mg total) by mouth at bedtime. 14 tablet 0   levothyroxine (SYNTHROID) 100 MCG tablet TAKE 1 TABLET BY MOUTH EVERY DAY 90 tablet 3   losartan (COZAAR) 100 MG tablet Take 1 tablet (100 mg total) by mouth daily. 90 tablet 3   Multiple Vitamin (MULTIVITAMIN WITH MINERALS) TABS tablet Take 1 tablet by mouth daily. Geritol     triamcinolone cream (KENALOG) 0.1 % Apply 1 Application topically 2 (two) times daily. 30 g 0   ferrous sulfate 325 (65 FE) MG tablet Take 1 tablet (325 mg total) by mouth daily. (Patient not taking: Reported on 07/20/2023) 90 tablet 1   Facility-Administered Medications Prior to Visit  Medication Dose Route Frequency Provider Last Rate Last Admin   heparin lock flush 100 unit/mL  500 Units Intravenous Once Shadad, Blenda Nicely, MD       sodium chloride 0.9 % injection 10 mL  10 mL Intravenous PRN Benjiman Core, MD       sodium chloride 0.9 % injection 10 mL  10 mL Intravenous PRN Benjiman Core, MD   10 mL at 05/20/17 0956    ROS: Review  of Systems  Constitutional:  Negative for activity change, appetite change, chills, fatigue and unexpected weight change.  HENT:  Negative for congestion, mouth sores and sinus pressure.   Eyes:  Negative for visual disturbance.  Respiratory:  Negative for cough and chest tightness.   Gastrointestinal:  Negative for abdominal pain and nausea.  Genitourinary:  Negative for difficulty urinating, frequency and vaginal pain.  Musculoskeletal:  Negative for back pain and gait problem.  Skin:  Negative for pallor and rash.  Neurological:  Negative for dizziness, tremors, weakness, numbness and headaches.  Psychiatric/Behavioral:  Negative for confusion and sleep disturbance.     Objective:  BP 118/70 (BP Location: Right Arm, Patient Position: Sitting, Cuff Size: Normal)   Pulse 69   Temp 98.3 F (36.8 C) (Oral)   Ht 5\' 5"  (1.651 m)   Wt 205 lb (93 kg)   SpO2 97%   BMI 34.11 kg/m   BP Readings from Last 3 Encounters:  07/20/23 118/70  03/27/23 103/67  03/19/23 122/70    Wt Readings from Last 3 Encounters:  07/20/23 205 lb (93 kg)  04/24/23 206 lb (93.4 kg)  03/27/23 206 lb (93.4 kg)    Physical Exam Constitutional:      General: She is not in acute  distress.    Appearance: She is well-developed. She is obese.  HENT:     Head: Normocephalic.     Right Ear: External ear normal.     Left Ear: External ear normal.     Nose: Nose normal.  Eyes:     General:        Right eye: No discharge.        Left eye: No discharge.     Conjunctiva/sclera: Conjunctivae normal.     Pupils: Pupils are equal, round, and reactive to light.  Neck:     Thyroid: No thyromegaly.     Vascular: No JVD.     Trachea: No tracheal deviation.  Cardiovascular:     Rate and Rhythm: Normal rate and regular rhythm.     Heart sounds: Normal heart sounds.  Pulmonary:     Effort: No respiratory distress.     Breath sounds: No stridor. No wheezing.  Abdominal:     General: Bowel sounds are normal. There  is no distension.     Palpations: Abdomen is soft. There is no mass.     Tenderness: There is no abdominal tenderness. There is no guarding or rebound.  Musculoskeletal:        General: No tenderness.     Cervical back: Normal range of motion and neck supple. No rigidity.  Lymphadenopathy:     Cervical: No cervical adenopathy.  Skin:    Findings: No erythema or rash.  Neurological:     Cranial Nerves: No cranial nerve deficit.     Motor: No abnormal muscle tone.     Coordination: Coordination normal.     Deep Tendon Reflexes: Reflexes normal.  Psychiatric:        Behavior: Behavior normal.        Thought Content: Thought content normal.        Judgment: Judgment normal.   Mild carotid bruit B  Lab Results  Component Value Date   WBC 4.0 03/19/2023   HGB 12.6 03/19/2023   HCT 37.9 03/19/2023   PLT 249.0 03/19/2023   GLUCOSE 81 03/19/2023   CHOL 197 09/18/2022   TRIG 108.0 09/18/2022   HDL 61.10 09/18/2022   LDLCALC 114 (H) 09/18/2022   ALT 9 03/19/2023   AST 16 03/19/2023   NA 141 03/19/2023   K 4.6 03/19/2023   CL 106 03/19/2023   CREATININE 1.96 (H) 03/19/2023   BUN 26 (H) 03/19/2023   CO2 27 03/19/2023   TSH 0.74 03/19/2023   INR 0.94 12/15/2017   HGBA1C 6.1 03/19/2023    No results found.  Assessment & Plan:   Problem List Items Addressed This Visit     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HCC)   Remote No sx's at present      Relevant Orders   CBC with Differential/Platelet   Hemoglobin A1c   Essential hypertension   Cont on Amlodipine, Losartan BP Readings from Last 3 Encounters:  07/20/23 118/70  03/27/23 103/67  03/19/23 122/70         Relevant Orders   Comprehensive metabolic panel   T4, free   CBC with Differential/Platelet   Hemoglobin A1c   B12 deficiency   On B12      CRF (chronic renal failure) - Primary   2012 due to lymphoma Chemo Rx (?)  Worse Nephrology f/u consult Hydrate well       Relevant Orders    Ambulatory referral to Nephrology   Comprehensive metabolic panel   T4,  free   CBC with Differential/Platelet   Hemoglobin A1c   Bilateral carotid bruits   Mild B Check [place]      Relevant Orders   US Carotid Bilateral      No orders of the defined types were placed in this encounter.     Follow-up: Return in about 4 months (around 11/17/2023) for a follow-up visit.  Sonda Primes, MD

## 2023-09-11 DIAGNOSIS — N390 Urinary tract infection, site not specified: Secondary | ICD-10-CM | POA: Diagnosis not present

## 2023-09-11 DIAGNOSIS — N184 Chronic kidney disease, stage 4 (severe): Secondary | ICD-10-CM | POA: Diagnosis not present

## 2023-09-11 DIAGNOSIS — I129 Hypertensive chronic kidney disease with stage 1 through stage 4 chronic kidney disease, or unspecified chronic kidney disease: Secondary | ICD-10-CM | POA: Diagnosis not present

## 2023-09-11 DIAGNOSIS — R7303 Prediabetes: Secondary | ICD-10-CM | POA: Diagnosis not present

## 2023-09-13 LAB — LAB REPORT - SCANNED
Creatinine, POC: 48.8 mg/dL
EGFR: 29

## 2023-10-01 ENCOUNTER — Other Ambulatory Visit: Payer: Self-pay | Admitting: Internal Medicine

## 2023-10-01 ENCOUNTER — Encounter: Payer: Self-pay | Admitting: Oncology

## 2023-10-01 ENCOUNTER — Encounter: Payer: Self-pay | Admitting: Emergency Medicine

## 2023-10-01 ENCOUNTER — Ambulatory Visit (INDEPENDENT_AMBULATORY_CARE_PROVIDER_SITE_OTHER)

## 2023-10-01 ENCOUNTER — Ambulatory Visit (INDEPENDENT_AMBULATORY_CARE_PROVIDER_SITE_OTHER): Admitting: Emergency Medicine

## 2023-10-01 VITALS — BP 128/60 | HR 71 | Temp 97.7°F | Ht 65.0 in | Wt 207.0 lb

## 2023-10-01 DIAGNOSIS — M79661 Pain in right lower leg: Secondary | ICD-10-CM

## 2023-10-01 NOTE — Assessment & Plan Note (Signed)
 Clinically stable.  No red flag signs or symptoms. Unremarkable physical exam. Unremarkable x-ray done today.  Images reviewed by me. Pain management discussed. Most likely musculoskeletal No signs of DVT or peripheral artery disease No signs of cellulitis or chronic venous insufficiency

## 2023-10-01 NOTE — Progress Notes (Signed)
 Christine Morales 78 y.o.   Chief Complaint  Patient presents with   Leg Pain    Patient is having Right leg pain for about to 2 days and its along her shin area. She does state its a sharp pain that's on and off. She said she took a tylenol for the pain but did not help.     HISTORY OF PRESENT ILLNESS: Acute problem visit today.  Patient of Dr. Sonda Primes This is a 78 y.o. female complaining of pain to right lower leg shin area for the past 2 days.  Denies injury. No other complaints or medical concerns today.  Leg Pain      Prior to Admission medications   Medication Sig Start Date End Date Taking? Authorizing Provider  acetaminophen (TYLENOL) 500 MG tablet Take 1,000 mg by mouth 2 (two) times daily as needed for headache.   Yes [provider]  amLODipine (NORVASC) 5 MG tablet Take 1 tablet (5 mg total) by mouth daily. 09/18/22  Yes Plotnikov, Georgina Quint, MD  Aspirin Effervescent (ALKA-SELTZER ORIGINAL PO) Take 1 tablet by mouth daily as needed (body aches).   Yes [provider]  Cholecalciferol (VITAMIN D) 50 MCG (2000 UT) tablet Take 1 tablet (2,000 Units total) by mouth daily. 09/18/22  Yes Plotnikov, Georgina Quint, MD  cyanocobalamin (VITAMIN B12) 1000 MCG tablet Take 1 tablet (1,000 mcg total) by mouth daily. 09/18/22  Yes Plotnikov, Georgina Quint, MD  levothyroxine (SYNTHROID) 100 MCG tablet TAKE 1 TABLET BY MOUTH EVERY DAY 09/18/22  Yes Plotnikov, Georgina Quint, MD  losartan (COZAAR) 100 MG tablet TAKE 1 TABLET(100 MG) BY MOUTH DAILY 10/01/23  Yes Worthy Rancher B, FNP  Multiple Vitamin (MULTIVITAMIN WITH MINERALS) TABS tablet Take 1 tablet by mouth daily. Geritol   Yes [provider]  cetirizine (ZYRTEC) 5 MG tablet Take 1 tablet (5 mg total) by mouth daily. Patient not taking: Reported on 10/01/2023 03/27/23   Raspet, Noberto Retort, PA-C  famotidine (PEPCID) 10 MG tablet Take 1 tablet (10 mg total) by mouth at bedtime. Patient not taking: Reported on 10/01/2023 03/27/23    Raspet, Noberto Retort, PA-C  ferrous sulfate 325 (65 FE) MG tablet Take 1 tablet (325 mg total) by mouth daily. Patient not taking: Reported on 10/01/2023 09/18/22   Plotnikov, Georgina Quint, MD  triamcinolone cream (KENALOG) 0.1 % Apply 1 Application topically 2 (two) times daily. Patient not taking: Reported on 10/01/2023 03/27/23   Raspet, Noberto Retort, PA-C    No Known Allergies  Patient Active Problem List   Diagnosis Date Noted   Bilateral carotid bruits 07/20/2023   URI (upper respiratory infection) 03/19/2023   Strain of calf muscle, sequela 12/16/2022   Laryngitis 03/20/2022   Eczema 10/30/2021   COVID-19 01/25/2021   Cough 01/25/2021   Hand swelling 11/05/2020   Renal cyst 09/18/2020   Fatty liver 09/18/2020   Dyslipidemia 01/17/2020   Elevated glucose 01/16/2020   History of total abdominal hysterectomy 12/21/2019   Arthralgia of foot, left 12/23/2018   Vertigo 10/21/2017   Morbid obesity (HCC) 10/21/2017   Acute bronchitis 07/05/2015   Traumatic hematoma of foot 02/12/2015   Hypothyroidism 09/13/2013   Abdominal pain, left lower quadrant 09/07/2012   Well adult exam 03/08/2012   CRF (chronic renal failure) 09/03/2011   B12 deficiency 11/22/2010   THRUSH 03/14/2010   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   ANEMIA-IRON DEFICIENCY 12/28/2009   Hemorrhoids 12/28/2009   Helicobacter pylori infection 11/23/2009   NAUSEA  11/23/2009   Backache 12/21/2008   OTHER SYMPTOMS INVOLVING DIGESTIVE SYSTEM OTHER 06/19/2008   SWEATING 03/24/2008   Flank pain 03/24/2008   Pain in limb 01/24/2008   Essential hypertension 12/01/2007   Allergic rhinitis 12/01/2007   Blood in stool 12/01/2007   Personal history presenting hazards to health 12/01/2007    Past Medical History:  Diagnosis Date   Anemia    iron def   GERD (gastroesophageal reflux disease)    Gout    Helicobacter pylori ab+    Hemorrhoids    Hypertension    nhl dx'd 12/2009   chemo comp 2011   Non-Hodgkin  lymphoma (HCC) 12/2009   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      Rhinitis, allergic    Thyroid disease    Ulcer    peptic    Past Surgical History:  Procedure Laterality Date   ABDOMINAL HYSTERECTOMY     BREAST REDUCTION SURGERY     COLON SURGERY     partial colectomy   COLONOSCOPY  07/2006   IR REMOVAL TUN ACCESS W/ PORT W/O FL MOD SED  12/15/2017   PARTIAL COLECTOMY     dr blackmon   TUBAL LIGATION      Social History   Socioeconomic History   Marital status: Widowed    Spouse name: Not on file   Number of children: 2   Years of education: Not on file   Highest education level: Not on file  Occupational History   Occupation: retired    Associate Professor: [employer]  Tobacco Use   Smoking status: Never   Smokeless tobacco: Never  Vaping Use   Vaping status: Never Used  Substance and Sexual Activity   Alcohol use: No    Alcohol/week: 0.0 standard drinks of alcohol   Drug use: No   Sexual activity: Not Currently    Comment: intercourse age 73, less than 5 sexual partners, des neg  Other Topics Concern   Not on file  Social History Narrative   Live with spouse; have 2 children;   Lives in the area   [place] children x 5;    Social Drivers of Health   Financial Resource Strain: Low Risk  (01/19/2023)   Overall Financial Resource Strain (CARDIA)    Difficulty of Paying Living Expenses: Not hard at all  Food Insecurity: No Food Insecurity (01/19/2023)   Hunger Vital Sign    Worried About Running Out of Food in the Last Year: Never true    Ran Out of Food in the Last Year: Never true  Transportation Needs: No Transportation Needs (01/19/2023)   PRAPARE - Administrator, Civil Service (Medical): No    Lack of Transportation (Non-Medical): No  Physical Activity: Sufficiently Active (01/19/2023)   Exercise Vital Sign    Days of Exercise per Week: 5 days    Minutes of Exercise per Session: 30 min  Stress: No Stress Concern Present  (01/19/2023)   [employer] of Occupational Health - Occupational Stress Questionnaire    Feeling of Stress : Not at all  Social Connections: Moderately Integrated (01/19/2023)   Social Connection and Isolation Panel [NHANES]    Frequency of Communication with Friends and Family: More than three times a week    Frequency of Social Gatherings with Friends and Family: More than three times a week    Attends Religious Services: More than 4 times per year    Active Member of [employer] or Organizations: Yes  Attends Banker Meetings: 1 to 4 times per year    Marital Status: Widowed  Intimate Partner Violence: Not At Risk (01/19/2023)   Humiliation, Afraid, Rape, and Kick questionnaire    Fear of Current or Ex-Partner: No    Emotionally Abused: No    Physically Abused: No    Sexually Abused: No    Family History  Problem Relation Age of Onset   Heart disease Mother    Cancer Father        ? type   Heart disease Sister    Diabetes Brother    Hypertension Other        FH   Cholelithiasis Neg Hx    Colon cancer Neg Hx    Colon polyps Neg Hx    Esophageal cancer Neg Hx    Stomach cancer Neg Hx    Rectal cancer Neg Hx      Review of Systems  Constitutional: Negative.  Negative for chills and fever.  HENT: Negative.  Negative for congestion and sore throat.   Respiratory: Negative.  Negative for cough and shortness of breath.   Cardiovascular: Negative.  Negative for chest pain and palpitations.  Gastrointestinal:  Negative for abdominal pain, diarrhea, nausea and vomiting.  Genitourinary: Negative.  Negative for dysuria and hematuria.  Skin: Negative.  Negative for rash.  Neurological: Negative.  Negative for dizziness and headaches.  All other systems reviewed and are negative.   Today's Vitals   10/01/23 1330  BP: 128/60  Pulse: 71  Temp: 97.7 F (36.5 C)  TempSrc: Oral  SpO2: 98%  Weight: 207 lb (93.9 kg)  Height: 5\' 5"  (1.651 m)   Body mass index is  34.45 kg/m.   Physical Exam Vitals reviewed.  Constitutional:      Appearance: Normal appearance.  HENT:     Head: Normocephalic.  Eyes:     Extraocular Movements: Extraocular movements intact.  Cardiovascular:     Rate and Rhythm: Normal rate.  Pulmonary:     Effort: Pulmonary effort is normal.  Musculoskeletal:     Comments: Right lower extremity: Warm to touch.  Good distal circulation with excellent capillary refill.  No signs of chronic venous insufficiency.  Good distal sensation.  No signs of DVT.  No ecchymosis or significant swelling.  Mild tenderness to mid anterior shin area No other abnormal findings.  Skin:    General: Skin is warm and dry.  Neurological:     Mental Status: She is alert and oriented to person, place, and time.  Psychiatric:        Mood and Affect: Mood normal.        Behavior: Behavior normal.    DG Tibia/Fibula Right Result Date: 10/01/2023 CLINICAL DATA:  Acute right lower leg pain for several days without known injury. EXAM: RIGHT TIBIA AND FIBULA - 2 VIEW COMPARISON:  None Available. FINDINGS: There is no evidence of fracture or other focal bone lesions. Soft tissues are unremarkable. IMPRESSION: Negative. Electronically Signed   By: Lupita Raider M.D.   On: 10/01/2023 16:20     ASSESSMENT & PLAN: A total of 33 minutes was spent with the patient and counseling/coordination of care regarding preparing for this visit, review of most recent office visit notes, review of chronic medical conditions under management, review of all medications, review of x-ray images done today, pain management, prognosis, documentation and need for follow-up if no better or worse during the next several days.  Problem List Items Addressed  This Visit       Other   Pain in limb - Primary   Clinically stable.  No red flag signs or symptoms. Unremarkable physical exam. Unremarkable x-ray done today.  Images reviewed by me. Pain management discussed. Most likely  musculoskeletal No signs of DVT or peripheral artery disease No signs of cellulitis or chronic venous insufficiency      Relevant Orders   DG Tibia/Fibula Right (Completed)   Patient Instructions  Health Maintenance After Age 43 After age 69, you are at a higher risk for certain long-term diseases and infections as well as injuries from falls. Falls are a major cause of broken bones and head injuries in people who are older than age 71. Getting regular preventive care can help to keep you healthy and well. Preventive care includes getting regular testing and making lifestyle changes as recommended by your health care provider. Talk with your health care provider about: Which screenings and tests you should have. A screening is a test that checks for a disease when you have no symptoms. A diet and exercise plan that is right for you. What should I know about screenings and tests to prevent falls? Screening and testing are the best ways to find a health problem early. Early diagnosis and treatment give you the best chance of managing medical conditions that are common after age 65. Certain conditions and lifestyle choices may make you more likely to have a fall. Your health care provider may recommend: Regular vision checks. Poor vision and conditions such as cataracts can make you more likely to have a fall. If you wear glasses, make sure to get your prescription updated if your vision changes. Medicine review. Work with your health care provider to regularly review all of the medicines you are taking, including over-the-counter medicines. Ask your health care provider about any side effects that may make you more likely to have a fall. Tell your health care provider if any medicines that you take make you feel dizzy or sleepy. Strength and balance checks. Your health care provider may recommend certain tests to check your strength and balance while standing, walking, or changing positions. Foot  health exam. Foot pain and numbness, as well as not wearing proper footwear, can make you more likely to have a fall. Screenings, including: Osteoporosis screening. Osteoporosis is a condition that causes the bones to get weaker and break more easily. Blood pressure screening. Blood pressure changes and medicines to control blood pressure can make you feel dizzy. Depression screening. You may be more likely to have a fall if you have a fear of falling, feel depressed, or feel unable to do activities that you used to do. Alcohol use screening. Using too much alcohol can affect your balance and may make you more likely to have a fall. Follow these instructions at home: Lifestyle Do not drink alcohol if: Your health care provider tells you not to drink. If you drink alcohol: Limit how much you have to: 0-1 drink a day for women. 0-2 drinks a day for men. Know how much alcohol is in your drink. In the [place] one drink equals one 12 oz bottle of beer (355 mL), one 5 oz glass of wine (148 mL), or one 1 oz glass of hard liquor (44 mL). Do not use any products that contain nicotine or tobacco. These products include cigarettes, chewing tobacco, and vaping devices, such as e-cigarettes. If you need help quitting, ask your health care provider. Activity  Follow  a regular exercise program to stay fit. This will help you maintain your balance. Ask your health care provider what types of exercise are appropriate for you. If you need a cane or walker, use it as recommended by your health care provider. Wear supportive shoes that have nonskid soles. Safety  Remove any tripping hazards, such as rugs, cords, and clutter. Install safety equipment such as grab bars in bathrooms and safety rails on stairs. Keep rooms and walkways well-lit. General instructions Talk with your health care provider about your risks for falling. Tell your health care provider if: You fall. Be sure to tell your health care  provider about all falls, even ones that seem minor. You feel dizzy, tiredness (fatigue), or off-balance. Take over-the-counter and prescription medicines only as told by your health care provider. These include supplements. Eat a healthy diet and maintain a healthy weight. A healthy diet includes low-fat dairy products, low-fat (lean) meats, and fiber from whole grains, beans, and lots of fruits and vegetables. Stay current with your vaccines. Schedule regular health, dental, and eye exams. Summary Having a healthy lifestyle and getting preventive care can help to protect your health and wellness after age 87. Screening and testing are the best way to find a health problem early and help you avoid having a fall. Early diagnosis and treatment give you the best chance for managing medical conditions that are more common for people who are older than age 79. Falls are a major cause of broken bones and head injuries in people who are older than age 72. Take precautions to prevent a fall at home. Work with your health care provider to learn what changes you can make to improve your health and wellness and to prevent falls. This information is not intended to replace advice given to you by your health care provider. Make sure you discuss any questions you have with your health care provider. Document Revised: 10/29/2020 Document Reviewed: 10/29/2020 Elsevier Patient Education  2024 Elsevier Inc.    Edwina Barth, MD [facility]

## 2023-10-01 NOTE — Patient Instructions (Signed)
 Health Maintenance After Age 76 After age 4, you are at a higher risk for certain long-term diseases and infections as well as injuries from falls. Falls are a major cause of broken bones and head injuries in people who are older than age 47. Getting regular preventive care can help to keep you healthy and well. Preventive care includes getting regular testing and making lifestyle changes as recommended by your health care provider. Talk with your health care provider about: Which screenings and tests you should have. A screening is a test that checks for a disease when you have no symptoms. A diet and exercise plan that is right for you. What should I know about screenings and tests to prevent falls? Screening and testing are the best ways to find a health problem early. Early diagnosis and treatment give you the best chance of managing medical conditions that are common after age 37. Certain conditions and lifestyle choices may make you more likely to have a fall. Your health care provider may recommend: Regular vision checks. Poor vision and conditions such as cataracts can make you more likely to have a fall. If you wear glasses, make sure to get your prescription updated if your vision changes. Medicine review. Work with your health care provider to regularly review all of the medicines you are taking, including over-the-counter medicines. Ask your health care provider about any side effects that may make you more likely to have a fall. Tell your health care provider if any medicines that you take make you feel dizzy or sleepy. Strength and balance checks. Your health care provider may recommend certain tests to check your strength and balance while standing, walking, or changing positions. Foot health exam. Foot pain and numbness, as well as not wearing proper footwear, can make you more likely to have a fall. Screenings, including: Osteoporosis screening. Osteoporosis is a condition that causes  the bones to get weaker and break more easily. Blood pressure screening. Blood pressure changes and medicines to control blood pressure can make you feel dizzy. Depression screening. You may be more likely to have a fall if you have a fear of falling, feel depressed, or feel unable to do activities that you used to do. Alcohol use screening. Using too much alcohol can affect your balance and may make you more likely to have a fall. Follow these instructions at home: Lifestyle Do not drink alcohol if: Your health care provider tells you not to drink. If you drink alcohol: Limit how much you have to: 0-1 drink a day for women. 0-2 drinks a day for men. Know how much alcohol is in your drink. In the [place] one drink equals one 12 oz bottle of beer (355 mL), one 5 oz glass of wine (148 mL), or one 1 oz glass of hard liquor (44 mL). Do not use any products that contain nicotine or tobacco. These products include cigarettes, chewing tobacco, and vaping devices, such as e-cigarettes. If you need help quitting, ask your health care provider. Activity  Follow a regular exercise program to stay fit. This will help you maintain your balance. Ask your health care provider what types of exercise are appropriate for you. If you need a cane or walker, use it as recommended by your health care provider. Wear supportive shoes that have nonskid soles. Safety  Remove any tripping hazards, such as rugs, cords, and clutter. Install safety equipment such as grab bars in bathrooms and safety rails on stairs. Keep rooms and walkways  well-lit. General instructions Talk with your health care provider about your risks for falling. Tell your health care provider if: You fall. Be sure to tell your health care provider about all falls, even ones that seem minor. You feel dizzy, tiredness (fatigue), or off-balance. Take over-the-counter and prescription medicines only as told by your health care provider. These include  supplements. Eat a healthy diet and maintain a healthy weight. A healthy diet includes low-fat dairy products, low-fat (lean) meats, and fiber from whole grains, beans, and lots of fruits and vegetables. Stay current with your vaccines. Schedule regular health, dental, and eye exams. Summary Having a healthy lifestyle and getting preventive care can help to protect your health and wellness after age 11. Screening and testing are the best way to find a health problem early and help you avoid having a fall. Early diagnosis and treatment give you the best chance for managing medical conditions that are more common for people who are older than age 28. Falls are a major cause of broken bones and head injuries in people who are older than age 48. Take precautions to prevent a fall at home. Work with your health care provider to learn what changes you can make to improve your health and wellness and to prevent falls. This information is not intended to replace advice given to you by your health care provider. Make sure you discuss any questions you have with your health care provider. Document Revised: 10/29/2020 Document Reviewed: 10/29/2020 Elsevier Patient Education  2024 [employer].

## 2023-10-05 ENCOUNTER — Other Ambulatory Visit: Payer: Self-pay | Admitting: Internal Medicine

## 2023-10-15 ENCOUNTER — Encounter: Payer: Self-pay | Admitting: Radiology

## 2023-10-15 NOTE — Progress Notes (Signed)
 Letter sent with results to patients address on file

## 2023-10-22 ENCOUNTER — Encounter: Payer: Self-pay | Admitting: Nurse Practitioner

## 2023-10-22 ENCOUNTER — Ambulatory Visit (INDEPENDENT_AMBULATORY_CARE_PROVIDER_SITE_OTHER): Payer: Medicare PPO | Admitting: Nurse Practitioner

## 2023-10-22 VITALS — BP 116/74 | HR 63 | Ht 65.25 in | Wt 205.0 lb

## 2023-10-22 DIAGNOSIS — Z01419 Encounter for gynecological examination (general) (routine) without abnormal findings: Secondary | ICD-10-CM

## 2023-10-22 DIAGNOSIS — Z780 Asymptomatic menopausal state: Secondary | ICD-10-CM

## 2023-10-22 NOTE — Progress Notes (Signed)
 Christine Morales 12-29-1945 [id number]   History:  78 y.o. G2 P2 presents for breast and pelvic exam without GYN complaints. Postmenopausal - no HRT. S/P 2008 TAH for fibroids. Normal pap history. Primary care manages hypothyroidism, HTN. 2011 diagnosed with non-hodgkin's lymphoma.   Gynecologic History No LMP recorded. Patient has had a hysterectomy.   Contraception: status post hysterectomy Sexually active: No  Health maintenance Last Pap: 01/06/2014. Results were: Normal  Last mammogram: 05/08/2023. Results were: Normal Last colonoscopy: 04/11/2020. Results were: Tubular adenoma, no repeat recommended Last Dexa: 04/24/2020. Results were: Normal  Past medical history, past surgical history, family history and social history were all reviewed and documented in the EPIC chart. Widowed. Son in [place], daughter in [place]. Grandson lives with patient, he is in middle school. 6 grandchildren. 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   ROS:  A ROS was performed and pertinent positives and negatives are included.  Exam:  Vitals:   10/22/23 0855  BP: 116/74  Pulse: 63  SpO2: 95%  Weight: 205 lb (93 kg)  Height: 5' 5.25" (1.657 m)     Body mass index is 33.85 kg/m.  General appearance:  Normal Thyroid :  Symmetrical, normal in size, without palpable masses or nodularity. Respiratory  Auscultation:  Clear without wheezing or rhonchi Cardiovascular  Auscultation:  Regular rate, without rubs, murmurs or gallops  Edema/varicosities:  Not grossly evident Abdominal  Soft,nontender, without masses, guarding or rebound.  Liver/spleen:  No organomegaly noted  Hernia:  None appreciated  Skin  Inspection:  Grossly normal   Breasts: Examined lying and sitting. Bilateral breast reduction scars  Right: Without masses, retractions, discharge or axillary adenopathy.   Left: Without masses, retractions, discharge or axillary adenopathy. Pelvic: External genitalia:  no lesions               Urethra:  normal appearing urethra with no masses, tenderness or lesions              Bartholins and Skenes: normal                 Vagina: normal appearing vagina with normal color and discharge, no lesions. Atrophic changes              Cervix: absent Bimanual Exam:  Uterus:  absent              Adnexa: no mass, fullness, tenderness              Rectovaginal: Deferred              Anus:  normal, no lesions  Patient informed chaperone available to be present for breast and pelvic exam. Patient has requested no chaperone to be present. Patient has been advised what will be completed during breast and pelvic exam.   Assessment/Plan:  78 y.o. G2 P2 for breast and pelvic exam.   Encounter for breast and pelvic examination - Education provided on SBEs, importance of preventative screenings, current guidelines, high calcium diet, regular exercise, and multivitamin daily. Labs with PCP.  Postmenopausal - no HRT. S/P 2008 TAH for fibroids.   Screening for cervical cancer - Normal Pap history. No longer screening per guidelines.   Screening for breast cancer - Normal mammogram history.  Continue annual screenings.  Normal breast exam today.  Screening for colon cancer - 2021 colonoscopy. Screenings no longer recommended.   Screening for osteoporosis - Normal bone density 04/2020. Managed by PCP. Continue Vitamin D  + Calcium supplement, increase  exercise.   Return in about 2 years (around 10/21/2025) for B&P.       Andee Bamberger [facility] ([place]), 9:18 AM 10/22/2023

## 2023-11-13 DIAGNOSIS — M1712 Unilateral primary osteoarthritis, left knee: Secondary | ICD-10-CM | POA: Diagnosis not present

## 2023-11-14 ENCOUNTER — Other Ambulatory Visit: Payer: Self-pay | Admitting: Internal Medicine

## 2023-11-17 ENCOUNTER — Ambulatory Visit (INDEPENDENT_AMBULATORY_CARE_PROVIDER_SITE_OTHER): Payer: Medicare PPO | Admitting: Internal Medicine

## 2023-11-17 ENCOUNTER — Encounter: Payer: Self-pay | Admitting: Internal Medicine

## 2023-11-17 VITALS — BP 112/66 | HR 63 | Temp 97.5°F | Ht 65.25 in | Wt 203.8 lb

## 2023-11-17 DIAGNOSIS — I10 Essential (primary) hypertension: Secondary | ICD-10-CM | POA: Diagnosis not present

## 2023-11-17 DIAGNOSIS — M25561 Pain in right knee: Secondary | ICD-10-CM

## 2023-11-17 DIAGNOSIS — M25562 Pain in left knee: Secondary | ICD-10-CM | POA: Diagnosis not present

## 2023-11-17 DIAGNOSIS — E538 Deficiency of other specified B group vitamins: Secondary | ICD-10-CM | POA: Diagnosis not present

## 2023-11-17 DIAGNOSIS — N184 Chronic kidney disease, stage 4 (severe): Secondary | ICD-10-CM

## 2023-11-17 DIAGNOSIS — G8929 Other chronic pain: Secondary | ICD-10-CM | POA: Insufficient documentation

## 2023-11-17 MED ORDER — TRAMADOL HCL 50 MG PO TABS: 50.0000 mg | ORAL_TABLET | Freq: Four times a day (QID) | ORAL | 0 refills | Status: DC | PRN

## 2023-11-17 NOTE — Assessment & Plan Note (Addendum)
 L>R knee OA- pt saw Dr Constancia Delton w/[facility], had knee inj in the L knee, given Meloxicam   Tramadol  prn  Potential benefits of a long term opioids use as well as potential risks (i.e. addiction risk, apnea etc) and complications (i.e. Somnolence, constipation and others) were explained to the patient and were aknowledged. Blue-Emu cream was recommended to use 2-3 times a day

## 2023-11-17 NOTE — Patient Instructions (Addendum)
 Blue-Emu cream -use 2-3 times a day Tylenol as needed Turmeric

## 2023-11-17 NOTE — Assessment & Plan Note (Signed)
 Cont on Amlodipine , Losartan  BP Readings from Last 3 Encounters:  11/17/23 112/66  10/22/23 116/74  10/01/23 128/60

## 2023-11-17 NOTE — Progress Notes (Signed)
 Subjective:  Patient ID: Christine Morales, female    DOB: 05/01/1946  Age: 78 y.o. MRN: [id number]  CC: Medical Management of Chronic Issues (4 Month follow up. Diagnosed with arthritis in the left (worse) and right knee. Prescribed meloxicam  to help with this, wants to make sure this is approved by Dr.Yeraldine Forney. Also received steroid injection in that left knee. Interested in physical therapy options and would like to discuss these with PCP)   HPI Christine Morales presents for knee OA- pt saw Ortho, had knee inj in the L knee, given Meloxicam  F/u CRI  Outpatient Medications Prior to Visit  Medication Sig Dispense Refill   acetaminophen (TYLENOL) 500 MG tablet Take 1,000 mg by mouth 2 (two) times daily as needed for headache.     amLODipine  (NORVASC ) 5 MG tablet TAKE 1 TABLET(5 MG) BY MOUTH DAILY 90 tablet 3   Aspirin Effervescent (ALKA-SELTZER ORIGINAL PO) Take 1 tablet by mouth daily as needed (body aches).     Cholecalciferol (VITAMIN D ) 50 MCG (2000 UT) tablet Take 1 tablet (2,000 Units total) by mouth daily. 90 tablet 3   cyanocobalamin  (VITAMIN B12) 1000 MCG tablet Take 1 tablet (1,000 mcg total) by mouth daily. 90 tablet 1   levothyroxine  (SYNTHROID ) 100 MCG tablet TAKE 1 TABLET BY MOUTH EVERY DAY 90 tablet 3   losartan  (COZAAR ) 100 MG tablet TAKE 1 TABLET(100 MG) BY MOUTH DAILY 90 tablet 3   meloxicam  (MOBIC ) 15 MG tablet Take 15 mg by mouth daily.     Multiple Vitamin (MULTIVITAMIN WITH MINERALS) TABS tablet Take 1 tablet by mouth daily. Geritol     cetirizine  (ZYRTEC ) 5 MG tablet Take 1 tablet (5 mg total) by mouth daily. (Patient not taking: Reported on 10/01/2023) 14 tablet 0   famotidine  (PEPCID ) 10 MG tablet Take 1 tablet (10 mg total) by mouth at bedtime. (Patient not taking: Reported on 10/01/2023) 14 tablet 0   ferrous sulfate  325 (65 FE) MG tablet Take 1 tablet (325 mg total) by mouth daily. (Patient not taking: Reported on 11/17/2023) 90 tablet 1   triamcinolone  cream (KENALOG ) 0.1  % Apply 1 Application topically 2 (two) times daily. (Patient not taking: Reported on 10/01/2023) 30 g 0   Facility-Administered Medications Prior to Visit  Medication Dose Route Frequency Provider Last Rate Last Admin   heparin  lock flush 100 unit/mL  500 Units Intravenous Once Shadad, Firas N, MD       sodium chloride  0.9 % injection 10 mL  10 mL Intravenous PRN Shadad, Firas N, MD       sodium chloride  0.9 % injection 10 mL  10 mL Intravenous PRN Shadad, Firas N, MD   10 mL at 05/20/17 0956    ROS: Review of Systems  Constitutional:  Negative for activity change, appetite change, chills, fatigue and unexpected weight change.  HENT:  Negative for congestion, mouth sores and sinus pressure.   Eyes:  Negative for visual disturbance.  Respiratory:  Negative for cough and chest tightness.   Gastrointestinal:  Negative for abdominal pain and nausea.  Genitourinary:  Negative for difficulty urinating, frequency and vaginal pain.  Musculoskeletal:  Positive for arthralgias and gait problem. Negative for back pain.  Skin:  Negative for pallor and rash.  Neurological:  Negative for dizziness, tremors, weakness, numbness and headaches.  Psychiatric/Behavioral:  Negative for confusion and sleep disturbance.     Objective:  BP 112/66   Pulse 63   Temp (!) 97.5 F (36.4 C)  Ht 5' 5.25" (1.657 m)   Wt 203 lb 12.8 oz (92.4 kg)   SpO2 99%   BMI 33.65 kg/m   BP Readings from Last 3 Encounters:  11/17/23 112/66  10/22/23 116/74  10/01/23 128/60    Wt Readings from Last 3 Encounters:  11/17/23 203 lb 12.8 oz (92.4 kg)  10/22/23 205 lb (93 kg)  10/01/23 207 lb (93.9 kg)    Physical Exam Constitutional:      General: She is not in acute distress.    Appearance: She is well-developed. She is obese.  HENT:     Head: Normocephalic.     Right Ear: External ear normal.     Left Ear: External ear normal.     Nose: Nose normal.  Eyes:     General:        Right eye: No discharge.         Left eye: No discharge.     Conjunctiva/sclera: Conjunctivae normal.     Pupils: Pupils are equal, round, and reactive to light.  Neck:     Thyroid : No thyromegaly.     Vascular: No JVD.     Trachea: No tracheal deviation.  Cardiovascular:     Rate and Rhythm: Normal rate and regular rhythm.     Heart sounds: Normal heart sounds.  Pulmonary:     Effort: No respiratory distress.     Breath sounds: No stridor. No wheezing.  Abdominal:     General: Bowel sounds are normal. There is no distension.     Palpations: Abdomen is soft. There is no mass.     Tenderness: There is no abdominal tenderness. There is no guarding or rebound.  Musculoskeletal:        General: No tenderness.     Cervical back: Normal range of motion and neck supple. No rigidity.  Lymphadenopathy:     Cervical: No cervical adenopathy.  Skin:    Findings: No erythema or rash.  Neurological:     Cranial Nerves: No cranial nerve deficit.     Motor: No abnormal muscle tone.     Coordination: Coordination normal.     Deep Tendon Reflexes: Reflexes normal.  Psychiatric:        Behavior: Behavior normal.        Thought Content: Thought content normal.        Judgment: Judgment normal.   L>R knee pain  Lab Results  Component Value Date   WBC 4.4 07/20/2023   HGB 13.0 07/20/2023   HCT 39.0 07/20/2023   PLT 265.0 07/20/2023   GLUCOSE 87 07/20/2023   CHOL 197 09/18/2022   TRIG 108.0 09/18/2022   HDL 61.10 09/18/2022   LDLCALC 114 (H) 09/18/2022   ALT 11 07/20/2023   AST 18 07/20/2023   NA 141 07/20/2023   K 4.4 07/20/2023   CL 104 07/20/2023   CREATININE 1.75 (H) 07/20/2023   BUN 23 07/20/2023   CO2 28 07/20/2023   TSH 0.74 03/19/2023   INR 0.94 12/15/2017   HGBA1C 6.1 07/20/2023    No results found.  Assessment & Plan:   Problem List Items Addressed This Visit     Essential hypertension   Cont on Amlodipine , Losartan  BP Readings from Last 3 Encounters:  11/17/23 112/66  10/22/23 116/74   10/01/23 128/60         Relevant Orders   Comprehensive metabolic panel with GFR   B12 deficiency   On B12      Relevant Orders  CBC with Differential/Platelet   Vitamin B12   CRF (chronic renal failure)    D/c Meloxicam  Nephrology f/u consult Hydrate well       Relevant Orders   Comprehensive metabolic panel with GFR   Knee pain, chronic - Primary    L>R knee OA- pt saw Dr Constancia Delton w/[facility], had knee inj in the L knee, given Meloxicam   Tramadol  prn  Potential benefits of a long term opioids use as well as potential risks (i.e. addiction risk, apnea etc) and complications (i.e. Somnolence, constipation and others) were explained to the patient and were aknowledged. Blue-Emu cream was recommended to use 2-3 times a day      Relevant Medications   meloxicam  (MOBIC ) 15 MG tablet   traMADol  (ULTRAM ) 50 MG tablet      Meds ordered this encounter  Medications   traMADol  (ULTRAM ) 50 MG tablet    Sig: Take 1 tablet (50 mg total) by mouth every 6 (six) hours as needed.    Dispense:  20 tablet    Refill:  0      Follow-up: Return in about 2 months (around 01/17/2024) for a follow-up visit.  Anitra Barn, MD

## 2023-11-17 NOTE — Assessment & Plan Note (Signed)
 On B12

## 2023-11-17 NOTE — Assessment & Plan Note (Signed)
  D/c Meloxicam  Nephrology f/u consult Hydrate well

## 2023-12-14 DIAGNOSIS — H40013 Open angle with borderline findings, low risk, bilateral: Secondary | ICD-10-CM | POA: Diagnosis not present

## 2023-12-14 DIAGNOSIS — H35033 Hypertensive retinopathy, bilateral: Secondary | ICD-10-CM | POA: Diagnosis not present

## 2023-12-14 DIAGNOSIS — H2513 Age-related nuclear cataract, bilateral: Secondary | ICD-10-CM | POA: Diagnosis not present

## 2024-01-05 DIAGNOSIS — N184 Chronic kidney disease, stage 4 (severe): Secondary | ICD-10-CM | POA: Diagnosis not present

## 2024-01-14 DIAGNOSIS — N184 Chronic kidney disease, stage 4 (severe): Secondary | ICD-10-CM | POA: Diagnosis not present

## 2024-01-14 DIAGNOSIS — R7303 Prediabetes: Secondary | ICD-10-CM | POA: Diagnosis not present

## 2024-01-14 DIAGNOSIS — I129 Hypertensive chronic kidney disease with stage 1 through stage 4 chronic kidney disease, or unspecified chronic kidney disease: Secondary | ICD-10-CM | POA: Diagnosis not present

## 2024-01-14 DIAGNOSIS — E872 Acidosis, unspecified: Secondary | ICD-10-CM | POA: Diagnosis not present

## 2024-01-14 DIAGNOSIS — N2581 Secondary hyperparathyroidism of renal origin: Secondary | ICD-10-CM | POA: Diagnosis not present

## 2024-01-22 ENCOUNTER — Encounter: Payer: Self-pay | Admitting: Internal Medicine

## 2024-01-22 ENCOUNTER — Ambulatory Visit: Admitting: Internal Medicine

## 2024-01-22 VITALS — BP 139/84 | HR 57 | Temp 97.3°F | Ht 65.25 in | Wt 201.0 lb

## 2024-01-22 DIAGNOSIS — I10 Essential (primary) hypertension: Secondary | ICD-10-CM | POA: Diagnosis not present

## 2024-01-22 DIAGNOSIS — N184 Chronic kidney disease, stage 4 (severe): Secondary | ICD-10-CM

## 2024-01-22 DIAGNOSIS — M25562 Pain in left knee: Secondary | ICD-10-CM | POA: Diagnosis not present

## 2024-01-22 DIAGNOSIS — E538 Deficiency of other specified B group vitamins: Secondary | ICD-10-CM | POA: Diagnosis not present

## 2024-01-22 DIAGNOSIS — G8929 Other chronic pain: Secondary | ICD-10-CM

## 2024-01-22 DIAGNOSIS — M25561 Pain in right knee: Secondary | ICD-10-CM | POA: Diagnosis not present

## 2024-01-22 DIAGNOSIS — E785 Hyperlipidemia, unspecified: Secondary | ICD-10-CM

## 2024-01-22 MED ORDER — TRAMADOL HCL 50 MG PO TABS: 50.0000 mg | ORAL_TABLET | Freq: Four times a day (QID) | ORAL | 1 refills | Status: AC | PRN

## 2024-01-22 NOTE — Assessment & Plan Note (Signed)
 Seeing Nephrology Cont on Amlodipine , Losartan  BP Readings from Last 3 Encounters:  01/22/24 139/84  11/17/23 112/66  10/22/23 116/74

## 2024-01-22 NOTE — Assessment & Plan Note (Signed)
 Seeing Nephrology Cont on Amlodipine , Losartan 

## 2024-01-22 NOTE — Assessment & Plan Note (Signed)
Monitor lipids A cardiac CT scan for calcium scoring was offered

## 2024-01-22 NOTE — Assessment & Plan Note (Signed)
 On B12

## 2024-01-22 NOTE — Assessment & Plan Note (Signed)
 L>R knee OA- pt saw Dr Irving w/[facility], had knee inj in the L knee, Meloxicam  d/c  Tramadol  prn  Potential benefits of a long term opioids use as well as potential risks (i.e. addiction risk, apnea etc) and complications (i.e. Somnolence, constipation and others) were explained to the patient and were aknowledged. Blue-Emu cream was recommended to use 2-3 times a day

## 2024-01-22 NOTE — Progress Notes (Signed)
 Subjective:  Patient ID: Christine Morales, female    DOB: Nov 16, 1945  Age: 78 y.o. MRN: [id number]  CC: Medical Management of Chronic Issues (2 MNTH F/U)   HPI Christine Morales presents for CRI, OA, knee pain - B, HTN  Outpatient Medications Prior to Visit  Medication Sig Dispense Refill   acetaminophen (TYLENOL) 500 MG tablet Take 1,000 mg by mouth 2 (two) times daily as needed for headache.     amLODipine  (NORVASC ) 5 MG tablet TAKE 1 TABLET(5 MG) BY MOUTH DAILY 90 tablet 3   Aspirin Effervescent (ALKA-SELTZER ORIGINAL PO) Take 1 tablet by mouth daily as needed (body aches).     Cholecalciferol (VITAMIN D ) 50 MCG (2000 UT) tablet Take 1 tablet (2,000 Units total) by mouth daily. 90 tablet 3   cyanocobalamin  (VITAMIN B12) 1000 MCG tablet Take 1 tablet (1,000 mcg total) by mouth daily. 90 tablet 1   levothyroxine  (SYNTHROID ) 100 MCG tablet TAKE 1 TABLET BY MOUTH EVERY DAY 90 tablet 3   losartan  (COZAAR ) 100 MG tablet TAKE 1 TABLET(100 MG) BY MOUTH DAILY 90 tablet 3   meloxicam  (MOBIC ) 15 MG tablet Take 15 mg by mouth daily.     Multiple Vitamin (MULTIVITAMIN WITH MINERALS) TABS tablet Take 1 tablet by mouth daily. Geritol     traMADol  (ULTRAM ) 50 MG tablet Take 1 tablet (50 mg total) by mouth every 6 (six) hours as needed. 20 tablet 0   cetirizine  (ZYRTEC ) 5 MG tablet Take 1 tablet (5 mg total) by mouth daily. (Patient not taking: Reported on 01/22/2024) 14 tablet 0   famotidine  (PEPCID ) 10 MG tablet Take 1 tablet (10 mg total) by mouth at bedtime. (Patient not taking: Reported on 01/22/2024) 14 tablet 0   ferrous sulfate  325 (65 FE) MG tablet Take 1 tablet (325 mg total) by mouth daily. (Patient not taking: Reported on 01/22/2024) 90 tablet 1   triamcinolone  cream (KENALOG ) 0.1 % Apply 1 Application topically 2 (two) times daily. (Patient not taking: Reported on 01/22/2024) 30 g 0   Facility-Administered Medications Prior to Visit  Medication Dose Route Frequency Provider Last Rate Last Admin   heparin   lock flush 100 unit/mL  500 Units Intravenous Once Shadad, Firas N, MD       sodium chloride  0.9 % injection 10 mL  10 mL Intravenous PRN Shadad, Firas N, MD       sodium chloride  0.9 % injection 10 mL  10 mL Intravenous PRN Shadad, Firas N, MD   10 mL at 05/20/17 0956    ROS: Review of Systems  Constitutional:  Negative for activity change, appetite change, chills, fatigue and unexpected weight change.  HENT:  Negative for congestion, mouth sores and sinus pressure.   Eyes:  Negative for visual disturbance.  Respiratory:  Negative for cough and chest tightness.   Gastrointestinal:  Negative for abdominal pain and nausea.  Genitourinary:  Negative for difficulty urinating, frequency and vaginal pain.  Musculoskeletal:  Positive for arthralgias. Negative for back pain and gait problem.  Skin:  Negative for pallor and rash.  Neurological:  Negative for dizziness, tremors, weakness, numbness and headaches.  Psychiatric/Behavioral:  Negative for confusion, sleep disturbance and suicidal ideas.     Objective:  BP 139/84   Pulse (!) 57   Temp (!) 97.3 F (36.3 C) (Oral)   Ht 5' 5.25 (1.657 m)   Wt 201 lb (91.2 kg)   SpO2 98%   BMI 33.19 kg/m   BP Readings from Last 3  Encounters:  01/22/24 139/84  11/17/23 112/66  10/22/23 116/74    Wt Readings from Last 3 Encounters:  01/22/24 201 lb (91.2 kg)  11/17/23 203 lb 12.8 oz (92.4 kg)  10/22/23 205 lb (93 kg)    Physical Exam Constitutional:      General: She is not in acute distress.    Appearance: She is well-developed. She is obese.  HENT:     Head: Normocephalic.     Right Ear: External ear normal.     Left Ear: External ear normal.     Nose: Nose normal.  Eyes:     General:        Right eye: No discharge.        Left eye: No discharge.     Conjunctiva/sclera: Conjunctivae normal.     Pupils: Pupils are equal, round, and reactive to light.  Neck:     Thyroid : No thyromegaly.     Vascular: No JVD.     Trachea: No  tracheal deviation.  Cardiovascular:     Rate and Rhythm: Normal rate and regular rhythm.     Heart sounds: Normal heart sounds.  Pulmonary:     Effort: No respiratory distress.     Breath sounds: No stridor. No wheezing.  Abdominal:     General: Bowel sounds are normal. There is no distension.     Palpations: Abdomen is soft. There is no mass.     Tenderness: There is no abdominal tenderness. There is no guarding or rebound.  Musculoskeletal:        General: No tenderness.     Cervical back: Normal range of motion and neck supple. No rigidity.     Right lower leg: No edema.     Left lower leg: No edema.  Lymphadenopathy:     Cervical: No cervical adenopathy.  Skin:    Findings: No erythema or rash.  Neurological:     Cranial Nerves: No cranial nerve deficit.     Motor: No abnormal muscle tone.     Coordination: Coordination normal.     Deep Tendon Reflexes: Reflexes normal.  Psychiatric:        Behavior: Behavior normal.        Thought Content: Thought content normal.        Judgment: Judgment normal.   Knees w/pain  Lab Results  Component Value Date   WBC 4.4 07/20/2023   HGB 13.0 07/20/2023   HCT 39.0 07/20/2023   PLT 265.0 07/20/2023   GLUCOSE 87 07/20/2023   CHOL 197 09/18/2022   TRIG 108.0 09/18/2022   HDL 61.10 09/18/2022   LDLCALC 114 (H) 09/18/2022   ALT 11 07/20/2023   AST 18 07/20/2023   NA 141 07/20/2023   K 4.4 07/20/2023   CL 104 07/20/2023   CREATININE 1.75 (H) 07/20/2023   BUN 23 07/20/2023   CO2 28 07/20/2023   TSH 0.74 03/19/2023   INR 0.94 12/15/2017   HGBA1C 6.1 07/20/2023    No results found.  Assessment & Plan:   Problem List Items Addressed This Visit     B12 deficiency   On B12      CRF (chronic renal failure)   Seeing Nephrology Cont on Amlodipine , Losartan       Dyslipidemia   Monitor lipids A cardiac CT scan for calcium scoring was offered      Essential hypertension - Primary   Seeing Nephrology Cont on  Amlodipine , Losartan  BP Readings from Last 3 Encounters:  01/22/24 139/84  11/17/23 112/66  10/22/23 116/74         Knee pain, chronic    L>R knee OA- pt saw Dr Irving w/[facility], had knee inj in the L knee, Meloxicam  d/c  Tramadol  prn  Potential benefits of a long term opioids use as well as potential risks (i.e. addiction risk, apnea etc) and complications (i.e. Somnolence, constipation and others) were explained to the patient and were aknowledged. Blue-Emu cream was recommended to use 2-3 times a day      Relevant Medications   traMADol  (ULTRAM ) 50 MG tablet      Meds ordered this encounter  Medications   traMADol  (ULTRAM ) 50 MG tablet    Sig: Take 1 tablet (50 mg total) by mouth every 6 (six) hours as needed.    Dispense:  20 tablet    Refill:  1      Follow-up: Return in about 6 months (around 07/24/2024) for a follow-up visit.  Marolyn Noel, MD

## 2024-01-25 ENCOUNTER — Ambulatory Visit (INDEPENDENT_AMBULATORY_CARE_PROVIDER_SITE_OTHER): Payer: Medicare PPO

## 2024-01-25 ENCOUNTER — Telehealth: Payer: Self-pay

## 2024-01-25 VITALS — Ht 65.0 in | Wt 201.0 lb

## 2024-01-25 DIAGNOSIS — Z1159 Encounter for screening for other viral diseases: Secondary | ICD-10-CM | POA: Diagnosis not present

## 2024-01-25 DIAGNOSIS — Z0000 Encounter for general adult medical examination without abnormal findings: Secondary | ICD-10-CM | POA: Diagnosis not present

## 2024-01-25 NOTE — Telephone Encounter (Signed)
 Copied from CRM [id number]. Topic: Clinical - Medical Advice >> Jan 25, 2024 11:13 AM Harlene ORN wrote: Reason for CRM: Patient had her bloodwork done at the [facility] on 01/14/2024. Does she still need to have her lab work done?  Also needs her Pneumonia shot. Please call back and advise.

## 2024-01-25 NOTE — Patient Instructions (Signed)
 Ms. Benard , Thank you for taking time out of your busy schedule to complete your Annual Wellness Visit with me. I enjoyed our conversation and look forward to speaking with you again next year. I, as well as your care team,  appreciate your ongoing commitment to your health goals. Please review the following plan we discussed and let me know if I can assist you in the future. Your Game plan/ To Do List    Referrals: If you haven't heard from the office you've been referred to, please reach out to them at the phone provided. Ordered a Hepatitis C Screening (lab)  Follow up Visits: We will see or speak with you next year for your Next Medicare AWV with our clinical staff Have you seen your provider in the last 6 months (3 months if uncontrolled diabetes)? No  Clinician Recommendations:  Aim for 30 minutes of exercise or brisk walking, 6-8 glasses of water, and 5 servings of fruits and vegetables each day. Educated and advised on getting the Pneumonia vaccine in 2025.      This is a list of the screenings recommended for you:  Health Maintenance  Topic Date Due   Hepatitis C Screening  Never done   Pneumococcal Vaccine for age over 8 (3 of 3 - PCV20 or PCV21) 12/16/2017   COVID-19 Vaccine (4 - 2024-25 season) 02/22/2023   Flu Shot  01/22/2024   Mammogram  05/07/2024   Medicare Annual Wellness Visit  01/24/2025   DTaP/Tdap/Td vaccine (2 - Tdap) 02/11/2025   DEXA scan (bone density measurement)  Completed   Zoster (Shingles) Vaccine  Completed   Hepatitis B Vaccine  Aged Out   HPV Vaccine  Aged Out   Meningitis B Vaccine  Aged Out   Colon Cancer Screening  Discontinued    Advanced directives: (Copy Requested) Please bring a copy of your health care power of attorney and living will to the office to be added to your chart at your convenience. You can mail to [facility] [address] [place], [place] [address] or email to [email or web address] Advance Care Planning  is important because it:  [x]  Makes sure you receive the medical care that is consistent with your values, goals, and preferences  [x]  It provides guidance to your family and loved ones and reduces their decisional burden about whether or not they are making the right decisions based on your wishes.  Follow the link provided in your after visit summary or read over the paperwork we have mailed to you to help you started getting your Advance Directives in place. If you need assistance in completing these, please reach out to us  so that we can help you!

## 2024-01-25 NOTE — Progress Notes (Addendum)
 Subjective:   Christine Morales is a 78 y.o. who presents for a Medicare Wellness preventive visit.  As a reminder, Annual Wellness Visits don't include a physical exam, and some assessments may be limited, especially if this visit is performed virtually. We may recommend an in-person follow-up visit with your provider if needed.  Visit Complete: Virtual I connected with  Christine Morales on 01/25/24 by a audio enabled telemedicine application and verified that I am speaking with the correct person using two identifiers.  Patient Location: Home  Provider Location: Office/Clinic  I discussed the limitations of evaluation and management by telemedicine. The patient expressed understanding and agreed to proceed.  Vital Signs: Because this visit was a virtual/telehealth visit, some criteria may be missing or patient reported. Any vitals not documented were not able to be obtained and vitals that have been documented are patient reported.  VideoDeclined- This patient declined Librarian, academic. Therefore the visit was completed with audio only.  Persons Participating in Visit: Patient.  AWV Questionnaire: No: Patient Medicare AWV questionnaire was not completed prior to this visit.  Cardiac Risk Factors include: advanced age (>70men, >65 women);dyslipidemia;hypertension;obesity (BMI >30kg/m2)     Objective:    Today's Vitals   01/25/24 1054  Weight: 201 lb (91.2 kg)  Height: 5' 5 (1.651 m)   Body mass index is 33.45 kg/m.     01/25/2024   10:55 AM 01/19/2023   11:46 AM 01/20/2022    9:08 AM 01/06/2021    6:49 PM 09/25/2020   12:41 PM 08/31/2019    7:22 PM 12/15/2017    7:58 AM  Advanced Directives  Does Patient Have a Medical Advance Directive? Yes Yes Yes Yes Yes No Yes   Type of Estate agent of [place];Living will Healthcare Power of [place];Living will Living will;Healthcare Power of [employer] Power of Attorney Living  will;Healthcare Power of [place];Out of facility DNR (pink MOST or yellow form)  Healthcare Power of Attorney  Does patient want to make changes to medical advance directive?   No - Patient declined  No - Patient declined  No - Patient declined   Copy of Healthcare Power of Attorney in Chart? No - copy requested No - copy requested No - copy requested  No - copy requested  No - copy requested   Would patient like information on creating a medical advance directive?      No - Patient declined      Data saved with a previous flowsheet row definition    Current Medications (verified) Outpatient Encounter Medications as of 01/25/2024  Medication Sig   acetaminophen (TYLENOL) 500 MG tablet Take 1,000 mg by mouth 2 (two) times daily as needed for headache.   amLODipine  (NORVASC ) 5 MG tablet TAKE 1 TABLET(5 MG) BY MOUTH DAILY   Aspirin Effervescent (ALKA-SELTZER ORIGINAL PO) Take 1 tablet by mouth daily as needed (body aches).   Cholecalciferol (VITAMIN D ) 50 MCG (2000 UT) tablet Take 1 tablet (2,000 Units total) by mouth daily.   cyanocobalamin  (VITAMIN B12) 1000 MCG tablet Take 1 tablet (1,000 mcg total) by mouth daily.   levothyroxine  (SYNTHROID ) 100 MCG tablet TAKE 1 TABLET BY MOUTH EVERY DAY   losartan  (COZAAR ) 100 MG tablet TAKE 1 TABLET(100 MG) BY MOUTH DAILY   meloxicam  (MOBIC ) 15 MG tablet Take 15 mg by mouth daily.   Multiple Vitamin (MULTIVITAMIN WITH MINERALS) TABS tablet Take 1 tablet by mouth daily. Geritol   cetirizine  (ZYRTEC ) 5 MG  tablet Take 1 tablet (5 mg total) by mouth daily. (Patient not taking: Reported on 01/25/2024)   famotidine  (PEPCID ) 10 MG tablet Take 1 tablet (10 mg total) by mouth at bedtime. (Patient not taking: Reported on 01/25/2024)   ferrous sulfate  325 (65 FE) MG tablet Take 1 tablet (325 mg total) by mouth daily. (Patient not taking: Reported on 01/25/2024)   traMADol  (ULTRAM ) 50 MG tablet Take 1 tablet (50 mg total) by mouth every 6 (six) hours as needed. (Patient not  taking: Reported on 01/25/2024)   triamcinolone  cream (KENALOG ) 0.1 % Apply 1 Application topically 2 (two) times daily. (Patient not taking: Reported on 01/25/2024)   Facility-Administered Encounter Medications as of 01/25/2024  Medication   heparin  lock flush 100 unit/mL   sodium chloride  0.9 % injection 10 mL   sodium chloride  0.9 % injection 10 mL    Allergies (verified) Patient has no known allergies.   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 type   Heart disease Sister    Diabetes Brother    Hypertension Other        FH   Cholelithiasis Neg Hx    Colon cancer Neg Hx    Colon polyps Neg Hx    Esophageal cancer Neg Hx    Stomach cancer Neg Hx    Rectal cancer Neg Hx    Social History   Socioeconomic History   Marital status: Widowed    Spouse name: Not on file   Number of children: 2   Years of education: Not on file   Highest education level: Not on file  Occupational History   Occupation: retired    Associate Professor: [employer]  Tobacco Use   Smoking status: Never   Smokeless tobacco: Never  Vaping Use   Vaping status: Never Used  Substance and Sexual Activity   Alcohol use: No    Alcohol/week: 0.0 standard drinks of alcohol   Drug use: No    Sexual activity: Not Currently    Comment: intercourse age 12, less than 5 sexual partners, des neg  Other Topics Concern   Not on file  Social History Narrative   Live with spouse; have 2 children;   Lives in the area   [place] children x 5;    Social Drivers of Health   Financial Resource Strain: Low Risk  (01/25/2024)   Overall Financial Resource Strain (CARDIA)    Difficulty of Paying Living Expenses: Not hard at all  Food Insecurity:  No Food Insecurity (01/25/2024)   Hunger Vital Sign    Worried About Running Out of Food in the Last Year: Never true    Ran Out of Food in the Last Year: Never true  Transportation Needs: No Transportation Needs (01/25/2024)   PRAPARE - Administrator, Civil Service (Medical): No    Lack of Transportation (Non-Medical): No  Physical Activity: Insufficiently Active (01/25/2024)   Exercise Vital Sign    Days of Exercise per Week: 4 days    Minutes of Exercise per Session: 30 min  Stress: No Stress Concern Present (01/25/2024)   [employer] of Occupational Health - Occupational Stress Questionnaire    Feeling of Stress: Not at all  Social Connections: Moderately Integrated (01/25/2024)   Social Connection and Isolation Panel    Frequency of Communication with Friends and Family: More than three times a week    Frequency of Social Gatherings with Friends and Family: More than three times a week    Attends Religious Services: More than 4 times per year    Active Member of [employer] or Organizations: Yes    Attends Banker Meetings: 1 to 4 times per year    Marital Status: Widowed    Tobacco Counseling Counseling given: No    Clinical Intake:  Pre-visit preparation completed: Yes  Pain : No/denies pain     BMI - recorded: 33.45 Nutritional Status: BMI > 30  Obese Nutritional Risks: None Diabetes: No  Lab Results  Component Value Date   HGBA1C 6.1 07/20/2023   HGBA1C 6.1 03/19/2023   HGBA1C 5.8 (H) 01/16/2020      How often do you need to have someone help you when you read instructions, pamphlets, or other written materials from your doctor or pharmacy?: 1 - Never  Interpreter Needed?: No  Information entered by :: Verdie Saba, CMA   Activities of Daily Living     01/25/2024   10:58 AM  In your present state of health, do you have any difficulty performing the following activities:  Hearing? 0  Vision? 0  Difficulty concentrating or making decisions? 0  Walking or climbing stairs? 0  Dressing or bathing? 0  Doing errands, shopping? 0  Preparing Food and eating ? N  Using the Toilet? N  In the past six months, have you accidently leaked urine? N  Do you have problems with loss of bowel control? N  Managing your Medications? N  Managing your Finances? N  Housekeeping or managing your Housekeeping? N    Patient Care Team: Plotnikov, Karlynn GAILS, MD as PCP - General (Internal Medicine) Amadeo Windell SAILOR, MD (Inactive) (Hematology and Oncology) Vernetta Berg, MD (General Surgery) Abran Norleen SAILOR, MD as Referring Physician (Gastroenterology) Verta Royden DASEN, [place] as Consulting Physician (Podiatry) Irving Delinda ORN, MD as Consulting Physician (Sports Medicine) Octavia, Charlie Hamilton, MD as Consulting Physician (Ophthalmology)  I have updated your Care Teams any recent Medical Services you may have received from other providers in the past year.     Assessment:   This is a routine wellness examination for Christine Morales.  Hearing/Vision screen Hearing Screening - Comments:: Denies hearing difficulties   Vision Screening - Comments:: Wears rx glasses - up to date with routine eye exams with Dr Octavia   Goals Addressed               This Visit's Progress     Patient Stated (pt-stated)        Patient stated she  plans to continue exercising 4-5 days a week       Depression Screen     01/25/2024   11:00 AM 10/01/2023    1:35 PM 07/20/2023    8:07 AM 01/19/2023   11:44 AM 09/18/2022     8:20 AM 01/20/2022    8:41 AM 08/16/2021    8:02 AM  PHQ 2/9 Scores  PHQ - 2 Score 0 0 0 0 0 0 0  PHQ- 9 Score 0          Fall Risk     01/25/2024   10:59 AM 10/22/2023    9:02 AM 10/01/2023    1:35 PM 07/20/2023    8:06 AM 01/19/2023   11:47 AM  Fall Risk   Falls in the past year? 1 1 1 1  0  Number falls in past yr: 0 0 0 0 0  Comment 1      Injury with Fall? 0 0 0 0 0  Risk for fall due to :  No Fall Risks No Fall Risks History of fall(s) Impaired vision  Follow up Falls evaluation completed;Falls prevention discussed Falls evaluation completed Falls evaluation completed Falls evaluation completed Falls prevention discussed    MEDICARE RISK AT HOME:  Medicare Risk at Home Any stairs in or around the home?: Yes (outside) If so, are there any without handrails?: No Home free of loose throw rugs in walkways, pet beds, electrical cords, etc?: Yes Adequate lighting in your home to reduce risk of falls?: Yes Life alert?: No Use of a cane, walker or w/c?: No Grab bars in the bathroom?: No Shower chair or bench in shower?: No Elevated toilet seat or a handicapped toilet?: No  TIMED UP AND GO:  Was the test performed?  No  Cognitive Function: 6CIT completed    01/30/2015    9:41 AM  MMSE - Mini Mental State Exam  Not completed: Unable to complete        01/25/2024   11:02 AM 01/19/2023   11:48 AM 01/20/2022    9:12 AM  6CIT Screen  What Year? 0 points 0 points 0 points  What month? 0 points 0 points 0 points  What time? 0 points 0 points 0 points  Count back from 20 0 points 0 points 0 points  Months in reverse 0 points 0 points 0 points  Repeat phrase 0 points 0 points 0 points  Total Score 0 points 0 points 0 points    Immunizations Immunization History  Administered Date(s) Administered   Fluad Quad(high Dose 65+) 03/23/2020   Influenza, High Dose Seasonal PF 04/16/2015, 02/27/2016, 02/27/2016, 03/30/2017, 03/20/2019, 03/23/2021, 03/14/2022   Influenza,inj,Quad PF,6+  Mos 03/16/2014, 04/05/2018   Influenza-Unspecified 04/20/2023   PFIZER(Purple Top)SARS-COV-2 Vaccination 08/19/2019, 09/13/2019, 04/21/2020   Pneumococcal Conjugate-13 10/21/2017   Pneumococcal Polysaccharide-23 09/06/2012   Td 02/12/2015   Zoster Recombinant(Shingrix) 11/11/2021, 01/20/2022    Screening Tests Health Maintenance  Topic Date Due   Hepatitis C Screening  Never done   Pneumococcal Vaccine: 50+ Years (3 of 3 - PCV20 or PCV21) 12/16/2017   COVID-19 Vaccine (4 - 2024-25 season) 02/22/2023   INFLUENZA VACCINE  01/22/2024   MAMMOGRAM  05/07/2024   Medicare Annual Wellness (AWV)  01/24/2025   DTaP/Tdap/Td (2 - Tdap) 02/11/2025   DEXA SCAN  Completed   Zoster Vaccines- Shingrix  Completed   Hepatitis B Vaccines  Aged Out   HPV VACCINES  Aged Out   Meningococcal B Vaccine  Aged Out  Colonoscopy  Discontinued    Health Maintenance  Health Maintenance Due  Topic Date Due   Hepatitis C Screening  Never done   Pneumococcal Vaccine: 50+ Years (3 of 3 - PCV20 or PCV21) 12/16/2017   COVID-19 Vaccine (4 - 2024-25 season) 02/22/2023   INFLUENZA VACCINE  01/22/2024   Health Maintenance Items Addressed:  Labs Ordered: Hepatitis C Screening ; Patient stated will call the office to schedule a nurse appt for a Pneumonia vaccine within 2025.  Additional Screening:  Vision Screening: Recommended annual ophthalmology exams for early detection of glaucoma and other disorders of the eye. Would you like a referral to an eye doctor? No    Dental Screening: Recommended annual dental exams for proper oral hygiene  Community Resource Referral / Chronic Care Management: CRR required this visit?  No   CCM required this visit?  No   Plan:    I have personally reviewed and noted the following in the patient's chart:   Medical and social history Use of alcohol, tobacco or illicit drugs  Current medications and supplements including opioid prescriptions. Patient is not currently  taking opioid prescriptions. Functional ability and status Nutritional status Physical activity Advanced directives List of other physicians Hospitalizations, surgeries, and ER visits in previous 12 months Vitals Screenings to include cognitive, depression, and falls Referrals and appointments  In addition, I have reviewed and discussed with patient certain preventive protocols, quality metrics, and best practice recommendations. A written personalized care plan for preventive services as well as general preventive health recommendations were provided to patient.   Verdie CHRISTELLA Saba, CMA   01/25/2024   After Visit Summary: (Declined) Due to this being a telephonic visit, with patients personalized plan was offered to patient but patient Declined AVS at this time   Notes: Nothing significant to report at this time.  Medical screening examination/treatment/procedure(s) were performed by non-physician practitioner and as supervising physician I was immediately available for consultation/collaboration.  I agree with above. Karlynn Noel, MD

## 2024-01-28 NOTE — Telephone Encounter (Signed)
 No need for more blood work. Okay to get Prevnar 20. Thank you

## 2024-01-29 NOTE — Telephone Encounter (Signed)
 LDVM for pt informing her of Providers advice as follows No need for more blood work. Okay to get Prevnar 20. Thank you

## 2024-03-18 DIAGNOSIS — N184 Chronic kidney disease, stage 4 (severe): Secondary | ICD-10-CM | POA: Diagnosis not present

## 2024-03-19 ENCOUNTER — Emergency Department (HOSPITAL_COMMUNITY)

## 2024-03-19 ENCOUNTER — Encounter (HOSPITAL_COMMUNITY): Payer: Self-pay | Admitting: Emergency Medicine

## 2024-03-19 ENCOUNTER — Emergency Department (HOSPITAL_COMMUNITY)
Admission: EM | Admit: 2024-03-19 | Discharge: 2024-03-19 | Disposition: A | Discharge status: Home / Self Care | Attending: Emergency Medicine | Admitting: Emergency Medicine

## 2024-03-19 ENCOUNTER — Other Ambulatory Visit: Payer: Self-pay

## 2024-03-19 ENCOUNTER — Emergency Department (HOSPITAL_COMMUNITY)
Admission: EM | Admit: 2024-03-19 | Discharge: 2024-03-19 | Disposition: A | Discharge status: Home / Self Care | Source: Home / Self Care | Attending: Emergency Medicine | Admitting: Emergency Medicine

## 2024-03-19 DIAGNOSIS — Z79899 Other long term (current) drug therapy: Secondary | ICD-10-CM | POA: Diagnosis not present

## 2024-03-19 DIAGNOSIS — E039 Hypothyroidism, unspecified: Secondary | ICD-10-CM | POA: Insufficient documentation

## 2024-03-19 DIAGNOSIS — N390 Urinary tract infection, site not specified: Secondary | ICD-10-CM | POA: Diagnosis not present

## 2024-03-19 DIAGNOSIS — R509 Fever, unspecified: Secondary | ICD-10-CM | POA: Diagnosis not present

## 2024-03-19 DIAGNOSIS — R55 Syncope and collapse: Secondary | ICD-10-CM | POA: Insufficient documentation

## 2024-03-19 DIAGNOSIS — I129 Hypertensive chronic kidney disease with stage 1 through stage 4 chronic kidney disease, or unspecified chronic kidney disease: Secondary | ICD-10-CM | POA: Insufficient documentation

## 2024-03-19 DIAGNOSIS — Z8572 Personal history of non-Hodgkin lymphomas: Secondary | ICD-10-CM | POA: Diagnosis not present

## 2024-03-19 DIAGNOSIS — R531 Weakness: Secondary | ICD-10-CM | POA: Diagnosis not present

## 2024-03-19 DIAGNOSIS — N189 Chronic kidney disease, unspecified: Secondary | ICD-10-CM | POA: Diagnosis not present

## 2024-03-19 DIAGNOSIS — Z8616 Personal history of COVID-19: Secondary | ICD-10-CM | POA: Diagnosis not present

## 2024-03-19 LAB — URINALYSIS, W/ REFLEX TO CULTURE (INFECTION SUSPECTED)
Bilirubin Urine: NEGATIVE
Glucose, UA: NEGATIVE mg/dL
Ketones, ur: NEGATIVE mg/dL
Nitrite: NEGATIVE
Protein, ur: NEGATIVE mg/dL
Specific Gravity, Urine: 1.009 (ref 1.005–1.030)
pH: 7 (ref 5.0–8.0)

## 2024-03-19 LAB — COMPREHENSIVE METABOLIC PANEL WITH GFR
ALT: 8 U/L (ref 0–44)
AST: 19 U/L (ref 15–41)
Albumin: 3.9 g/dL (ref 3.5–5.0)
Alkaline Phosphatase: 74 U/L (ref 38–126)
Anion gap: 13 (ref 5–15)
BUN: 28 mg/dL — ABNORMAL HIGH (ref 8–23)
CO2: 22 mmol/L (ref 22–32)
Calcium: 9.8 mg/dL (ref 8.9–10.3)
Chloride: 106 mmol/L (ref 98–111)
Creatinine, Ser: 1.83 mg/dL — ABNORMAL HIGH (ref 0.44–1.00)
GFR, Estimated: 28 mL/min — ABNORMAL LOW (ref >60)
Glucose, Bld: 100 mg/dL — ABNORMAL HIGH (ref 70–99)
Potassium: 4.1 mmol/L (ref 3.5–5.1)
Sodium: 141 mmol/L (ref 135–145)
Total Bilirubin: 0.9 mg/dL (ref 0.0–1.2)
Total Protein: 6.3 g/dL — ABNORMAL LOW (ref 6.5–8.1)

## 2024-03-19 LAB — CBC WITH DIFFERENTIAL/PLATELET
Abs Immature Granulocytes: 0.01 K/uL (ref 0.00–0.07)
Abs Immature Granulocytes: 0.05 K/uL (ref 0.00–0.07)
Basophils Absolute: 0 K/uL (ref 0.0–0.1)
Basophils Absolute: 0 K/uL (ref 0.0–0.1)
Basophils Relative: 0 %
Basophils Relative: 0 %
Eosinophils Absolute: 0.1 K/uL (ref 0.0–0.5)
Eosinophils Absolute: 0 K/uL (ref 0.0–0.5)
Eosinophils Relative: 1 %
Eosinophils Relative: 0 %
HCT: 37.5 % (ref 36.0–46.0)
HCT: 40.9 % (ref 36.0–46.0)
Hemoglobin: 11.9 g/dL — ABNORMAL LOW (ref 12.0–15.0)
Hemoglobin: 13 g/dL (ref 12.0–15.0)
Immature Granulocytes: 0 %
Immature Granulocytes: 1 %
Lymphocytes Relative: 9 %
Lymphocytes Relative: 4 %
Lymphs Abs: 0.6 K/uL — ABNORMAL LOW (ref 0.7–4.0)
Lymphs Abs: 0.4 K/uL — ABNORMAL LOW (ref 0.7–4.0)
MCH: 27.8 pg (ref 26.0–34.0)
MCH: 28.3 pg (ref 26.0–34.0)
MCHC: 31.7 g/dL (ref 30.0–36.0)
MCHC: 31.8 g/dL (ref 30.0–36.0)
MCV: 87.6 fL (ref 80.0–100.0)
MCV: 88.9 fL (ref 80.0–100.0)
Monocytes Absolute: 0.1 K/uL (ref 0.1–1.0)
Monocytes Absolute: 0.5 K/uL (ref 0.1–1.0)
Monocytes Relative: 2 %
Monocytes Relative: 5 %
Neutro Abs: 5.9 K/uL (ref 1.7–7.7)
Neutro Abs: 9.1 K/uL — ABNORMAL HIGH (ref 1.7–7.7)
Neutrophils Relative %: 88 %
Neutrophils Relative %: 90 %
Platelets: 217 K/uL (ref 150–400)
Platelets: 199 K/uL (ref 150–400)
RBC: 4.28 MIL/uL (ref 3.87–5.11)
RBC: 4.6 MIL/uL (ref 3.87–5.11)
RDW: 14.1 % (ref 11.5–15.5)
RDW: 14.2 % (ref 11.5–15.5)
WBC: 6.7 K/uL (ref 4.0–10.5)
WBC: 10.1 K/uL (ref 4.0–10.5)
nRBC: 0 % (ref 0.0–0.2)
nRBC: 0 % (ref 0.0–0.2)

## 2024-03-19 LAB — BASIC METABOLIC PANEL WITH GFR
Anion gap: 13 (ref 5–15)
BUN: 26 mg/dL — ABNORMAL HIGH (ref 8–23)
CO2: 23 mmol/L (ref 22–32)
Calcium: 10.2 mg/dL (ref 8.9–10.3)
Chloride: 105 mmol/L (ref 98–111)
Creatinine, Ser: 1.95 mg/dL — ABNORMAL HIGH (ref 0.44–1.00)
GFR, Estimated: 26 mL/min — ABNORMAL LOW (ref >60)
Glucose, Bld: 120 mg/dL — ABNORMAL HIGH (ref 70–99)
Potassium: 4.5 mmol/L (ref 3.5–5.1)
Sodium: 140 mmol/L (ref 135–145)

## 2024-03-19 LAB — RESP PANEL BY RT-PCR (RSV, FLU A&B, COVID)  RVPGX2
Influenza A by PCR: NEGATIVE
Influenza B by PCR: NEGATIVE
Resp Syncytial Virus by PCR: NEGATIVE
SARS Coronavirus 2 by RT PCR: NEGATIVE

## 2024-03-19 LAB — TROPONIN T, HIGH SENSITIVITY
Troponin T High Sensitivity: 26 ng/L — ABNORMAL HIGH (ref 0–19)
Troponin T High Sensitivity: 22 ng/L — ABNORMAL HIGH (ref 0–19)

## 2024-03-19 MED ORDER — SODIUM CHLORIDE 0.9 % IV BOLUS
1000.0000 mL | Freq: Once | INTRAVENOUS | Status: AC
  Administered 2024-03-19: 1000 mL via INTRAVENOUS

## 2024-03-19 MED ORDER — CEPHALEXIN 500 MG PO CAPS
500.0000 mg | ORAL_CAPSULE | Freq: Once | ORAL | Status: AC
  Administered 2024-03-19: 500 mg via ORAL
  Filled 2024-03-19: qty 1

## 2024-03-19 MED ORDER — CEPHALEXIN 500 MG PO CAPS: 500.0000 mg | ORAL_CAPSULE | Freq: Three times a day (TID) | ORAL | 0 refills | Status: AC

## 2024-03-19 MED ORDER — ONDANSETRON 4 MG PO TBDP
4.0000 mg | ORAL_TABLET | Freq: Three times a day (TID) | ORAL | 0 refills | Status: AC | PRN
Start: 2024-03-19 — End: 2024-03-22

## 2024-03-19 NOTE — ED Notes (Signed)
 Pt ambulated to bathroom

## 2024-03-19 NOTE — Discharge Instructions (Signed)
 Return for any problem.   Take antibiotics as previously prescribed.

## 2024-03-19 NOTE — ED Triage Notes (Signed)
 Pt arrives w/ [facility] from home w/ c/o chills & fever x 2 hours. Denies any other c/o  Temp w/ EMS 100.7. 1000mg  tylenol given PTA  VSS

## 2024-03-19 NOTE — ED Provider Notes (Signed)
 [facility] [place] EMERGENCY DEPARTMENT AT [facility] Provider Note   CSN: [id number] Arrival date & time: 03/19/24  9183     Patient presents with: Urinary Tract Infection and Fatigue   Christine Morales is a 78 y.o. female.   78 year old female with prior medical history as detailed below presents for evaluation.  Patient was seen earlier this morning for similar complaint.  She was diagnosed with UTI.  She felt improved after evaluation.  She went home.  Over the course of the approximate hour that she was at home she had a near syncopal episode.  She reports that she was in her kitchen.  She felt weak.  She had to sit down.  She did not pass out.  She denies chest pain or shortness of breath.  She reports that she feels better now.  She denies nausea or vomiting.  She denies pain.  The history is provided by the patient and medical records.       Prior to Admission medications   Medication Sig Start Date End Date Taking? Authorizing Provider  acetaminophen (TYLENOL) 500 MG tablet Take 1,000 mg by mouth 2 (two) times daily as needed for headache.    [provider]  amLODipine  (NORVASC ) 5 MG tablet TAKE 1 TABLET(5 MG) BY MOUTH DAILY 11/17/23   Plotnikov, Aleksei V, MD  Aspirin Effervescent (ALKA-SELTZER ORIGINAL PO) Take 1 tablet by mouth daily as needed (body aches).    [provider]  cephALEXin (KEFLEX) 500 MG capsule Take 1 capsule (500 mg total) by mouth 3 (three) times daily for 7 days. 03/19/24 03/26/24  Trine Raynell Moder, MD  cetirizine  (ZYRTEC ) 5 MG tablet Take 1 tablet (5 mg total) by mouth daily. Patient not taking: Reported on 01/25/2024 03/27/23   Raspet, Erin K, PA-C  Cholecalciferol (VITAMIN D ) 50 MCG (2000 UT) tablet Take 1 tablet (2,000 Units total) by mouth daily. 09/18/22   Plotnikov, Aleksei V, MD  cyanocobalamin  (VITAMIN B12) 1000 MCG tablet Take 1 tablet (1,000 mcg total) by mouth daily. 09/18/22   Plotnikov, Karlynn GAILS, MD  famotidine  (PEPCID )  10 MG tablet Take 1 tablet (10 mg total) by mouth at bedtime. Patient not taking: Reported on 01/25/2024 03/27/23   Raspet, Erin K, PA-C  ferrous sulfate  325 (65 FE) MG tablet Take 1 tablet (325 mg total) by mouth daily. Patient not taking: Reported on 01/25/2024 09/18/22   Plotnikov, Karlynn GAILS, MD  levothyroxine  (SYNTHROID ) 100 MCG tablet TAKE 1 TABLET BY MOUTH EVERY DAY 10/05/23   Plotnikov, Aleksei V, MD  losartan  (COZAAR ) 100 MG tablet TAKE 1 TABLET(100 MG) BY MOUTH DAILY 10/01/23   Webb, Padonda B, FNP  meloxicam  (MOBIC ) 15 MG tablet Take 15 mg by mouth daily.    [provider]  Multiple Vitamin (MULTIVITAMIN WITH MINERALS) TABS tablet Take 1 tablet by mouth daily. Geritol    [provider]  ondansetron  (ZOFRAN -ODT) 4 MG disintegrating tablet Take 1 tablet (4 mg total) by mouth every 8 (eight) hours as needed for up to 3 days for nausea or vomiting. 03/19/24 03/22/24  Cardama, Raynell Moder, MD  traMADol  (ULTRAM ) 50 MG tablet Take 1 tablet (50 mg total) by mouth every 6 (six) hours as needed. Patient not taking: Reported on 01/25/2024 01/22/24   Plotnikov, Karlynn GAILS, MD  triamcinolone  cream (KENALOG ) 0.1 % Apply 1 Application topically 2 (two) times daily. Patient not taking: Reported on 01/25/2024 03/27/23   Raspet, Erin K, PA-C    Allergies: Patient has no  known allergies.    Review of Systems  All other systems reviewed and are negative.   Updated Vital Signs SpO2 96%   Physical Exam Vitals and nursing note reviewed.  Constitutional:      General: She is not in acute distress.    Appearance: Normal appearance. She is well-developed.  HENT:     Head: Normocephalic and atraumatic.  Eyes:     Conjunctiva/sclera: Conjunctivae normal.     Pupils: Pupils are equal, round, and reactive to light.  Cardiovascular:     Rate and Rhythm: Normal rate and regular rhythm.     Heart sounds: Normal heart sounds.  Pulmonary:     Effort: Pulmonary effort is normal. No respiratory  distress.     Breath sounds: Normal breath sounds.  Abdominal:     General: There is no distension.     Palpations: Abdomen is soft.     Tenderness: There is no abdominal tenderness.  Musculoskeletal:        General: No deformity. Normal range of motion.     Cervical back: Normal range of motion and neck supple.  Skin:    General: Skin is warm and dry.  Neurological:     General: No focal deficit present.     Mental Status: She is alert and oriented to person, place, and time.     (all labs ordered are listed, but only abnormal results are displayed) Labs Reviewed  BASIC METABOLIC PANEL WITH GFR  CBC WITH DIFFERENTIAL/PLATELET  TROPONIN T, HIGH SENSITIVITY    EKG: None  Radiology: [facility] Chest Port 1 View Result Date: 03/19/2024 CLINICAL DATA:  78 year old female with sudden onset fever and chills. EXAM: PORTABLE CHEST 1 VIEW COMPARISON:  Portable chest 01/06/2021 and earlier. FINDINGS: Portable AP semi upright view at 0522 hours. Chronic double density at the central diaphragm, lower mediastinum suggesting a gastric hiatal hernia. This appears moderate and stable since 2022. Other mediastinal contours are within normal limits. Visualized tracheal air column is within normal limits. Lung volumes are within normal limits. Allowing for portable technique the lungs are clear. No pneumothorax or pleural effusion. No acute osseous abnormality identified. IMPRESSION: 1. No acute cardiopulmonary abnormality. 2. Moderate chronic gastric hiatal hernia suspected. Electronically Signed   By: VEAR Hurst M.D.   On: 03/19/2024 05:56     Procedures   Medications Ordered in the ED  sodium chloride  0.9 % bolus 1,000 mL (has no administration in time range)                                    Medical Decision Making Patient returned shortly after evaluation earlier today.  She had a near syncopal event after returning home.  On reevaluation she appears to be in no distress.  Patient given IV  fluids.  Patient reports she feels much improved after IV fluids.  Repeat labs are without acute abnormality.  Patient offered admission.  She declined same.  She wants to go home.  Reports possible upper stress.  Strict return precautions given understood.  Amount and/or Complexity of Data Reviewed Labs: ordered.        Final diagnoses:  Near syncope    ED Discharge Orders     None          Laurice Maude BROCKS, MD 03/19/24 1315

## 2024-03-19 NOTE — ED Provider Notes (Signed)
 [facility] AT [facility] Provider Note  CSN: [id number] Arrival date & time: 03/19/24 0431  Chief Complaint(s) Fever  HPI Christine Morales is a 78 y.o. female with a past medical history listed below who presents to the emergency department with low-grade fever and chills that began 2 hours prior to arrival.  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  She denies any URI symptoms.  No coughing or congestion.  No otalgia.  No sore throat.  No chest pain or shortness of breath.  No abdominal pain.  No nausea or vomiting.  No diarrhea.  No dysuria or urinary symptoms.  Temperature with EMS was at 100.7.  Given Tylenol in route.  The history is provided by the patient.    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      Rhinitis, allergic    Thyroid  disease    Ulcer    peptic   Patient Active Problem List   Diagnosis Date Noted   Knee pain, chronic 11/17/2023   Bilateral carotid bruits 07/20/2023   URI (upper respiratory infection) 03/19/2023   Strain of calf muscle, sequela 12/16/2022   Laryngitis 03/20/2022   Eczema 10/30/2021   COVID-19 01/25/2021   Cough 01/25/2021   Hand swelling 11/05/2020   Renal cyst 09/18/2020   Fatty liver 09/18/2020   Dyslipidemia 01/17/2020   Elevated glucose 01/16/2020   History of total abdominal hysterectomy 12/21/2019   Arthralgia of foot, left 12/23/2018   Vertigo 10/21/2017   Morbid obesity (HCC) 10/21/2017   Acute bronchitis 07/05/2015   Traumatic hematoma of foot 02/12/2015   Hypothyroidism 09/13/2013   Abdominal pain, left lower quadrant 09/07/2012    Well adult exam 03/08/2012   CRF (chronic renal failure) 09/03/2011   B12 deficiency 11/22/2010   THRUSH 03/14/2010   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 Authorizing Provider  cephALEXin (KEFLEX) 500 MG capsule Take 1 capsule (500 mg total) by mouth 3 (three) times daily for 7 days. 03/19/24 03/26/24 Yes Layden Caterino, Raynell Moder, MD  ondansetron  (ZOFRAN -ODT) 4 MG disintegrating tablet Take 1 tablet (4 mg total) by mouth every 8 (eight) hours as needed for up to 3 days for nausea or vomiting. 03/19/24 03/22/24 Yes Caty Tessler, Raynell Moder, MD  acetaminophen (TYLENOL) 500 MG tablet Take 1,000 mg by mouth 2 (two) times daily as needed for headache.    [provider]  amLODipine  (NORVASC ) 5 MG tablet TAKE 1 TABLET(5 MG) BY MOUTH DAILY 11/17/23   Plotnikov, Aleksei V, MD  Aspirin Effervescent (ALKA-SELTZER  ORIGINAL PO) Take 1 tablet by mouth daily as needed (body aches).    [provider]  cetirizine  (ZYRTEC ) 5 MG tablet Take 1 tablet (5 mg total) by mouth daily. Patient not taking: Reported on 01/25/2024 03/27/23   Raspet, Erin K, PA-C  Cholecalciferol (VITAMIN D ) 50 MCG (2000 UT) tablet Take 1 tablet (2,000 Units total) by mouth daily. 09/18/22   Plotnikov, Aleksei V, MD  cyanocobalamin  (VITAMIN B12) 1000 MCG tablet Take 1 tablet (1,000 mcg total) by mouth daily. 09/18/22   Plotnikov, Karlynn GAILS, MD  famotidine  (PEPCID ) 10 MG tablet Take 1  tablet (10 mg total) by mouth at bedtime. Patient not taking: Reported on 01/25/2024 03/27/23   Raspet, Rocky POUR, PA-C  ferrous sulfate  325 (65 FE) MG tablet Take 1 tablet (325 mg total) by mouth daily. Patient not taking: Reported on 01/25/2024 09/18/22   Plotnikov, Aleksei V, MD  levothyroxine  (SYNTHROID ) 100 MCG tablet TAKE 1 TABLET BY MOUTH EVERY DAY 10/05/23   Plotnikov, Aleksei V, MD  losartan  (COZAAR ) 100 MG tablet TAKE 1 TABLET(100 MG) BY MOUTH DAILY 10/01/23   Webb, Padonda B, FNP  meloxicam  (MOBIC ) 15 MG tablet Take 15 mg by mouth daily.    [provider]  Multiple Vitamin (MULTIVITAMIN WITH MINERALS) TABS tablet Take 1 tablet by mouth daily. Geritol    [provider]  traMADol  (ULTRAM ) 50 MG tablet Take 1 tablet (50 mg total) by mouth every 6 (six) hours as needed. Patient not taking: Reported on 01/25/2024 01/22/24   Plotnikov, Karlynn GAILS, MD  triamcinolone  cream (KENALOG ) 0.1 % Apply 1 Application topically 2 (two) times daily. Patient not taking: Reported on 01/25/2024 03/27/23   Raspet, Erin K, PA-C                                                                                                                                    Allergies Patient has no known allergies.  Review of Systems Review of Systems As noted in HPI  Physical Exam Vital Signs  I have reviewed the triage vital signs BP 129/64   Pulse 82   Temp 98.5 F (36.9 C) (Oral)   Resp 12   SpO2 94%   Physical Exam Vitals reviewed.  Constitutional:      General: She is not in acute distress.    Appearance: She is well-developed. She is not diaphoretic.  HENT:     Head: Normocephalic and atraumatic.     Right Ear: Tympanic membrane normal.     Left Ear: Tympanic membrane normal.     Nose: Nose normal.     Mouth/Throat:     Pharynx: Oropharynx is clear.  Eyes:     General: No scleral icterus.       Right eye: No discharge.        Left eye: No discharge.     Conjunctiva/sclera: Conjunctivae normal.      Pupils: Pupils  are equal, round, and reactive to light.  Cardiovascular:     Rate and Rhythm: Normal rate and regular rhythm.     Heart sounds: No murmur heard.    No friction rub. No gallop.  Pulmonary:     Effort: Pulmonary effort is normal. No respiratory distress.     Breath sounds: Normal breath sounds. No stridor. No rales.  Abdominal:     General: There is no distension.     Palpations: Abdomen is soft.     Tenderness: There is no abdominal tenderness.  Musculoskeletal:        General: No tenderness.     Cervical back: Normal range of motion and neck supple.  Skin:    General: Skin is warm and dry.     Findings: No erythema or rash.  Neurological:     Mental Status: She is alert and oriented to person, place, and time.     ED Results and Treatments Labs (all labs ordered are listed, but only abnormal results are displayed) Labs Reviewed  CBC WITH DIFFERENTIAL/PLATELET - Abnormal; Notable for the following components:      Result Value   Hemoglobin 11.9 (*)    Lymphs Abs 0.6 (*)    All other components within normal limits  COMPREHENSIVE METABOLIC PANEL WITH GFR - Abnormal; Notable for the following components:   Glucose, Bld 100 (*)    BUN 28 (*)    Creatinine, Ser 1.83 (*)    Total Protein 6.3 (*)    GFR, Estimated 28 (*)    All other components within normal limits  URINALYSIS, W/ REFLEX TO CULTURE (INFECTION SUSPECTED) - Abnormal; Notable for the following components:   Color, Urine STRAW (*)    Hgb urine dipstick SMALL (*)    Leukocytes,Ua MODERATE (*)    Bacteria, UA MANY (*)    All other components within normal limits  RESP PANEL BY RT-PCR (RSV, FLU A&B, COVID)  [id number]  URINE CULTURE                                                                                                                         EKG  EKG Interpretation Date/Time:    Ventricular Rate:    PR Interval:    QRS Duration:    QT Interval:    QTC Calculation:   R Axis:       Text Interpretation:         Radiology DG Chest Port 1 View Result Date: 03/19/2024 CLINICAL DATA:  78 year old female with sudden onset fever and chills. EXAM: PORTABLE CHEST 1 VIEW COMPARISON:  Portable chest 01/06/2021 and earlier. FINDINGS: Portable AP semi upright view at 0522 hours. Chronic double density at the central diaphragm, lower mediastinum suggesting a gastric hiatal hernia. This appears moderate and stable since 2022. Other mediastinal contours are within normal limits. Visualized tracheal air column is within normal limits. Lung volumes are within normal limits. Allowing for portable technique the lungs are clear. No  pneumothorax or pleural effusion. No acute osseous abnormality identified. IMPRESSION: 1. No acute cardiopulmonary abnormality. 2. Moderate chronic gastric hiatal hernia suspected. Electronically Signed   By: VEAR Hurst M.D.   On: 03/19/2024 05:56    Medications Ordered in ED Medications  cephALEXin (KEFLEX) capsule 500 mg (has no administration in time range)   Procedures Procedures  (including critical care time) Medical Decision Making / ED Course   Medical Decision Making Amount and/or Complexity of Data Reviewed Labs: ordered. Decision-making details documented in ED Course. Radiology: ordered. Decision-making details documented in ED Course.  Risk Prescription drug management.    Low-grade fever and chills. Exam not concerning for otitis media, pharyngitis.  Lungs good auscultation bilaterally.  Clinical Course as of 03/19/24 0649  Sat Mar 19, 2024  0626 CBC with Differential(!) Without leukocytosis.  Mild anemia. [PC]  0627 Comprehensive metabolic panel(!) No significant electrolyte derangements.  Mild renal insufficiency without overt AKI with function close to her baseline.  No evidence of bili obstruction.  [PC]  0629 DG Chest Port 1 View No evidence of pneumonia [PC]  0646 Resp panel by RT-PCR (RSV, Flu A&B, Covid) Anterior Nasal  Swab To assess for COVID, influenza, RSV is negative. [PC]  [id number] Urinalysis, w/ Reflex to Culture (Infection Suspected) -Urine, Clean Catch(!) Consistent with a urinary tract infection.  Will treat with oral antibiotics. [PC]    Clinical Course User Index [PC] Jontae Sonier, Raynell Moder, MD    Final Clinical Impression(s) / ED Diagnoses Final diagnoses:  Lower urinary tract infectious disease   The patient appears reasonably screened and/or stabilized for discharge and I doubt any other medical condition or other [facility] requiring further screening, evaluation, or treatment in the ED at this time. I have discussed the findings, Dx and Tx plan with the patient/family who expressed understanding and agree(s) with the plan. Discharge instructions discussed at length. The patient/family was given strict return precautions who verbalized understanding of the instructions. No further questions at time of discharge.  Disposition: Discharge  Condition: Good  ED Discharge Orders          Ordered    cephALEXin (KEFLEX) 500 MG capsule  3 times daily        03/19/24 0648    ondansetron  (ZOFRAN -ODT) 4 MG disintegrating tablet  Every 8 hours PRN        03/19/24 9351             Follow Up: Plotnikov, Aleksei V, MD [address] [place] [address] [phone]  Call  to schedule an appointment for close follow up    This chart was dictated using voice recognition software.  Despite best efforts to proofread,  errors can occur which can change the documentation meaning.    Trine Raynell Moder, MD 03/19/24 [phone]

## 2024-03-19 NOTE — ED Triage Notes (Signed)
 Per EMS from home. C/o chills and malaise, otherwise stable. Dx UTI, today. Family wants re-evaluation.

## 2024-03-21 LAB — URINE CULTURE: Culture: >=100000 — AB

## 2024-03-22 ENCOUNTER — Encounter: Payer: Self-pay | Admitting: Internal Medicine

## 2024-03-22 ENCOUNTER — Telehealth (HOSPITAL_BASED_OUTPATIENT_CLINIC_OR_DEPARTMENT_OTHER): Payer: Self-pay | Admitting: *Deleted

## 2024-03-22 ENCOUNTER — Ambulatory Visit: Admitting: Internal Medicine

## 2024-03-22 VITALS — BP 122/70 | HR 65 | Temp 98.6°F | Ht 66.0 in | Wt 198.0 lb

## 2024-03-22 DIAGNOSIS — R55 Syncope and collapse: Secondary | ICD-10-CM

## 2024-03-22 DIAGNOSIS — N390 Urinary tract infection, site not specified: Secondary | ICD-10-CM

## 2024-03-22 DIAGNOSIS — N184 Chronic kidney disease, stage 4 (severe): Secondary | ICD-10-CM | POA: Diagnosis not present

## 2024-03-22 NOTE — Assessment & Plan Note (Signed)
 Worse due to a UTI and dehydration S/p two ER visit on 03/19/24: better on the Abx and after IVF

## 2024-03-22 NOTE — Assessment & Plan Note (Addendum)
 Due to a UTI and dehydration S/p two ER visit on 03/19/24: better on the Abx and after IVF Better

## 2024-03-22 NOTE — Progress Notes (Signed)
 Subjective:  Patient ID: Christine Morales, female    DOB: 1945/07/23  Age: 78 y.o. MRN: [id number]  CC: Hospitalization Follow-up (Patient states right arm inner bicep pain she wants looked at. )   HPI Christine Morales presents for UTI, near syncope, dehydration f/u S/p two ER visit on 03/19/24:better on the Abx and after IVF  Outpatient Medications Prior to Visit  Medication Sig Dispense Refill   acetaminophen (TYLENOL) 500 MG tablet Take 1,000 mg by mouth 2 (two) times daily as needed for headache.     amLODipine  (NORVASC ) 5 MG tablet TAKE 1 TABLET(5 MG) BY MOUTH DAILY 90 tablet 3   Aspirin Effervescent (ALKA-SELTZER ORIGINAL PO) Take 1 tablet by mouth daily as needed (body aches).     cephALEXin (KEFLEX) 500 MG capsule Take 1 capsule (500 mg total) by mouth 3 (three) times daily for 7 days. 21 capsule 0   Cholecalciferol (VITAMIN D ) 50 MCG (2000 UT) tablet Take 1 tablet (2,000 Units total) by mouth daily. 90 tablet 3   cyanocobalamin  (VITAMIN B12) 1000 MCG tablet Take 1 tablet (1,000 mcg total) by mouth daily. 90 tablet 1   ferrous sulfate  325 (65 FE) MG tablet Take 1 tablet (325 mg total) by mouth daily. 90 tablet 1   losartan  (COZAAR ) 100 MG tablet TAKE 1 TABLET(100 MG) BY MOUTH DAILY 90 tablet 3   meloxicam  (MOBIC ) 15 MG tablet Take 15 mg by mouth daily.     Multiple Vitamin (MULTIVITAMIN WITH MINERALS) TABS tablet Take 1 tablet by mouth daily. Geritol     ondansetron  (ZOFRAN -ODT) 4 MG disintegrating tablet Take 1 tablet (4 mg total) by mouth every 8 (eight) hours as needed for up to 3 days for nausea or vomiting. 15 tablet 0   cetirizine  (ZYRTEC ) 5 MG tablet Take 1 tablet (5 mg total) by mouth daily. (Patient not taking: Reported on 03/22/2024) 14 tablet 0   famotidine  (PEPCID ) 10 MG tablet Take 1 tablet (10 mg total) by mouth at bedtime. (Patient not taking: Reported on 03/22/2024) 14 tablet 0   levothyroxine  (SYNTHROID ) 100 MCG tablet TAKE 1 TABLET BY MOUTH EVERY DAY (Patient not taking:  Reported on 03/22/2024) 90 tablet 3   traMADol  (ULTRAM ) 50 MG tablet Take 1 tablet (50 mg total) by mouth every 6 (six) hours as needed. (Patient not taking: Reported on 03/22/2024) 20 tablet 1   triamcinolone  cream (KENALOG ) 0.1 % Apply 1 Application topically 2 (two) times daily. (Patient not taking: Reported on 03/22/2024) 30 g 0   Facility-Administered Medications Prior to Visit  Medication Dose Route Frequency Provider Last Rate Last Admin   heparin  lock flush 100 unit/mL  500 Units Intravenous Once Shadad, Firas N, MD       sodium chloride  0.9 % injection 10 mL  10 mL Intravenous PRN Shadad, Firas N, MD       sodium chloride  0.9 % injection 10 mL  10 mL Intravenous PRN Shadad, Firas N, MD   10 mL at 05/20/17 0956    ROS: Review of Systems  Constitutional:  Positive for fatigue. Negative for activity change, appetite change, chills and unexpected weight change.  HENT:  Negative for congestion, mouth sores and sinus pressure.   Eyes:  Negative for visual disturbance.  Respiratory:  Negative for cough and chest tightness.   Gastrointestinal:  Negative for abdominal pain and nausea.  Genitourinary:  Negative for decreased urine volume, difficulty urinating, frequency, hematuria and vaginal pain.  Musculoskeletal:  Negative for back pain and  gait problem.  Skin:  Negative for pallor and rash.  Neurological:  Negative for dizziness, tremors, weakness, numbness and headaches.  Psychiatric/Behavioral:  Negative for confusion and sleep disturbance.     Objective:  BP 122/70   Pulse 65   Temp 98.6 F (37 C) (Oral)   Ht 5' 6 (1.676 m)   Wt 198 lb (89.8 kg)   SpO2 96%   BMI 31.96 kg/m   BP Readings from Last 3 Encounters:  03/22/24 122/70  03/19/24 121/61  03/19/24 129/64    Wt Readings from Last 3 Encounters:  03/22/24 198 lb (89.8 kg)  01/25/24 201 lb (91.2 kg)  01/22/24 201 lb (91.2 kg)    Physical Exam Constitutional:      General: She is not in acute distress.     Appearance: She is well-developed. She is obese.  HENT:     Head: Normocephalic.     Right Ear: External ear normal.     Left Ear: External ear normal.     Nose: Nose normal.  Eyes:     General:        Right eye: No discharge.        Left eye: No discharge.     Conjunctiva/sclera: Conjunctivae normal.     Pupils: Pupils are equal, round, and reactive to light.  Neck:     Thyroid : No thyromegaly.     Vascular: No JVD.     Trachea: No tracheal deviation.  Cardiovascular:     Rate and Rhythm: Normal rate and regular rhythm.     Heart sounds: Normal heart sounds.  Pulmonary:     Effort: No respiratory distress.     Breath sounds: No stridor. No wheezing.  Abdominal:     General: Bowel sounds are normal. There is no distension.     Palpations: Abdomen is soft. There is no mass.     Tenderness: There is no abdominal tenderness. There is no guarding or rebound.  Musculoskeletal:        General: No tenderness.     Cervical back: Normal range of motion and neck supple. No rigidity.  Lymphadenopathy:     Cervical: No cervical adenopathy.  Skin:    Findings: No erythema or rash.  Neurological:     Cranial Nerves: No cranial nerve deficit.     Motor: No abnormal muscle tone.     Coordination: Coordination normal.     Deep Tendon Reflexes: Reflexes normal.  Psychiatric:        Behavior: Behavior normal.        Thought Content: Thought content normal.        Judgment: Judgment normal.     Lab Results  Component Value Date   WBC 10.1 03/19/2024   HGB 13.0 03/19/2024   HCT 40.9 03/19/2024   PLT 199 03/19/2024   GLUCOSE 120 (H) 03/19/2024   CHOL 197 09/18/2022   TRIG 108.0 09/18/2022   HDL 61.10 09/18/2022   LDLCALC 114 (H) 09/18/2022   ALT 8 03/19/2024   AST 19 03/19/2024   NA 140 03/19/2024   K 4.5 03/19/2024   CL 105 03/19/2024   CREATININE 1.95 (H) 03/19/2024   BUN 26 (H) 03/19/2024   CO2 23 03/19/2024   TSH 0.74 03/19/2023   INR 0.94 12/15/2017   HGBA1C 6.1  07/20/2023    DG Chest Port 1 View Result Date: 03/19/2024 CLINICAL DATA:  78 year old female with sudden onset fever and chills. EXAM: PORTABLE CHEST 1 VIEW COMPARISON:  Portable chest 01/06/2021 and earlier. FINDINGS: Portable AP semi upright view at 0522 hours. Chronic double density at the central diaphragm, lower mediastinum suggesting a gastric hiatal hernia. This appears moderate and stable since 2022. Other mediastinal contours are within normal limits. Visualized tracheal air column is within normal limits. Lung volumes are within normal limits. Allowing for portable technique the lungs are clear. No pneumothorax or pleural effusion. No acute osseous abnormality identified. IMPRESSION: 1. No acute cardiopulmonary abnormality. 2. Moderate chronic gastric hiatal hernia suspected. Electronically Signed   By: VEAR Hurst M.D.   On: 03/19/2024 05:56    Assessment & Plan:   Problem List Items Addressed This Visit     CRF (chronic renal failure)   Worse due to a UTI and dehydration S/p two ER visit on 03/19/24: better on the Abx and after IVF      Near syncope - Primary   Due to a UTI and dehydration S/p two ER visit on 03/19/24: better on the Abx and after IVF Better      UTI (urinary tract infection)    UTI and dehydration S/p two ER visit on 03/19/24: better on the Abx and after IVF On Keflex po: E coli sensitive to abx         No orders of the defined types were placed in this encounter.     Follow-up: Return in about 3 months (around 06/21/2024) for a follow-up visit.  Marolyn Noel, MD

## 2024-03-22 NOTE — Assessment & Plan Note (Addendum)
 UTI and dehydration S/p two ER visit on 03/19/24: better on the Abx and after IVF On Keflex po: E coli sensitive to abx

## 2024-03-22 NOTE — Progress Notes (Signed)
 ED Antimicrobial Stewardship Positive Culture Follow Up   Christine Morales is an 78 y.o. female who presented to [facility] on 03/19/2024 with a chief complaint of chills & fever. EMS temp 100.5F with no documented fevers in ED. She did not endorse any urinary symptoms. WBC within normal limits.    Chief Complaint  Patient presents with   Fever    Recent Results (from the past 720 hours)  Resp panel by RT-PCR (RSV, Flu A&B, Covid) Anterior Nasal Swab     Status: None   Collection Time: 03/19/24  4:51 AM   Specimen: Anterior Nasal Swab  Result Value Ref Range Status   SARS Coronavirus 2 by RT PCR NEGATIVE NEGATIVE Final   Influenza A by PCR NEGATIVE NEGATIVE Final   Influenza B by PCR NEGATIVE NEGATIVE Final   Resp Syncytial Virus by PCR NEGATIVE NEGATIVE Final    Comment: Performed at [facility], [address], [place], [place] [address]  Urine Culture     Status: Abnormal   Collection Time: 03/19/24  6:24 AM   Specimen: Urine, Random  Result Value Ref Range Status   Specimen Description   Final    URINE, RANDOM Performed at [facility], [address] [place], [place] [address]    Special Requests   Final    NONE Reflexed from [id number] Performed at [facility], [address] [place], [place] [address]    Culture >=100,000 COLONIES/mL ESCHERICHIA COLI (A)  Final   Report Status 03/21/2024 FINAL  Final   Organism ID, Bacteria ESCHERICHIA COLI (A)  Final      Susceptibility   Escherichia coli - MIC*    AMPICILLIN 4 SENSITIVE Sensitive     CEFAZOLIN  (URINE) Value in next row Sensitive      <=1 SENSITIVEThis is a modified FDA-approved test that has been validated and its performance characteristics determined by the reporting laboratory.  This laboratory is certified under the Clinical Laboratory Improvement Amendments CLIA as qualified to perform high complexity clinical laboratory testing.    CEFEPIME Value in next row  Sensitive      <=1 SENSITIVEThis is a modified FDA-approved test that has been validated and its performance characteristics determined by the reporting laboratory.  This laboratory is certified under the Clinical Laboratory Improvement Amendments CLIA as qualified to perform high complexity clinical laboratory testing.    ERTAPENEM Value in next row Sensitive      <=1 SENSITIVEThis is a modified FDA-approved test that has been validated and its performance characteristics determined by the reporting laboratory.  This laboratory is certified under the Clinical Laboratory Improvement Amendments CLIA as qualified to perform high complexity clinical laboratory testing.    CEFTRIAXONE Value in next row Sensitive      <=1 SENSITIVEThis is a modified FDA-approved test that has been validated and its performance characteristics determined by the reporting laboratory.  This laboratory is certified under the Clinical Laboratory Improvement Amendments CLIA as qualified to perform high complexity clinical laboratory testing.    CIPROFLOXACIN  Value in next row Sensitive      <=1 SENSITIVEThis is a modified FDA-approved test that has been validated and its performance characteristics determined by the reporting laboratory.  This laboratory is certified under the Clinical Laboratory Improvement Amendments CLIA as qualified to perform high complexity clinical laboratory testing.    GENTAMICIN Value in next row Sensitive      <=1 SENSITIVEThis is a modified FDA-approved test that has been validated  and its performance characteristics determined by the reporting laboratory.  This laboratory is certified under the Clinical Laboratory Improvement Amendments CLIA as qualified to perform high complexity clinical laboratory testing.    NITROFURANTOIN Value in next row Sensitive      <=1 SENSITIVEThis is a modified FDA-approved test that has been validated and its performance characteristics determined by the reporting  laboratory.  This laboratory is certified under the Clinical Laboratory Improvement Amendments CLIA as qualified to perform high complexity clinical laboratory testing.    TRIMETH/SULFA Value in next row Sensitive      <=1 SENSITIVEThis is a modified FDA-approved test that has been validated and its performance characteristics determined by the reporting laboratory.  This laboratory is certified under the Clinical Laboratory Improvement Amendments CLIA as qualified to perform high complexity clinical laboratory testing.    AMPICILLIN/SULBACTAM Value in next row Sensitive      <=1 SENSITIVEThis is a modified FDA-approved test that has been validated and its performance characteristics determined by the reporting laboratory.  This laboratory is certified under the Clinical Laboratory Improvement Amendments CLIA as qualified to perform high complexity clinical laboratory testing.    PIP/TAZO Value in next row Sensitive      <=4 SENSITIVEThis is a modified FDA-approved test that has been validated and its performance characteristics determined by the reporting laboratory.  This laboratory is certified under the Clinical Laboratory Improvement Amendments CLIA as qualified to perform high complexity clinical laboratory testing.    MEROPENEM Value in next row Sensitive      <=4 SENSITIVEThis is a modified FDA-approved test that has been validated and its performance characteristics determined by the reporting laboratory.  This laboratory is certified under the Clinical Laboratory Improvement Amendments CLIA as qualified to perform high complexity clinical laboratory testing.    * >=100,000 COLONIES/mL ESCHERICHIA COLI    [x]  Treated with Keflex for 7 days. Conduct symptom check: -If patient reports improvement in symptoms, stop Keflex on Day 5 (10/1).  -If patient does not report improvement, can complete full 7 day course.  ED Provider: Carlo Cornish, PA-C   Dionicia Canavan, PharmD, RPh PGY1 Acute Care  Pharmacy Resident [facility]  03/22/2024 8:25 AM  Monday - Friday phone -  [phone] Saturday - Sunday phone - [phone]

## 2024-03-22 NOTE — Telephone Encounter (Signed)
 Post ED Visit - Positive Culture Follow-up: Unsuccessful Patient Follow-up  Culture assessed and recommendations reviewed by:  []  Rankin Dee, Pharm.D. []  Venetia Gully, Pharm.D., BCPS AQ-ID []  Garrel Crews, Pharm.D., BCPS []  Almarie Lunger, Pharm.D., BCPS []  [place], [address].D., BCPS, AAHIVP []  Rosaline Bihari, Pharm.D., BCPS, AAHIVP []  Massie Rigg, PharmD []  Jodie Rower, PharmD, BCPS  Positive urine culture reviewed by Carlo Cornish and she recommend stopping cephalexin on day 5.  []  Patient discharged without antimicrobial prescription and treatment is now indicated []  Organism is resistant to prescribed ED discharge antimicrobial []  Patient with positive blood cultures   Unable to contact patient after 3 attempts, letter will be sent to address on file  Christine Morales 03/22/2024, 1:51 PM

## 2024-04-15 DIAGNOSIS — E039 Hypothyroidism, unspecified: Secondary | ICD-10-CM | POA: Diagnosis not present

## 2024-04-15 DIAGNOSIS — N2581 Secondary hyperparathyroidism of renal origin: Secondary | ICD-10-CM | POA: Diagnosis not present

## 2024-04-15 DIAGNOSIS — N184 Chronic kidney disease, stage 4 (severe): Secondary | ICD-10-CM | POA: Diagnosis not present

## 2024-04-15 DIAGNOSIS — I129 Hypertensive chronic kidney disease with stage 1 through stage 4 chronic kidney disease, or unspecified chronic kidney disease: Secondary | ICD-10-CM | POA: Diagnosis not present

## 2024-04-15 DIAGNOSIS — Z8249 Family history of ischemic heart disease and other diseases of the circulatory system: Secondary | ICD-10-CM | POA: Diagnosis not present

## 2024-04-15 DIAGNOSIS — H269 Unspecified cataract: Secondary | ICD-10-CM | POA: Diagnosis not present

## 2024-04-15 DIAGNOSIS — Z6830 Body mass index (BMI) 30.0-30.9, adult: Secondary | ICD-10-CM | POA: Diagnosis not present

## 2024-04-15 DIAGNOSIS — R7303 Prediabetes: Secondary | ICD-10-CM | POA: Diagnosis not present

## 2024-04-15 DIAGNOSIS — E669 Obesity, unspecified: Secondary | ICD-10-CM | POA: Diagnosis not present

## 2024-04-19 ENCOUNTER — Ambulatory Visit: Admitting: Podiatry

## 2024-05-10 ENCOUNTER — Ambulatory Visit: Admitting: Podiatry

## 2024-05-13 DIAGNOSIS — Z1231 Encounter for screening mammogram for malignant neoplasm of breast: Secondary | ICD-10-CM | POA: Diagnosis not present

## 2024-05-13 LAB — HM MAMMOGRAPHY

## 2024-05-31 ENCOUNTER — Ambulatory Visit: Admitting: Podiatry

## 2024-06-23 ENCOUNTER — Encounter: Payer: Self-pay | Admitting: Oncology

## 2024-06-30 ENCOUNTER — Ambulatory Visit: Admitting: Internal Medicine

## 2024-06-30 ENCOUNTER — Encounter: Payer: Self-pay | Admitting: Internal Medicine

## 2024-06-30 VITALS — BP 126/80 | HR 73 | Temp 98.2°F | Ht 66.0 in | Wt 201.0 lb

## 2024-06-30 DIAGNOSIS — Z13220 Encounter for screening for lipoid disorders: Secondary | ICD-10-CM | POA: Diagnosis not present

## 2024-06-30 DIAGNOSIS — E559 Vitamin D deficiency, unspecified: Secondary | ICD-10-CM | POA: Diagnosis not present

## 2024-06-30 DIAGNOSIS — Z23 Encounter for immunization: Secondary | ICD-10-CM | POA: Diagnosis not present

## 2024-06-30 DIAGNOSIS — Z0000 Encounter for general adult medical examination without abnormal findings: Secondary | ICD-10-CM | POA: Diagnosis not present

## 2024-06-30 DIAGNOSIS — R7309 Other abnormal glucose: Secondary | ICD-10-CM

## 2024-06-30 DIAGNOSIS — E538 Deficiency of other specified B group vitamins: Secondary | ICD-10-CM | POA: Diagnosis not present

## 2024-06-30 LAB — CBC WITH DIFFERENTIAL/PLATELET
Basophils Absolute: 0 K/uL (ref 0.0–0.1)
Basophils Relative: 0.6 % (ref 0.0–3.0)
Eosinophils Absolute: 0.1 K/uL (ref 0.0–0.7)
Eosinophils Relative: 2.2 % (ref 0.0–5.0)
HCT: 38.2 % (ref 36.0–46.0)
Hemoglobin: 12.5 g/dL (ref 12.0–15.0)
Lymphocytes Relative: 49.3 % — ABNORMAL HIGH (ref 12.0–46.0)
Lymphs Abs: 2.7 K/uL (ref 0.7–4.0)
MCHC: 32.8 g/dL (ref 30.0–36.0)
MCV: 85.1 fl (ref 78.0–100.0)
Monocytes Absolute: 0.5 K/uL (ref 0.1–1.0)
Monocytes Relative: 8.2 % (ref 3.0–12.0)
Neutro Abs: 2.2 K/uL (ref 1.4–7.7)
Neutrophils Relative %: 39.7 % — ABNORMAL LOW (ref 43.0–77.0)
Platelets: 250 K/uL (ref 150.0–400.0)
RBC: 4.49 Mil/uL (ref 3.87–5.11)
RDW: 14.8 % (ref 11.5–15.5)
WBC: 5.5 K/uL (ref 4.0–10.5)

## 2024-06-30 LAB — COMPREHENSIVE METABOLIC PANEL WITH GFR
ALT: 13 U/L (ref 3–35)
AST: 18 U/L (ref 5–37)
Albumin: 4.2 g/dL (ref 3.5–5.2)
Alkaline Phosphatase: 78 U/L (ref 39–117)
BUN: 25 mg/dL — ABNORMAL HIGH (ref 6–23)
CO2: 29 meq/L (ref 19–32)
Calcium: 9.9 mg/dL (ref 8.4–10.5)
Chloride: 105 meq/L (ref 96–112)
Creatinine, Ser: 1.64 mg/dL — ABNORMAL HIGH (ref 0.40–1.20)
GFR: 30.27 mL/min — ABNORMAL LOW
Glucose, Bld: 93 mg/dL (ref 70–99)
Potassium: 4.7 meq/L (ref 3.5–5.1)
Sodium: 140 meq/L (ref 135–145)
Total Bilirubin: 0.5 mg/dL (ref 0.2–1.2)
Total Protein: 6.9 g/dL (ref 6.0–8.3)

## 2024-06-30 LAB — LIPID PANEL
Cholesterol: 195 mg/dL (ref 28–200)
HDL: 66.2 mg/dL
LDL Cholesterol: 108 mg/dL — ABNORMAL HIGH (ref 10–99)
NonHDL: 128.86
Total CHOL/HDL Ratio: 3
Triglycerides: 103 mg/dL (ref 10.0–149.0)
VLDL: 20.6 mg/dL (ref 0.0–40.0)

## 2024-06-30 LAB — TSH: TSH: 0.59 u[IU]/mL (ref 0.35–5.50)

## 2024-06-30 LAB — HEMOGLOBIN A1C: Hgb A1c MFr Bld: 6 % (ref 4.6–6.5)

## 2024-06-30 LAB — VITAMIN D 25 HYDROXY (VIT D DEFICIENCY, FRACTURES): VITD: 69.9 ng/mL (ref 30.00–100.00)

## 2024-06-30 LAB — VITAMIN B12: Vitamin B-12: 533 pg/mL (ref 211–911)

## 2024-06-30 NOTE — Progress Notes (Signed)
 "  Subjective:  Patient ID: Christine Morales, female    DOB: 1946/06/12  Age: 79 y.o. MRN: [id number]  CC: Follow-up (States no questions or concerns to discuss. )   HPI Christine Morales presents for a well exam  Outpatient Medications Prior to Visit  Medication Sig Dispense Refill   acetaminophen (TYLENOL) 500 MG tablet Take 1,000 mg by mouth 2 (two) times daily as needed for headache.     amLODipine  (NORVASC ) 5 MG tablet TAKE 1 TABLET(5 MG) BY MOUTH DAILY 90 tablet 3   Aspirin Effervescent (ALKA-SELTZER ORIGINAL PO) Take 1 tablet by mouth daily as needed (body aches).     Cholecalciferol (VITAMIN D ) 50 MCG (2000 UT) tablet Take 1 tablet (2,000 Units total) by mouth daily. 90 tablet 3   cyanocobalamin  (VITAMIN B12) 1000 MCG tablet Take 1 tablet (1,000 mcg total) by mouth daily. 90 tablet 1   ferrous sulfate  325 (65 FE) MG tablet Take 1 tablet (325 mg total) by mouth daily. 90 tablet 1   losartan  (COZAAR ) 100 MG tablet TAKE 1 TABLET(100 MG) BY MOUTH DAILY 90 tablet 3   meloxicam  (MOBIC ) 15 MG tablet Take 15 mg by mouth daily.     Multiple Vitamin (MULTIVITAMIN WITH MINERALS) TABS tablet Take 1 tablet by mouth daily. Geritol     cetirizine  (ZYRTEC ) 5 MG tablet Take 1 tablet (5 mg total) by mouth daily. (Patient not taking: Reported on 03/22/2024) 14 tablet 0   famotidine  (PEPCID ) 10 MG tablet Take 1 tablet (10 mg total) by mouth at bedtime. (Patient not taking: Reported on 03/22/2024) 14 tablet 0   levothyroxine  (SYNTHROID ) 100 MCG tablet TAKE 1 TABLET BY MOUTH EVERY DAY (Patient not taking: Reported on 03/22/2024) 90 tablet 3   traMADol  (ULTRAM ) 50 MG tablet Take 1 tablet (50 mg total) by mouth every 6 (six) hours as needed. (Patient not taking: Reported on 03/22/2024) 20 tablet 1   triamcinolone  cream (KENALOG ) 0.1 % Apply 1 Application topically 2 (two) times daily. (Patient not taking: Reported on 03/22/2024) 30 g 0   Facility-Administered Medications Prior to Visit  Medication Dose Route Frequency  Provider Last Rate Last Admin   heparin  lock flush 100 unit/mL  500 Units Intravenous Once Shadad, Firas N, MD       sodium chloride  0.9 % injection 10 mL  10 mL Intravenous PRN Amadeo Windell SAILOR, MD       sodium chloride  0.9 % injection 10 mL  10 mL Intravenous PRN Shadad, Firas N, MD   10 mL at 05/20/17 0956    ROS: Review of Systems  Constitutional:  Negative for activity change, appetite change, chills, fatigue and unexpected weight change.  HENT:  Negative for congestion, mouth sores and sinus pressure.   Eyes:  Negative for visual disturbance.  Respiratory:  Negative for cough and chest tightness.   Gastrointestinal:  Negative for abdominal pain and nausea.  Genitourinary:  Negative for difficulty urinating, frequency and vaginal pain.  Musculoskeletal:  Positive for arthralgias. Negative for back pain and gait problem.  Skin:  Negative for pallor and rash.  Neurological:  Negative for dizziness, tremors, weakness, numbness and headaches.  Psychiatric/Behavioral:  Negative for confusion and sleep disturbance.     Objective:  BP 126/80   Pulse 73   Temp 98.2 F (36.8 C) (Oral)   Ht 5' 6 (1.676 m)   Wt 201 lb (91.2 kg)   SpO2 96%   BMI 32.44 kg/m   BP Readings from Last 3  Encounters:  06/30/24 126/80  03/22/24 122/70  03/19/24 121/61    Wt Readings from Last 3 Encounters:  06/30/24 201 lb (91.2 kg)  03/22/24 198 lb (89.8 kg)  01/25/24 201 lb (91.2 kg)    Physical Exam Constitutional:      General: She is not in acute distress.    Appearance: She is well-developed. She is obese.  HENT:     Head: Normocephalic.     Right Ear: External ear normal.     Left Ear: External ear normal.     Nose: Nose normal.  Eyes:     General:        Right eye: No discharge.        Left eye: No discharge.     Conjunctiva/sclera: Conjunctivae normal.     Pupils: Pupils are equal, round, and reactive to light.  Neck:     Thyroid : No thyromegaly.     Vascular: No JVD.      Trachea: No tracheal deviation.  Cardiovascular:     Rate and Rhythm: Normal rate and regular rhythm.     Heart sounds: Normal heart sounds.  Pulmonary:     Effort: No respiratory distress.     Breath sounds: No stridor. No wheezing.  Abdominal:     General: Bowel sounds are normal. There is no distension.     Palpations: Abdomen is soft. There is no mass.     Tenderness: There is no abdominal tenderness. There is no guarding or rebound.  Musculoskeletal:        General: No tenderness.     Cervical back: Normal range of motion and neck supple. No rigidity.  Lymphadenopathy:     Cervical: No cervical adenopathy.  Skin:    Findings: No erythema or rash.  Neurological:     Cranial Nerves: No cranial nerve deficit.     Motor: No abnormal muscle tone.     Coordination: Coordination normal.     Deep Tendon Reflexes: Reflexes normal.  Psychiatric:        Behavior: Behavior normal.        Thought Content: Thought content normal.        Judgment: Judgment normal.     Lab Results  Component Value Date   WBC 10.1 03/19/2024   HGB 13.0 03/19/2024   HCT 40.9 03/19/2024   PLT 199 03/19/2024   GLUCOSE 120 (H) 03/19/2024   CHOL 197 09/18/2022   TRIG 108.0 09/18/2022   HDL 61.10 09/18/2022   LDLCALC 114 (H) 09/18/2022   ALT 8 03/19/2024   AST 19 03/19/2024   NA 140 03/19/2024   K 4.5 03/19/2024   CL 105 03/19/2024   CREATININE 1.95 (H) 03/19/2024   BUN 26 (H) 03/19/2024   CO2 23 03/19/2024   TSH 0.74 03/19/2023   INR 0.94 12/15/2017   HGBA1C 6.1 07/20/2023    DG Chest Port 1 View Result Date: 03/19/2024 CLINICAL DATA:  79 year old female with sudden onset fever and chills. EXAM: PORTABLE CHEST 1 VIEW COMPARISON:  Portable chest 01/06/2021 and earlier. FINDINGS: Portable AP semi upright view at 0522 hours. Chronic double density at the central diaphragm, lower mediastinum suggesting a gastric hiatal hernia. This appears moderate and stable since 2022. Other mediastinal contours  are within normal limits. Visualized tracheal air column is within normal limits. Lung volumes are within normal limits. Allowing for portable technique the lungs are clear. No pneumothorax or pleural effusion. No acute osseous abnormality identified. IMPRESSION: 1. No acute cardiopulmonary abnormality.  2. Moderate chronic gastric hiatal hernia suspected. Electronically Signed   By: VEAR Hurst M.D.   On: 03/19/2024 05:56    Assessment & Plan:   Problem List Items Addressed This Visit     B12 deficiency   On B12 Labs      Relevant Orders   Vitamin B12   Well adult exam - Primary    We discussed age appropriate health related issues, including available/recomended screening tests and vaccinations. Labs were ordered to be later reviewed . All questions were answered. We discussed one or more of the following - seat belt use, use of sunscreen/sun exposure exercise, safe sex, fall risk reduction, second hand smoke exposure, firearm use and storage, seat belt use, a need for adhering to healthy diet and exercise. Labs were ordered. All questions were answered. GYN, BDS, mammo - per Dr Prentiss, GYN Last colon 2021 - Dr Abran Flu shot A cardiac CT scan for calcium scoring offered      Relevant Orders   TSH   Urinalysis   CBC with Differential/Platelet   Lipid panel   Comprehensive metabolic panel with GFR   Vitamin B12   VITAMIN D  25 Hydroxy (Vit-D Deficiency, Fractures)   Hemoglobin A1c   Elevated glucose   Other Visit Diagnoses       Immunization due       Relevant Orders   Flu vaccine HIGH DOSE PF(Fluzone Trivalent) (Completed)   Hemoglobin A1c     Vitamin D  deficiency       Relevant Orders   VITAMIN D  25 Hydroxy (Vit-D Deficiency, Fractures)         No orders of the defined types were placed in this encounter.     Follow-up: Return in about 6 months (around 12/28/2024) for a follow-up visit.  Marolyn Noel, MD "

## 2024-06-30 NOTE — Assessment & Plan Note (Signed)
On B12 Labs 

## 2024-06-30 NOTE — Assessment & Plan Note (Addendum)
" °  We discussed age appropriate health related issues, including available/recomended screening tests and vaccinations. Labs were ordered to be later reviewed . All questions were answered. We discussed one or more of the following - seat belt use, use of sunscreen/sun exposure exercise, safe sex, fall risk reduction, second hand smoke exposure, firearm use and storage, seat belt use, a need for adhering to healthy diet and exercise. Labs were ordered. All questions were answered. GYN, BDS, mammo - per Dr Prentiss, GYN Last colon 2021 - Dr Abran Flu shot A cardiac CT scan for calcium scoring offered "

## 2024-07-01 LAB — URINALYSIS, ROUTINE W REFLEX MICROSCOPIC
Bilirubin Urine: NEGATIVE
Hgb urine dipstick: NEGATIVE
Ketones, ur: NEGATIVE
Nitrite: NEGATIVE
Specific Gravity, Urine: 1.01 (ref 1.000–1.030)
Total Protein, Urine: NEGATIVE
Urine Glucose: NEGATIVE
Urobilinogen, UA: 0.2 (ref 0.0–1.0)
pH: 7 (ref 5.0–8.0)

## 2024-07-10 ENCOUNTER — Ambulatory Visit: Payer: Self-pay | Admitting: Internal Medicine

## 2024-07-25 ENCOUNTER — Ambulatory Visit: Admitting: Internal Medicine

## 2024-12-28 ENCOUNTER — Ambulatory Visit: Admitting: Internal Medicine

## 2025-01-30 ENCOUNTER — Ambulatory Visit
